# Patient Record
Sex: Female | Born: 1975
Health system: Southern US, Community
[De-identification: ages and names within clinical notes are randomized; demographics above are authoritative.]

## PROBLEM LIST (undated history)

## (undated) DIAGNOSIS — Z9109 Other allergy status, other than to drugs and biological substances: Secondary | ICD-10-CM

## (undated) DIAGNOSIS — K760 Fatty (change of) liver, not elsewhere classified: Secondary | ICD-10-CM

## (undated) DIAGNOSIS — Z79899 Other long term (current) drug therapy: Secondary | ICD-10-CM

## (undated) DIAGNOSIS — F329 Major depressive disorder, single episode, unspecified: Secondary | ICD-10-CM

## (undated) DIAGNOSIS — K219 Gastro-esophageal reflux disease without esophagitis: Secondary | ICD-10-CM

## (undated) DIAGNOSIS — F32A Depression, unspecified: Secondary | ICD-10-CM

## (undated) DIAGNOSIS — G8929 Other chronic pain: Secondary | ICD-10-CM

## (undated) DIAGNOSIS — A048 Other specified bacterial intestinal infections: Secondary | ICD-10-CM

## (undated) DIAGNOSIS — E1151 Type 2 diabetes mellitus with diabetic peripheral angiopathy without gangrene: Secondary | ICD-10-CM

## (undated) DIAGNOSIS — E7800 Pure hypercholesterolemia, unspecified: Secondary | ICD-10-CM

## (undated) DIAGNOSIS — I10 Essential (primary) hypertension: Secondary | ICD-10-CM

## (undated) DIAGNOSIS — E782 Mixed hyperlipidemia: Secondary | ICD-10-CM

## (undated) DIAGNOSIS — K2970 Gastritis, unspecified, without bleeding: Secondary | ICD-10-CM

## (undated) DIAGNOSIS — E559 Vitamin D deficiency, unspecified: Secondary | ICD-10-CM

## (undated) DIAGNOSIS — G4730 Sleep apnea, unspecified: Secondary | ICD-10-CM

## (undated) DIAGNOSIS — D573 Sickle-cell trait: Secondary | ICD-10-CM

## (undated) DIAGNOSIS — F413 Other mixed anxiety disorders: Secondary | ICD-10-CM

## (undated) DIAGNOSIS — I70209 Unspecified atherosclerosis of native arteries of extremities, unspecified extremity: Secondary | ICD-10-CM

## (undated) HISTORY — PX: APPENDECTOMY: SHX54

## (undated) HISTORY — DX: Sleep apnea, unspecified: G47.30

## (undated) HISTORY — DX: Vitamin D deficiency, unspecified: E55.9

## (undated) HISTORY — DX: Type 2 diabetes mellitus with diabetic peripheral angiopathy without gangrene: I70.209

## (undated) HISTORY — DX: Other mixed anxiety disorders: F41.3

## (undated) HISTORY — DX: Depression, unspecified: F32.A

## (undated) HISTORY — DX: Other specified bacterial intestinal infections: A04.8

## (undated) HISTORY — DX: Other chronic pain: G89.29

## (undated) HISTORY — DX: Fatty (change of) liver, not elsewhere classified: K76.0

## (undated) HISTORY — DX: Mixed hyperlipidemia: E78.2

## (undated) HISTORY — DX: Gastritis, unspecified, without bleeding: K29.70

## (undated) HISTORY — DX: Sickle-cell trait: D57.3

## (undated) HISTORY — DX: Type 2 diabetes mellitus with diabetic peripheral angiopathy without gangrene: E11.51

## (undated) HISTORY — DX: Pure hypercholesterolemia, unspecified: E78.00

## (undated) HISTORY — DX: Major depressive disorder, single episode, unspecified: F32.9

## (undated) HISTORY — DX: Gastro-esophageal reflux disease without esophagitis: K21.9

## (undated) HISTORY — PX: ABDOMINAL HYSTERECTOMY: SHX81

## (undated) HISTORY — DX: Other long term (current) drug therapy: Z79.899

## (undated) HISTORY — DX: Other allergy status, other than to drugs and biological substances: Z91.09

## (undated) HISTORY — DX: Essential (primary) hypertension: I10

---

## 1999-07-26 ENCOUNTER — Emergency Department (HOSPITAL_COMMUNITY): Admission: EM | Admit: 1999-07-26 | Discharge: 1999-07-26 | Payer: Self-pay | Admitting: Emergency Medicine

## 2006-11-06 ENCOUNTER — Ambulatory Visit: Payer: Self-pay | Admitting: *Deleted

## 2006-11-06 ENCOUNTER — Inpatient Hospital Stay (HOSPITAL_COMMUNITY): Admission: AD | Admit: 2006-11-06 | Discharge: 2006-11-10 | Payer: Self-pay | Admitting: *Deleted

## 2008-01-28 DIAGNOSIS — K2970 Gastritis, unspecified, without bleeding: Secondary | ICD-10-CM

## 2008-01-28 HISTORY — DX: Gastritis, unspecified, without bleeding: K29.70

## 2008-08-16 HISTORY — PX: COLONOSCOPY: SHX174

## 2008-08-16 HISTORY — PX: ESOPHAGOGASTRODUODENOSCOPY: SHX1529

## 2009-12-11 ENCOUNTER — Emergency Department (HOSPITAL_COMMUNITY): Admission: EM | Admit: 2009-12-11 | Discharge: 2009-12-11 | Payer: Self-pay | Admitting: Emergency Medicine

## 2010-04-09 LAB — DIFFERENTIAL
Eosinophils Absolute: 0.1 10*3/uL (ref 0.0–0.7)
Eosinophils Relative: 1 % (ref 0–5)
Lymphocytes Relative: 28 % (ref 12–46)
Lymphs Abs: 2.9 10*3/uL (ref 0.7–4.0)
Monocytes Relative: 4 % (ref 3–12)

## 2010-04-09 LAB — ETHANOL: Alcohol, Ethyl (B): <5 mg/dL (ref 0–10)

## 2010-04-09 LAB — URINALYSIS, ROUTINE W REFLEX MICROSCOPIC
Glucose, UA: NEGATIVE mg/dL
Hgb urine dipstick: NEGATIVE
Protein, ur: NEGATIVE mg/dL
Specific Gravity, Urine: 1.018 (ref 1.005–1.030)
Urobilinogen, UA: 0.2 mg/dL (ref 0.0–1.0)

## 2010-04-09 LAB — COMPREHENSIVE METABOLIC PANEL
ALT: 32 U/L (ref 0–35)
AST: 36 U/L (ref 0–37)
Calcium: 9.7 mg/dL (ref 8.4–10.5)
Creatinine, Ser: 0.66 mg/dL (ref 0.4–1.2)
GFR calc Af Amer: >60 mL/min (ref >60)
Sodium: 140 mEq/L (ref 135–145)
Total Protein: 7.7 g/dL (ref 6.0–8.3)

## 2010-04-09 LAB — RAPID URINE DRUG SCREEN, HOSP PERFORMED
Amphetamines: NOT DETECTED
Benzodiazepines: POSITIVE — AB
Cocaine: NOT DETECTED
Opiates: POSITIVE — AB
Tetrahydrocannabinol: POSITIVE — AB

## 2010-04-09 LAB — GLUCOSE, CAPILLARY: Glucose-Capillary: 158 mg/dL — ABNORMAL HIGH (ref 70–99)

## 2010-04-09 LAB — SALICYLATE LEVEL: Salicylate Lvl: <4 mg/dL (ref 2.8–20.0)

## 2010-04-09 LAB — CBC
MCHC: 34.4 g/dL (ref 30.0–36.0)
Platelets: 193 10*3/uL (ref 150–400)
RDW: 14.8 % (ref 11.5–15.5)

## 2010-04-09 LAB — URINE CULTURE

## 2010-04-09 LAB — ACETAMINOPHEN LEVEL: Acetaminophen (Tylenol), Serum: <10 ug/mL — ABNORMAL LOW (ref 10–30)

## 2010-04-09 LAB — TRICYCLICS SCREEN, URINE: TCA Scrn: NOT DETECTED
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
## 2010-06-14 NOTE — Discharge Summary (Signed)
NAMEKEAUNA, [name]:  [id number]   MEDICAL RECORD NO.:  [id number]          PATIENT TYPE:  IPS   LOCATION:  0603                          FACILITY:  [facility]   PHYSICIAN:  Sara [name], M.D. DATE OF BIRTH:  1975/08/09   DATE OF ADMISSION:  11/06/2006  DATE OF DISCHARGE:  11/10/2006                               DISCHARGE SUMMARY   IDENTIFYING INFORMATION:  This is a 35 year old divorced African  American female who was admitted on an involuntary basis on November 06, 2006.   HISTORY OF PRESENT ILLNESS:  The patient presented to the emergency  department at [facility] on the day before admission.  She was  actively suicidal.  She stated I have anxiety attacks, a bad attitude,  mood changes day to day, I need disability to get off marijuana, I do  not eat or sleep without marijuana, on talk to myself I hear no answer.  The father of her children has been in prison for the past 9-1/2 years  secondary to murder.  He is due to be released in November.  She wants  the father to take the children so she can leave.  She says I do not  get along with nobody, I am hateful.  She was currently living with her  parents and the only income she has is 106 dollars a month.  This is  child support for her 2 sons age 68 and 48.  She apparently takes care  for everyone.  She does cooking, cleaning, driving etc.  Although, she  currently has an upcoming court date on November 10, 2006, due to  speeding and there are issues regarding her driving.  She has had  admissions to [facility], February 1994, November  1995, and February 1999.  She has been in and out of [facility].  She has had hospitalizations in 1994, [facility] [place]  for a week that was for depression with suicidal ideation and fears of  harming her 61-year-old son.  She has been hospitalized 2 to 3 times at  [facility] for depression and suicidal ideation wanting  to hurt  others.  Her last hospitalization was 2006.  She has had some history of  cocaine abuse, longest period off cannabis is 1 week.  She has had a  history of taking Depakote.  It was irregularly and she had seizures.  Seroquel, Zoloft, Paxil, and Lexapro have been tried in the past.  The  patient's mother is bipolar as is maternal grandmother.  There is also a  family history of substance abuse.  She has asthma, gastroesophageal  reflux disease, arthritis, sick as cell trait, and she status post an  appendectomy and hysterectomy.  She states that she has been told she  has gout.  She is allergic to PENICILLIN, AMOXICILLIN and FLAGYL.  She  has no currently prescribed medications as she stopped everything.   PHYSICAL FINDINGS:  The patient was medically cleared in the ED at  [facility]  There were no acute physical or medical problems  noted.  Her UDS was positive for marijuana.  Other admission  laboratories were done in the ED and evaluated by the ED physician.   HOSPITAL COURSE:  Upon admission, the patient was continued on Pepcid 20  mg p.o. b.i.d.  She was also started on Cymbalta 20 mg p.o. q.a.m. and  q.h.s. and Abilify 10 mg at bedtime.  Motrin 800 mg q. 6h. p.r.n. pain.  On November 08, 2006, she was started on Seroquel 50 mg now then 50 mg  p.o. q. 4h. p.r.n. anxiety.  On November 09, 2006, Cymbalta was increased  to 30 mg p.o. b.i.d.  Also on November 09, 2006, she was started on  albuterol nebulizer q. 4h. p.r.n. allergy symptoms and Vistaril 25 mg q.  6h. p.r.n. allergic symptoms.  The patient tolerated these medications  well with no significant side effects.  Initially in session with me,  she was reserved with poor eye contact and psychomotor retardation.  Speech was soft and slow.  Her mood was angry and irritable.  She did  not want to participate at first in unit therapeutic groups and  activities, but this improved as hospitalization progressed.  She  stated  she has not been on regular medications.  She has been taking Xanax and  pain pills that belong to her mother.  She used to be Lexapro and  Seroquel.  She stated she stays with her mother and has conflict there.  She smokes marijuana regularly.  She wants long-term rehab to get off  the marijuana.  As hospitalization progressed, mood swings were still a  problem.  She was having difficulty sleeping.  She was irritable with  the peer and felt he was deliberately aggravating her.  By November 09, 2006, her mood was euthymic, affect wide range, she felt much better,  she was interested in going home, she did not want to look at a long-  term treatment program after Cymbalta was increased to 30 mg p.o. b.i.d.  as indicated above.  On November 10, 2006, mental status had improved  markedly from admission status.  The patient was friendly and  cooperative, good eye contact.  Speech was normal rate and flow.  Psychomotor activity was within normal limits.  Mood was euthymic.  Affect wide range.  There was no suicidal or homicidal ideation.  No  thoughts of self-injurious behavior.  No auditory or visual  hallucinations.  No paranoia or delusions.  Thoughts were logical and  goal-directed.  Thought content no predominant theme.  Cognitive was  grossly back to baseline.  The patient was felt ready to be discharged.  She planned to return home and have follow up at [facility].   DISCHARGE DIAGNOSES:  AXIS I:  Mood disorder not otherwise specified  also cannabis dependence.  AXIS II:  Borderline personality disorder.  AXIS III:  Asthma, gastroesophageal reflux disease, arthritis, rule out  gout.  AXIS IV: Severe (problems primary support group; problems with  education, occupation, housing, economics, and legal issues; burden of  psychiatric illness; burden of medical illness).  AXIS V:  Global  assessment of functioning was 50 upon discharge.  Global assessment of   functioning was 18 upon admission.  Global assessment of functioning  highest past year was 55-60.   DISCHARGE/PLANS:  There were no specific activity level or dietary  restrictions.   POST HOSPITAL CARE PLANS:  The patient will be seen at [facility] November 11, 2006, at 9 o'clock a.m.   DISCHARGE MEDICATIONS:  1. The patient was discharged on Cymbalta 30 mg p.o. b.i.d.  2. Abilify 10 mg at bedtime.  3. Ambien 10 mg at bedtime p.r.n. insomnia.  4. Cogentin 0.5 mg p.o. b.i.d. p.r.n.      Sara [name], M.D.  Electronically Signed     BHS/MEDQ  D:  12/07/2006  T:  12/08/2006  Job:  [id number]

## 2013-05-03 ENCOUNTER — Encounter: Payer: Self-pay | Admitting: Gastroenterology

## 2013-06-21 ENCOUNTER — Encounter: Payer: Self-pay | Admitting: Gastroenterology

## 2013-06-24 ENCOUNTER — Ambulatory Visit: Payer: Self-pay | Admitting: Gastroenterology

## 2013-08-03 ENCOUNTER — Encounter: Payer: Self-pay | Admitting: *Deleted

## 2013-08-04 ENCOUNTER — Encounter: Payer: Self-pay | Admitting: Nurse Practitioner

## 2013-08-04 ENCOUNTER — Ambulatory Visit (INDEPENDENT_AMBULATORY_CARE_PROVIDER_SITE_OTHER): Payer: Medicaid Other | Admitting: Nurse Practitioner

## 2013-08-04 VITALS — BP 102/76 | HR 84 | Ht 65.0 in | Wt 199.2 lb

## 2013-08-04 DIAGNOSIS — R112 Nausea with vomiting, unspecified: Secondary | ICD-10-CM

## 2013-08-04 DIAGNOSIS — R109 Unspecified abdominal pain: Secondary | ICD-10-CM

## 2013-08-04 NOTE — Patient Instructions (Signed)
We made you an appointment with Willette ClusterPaula Guenther NP-C on 08-17-2013 at 10 am.  We [facility].

## 2013-08-05 ENCOUNTER — Encounter: Payer: Self-pay | Admitting: Nurse Practitioner

## 2013-08-05 DIAGNOSIS — R109 Unspecified abdominal pain: Secondary | ICD-10-CM | POA: Insufficient documentation

## 2013-08-05 DIAGNOSIS — R112 Nausea with vomiting, unspecified: Secondary | ICD-10-CM | POA: Insufficient documentation

## 2013-08-05 NOTE — Progress Notes (Addendum)
HPI :  Patient is a 38 year old female, new to this practice, referred by PCP in [place] for evaluation of nausea, vomiting and sharp upper abdominal pain. Patient tells me she has chronic upper abdominal pain which radiates around both sides towards the back. She has apparently been.by a gastroenterologist in [place], I do not have results. Patient describes EGD and colonoscopy a few years back. She does not want to return to the gastroenterologist there. Patient also reports being hospitalized in [facility] in last week for the same symptoms. She describes a CT scan and a barium study, I do not have those results. Patient has a history of significant weight loss, 36 pounds in the last 2 months. She began having problems with nausea and vomiting 2 months ago. Prior to that no significant nausea vomiting except for back a few years back when she had workup in [place], [place].  Patient is status post appendectomy and total hysterectomy. She takes NSAIDs as needed but note from PCP states patient takes a daily Goody powder. She is on a daily PPI and takes phenergan for nausea.    Past Medical History  Diagnosis Date  . Hyperlipidemia, mixed   . Hypercholesterolemia   . Hypertension   . High risk medication use   . Chronic pain   . 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 Acid reflux   . Vitamin D deficiency   . Sleep apnea   . Sickle cell trait   . Pollen allergies   . Depression   . Other mixed anxiety disorders     Family History  Problem Relation Age of Onset  . Clotting disorder Mother   . Crohn's disease Mother   . Heart disease Mother   . Colon cancer Father   . Clotting disorder Brother   . Diabetes Brother   . Diabetes Maternal Aunt   . Liver cancer Maternal Uncle   . Prostate cancer Maternal Uncle   . Colon cancer Maternal Uncle   . Diabetes Paternal Aunt   . Esophageal cancer Paternal Uncle    History  Substance Use Topics  . Smoking  status: Current Every Day Smoker -- 1.00 packs/day    Types: Cigarettes  . Smokeless tobacco: Never Used  . Alcohol Use: No   Current Outpatient Prescriptions  Medication Sig Dispense Refill  . ALPRAZolam (XANAX) 1 MG tablet Take 1 mg by mouth at bedtime as needed for anxiety.      . Dapagliflozin Propanediol (FARXIGA) 10 MG TABS Take by mouth daily.      Marland Kitchen gabapentin (NEURONTIN) 600 MG tablet Take 600 mg by mouth 3 (three) times daily.      Marland Kitchen glucose blood test strip 1 each by Other route as needed for other. Use as instructed      . Insulin Glargine (LANTUS SOLOSTAR) 100 UNIT/ML Solostar Pen Inject into the skin daily at 10 pm. 10 units daily      . Insulin Pen Needle (PEN NEEDLES 29GX1/2") 29G X MISC by Does not apply route.      . Lancets (ACCU-CHEK SOFT TOUCH) lancets 1 each by Other route as needed for other. Use as instructed      . Naproxen Sodium (ALEVE) 220 MG CAPS Take 1 capsule by mouth daily. Take 2 caps 4 times daily as needed.      Marland Kitchen oxycodone (OXY-IR) 5 MG capsule Take 5 mg by mouth every 4 (four) hours as needed.      Marland Kitchen  pantoprazole (PROTONIX) 40 MG tablet Take 40 mg by mouth daily.      . promethazine (PHENERGAN) 25 MG tablet Take 25 mg by mouth every 6 (six) hours as needed for nausea or vomiting.      . sucralfate (CARAFATE) 1 GM/10ML suspension Take 1 g by mouth 4 (four) times daily -  with meals and at bedtime.      . traMADol (ULTRAM) 50 MG tablet Take 50 mg by mouth every 6 (six) hours as needed.       No current facility-administered medications for this visit.   Allergies  Allergen Reactions  . Metronidazole   . Penicillins   . Tetracyclines & Related   . Tylenol [Acetaminophen]   . Doxycycline Rash     Review of Systems: Positive for back pain, breast changes, vision changes, cough, fatigue, night sweats, headaches, sleeping problems, excessive thirst, and excessive urination. All other systems reviewed and negative except where noted in HPI.    Physical Exam: BP 102/76  Pulse 84  Ht 5\' 5"  (1.651 m)  Wt 199 lb 3.2 oz (90.357 kg)  BMI 33.15 kg/m2 Constitutional: Pleasant, obese black female in no acute distress. HEENT: Normocephalic and atraumatic. Conjunctivae are normal. No scleral icterus. Neck supple.  Cardiovascular: Normal rate, regular rhythm.  Pulmonary/chest: Effort normal and breath sounds normal. No wheezing, rales or rhonchi. Abdominal: Soft, obese, exaggerated response to even light palpation anywhere over abdomen. Bowel sounds active throughout. There are no masses palpable.  Extremities: no edema Lymphadenopathy: No cervical adenopathy noted. Neurological: Alert and oriented to person place and time. Skin: Skin is warm and dry. No rashes noted. Psychiatric: Normal mood and affect. Behavior is normal.   ASSESSMENT AND PLAN:  38 year old female with multiple medical problems, on multiple medications. She has chronic abdominal pain, evaluated by GI in [facility] a few years back. Patient does not want to return to the gastroenterology clinic in [place]. Patient was  hospitalized recently [facility] for abdominal pain, nausea, vomiting. She apparently had an inpatient CTscan and a barium study but I do not have any records. She has chronic GI issues and we need to have records to evaluate her. She is in no acute distress. Will await records and bring her back to go over everything.   Addendum: Reviewed and agree with initial management. Beverley FiedlerJay M Pyrtle, MD

## 2013-08-10 ENCOUNTER — Encounter: Payer: Self-pay | Admitting: *Deleted

## 2013-08-17 ENCOUNTER — Ambulatory Visit: Payer: Medicaid Other | Admitting: Nurse Practitioner

## 2013-08-31 ENCOUNTER — Ambulatory Visit (INDEPENDENT_AMBULATORY_CARE_PROVIDER_SITE_OTHER): Payer: Medicaid Other | Admitting: Nurse Practitioner

## 2013-08-31 ENCOUNTER — Encounter: Payer: Self-pay | Admitting: Nurse Practitioner

## 2013-08-31 VITALS — BP 118/80 | HR 94 | Ht 62.5 in | Wt 197.0 lb

## 2013-08-31 DIAGNOSIS — R109 Unspecified abdominal pain: Secondary | ICD-10-CM

## 2013-08-31 DIAGNOSIS — R1013 Epigastric pain: Secondary | ICD-10-CM

## 2013-08-31 MED ORDER — PANTOPRAZOLE SODIUM 40 MG PO TBEC: 40.0000 mg | DELAYED_RELEASE_TABLET | Freq: Every day | ORAL | Status: DC

## 2013-08-31 MED ORDER — SUCRALFATE 1 GM/10ML PO SUSP: 1.0000 g | Freq: Three times a day (TID) | ORAL | Status: DC

## 2013-08-31 NOTE — Patient Instructions (Addendum)
You have been given a separate informational sheet regarding your tobacco use, the importance of quitting and local resources to help you quit. You have been scheduled for an endoscopy. Please follow written instructions given to you at your visit today. If you use inhalers (even only as needed), please bring them with you on the day of your procedure. Your physician has requested that you go to www.startemmi.com and enter the access code given to you at your visit today. This web site gives a general overview about your procedure. However, you should still follow specific instructions given to you by our office regarding your preparation for the procedure.  We sent refills for the Carafate suspemsion to [facility], [place], [place]. Take the Protonix 40 mg 1 tab daily before breakfast.

## 2013-09-01 ENCOUNTER — Encounter: Payer: Self-pay | Admitting: Nurse Practitioner

## 2013-09-01 NOTE — Progress Notes (Addendum)
HPI :   Patient is a 38 year old female, new to this practice. Patient is in a few weeks ago for evaluation of abdominal pain but I have no records from previous gastroenterologist so she was rescheduled.  Patient was hospitalized at [facility] late last month for upper abdominal pain, nausea , vomiting. She had been taking NSAIDs . There was a question of hematemesis but patient reported having had a red slushy watermelon. Her hemoglobin was normal in the 16 range. On admission her white count was elevated however to 13.6. LFTs were normal. A CT scan of the abdomen and pelvis with contrast done 07/24/13 was unremarkable.  Patient was discharged home on Carafate and protonix.  In 2010 patient was seen by gastroenterologist in [place] for nausea and epigastric pain. EGD was normal. She also had a colonoscopy which was also unremarkable. Overall she had okay from a gastrointestinal standpoint until approximately 3 months ago when she developed this nausea and epigastric pain. Patient had been taking NSAIDs prior to the onset of pain. Patient is frustrated that she cannot get any help with this abdominal pain. She is tired of being miserable in pain. Patient would like a refill on her Carafate and Protonix.    Past Medical History  Diagnosis Date  . Hyperlipidemia, mixed   . Hypercholesterolemia   . Hypertension   . High risk medication use   . Chronic pain   . 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 Acid reflux   . Vitamin D deficiency   . Sleep apnea   . Sickle cell trait   . Pollen allergies   . Depression   . Other mixed anxiety disorders   . Gastritis 2010    Family History  Problem Relation Age of Onset  . Clotting disorder Mother   . Crohn's disease Mother   . Heart disease Mother   . Colon cancer Father   . Clotting disorder Brother   . Diabetes Brother   . Diabetes Maternal Aunt   . Liver cancer Maternal Uncle   . Prostate cancer  Maternal Uncle   . Colon cancer Maternal Uncle   . Diabetes Paternal Aunt   . Esophageal cancer Paternal Uncle    History  Substance Use Topics  . Smoking status: Current Every Day Smoker -- 1.00 packs/day    Types: Cigarettes  . Smokeless tobacco: Never Used  . Alcohol Use: No   Current Outpatient Prescriptions  Medication Sig Dispense Refill  . ALPRAZolam (XANAX) 1 MG tablet Take 1 mg by mouth at bedtime as needed for anxiety.      . Dapagliflozin Propanediol (FARXIGA) 10 MG TABS Take by mouth daily.      Marland Kitchen gabapentin (NEURONTIN) 600 MG tablet Take 600 mg by mouth 3 (three) times daily.      Marland Kitchen glucose blood test strip 1 each by Other route as needed for other. Use as instructed      . Insulin Glargine (LANTUS SOLOSTAR) 100 UNIT/ML Solostar Pen Inject into the skin daily at 10 pm. 10 units daily      . Insulin Pen Needle (PEN NEEDLES 29GX1/2") 29G X MISC by Does not apply route.      . Lancets (ACCU-CHEK SOFT TOUCH) lancets 1 each by Other route as needed for other. Use as instructed      . Naproxen Sodium (ALEVE) 220 MG CAPS Take 1 capsule by mouth daily. Take 2 caps 4 times daily as  needed.      Marland Kitchen. oxycodone (OXY-IR) 5 MG capsule Take 5 mg by mouth every 4 (four) hours as needed.      . pantoprazole (PROTONIX) 40 MG tablet Take 1 tablet (40 mg total) by mouth daily.  30 tablet  2  . promethazine (PHENERGAN) 25 MG tablet Take 25 mg by mouth every 6 (six) hours as needed for nausea or vomiting.      . sucralfate (CARAFATE) 1 GM/10ML suspension Take 10 mLs (1 g total) by mouth 4 (four) times daily -  with meals and at bedtime.  420 mL  2  . traMADol (ULTRAM) 50 MG tablet Take 50 mg by mouth every 6 (six) hours as needed.       No current facility-administered medications for this visit.   Allergies  Allergen Reactions  . Metronidazole   . Penicillins   . Tetracyclines & Related   . Tylenol [Acetaminophen]   . Doxycycline Rash    Review of Systems: All systems reviewed and  negative except where noted in HPI.   Physical Exam: BP 118/80  Pulse 94  Ht 5' 2.5" (1.588 m)  Wt 197 lb (89.359 kg)  BMI 35.44 kg/m2 Constitutional: well-developed, black female in no acute distress. HEENT: Normocephalic and atraumatic. Conjunctivae are normal. No scleral icterus. Neck supple.  Cardiovascular: Normal rate, regular rhythm.  Pulmonary/chest: Effort normal and breath sounds normal. No wheezing, rales or rhonchi. Abdominal: Soft, nondistended, mild epigastric and right upper quadrant tenderness. Bowel sounds active throughout. There are no masses palpable. No hepatomegaly. Extremities: no edema Lymphadenopathy: No cervical adenopathy noted. Neurological: Alert and oriented to person place and time. Skin: Skin is warm and dry. No rashes noted. Psychiatric: agitated.   ASSESSMENT AND PLAN:  631. 38 year old female with epigastric pain and nausea for 3 months duration. CT [facility] and [facility] last month. Her white count however was elevated at 13. Patient had been taking NSAIDs prior to onset of this pain. Etiology of pain unclear, possibly functional. Rule out peptic ulcer disease. Will schedule her for an upper endoscopy for further evaluation. The benefits, risks, and potential complications of EGD with possible biopsies were discussed with the patient and she agrees to proceed. Gallbladder unremarkable CT scan of EGD is negative she may need an ultrasound. Will refill PPI and Carafate per patient's request  2. Diabetes / sleep apnea / hypertension  Addendum: Reviewed and agree with initial management. Beverley FiedlerJay M Pyrtle, MD

## 2013-09-12 ENCOUNTER — Ambulatory Visit: Payer: Self-pay | Admitting: Gastroenterology

## 2013-10-04 ENCOUNTER — Encounter: Payer: Self-pay | Admitting: Gastroenterology

## 2013-10-04 ENCOUNTER — Ambulatory Visit (AMBULATORY_SURGERY_CENTER): Payer: Medicaid Other | Admitting: Gastroenterology

## 2013-10-04 VITALS — BP 159/107 | HR 94 | Temp 97.1°F | Resp 37 | Ht 62.5 in | Wt 197.0 lb

## 2013-10-04 DIAGNOSIS — K2990 Gastroduodenitis, unspecified, without bleeding: Secondary | ICD-10-CM

## 2013-10-04 DIAGNOSIS — K2960 Other gastritis without bleeding: Secondary | ICD-10-CM

## 2013-10-04 DIAGNOSIS — A048 Other specified bacterial intestinal infections: Secondary | ICD-10-CM

## 2013-10-04 DIAGNOSIS — R1013 Epigastric pain: Secondary | ICD-10-CM

## 2013-10-04 DIAGNOSIS — K2970 Gastritis, unspecified, without bleeding: Secondary | ICD-10-CM

## 2013-10-04 LAB — GLUCOSE, CAPILLARY
GLUCOSE-CAPILLARY: 184 mg/dL — AB (ref 70–99)
Glucose-Capillary: 201 mg/dL — ABNORMAL HIGH (ref 70–99)

## 2013-10-04 MED ORDER — SODIUM CHLORIDE 0.9 % IV SOLN: 500.0000 mL | INTRAVENOUS | Status: DC

## 2013-10-04 NOTE — Progress Notes (Signed)
Called to room to assist during endoscopic procedure.  Patient ID and intended procedure confirmed with present staff. Received instructions for my participation in the procedure from the performing physician.  

## 2013-10-04 NOTE — Patient Instructions (Signed)
YOU HAD AN ENDOSCOPIC PROCEDURE TODAY AT THE [facility]: Refer to the procedure report that was given to you for any specific questions about what was found during the examination.  If the procedure report does not answer your questions, please call your gastroenterologist to clarify.  If you requested that your care partner not be given the details of your procedure findings, then the procedure report has been included in a sealed envelope for you to review at your convenience later.  YOU SHOULD EXPECT: Some feelings of bloating in the abdomen. Passage of more gas than usual.  Walking can help get rid of the air that was put into your GI tract during the procedure and reduce the bloating.   DIET: Your first meal following the procedure should be a light meal and then it is ok to progress to your normal diet.  A half-sandwich or bowl of soup is an example of a good first meal.  Heavy or fried foods are harder to digest and may make you feel nauseous or bloated.  Likewise meals heavy in dairy and vegetables can cause extra gas to form and this can also increase the bloating.  Drink plenty of fluids but you should avoid alcoholic beverages for 24 hours.  ACTIVITY: Your care partner should take you home directly after the procedure.  You should plan to take it easy, moving slowly for the rest of the day.  You can resume normal activity the day after the procedure however you should NOT DRIVE or use heavy machinery for 24 hours (because of the sedation medicines used during the test).    SYMPTOMS TO REPORT IMMEDIATELY: A gastroenterologist can be reached at any hour.  During normal business hours, 8:30 AM to 5:00 PM Monday through Friday, call [phone].  After hours and on weekends, please call the GI answering service at [phone] who will take a message and have the physician on call contact you.   Following upper endoscopy (EGD)  Vomiting of blood or coffee ground material  New  chest pain or pain under the shoulder blades  Painful or persistently difficult swallowing  New shortness of breath  Fever of 100F or higher  Black, tarry-looking stools  FOLLOW UP: If any biopsies were taken you will be contacted by phone or by letter within the next 1-3 weeks.  Call your gastroenterologist if you have not heard about the biopsies in 3 weeks.  Our staff will call the home number listed on your records the next business day following your procedure to check on you and address any questions or concerns that you may have at that time regarding the information given to you following your procedure. This is a courtesy call and so if there is no answer at the home number and we have not heard from you through the emergency physician on call, we will assume that you have returned to your regular daily activities without incident.  SIGNATURES/CONFIDENTIALITY: You and/or your care partner have signed paperwork which will be entered into your electronic medical record.  These signatures attest to the fact that that the information above on your After Visit Summary has been reviewed and is understood.  Full responsibility of the confidentiality of this discharge information lies with you and/or your care-partner.  Await pathology  Continue your normal medications  Please read over handouts about gastritis and hiatal hernias  Please call Dr. Lauro Franklin office in the next few days to set up a follow up  appointment for 4 weeks

## 2013-10-04 NOTE — Progress Notes (Signed)
Report to PACU, RN, vss, BBS= Clear.  

## 2013-10-04 NOTE — Op Note (Signed)
[facility] [address] [place], [address]   ENDOSCOPY PROCEDURE REPORT  PATIENT: Sara, [name]  MR#: [id number] BIRTHDATE: Mar 14, 1975 , 37  yrs. old GENDER: Female ENDOSCOPIST: Meryl Dare, MD, [facility] PROCEDURE DATE:  10/04/2013 PROCEDURE:  EGD w/ biopsy ASA CLASS:     Class III INDICATIONS:  Epigastric pain. MEDICATIONS: MAC sedation, administered by CRNA and propofol (Diprivan)  IV TOPICAL ANESTHETIC: none DESCRIPTION OF PROCEDURE: After the risks benefits and alternatives of the procedure were thoroughly explained, informed consent was obtained.  The LB [id number] endoscope was introduced through the mouth and advanced to the second portion of the duodenum. Without limitations.  The instrument was slowly withdrawn as the mucosa was fully examined.    ESOPHAGUS: Normal mucosa was found. STOMACH: Mild, non erosive gastritis was found in the gastric body and gastric antrum.  Multiple biopsies were performed.   The stomach otherwise appeared normal. DUODENUM: The duodenal mucosa showed no abnormalities in the bulb and second portion of the duodenum.  Retroflexed views revealed a small hiatal hernia.     The scope was then withdrawn from the patient and the procedure completed.  COMPLICATIONS: There were no complications.  ENDOSCOPIC IMPRESSION: 1.    Gastritis in the gastric body and gastric antrum; multiple biopsies 2.    Small hiatal hernia  RECOMMENDATIONS: 1.  Await pathology results 2.  Continue PPI and Carafate 3.  OP follow-up in 4 weeks with Dr. Rhea Belton  eSigned:  Meryl Dare, MD, [facility] 10/04/2013 3:18 PM

## 2013-10-05 ENCOUNTER — Telehealth: Payer: Self-pay | Admitting: *Deleted

## 2013-10-05 NOTE — Telephone Encounter (Signed)
  Follow up Call-  Call back number 10/04/2013  Post procedure Call Back phone  # [phone]  Permission to leave phone message Yes     Patient questions:  Do you have a fever, pain , or abdominal swelling? No. Pain Score  0 *  Have you tolerated food without any problems? Yes.    Have you been able to return to your normal activities? Yes.    Do you have any questions about your discharge instructions: Diet   No. Medications  No. Follow up visit  No.  Do you have questions or concerns about your Care? No.  Actions: * If pain score is 4 or above: No action needed, pain <4.

## 2013-10-10 ENCOUNTER — Other Ambulatory Visit: Payer: Self-pay

## 2013-10-10 MED ORDER — CLARITHROMYCIN 500 MG PO TABS
500.0000 mg | ORAL_TABLET | Freq: Three times a day (TID) | ORAL | Status: DC
Start: 2013-10-10 — End: 2022-01-30

## 2013-10-10 MED ORDER — OMEPRAZOLE 20 MG PO CPDR: DELAYED_RELEASE_CAPSULE | ORAL | Status: DC

## 2013-10-15 ENCOUNTER — Telehealth: Payer: Self-pay | Admitting: Internal Medicine

## 2013-10-15 DIAGNOSIS — A048 Other specified bacterial intestinal infections: Secondary | ICD-10-CM

## 2013-10-15 NOTE — Telephone Encounter (Signed)
Severe itching since starting clarithromycin No rash but intense sxs bothering her  Advised she stop clarithromycin and we will call her 9/21 with updated plans

## 2013-10-16 NOTE — Telephone Encounter (Signed)
Will leave this decision her follow up appt with Dr. Rhea Belton. If it is not set up please arrange.

## 2013-10-17 NOTE — Telephone Encounter (Signed)
Dr. Rhea Belton patient had EGD with Dr. Russella Dar( apparent scheduling error), she has H. Pylori and was started on clarithromycin 500 mg TID and omeprazole BID.  She has an allergy to PCN, metronidazole, and tetracycline. Called this weekend with intense itching from clarithromycin.  See notes below.  She has follow up with you in November.  Please advise on h pylori treatment or do you want her seen earlier in an overbook spot?

## 2013-10-17 NOTE — Telephone Encounter (Signed)
No, not okay for amox. Would refer to ID for help with antibiotic choices Very difficult to select treatment

## 2013-10-17 NOTE — Telephone Encounter (Signed)
She has allergies to nearly every possible H. pylori combination We could try twice daily PPI, bismuth subsalicylate 525 mg 4 times daily, and amoxicillin 1 g twice a day for 10 days Confirmation of eradication with H. pylori stool antigen off PPI is recommended 6 weeks after therapy

## 2013-10-17 NOTE — Telephone Encounter (Signed)
She is allergic to PCN sure that is ok??

## 2013-10-18 NOTE — Telephone Encounter (Signed)
Patient notified that she will be contacted by ID for an appt

## 2013-10-18 NOTE — Addendum Note (Signed)
Addended by: Annett Fabian on: 10/18/2013 09:15 AM   Modules accepted: Orders

## 2013-10-18 NOTE — Telephone Encounter (Signed)
Referral placed.  °Left message for patient to call back. ° °

## 2013-10-19 NOTE — Telephone Encounter (Signed)
Patient is scheduled to see Dr. Ninetta Lights with ID.  Appt was scheduled by ID with the patient

## 2013-10-27 ENCOUNTER — Ambulatory Visit: Payer: Medicaid Other | Admitting: Internal Medicine

## 2013-11-07 ENCOUNTER — Ambulatory Visit: Payer: Medicaid Other | Admitting: Infectious Diseases

## 2013-11-28 DIAGNOSIS — B9681 Helicobacter pylori [H. pylori] as the cause of diseases classified elsewhere: Secondary | ICD-10-CM | POA: Insufficient documentation

## 2013-11-28 DIAGNOSIS — K2970 Gastritis, unspecified, without bleeding: Principal | ICD-10-CM

## 2013-11-29 ENCOUNTER — Ambulatory Visit: Payer: Medicaid Other | Admitting: Internal Medicine

## 2013-11-29 ENCOUNTER — Telehealth: Payer: Self-pay | Admitting: *Deleted

## 2013-11-29 NOTE — Telephone Encounter (Signed)
Pt does not [place] transportation [place] [facility] [place] MD consult.

## 2013-11-29 NOTE — Telephone Encounter (Signed)
[place] [facility].  Nothing any closer to [facility].

## 2013-11-29 NOTE — Telephone Encounter (Signed)
All I can do is recommend ID consult given multiple antibiotic allergies and history of H. Pylori infection which needs to be treated

## 2013-11-29 NOTE — Telephone Encounter (Signed)
Is there an option for her to see ID closer to home for help with abx for h pylori?

## 2013-11-30 NOTE — Telephone Encounter (Signed)
Do you want her referred to ID at [facility]?

## 2013-11-30 NOTE — Telephone Encounter (Signed)
Left message for patient to call back  

## 2013-11-30 NOTE — Telephone Encounter (Signed)
Yes if she is willing and able to go

## 2013-12-01 NOTE — Telephone Encounter (Signed)
Left message for patient to call back  

## 2013-12-02 NOTE — Telephone Encounter (Signed)
No return call from the patient.  I have left another message asking her to call and discuss possible ID appt at [facility].

## 2013-12-09 ENCOUNTER — Encounter: Payer: Self-pay | Admitting: *Deleted

## 2013-12-15 ENCOUNTER — Ambulatory Visit: Payer: Medicaid Other | Admitting: Internal Medicine

## 2019-11-11 DIAGNOSIS — I119 Hypertensive heart disease without heart failure: Secondary | ICD-10-CM

## 2019-11-11 DIAGNOSIS — R079 Chest pain, unspecified: Secondary | ICD-10-CM

## 2019-11-11 DIAGNOSIS — E119 Type 2 diabetes mellitus without complications: Secondary | ICD-10-CM

## 2019-11-11 DIAGNOSIS — F17200 Nicotine dependence, unspecified, uncomplicated: Secondary | ICD-10-CM

## 2020-03-06 ENCOUNTER — Encounter (HOSPITAL_BASED_OUTPATIENT_CLINIC_OR_DEPARTMENT_OTHER): Payer: Self-pay | Admitting: Emergency Medicine

## 2020-03-06 ENCOUNTER — Other Ambulatory Visit: Payer: Self-pay

## 2020-03-06 ENCOUNTER — Emergency Department (HOSPITAL_BASED_OUTPATIENT_CLINIC_OR_DEPARTMENT_OTHER): Payer: Medicaid Other

## 2020-03-06 ENCOUNTER — Emergency Department (HOSPITAL_BASED_OUTPATIENT_CLINIC_OR_DEPARTMENT_OTHER)
Admission: EM | Admit: 2020-03-06 | Discharge: 2020-03-06 | Disposition: A | Discharge status: Home / Self Care | Payer: Medicaid Other | Attending: Emergency Medicine | Admitting: Emergency Medicine

## 2020-03-06 ENCOUNTER — Other Ambulatory Visit (HOSPITAL_BASED_OUTPATIENT_CLINIC_OR_DEPARTMENT_OTHER): Payer: Self-pay | Admitting: Emergency Medicine

## 2020-03-06 DIAGNOSIS — E1151 Type 2 diabetes mellitus with diabetic peripheral angiopathy without gangrene: Secondary | ICD-10-CM | POA: Diagnosis not present

## 2020-03-06 DIAGNOSIS — R110 Nausea: Secondary | ICD-10-CM | POA: Insufficient documentation

## 2020-03-06 DIAGNOSIS — Z794 Long term (current) use of insulin: Secondary | ICD-10-CM | POA: Diagnosis not present

## 2020-03-06 DIAGNOSIS — R197 Diarrhea, unspecified: Secondary | ICD-10-CM | POA: Diagnosis not present

## 2020-03-06 DIAGNOSIS — A599 Trichomoniasis, unspecified: Secondary | ICD-10-CM | POA: Diagnosis not present

## 2020-03-06 DIAGNOSIS — I10 Essential (primary) hypertension: Secondary | ICD-10-CM | POA: Diagnosis not present

## 2020-03-06 DIAGNOSIS — R000 Tachycardia, unspecified: Secondary | ICD-10-CM | POA: Insufficient documentation

## 2020-03-06 DIAGNOSIS — F17210 Nicotine dependence, cigarettes, uncomplicated: Secondary | ICD-10-CM | POA: Diagnosis not present

## 2020-03-06 DIAGNOSIS — R1032 Left lower quadrant pain: Secondary | ICD-10-CM | POA: Diagnosis present

## 2020-03-06 DIAGNOSIS — E1165 Type 2 diabetes mellitus with hyperglycemia: Secondary | ICD-10-CM | POA: Insufficient documentation

## 2020-03-06 LAB — CBC WITH DIFFERENTIAL/PLATELET
Abs Immature Granulocytes: 0.03 10*3/uL (ref 0.00–0.07)
Basophils Absolute: 0 10*3/uL (ref 0.0–0.1)
Basophils Relative: 0 %
Eosinophils Absolute: 0 10*3/uL (ref 0.0–0.5)
Eosinophils Relative: 0 %
HCT: 44.6 % (ref 36.0–46.0)
Hemoglobin: 15.4 g/dL — ABNORMAL HIGH (ref 12.0–15.0)
Immature Granulocytes: 0 %
Lymphocytes Relative: 10 %
Lymphs Abs: 0.9 10*3/uL (ref 0.7–4.0)
MCH: 29 pg (ref 26.0–34.0)
MCHC: 34.5 g/dL (ref 30.0–36.0)
MCV: 84 fL (ref 80.0–100.0)
Monocytes Absolute: 0.4 10*3/uL (ref 0.1–1.0)
Monocytes Relative: 5 %
Neutro Abs: 8.1 10*3/uL — ABNORMAL HIGH (ref 1.7–7.7)
Neutrophils Relative %: 85 %
Platelets: 189 10*3/uL (ref 150–400)
RBC: 5.31 MIL/uL — ABNORMAL HIGH (ref 3.87–5.11)
RDW: 12.4 % (ref 11.5–15.5)
WBC: 9.5 10*3/uL (ref 4.0–10.5)
nRBC: 0 % (ref 0.0–0.2)

## 2020-03-06 LAB — URINALYSIS, MICROSCOPIC (REFLEX)

## 2020-03-06 LAB — COMPREHENSIVE METABOLIC PANEL
ALT: 25 U/L (ref 0–44)
AST: 23 U/L (ref 15–41)
Albumin: 4 g/dL (ref 3.5–5.0)
Alkaline Phosphatase: 68 U/L (ref 38–126)
Anion gap: 11 (ref 5–15)
BUN: 10 mg/dL (ref 6–20)
CO2: 21 mmol/L — ABNORMAL LOW (ref 22–32)
Calcium: 9.2 mg/dL (ref 8.9–10.3)
Chloride: 104 mmol/L (ref 98–111)
Creatinine, Ser: 0.64 mg/dL (ref 0.44–1.00)
GFR, Estimated: >60 mL/min (ref >60)
Glucose, Bld: 330 mg/dL — ABNORMAL HIGH (ref 70–99)
Potassium: 3.9 mmol/L (ref 3.5–5.1)
Sodium: 136 mmol/L (ref 135–145)
Total Bilirubin: 0.7 mg/dL (ref 0.3–1.2)
Total Protein: 7.7 g/dL (ref 6.5–8.1)

## 2020-03-06 LAB — URINALYSIS, ROUTINE W REFLEX MICROSCOPIC
Bilirubin Urine: NEGATIVE
Glucose, UA: >=500 mg/dL — AB
Hgb urine dipstick: NEGATIVE
Ketones, ur: 40 mg/dL — AB
Leukocytes,Ua: NEGATIVE
Nitrite: NEGATIVE
Protein, ur: NEGATIVE mg/dL
Specific Gravity, Urine: 1.01 (ref 1.005–1.030)
pH: 5 (ref 5.0–8.0)

## 2020-03-06 LAB — LIPASE, BLOOD: Lipase: 25 U/L (ref 11–51)

## 2020-03-06 LAB — HIV ANTIBODY (ROUTINE TESTING W REFLEX): HIV Screen 4th Generation wRfx: NONREACTIVE

## 2020-03-06 LAB — CBG MONITORING, ED: Glucose-Capillary: 320 mg/dL — ABNORMAL HIGH (ref 70–99)

## 2020-03-06 MED ORDER — METOCLOPRAMIDE HCL 5 MG/ML IJ SOLN
5.0000 mg | Freq: Once | INTRAMUSCULAR | Status: AC
  Administered 2020-03-06: 5 mg via INTRAVENOUS
  Filled 2020-03-06: qty 2

## 2020-03-06 MED ORDER — IOHEXOL 300 MG/ML  SOLN
100.0000 mL | Freq: Once | INTRAMUSCULAR | Status: AC | PRN
  Administered 2020-03-06: 100 mL via INTRAVENOUS

## 2020-03-06 MED ORDER — SODIUM CHLORIDE 0.9 % IV BOLUS
1000.0000 mL | Freq: Once | INTRAVENOUS | Status: AC
  Administered 2020-03-06: 1000 mL via INTRAVENOUS

## 2020-03-06 MED ORDER — MORPHINE SULFATE (PF) 4 MG/ML IV SOLN
4.0000 mg | Freq: Once | INTRAVENOUS | Status: AC
Start: 2020-03-06 — End: 2020-03-06
  Administered 2020-03-06: 4 mg via INTRAVENOUS
  Filled 2020-03-06: qty 1

## 2020-03-06 MED ORDER — METRONIDAZOLE 500 MG PO TABS
2000.0000 mg | ORAL_TABLET | Freq: Once | ORAL | Status: AC
  Administered 2020-03-06: 2000 mg via ORAL
  Filled 2020-03-06: qty 4

## 2020-03-06 MED ORDER — AZITHROMYCIN 250 MG PO TABS
1000.0000 mg | ORAL_TABLET | Freq: Once | ORAL | Status: AC
  Administered 2020-03-06: 1000 mg via ORAL
  Filled 2020-03-06: qty 4

## 2020-03-06 MED ORDER — ONDANSETRON HCL 4 MG/2ML IJ SOLN
4.0000 mg | Freq: Once | INTRAMUSCULAR | Status: AC
  Administered 2020-03-06: 4 mg via INTRAVENOUS
  Filled 2020-03-06: qty 2

## 2020-03-06 MED ORDER — ONDANSETRON 4 MG PO TBDP: ORAL_TABLET | ORAL | 0 refills | Status: DC

## 2020-03-06 MED ORDER — SODIUM CHLORIDE 0.9 % IV SOLN
1.0000 g | Freq: Once | INTRAVENOUS | Status: AC
  Administered 2020-03-06: 1 g via INTRAVENOUS
  Filled 2020-03-06: qty 10

## 2020-03-06 MED FILL — ONDANSETRON ODT 4 MG TABLET: 4 | 5 days supply | Qty: 20 | Fill #0

## 2020-03-06 NOTE — Discharge Instructions (Signed)
Follow up with your family doc. you can take Imodium for diarrhea.  Please return for worsening abdominal pain or fever.

## 2020-03-06 NOTE — ED Provider Notes (Signed)
[facility] EMERGENCY DEPARTMENT Provider Note   CSN: [id number] Arrival date & time: 03/06/20  2831     History Chief Complaint  Patient presents with  . Abdominal Pain  . Hyperglycemia    Sara [name] is a 45 y.o. female.  45 yo F with a chief complaint of diffuse abdominal pain that she feels like is worse to the epigastrium.  Is been going on for couple weeks now.  Also has been having profuse diarrhea since this morning.  States that she is unable to control her bowels.  Has been having some nausea with this as well.  No fevers.  Was seen at an outside hospital yesterday for the same.  She denies trauma.  Has not been able to have access to her medications for some time.  Was chronically on opiates for chronic back pain but has been out for about 6 months.  The history is provided by the patient.  Abdominal Pain Pain location:  Generalized Pain quality: aching   Pain radiates to:  Does not radiate Pain severity:  Moderate Onset quality:  Gradual Duration:  2 weeks Timing:  Intermittent Progression:  Waxing and waning Chronicity:  New Relieved by:  Nothing Worsened by:  Nothing Ineffective treatments:  None tried Associated symptoms: diarrhea and nausea   Associated symptoms: no chest pain, no chills, no dysuria, no fever, no shortness of breath and no vomiting   Hyperglycemia Associated symptoms: abdominal pain and nausea   Associated symptoms: no chest pain, no dizziness, no dysuria, no fever, no shortness of breath and no vomiting        Past Medical History:  Diagnosis Date  . Acid reflux   . Chronic pain   . Depression   . 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 Fatty liver   . Gastritis 2010  . Gastritis   . H. pylori infection   . High risk medication use   . Hypercholesterolemia   . Hyperlipidemia, mixed   . Hypertension   . Other mixed anxiety disorders   . Pollen allergies   . Sickle cell trait (HCC)    . Sleep apnea   . Vitamin D deficiency     Patient Active Problem List   Diagnosis Date Noted  . Helicobacter pylori gastritis 11/28/2013  . Nausea with vomiting 08/05/2013  . Abdominal pain, unspecified site 08/05/2013    Past Surgical History:  Procedure Laterality Date  . ABDOMINAL HYSTERECTOMY    . APPENDECTOMY    . COLONOSCOPY  08-16-2008   Dr. Vallarie Mare  . ESOPHAGOGASTRODUODENOSCOPY  08-16-2008   Dr. Rayfield Citizen      OB History   No obstetric history on file.     Family History  Problem Relation Age of Onset  . Clotting disorder Mother   . Crohn's disease Mother   . Heart disease Mother   . Colon cancer Father   . Clotting disorder Brother   . Diabetes Brother   . Diabetes Maternal Aunt   . Liver cancer Maternal Uncle   . Prostate cancer Maternal Uncle   . Colon cancer Maternal Uncle   . Diabetes Paternal Aunt   . Esophageal cancer Paternal Uncle     Social History   Tobacco Use  . Smoking status: Current Every Day Smoker    Packs/day: 1.00    Types: Cigarettes  . Smokeless tobacco: Never Used  Substance Use Topics  . Drug use: Yes    Types:  Marijuana    Home Medications Prior to Admission medications   Medication Sig Start Date End Date Taking? Authorizing Provider  ondansetron (ZOFRAN ODT) 4 MG disintegrating tablet 4mg  ODT q4 hours prn nausea/vomit 03/06/20  Yes Melene Plan, DO  ALPRAZolam Prudy Feeler) 1 MG tablet Take 1 mg by mouth at bedtime as needed for anxiety.    [provider]  clarithromycin (BIAXIN) 500 MG tablet Take 1 tablet (500 mg total) by mouth 3 (three) times daily. 10/10/13   Meryl Dare, MD  Dapagliflozin Propanediol (FARXIGA) 10 MG TABS Take by mouth daily.    [provider]  gabapentin (NEURONTIN) 600 MG tablet Take 600 mg by mouth 3 (three) times daily.    [provider]  glucose blood test strip 1 each by Other route as needed for other. Use as instructed    [provider]  Insulin  Glargine (LANTUS SOLOSTAR) 100 UNIT/ML Solostar Pen Inject into the skin daily at 10 pm. 10 units daily    [provider]  Insulin Pen Needle (PEN NEEDLES 29GX1/2") 29G X MISC by Does not apply route.    [provider]  Lancets (ACCU-CHEK SOFT TOUCH) lancets 1 each by Other route as needed for other. Use as instructed    [provider]  Naproxen Sodium (ALEVE) 220 MG CAPS Take 1 capsule by mouth daily. Take 2 caps 4 times daily as needed.    [provider]  omeprazole (PRILOSEC) 20 MG capsule For 10 days 10/10/13   Meryl Dare, MD  oxycodone (OXY-IR) 5 MG capsule Take 5 mg by mouth every 4 (four) hours as needed.    [provider]  pantoprazole (PROTONIX) 40 MG tablet Take 1 tablet (40 mg total) by mouth daily. 08/31/13   Meredith Pel, NP  promethazine (PHENERGAN) 25 MG tablet Take 25 mg by mouth every 6 (six) hours as needed for nausea or vomiting.    [provider]  sucralfate (CARAFATE) 1 GM/10ML suspension Take 10 mLs (1 g total) by mouth 4 (four) times daily -  with meals and at bedtime. 08/31/13   Meredith Pel, NP  traMADol (ULTRAM) 50 MG tablet Take 50 mg by mouth every 6 (six) hours as needed.    [provider]    Allergies    Clarithromycin, Metronidazole, Penicillins, Tetracyclines & related, Tylenol [acetaminophen], and Doxycycline  Review of Systems   Review of Systems  Constitutional: Negative for chills and fever.  HENT: Negative for congestion and rhinorrhea.   Eyes: Negative for redness and visual disturbance.  Respiratory: Negative for shortness of breath and wheezing.   Cardiovascular: Negative for chest pain and palpitations.  Gastrointestinal: Positive for abdominal pain, diarrhea and nausea. Negative for vomiting.  Genitourinary: Negative for dysuria and urgency.  Musculoskeletal: Negative for arthralgias and myalgias.  Skin: Negative for pallor and wound.  Neurological: Negative for  dizziness and headaches.    Physical Exam Updated Vital Signs BP 135/84 (BP Location: Left Arm)   Pulse (!) 115   Temp 98.1 F (36.7 C) (Oral)   Resp 18   Ht 5\' 4"  (1.626 m)   Wt 74.8 kg   SpO2 100%   BMI 28.32 kg/m   Physical Exam Vitals and nursing note reviewed.  Constitutional:      General: She is not in acute distress.    Appearance: She is well-developed and well-nourished. She is not diaphoretic.  HENT:     Head: Normocephalic and atraumatic.  Eyes:     Extraocular Movements: EOM normal.     Pupils: Pupils are equal, round, and reactive to light.  Cardiovascular:     Rate and Rhythm: Regular rhythm. Tachycardia present.     Heart sounds: No murmur heard. No friction rub. No gallop.   Pulmonary:     Effort: Pulmonary effort is normal.     Breath sounds: No wheezing or rales.  Abdominal:     General: There is no distension.     Palpations: Abdomen is soft.     Tenderness: There is abdominal tenderness.     Comments: Abdominal tenderness diffusely worse in the left lower quadrant for me.  Musculoskeletal:        General: No tenderness or edema.     Cervical back: Normal range of motion and neck supple.  Skin:    General: Skin is warm and dry.  Neurological:     Mental Status: She is alert and oriented to person, place, and time.  Psychiatric:        Mood and Affect: Mood and affect normal.        Behavior: Behavior normal.     ED Results / Procedures / Treatments   Labs (all labs ordered are listed, but only abnormal results are displayed) Labs Reviewed  CBC WITH DIFFERENTIAL/PLATELET - Abnormal; Notable for the following components:      Result Value   RBC 5.31 (*)    Hemoglobin 15.4 (*)    Neutro Abs 8.1 (*)    All other components within normal limits  COMPREHENSIVE METABOLIC PANEL - Abnormal; Notable for the following components:   CO2 21 (*)    Glucose, Bld 330 (*)    All other components within normal limits  URINALYSIS, ROUTINE W REFLEX  MICROSCOPIC - Abnormal; Notable for the following components:   Glucose, UA >=500 (*)    Ketones, ur 40 (*)    All other components within normal limits  URINALYSIS, MICROSCOPIC (REFLEX) - Abnormal; Notable for the following components:   Bacteria, UA RARE (*)    Trichomonas, UA PRESENT (*)    All other components within normal limits  CBG MONITORING, ED - Abnormal; Notable for the following components:   Glucose-Capillary 320 (*)    All other components within normal limits  LIPASE, BLOOD  RPR  HIV ANTIBODY (ROUTINE TESTING W REFLEX)  CBG MONITORING, ED  GC/CHLAMYDIA PROBE AMP ([place]) NOT AT [facility]    EKG None  Radiology CT ABDOMEN PELVIS W CONTRAST  Result Date: 03/06/2020 CLINICAL DATA:  Diverticulitis suspected. Abdominal pain and diarrhea. Hyperglycemia. EXAM: CT ABDOMEN AND PELVIS WITH CONTRAST TECHNIQUE: Multidetector CT imaging of the abdomen and pelvis was performed using the standard protocol following bolus administration of intravenous contrast. CONTRAST:  OMNIPAQUE IOHEXOL 300 MG/ML  SOLN COMPARISON:  CT of the abdomen pelvis 07/24/2013 FINDINGS: Lower chest: The lung bases are clear without focal nodule, mass, or airspace disease. Heart size is normal. No significant pleural or pericardial effusion is present. Hepatobiliary: Mild diffuse fatty infiltration of the liver is again seen. No discrete lesions are present. The common bile duct and gallbladder are within normal limits. Pancreas: Unremarkable. No pancreatic ductal dilatation or surrounding inflammatory changes. Spleen: Normal in size without focal abnormality. Adrenals/Urinary Tract: Adrenal glands are normal bilaterally. Central sinus cyst of the right kidney is stable. No stone or mass lesion is present. No obstruction is present. Ureters are within normal limits. The urinary bladder is unremarkable. Stomach/Bowel: The stomach  duodenum are within normal limits. Small bowel is unremarkable. Terminal ileum is  within normal limits. The appendix is not discretely visualized and may be surgically absent. The ascending and transverse colon are within normal limits. Descending and sigmoid colon are normal. Vascular/Lymphatic: Atherosclerotic calcifications have progressed in the abdominal aorta branch vessels. No aneurysm is present. Reproductive: Status post hysterectomy. No adnexal masses. Other: A paraumbilical hernia contains fat without bowel. There is no significant interval change. Fat herniates into the inguinal canals bilaterally without significant change. No new ventral hernia is present. No significant free fluid or free air is present. Musculoskeletal: Vertebral body heights and alignment are normal. No focal lytic or blastic lesions are present. Bony pelvis is normal. Hips are located and within normal limits. IMPRESSION: 1. No acute or focal lesion to explain the patient's symptoms. 2. Hepatic steatosis. 3. Stable paraumbilical hernia contains fat without bowel. 4. Fat herniates into the inguinal canals bilaterally without significant change. 5. Aortic Atherosclerosis (ICD10-I70.0). Electronically Signed   By: Marin Roberts M.D.   On: 03/06/2020 10:33    Procedures Procedures   Medications Ordered in ED Medications  cefTRIAXone (ROCEPHIN) 1 g in sodium chloride 0.9 % 100 mL IVPB (1 g Intravenous New Bag/Given 03/06/20 1142)  sodium chloride 0.9 % bolus 1,000 mL (1,000 mLs Intravenous New Bag/Given 03/06/20 0921)  morphine 4 MG/ML injection 4 mg (4 mg Intravenous Given 03/06/20 0921)  ondansetron (ZOFRAN) injection 4 mg (4 mg Intravenous Given 03/06/20 0922)  iohexol (OMNIPAQUE) 300 MG/ML solution 100 mL (100 mLs Intravenous Contrast Given 03/06/20 0955)  azithromycin (ZITHROMAX) tablet 1,000 mg (1,000 mg Oral Given 03/06/20 1129)  metroNIDAZOLE (FLAGYL) tablet 2,000 mg (2,000 mg Oral Given 03/06/20 1129)  metoCLOPramide (REGLAN) injection 5 mg (5 mg Intravenous Given 03/06/20 1134)    ED Course  I  have reviewed the triage vital signs and the nursing notes.  Pertinent labs & imaging results that were available during my care of the patient were reviewed by me and considered in my medical decision making (see chart for details).    MDM Rules/Calculators/A&P                          45 yo F with a chief complaints of abdominal pain.  Going on for couple weeks.  Worsening over the past couple days.  She describes it worse to the epigastrium though on exam it is worse in the left lower quadrant.  That coupled with diarrhea and tachycardia will obtain a CT scan.  Blood work.  Pain and nausea medicine.  Reassess.  Patient feeling better on reassessment.  Tolerating p.o.  Patient found to have trichomonas on her urine sample.  CT scan without acute abdominal pathology.  Will treat for STDs.  PCP follow-up.  12:10 PM:  I have discussed the diagnosis/risks/treatment options with the patient and believe the pt to be eligible for discharge home to follow-up with PCP. We also discussed returning to the ED immediately if new or worsening sx occur. We discussed the sx which are most concerning (e.g., sudden worsening pain, fever, inability to tolerate by mouth) that necessitate immediate return. Medications administered to the patient during their visit and any new prescriptions provided to the patient are listed below.  Medications given during this visit Medications  cefTRIAXone (ROCEPHIN) 1 g in sodium chloride 0.9 % 100 mL IVPB (1 g Intravenous New Bag/Given 03/06/20 1142)  sodium chloride 0.9 % bolus 1,000 mL (1,000 mLs  Intravenous New Bag/Given 03/06/20 0921)  morphine 4 MG/ML injection 4 mg (4 mg Intravenous Given 03/06/20 0921)  ondansetron (ZOFRAN) injection 4 mg (4 mg Intravenous Given 03/06/20 0922)  iohexol (OMNIPAQUE) 300 MG/ML solution 100 mL (100 mLs Intravenous Contrast Given 03/06/20 0955)  azithromycin (ZITHROMAX) tablet 1,000 mg (1,000 mg Oral Given 03/06/20 1129)  metroNIDAZOLE (FLAGYL) tablet  2,000 mg (2,000 mg Oral Given 03/06/20 1129)  metoCLOPramide (REGLAN) injection 5 mg (5 mg Intravenous Given 03/06/20 1134)     The patient appears reasonably screen and/or stabilized for discharge and I doubt any other medical condition or other [facility] requiring further screening, evaluation, or treatment in the ED at this time prior to discharge.   Final Clinical Impression(s) / ED Diagnoses Final diagnoses:  Trichimoniasis    Rx / DC Orders ED Discharge Orders         Ordered    ondansetron (ZOFRAN ODT) 4 MG disintegrating tablet        03/06/20 1205           Melene PlanFloyd, Hughey Rittenberry, DO 03/06/20 1210

## 2020-03-06 NOTE — ED Triage Notes (Signed)
States was seen at [facility] yesterday for cbg over 500 had abd pain also was given fluids and insulin brought down to 170 and sent home , pt presents now via ems with abd pain and diarrhea and cbg 383

## 2020-03-06 NOTE — ED Notes (Signed)
Pt given cab voucher understands it is one per year

## 2020-03-07 LAB — GC/CHLAMYDIA PROBE AMP (~~LOC~~) NOT AT ARMC
Chlamydia: NEGATIVE
Comment: NEGATIVE
Comment: NORMAL
Neisseria Gonorrhea: NEGATIVE

## 2020-03-07 LAB — RPR: RPR Ser Ql: NONREACTIVE

## 2021-12-28 IMAGING — CT CT ABD-PELV W/ CM
2 of 5 series · 15 of 46 positions shown, 17 images · IV contrast (Omnipaque)
Comparison: CT of the abdomen pelvis 07/24/2013

CLINICAL DATA: Diverticulitis suspected. Abdominal pain and
diarrhea. Hyperglycemia.

EXAM:
CT ABDOMEN AND PELVIS WITH CONTRAST
TECHNIQUE: Multidetector CT imaging of the abdomen and pelvis was performed
using the standard protocol following bolus administration of
intravenous contrast.
CONTRAST:  100mL OMNIPAQUE IOHEXOL 300 MG/ML  SOLN

[Series 2: axial st · axial · 0.96mm/px · z∈[-442,-26]mm · 12 of 93 slices shown, 14 images]
[im 5/93  soft-tissue]
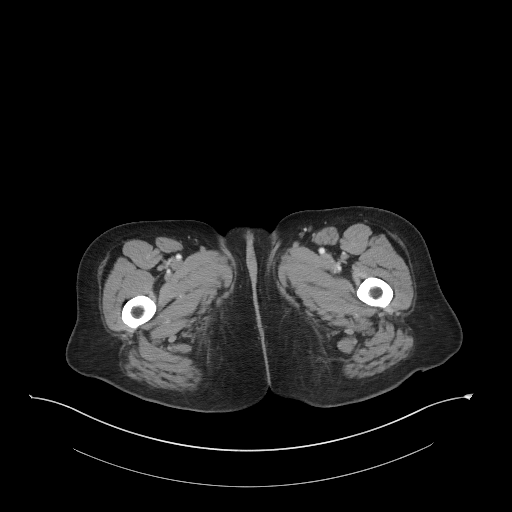
[im 5/93  bone]
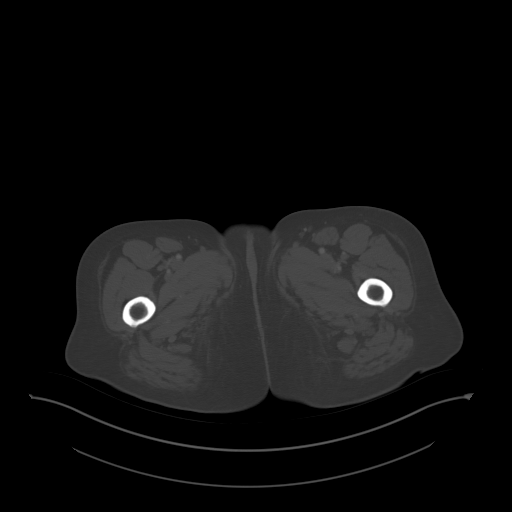
[im 15/93  soft-tissue]
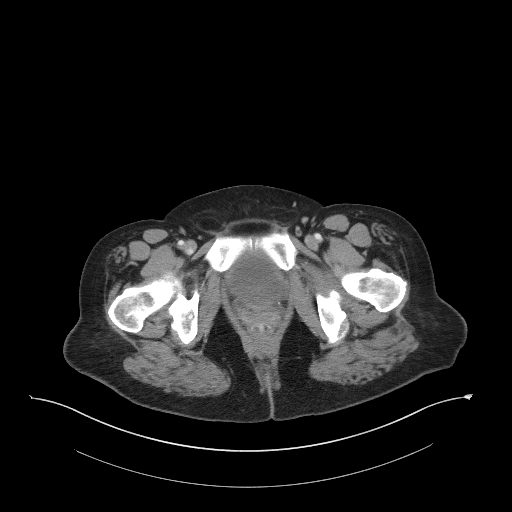
[im 20/93  soft-tissue]
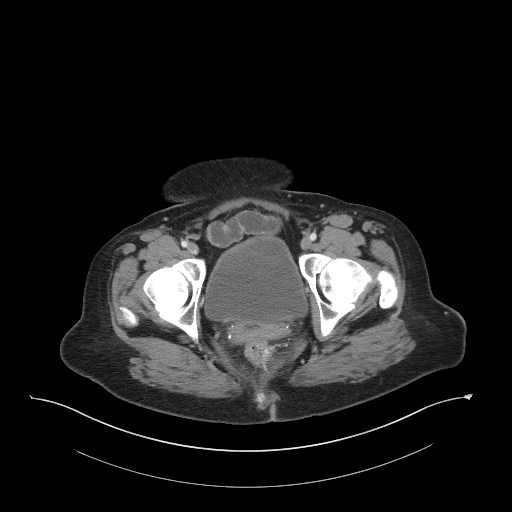
[im 30/93  soft-tissue]
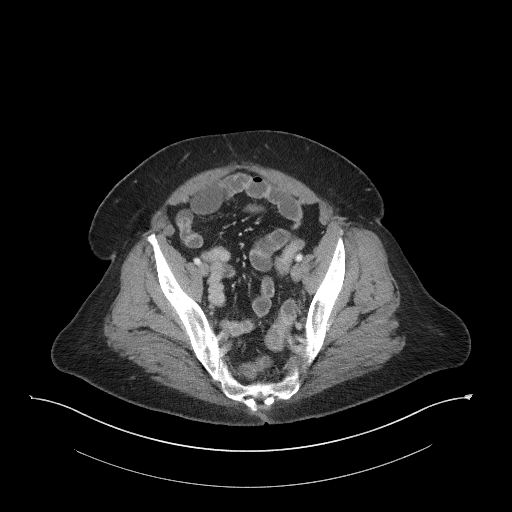
[im 34/93  soft-tissue]
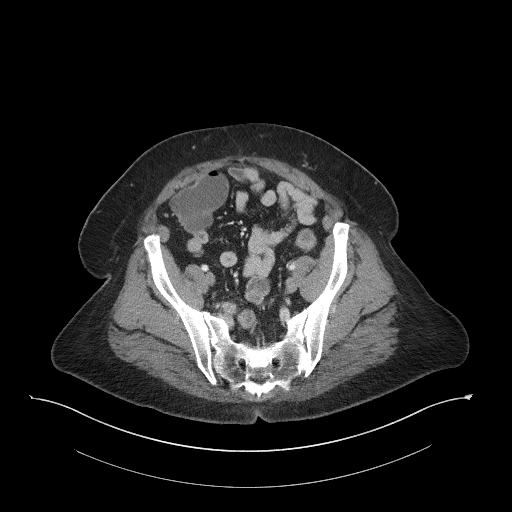
[im 44/93  soft-tissue]
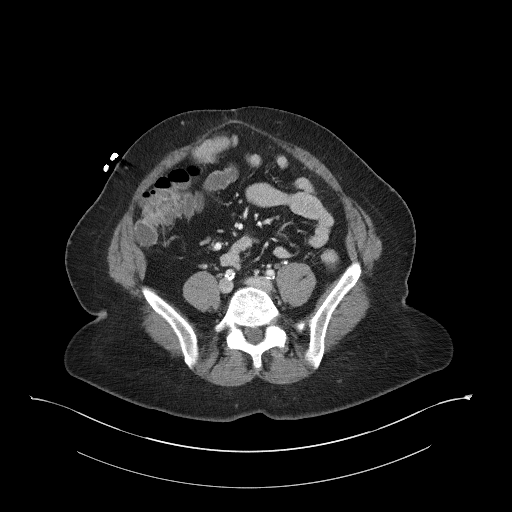
[im 49/93  soft-tissue]
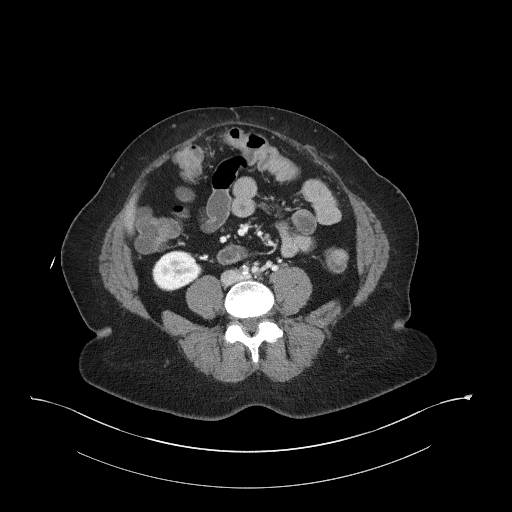
[im 59/93  soft-tissue]
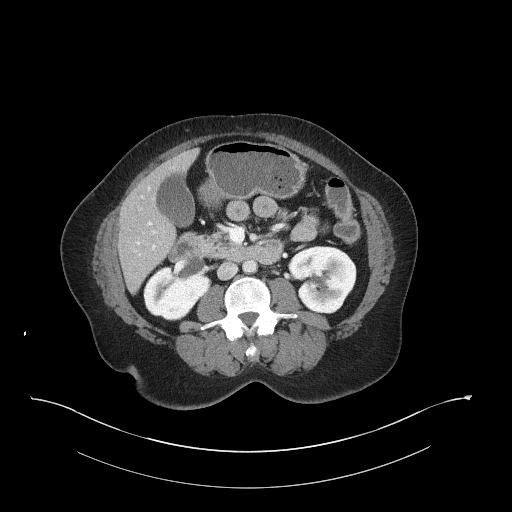
[im 63/93  soft-tissue]
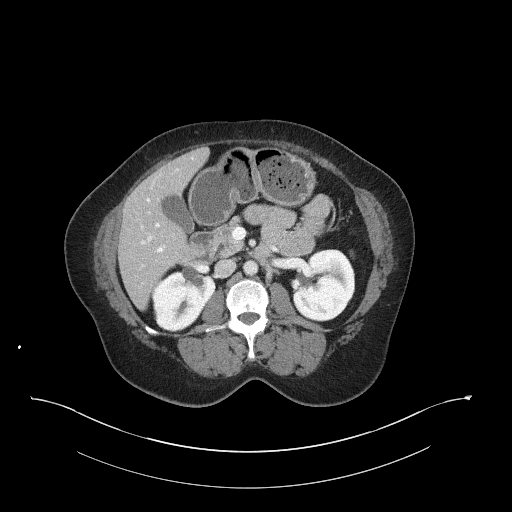
[im 63/93  bone]
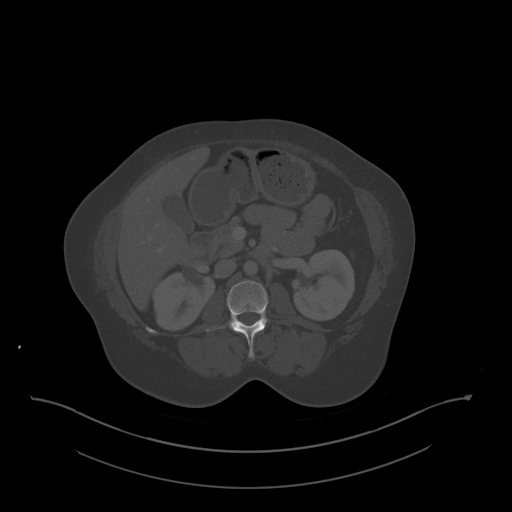
[im 73/93  soft-tissue]
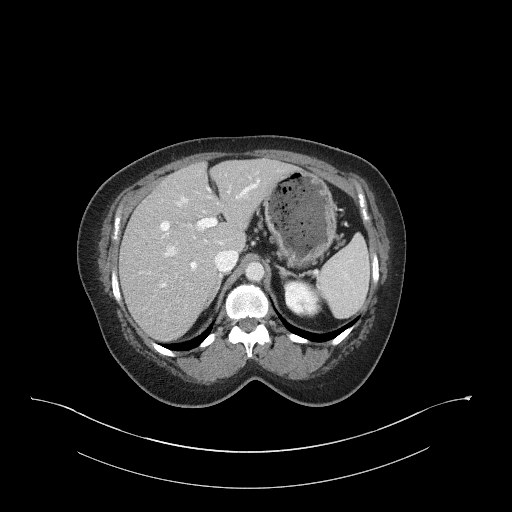
[im 78/93  soft-tissue]
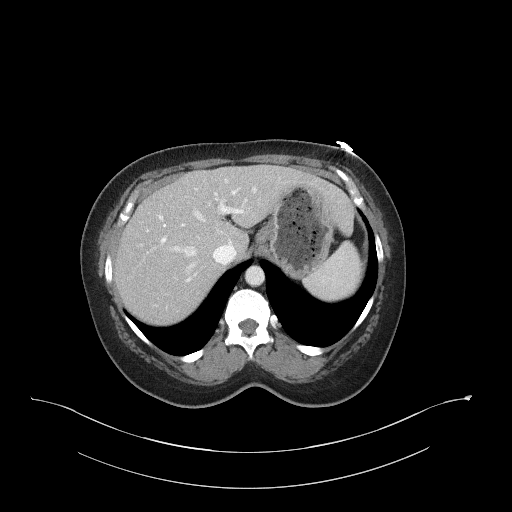
[im 88/93  soft-tissue]
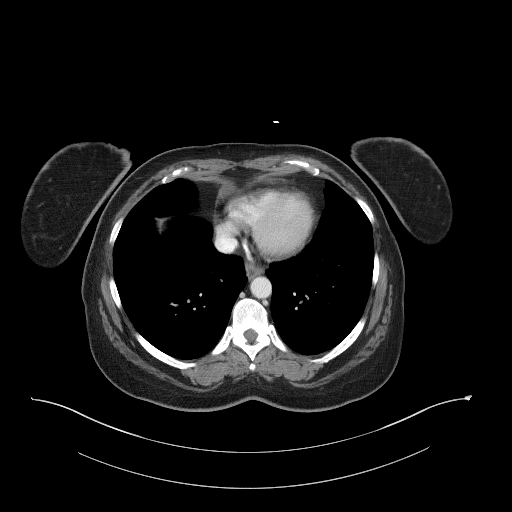

[Series 5: coronal st · coronal · 0.75mm/px · 3 of 103 slices shown]
[im 35/103  soft-tissue]
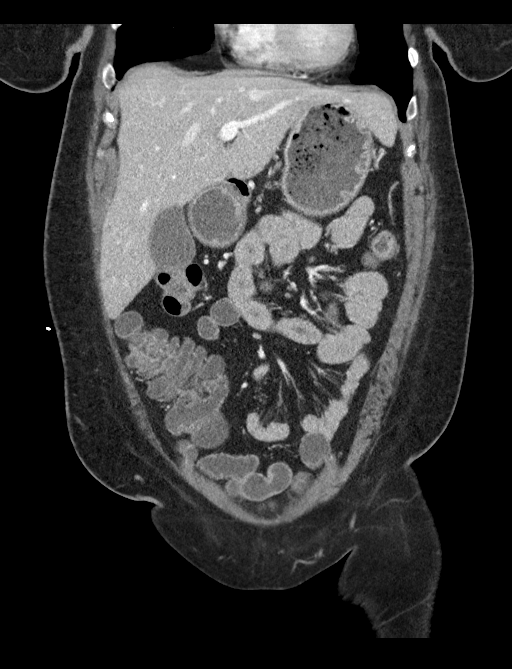
[im 46/103  soft-tissue]
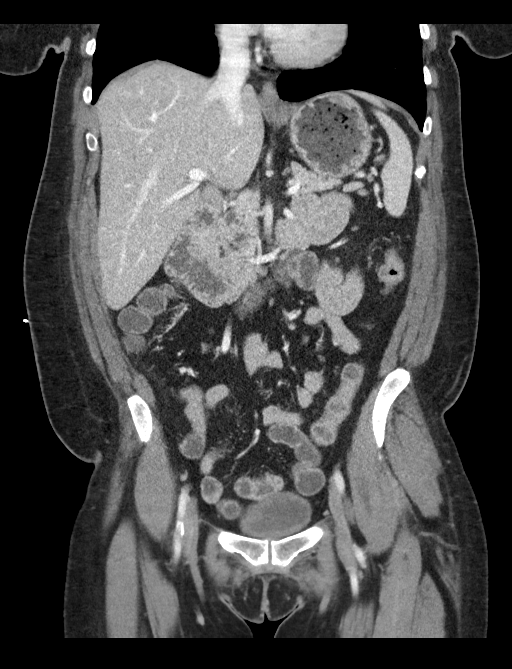
[im 57/103  soft-tissue]
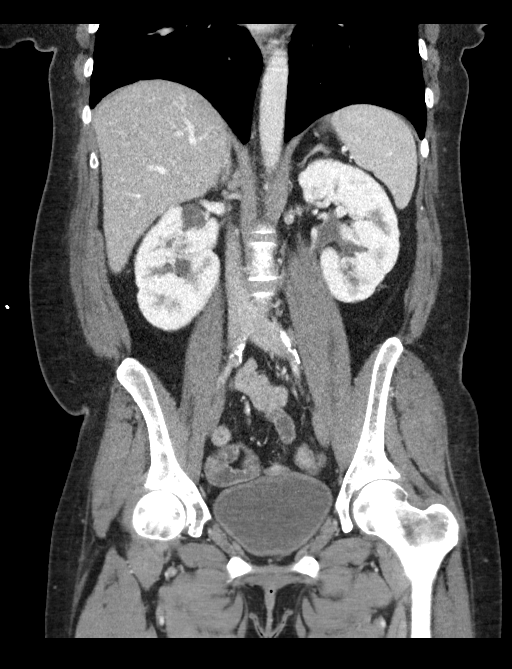

[15 of 46 positions shown; findings below may reference images not displayed]

FINDINGS: Lower chest: The lung bases are clear without focal nodule, mass, or
airspace disease. Heart size is normal. No significant pleural or
pericardial effusion is present.

Hepatobiliary: Mild diffuse fatty infiltration of the liver is again
seen. No discrete lesions are present. The common bile duct and
gallbladder are within normal limits.

Pancreas: Unremarkable. No pancreatic ductal dilatation or
surrounding inflammatory changes.

Spleen: Normal in size without focal abnormality.

Adrenals/Urinary Tract: Adrenal glands are normal bilaterally.
Central sinus cyst of the right kidney is stable. No stone or mass
lesion is present. No obstruction is present. Ureters are within
normal limits. The urinary bladder is unremarkable.

Stomach/Bowel: The stomach duodenum are within normal limits. Small
bowel is unremarkable. Terminal ileum is within normal limits. The
appendix is not discretely visualized and may be surgically absent.
The ascending and transverse colon are within normal limits.
Descending and sigmoid colon are normal.

Vascular/Lymphatic: Atherosclerotic calcifications have progressed
in the abdominal aorta branch vessels. No aneurysm is present.

Reproductive: Status post hysterectomy. No adnexal masses.

Other: A paraumbilical hernia contains fat without bowel. There is
no significant interval change. Fat herniates into the inguinal
canals bilaterally without significant change. No new ventral hernia
is present. No significant free fluid or free air is present.

Musculoskeletal: Vertebral body heights and alignment are normal. No
focal lytic or blastic lesions are present. Bony pelvis is normal.
Hips are located and within normal limits.
IMPRESSION: 1. No acute or focal lesion to explain the patient's symptoms.
2. Hepatic steatosis.
3. Stable paraumbilical hernia contains fat without bowel.
4. Fat herniates into the inguinal canals bilaterally without
significant change.
5. Aortic Atherosclerosis ([id number]).

## 2022-01-29 ENCOUNTER — Inpatient Hospital Stay (HOSPITAL_COMMUNITY)
Admission: AD | Admit: 2022-01-29 | Discharge: 2022-02-07 | DRG: 885 | Disposition: A | Discharge status: Home / Self Care | Payer: No Typology Code available for payment source | Source: Intra-hospital | Attending: Psychiatry | Admitting: Psychiatry

## 2022-01-29 DIAGNOSIS — K5900 Constipation, unspecified: Secondary | ICD-10-CM | POA: Diagnosis present

## 2022-01-29 DIAGNOSIS — E782 Mixed hyperlipidemia: Secondary | ICD-10-CM | POA: Diagnosis present

## 2022-01-29 DIAGNOSIS — G4700 Insomnia, unspecified: Secondary | ICD-10-CM | POA: Diagnosis present

## 2022-01-29 DIAGNOSIS — D573 Sickle-cell trait: Secondary | ICD-10-CM | POA: Diagnosis present

## 2022-01-29 DIAGNOSIS — Z91148 Patient's other noncompliance with medication regimen for other reason: Secondary | ICD-10-CM

## 2022-01-29 DIAGNOSIS — Z79899 Other long term (current) drug therapy: Secondary | ICD-10-CM | POA: Diagnosis not present

## 2022-01-29 DIAGNOSIS — Z794 Long term (current) use of insulin: Secondary | ICD-10-CM | POA: Diagnosis not present

## 2022-01-29 DIAGNOSIS — F332 Major depressive disorder, recurrent severe without psychotic features: Secondary | ICD-10-CM | POA: Diagnosis present

## 2022-01-29 DIAGNOSIS — F17210 Nicotine dependence, cigarettes, uncomplicated: Secondary | ICD-10-CM | POA: Diagnosis present

## 2022-01-29 DIAGNOSIS — I10 Essential (primary) hypertension: Secondary | ICD-10-CM | POA: Diagnosis present

## 2022-01-29 DIAGNOSIS — K219 Gastro-esophageal reflux disease without esophagitis: Secondary | ICD-10-CM | POA: Diagnosis present

## 2022-01-29 DIAGNOSIS — F410 Panic disorder [episodic paroxysmal anxiety] without agoraphobia: Secondary | ICD-10-CM | POA: Diagnosis present

## 2022-01-29 DIAGNOSIS — Z833 Family history of diabetes mellitus: Secondary | ICD-10-CM

## 2022-01-29 DIAGNOSIS — F250 Schizoaffective disorder, bipolar type: Secondary | ICD-10-CM | POA: Diagnosis present

## 2022-01-29 DIAGNOSIS — R45851 Suicidal ideations: Secondary | ICD-10-CM | POA: Diagnosis present

## 2022-01-29 DIAGNOSIS — E1151 Type 2 diabetes mellitus with diabetic peripheral angiopathy without gangrene: Secondary | ICD-10-CM | POA: Diagnosis present

## 2022-01-29 DIAGNOSIS — Z5900 Homelessness unspecified: Secondary | ICD-10-CM

## 2022-01-29 MED ORDER — MAGNESIUM HYDROXIDE 400 MG/5ML PO SUSP: 30.0000 mL | Freq: Every day | ORAL | Status: DC | PRN

## 2022-01-29 MED ORDER — PANTOPRAZOLE SODIUM 40 MG PO TBEC
40.0000 mg | DELAYED_RELEASE_TABLET | Freq: Every day | ORAL | Status: DC
  Administered 2022-01-30: 40 mg via ORAL
  Filled 2022-01-29 (×3): qty 1

## 2022-01-29 MED ORDER — NICOTINE 14 MG/24HR TD PT24
14.0000 mg | MEDICATED_PATCH | Freq: Every day | TRANSDERMAL | Status: DC
  Administered 2022-01-30 – 2022-02-03 (×4): 14 mg via TRANSDERMAL
  Filled 2022-01-29 (×7): qty 1

## 2022-01-29 MED ORDER — TRAZODONE HCL 50 MG PO TABS
50.0000 mg | ORAL_TABLET | Freq: Every evening | ORAL | Status: DC | PRN
  Administered 2022-01-29: 50 mg via ORAL
  Filled 2022-01-29: qty 1

## 2022-01-29 MED ORDER — GABAPENTIN 300 MG PO CAPS
600.0000 mg | ORAL_CAPSULE | Freq: Three times a day (TID) | ORAL | Status: DC
  Administered 2022-01-30 (×2): 600 mg via ORAL
  Filled 2022-01-29 (×7): qty 2

## 2022-01-29 MED ORDER — HYDROXYZINE HCL 25 MG PO TABS
25.0000 mg | ORAL_TABLET | Freq: Three times a day (TID) | ORAL | Status: DC | PRN
  Administered 2022-01-29 – 2022-02-06 (×6): 25 mg via ORAL
  Filled 2022-01-29 (×3): qty 1
  Filled 2022-01-29: qty 10
  Filled 2022-01-29 (×3): qty 1

## 2022-01-29 MED ORDER — INSULIN ASPART 100 UNIT/ML IJ SOLN
0.0000 [IU] | Freq: Three times a day (TID) | INTRAMUSCULAR | Status: DC
  Administered 2022-01-30 (×3): 8 [IU] via SUBCUTANEOUS
  Administered 2022-01-31: 15 [IU] via SUBCUTANEOUS
  Administered 2022-01-31: 5 [IU] via SUBCUTANEOUS
  Administered 2022-02-01 – 2022-02-02 (×4): 8 [IU] via SUBCUTANEOUS

## 2022-01-29 MED ORDER — ALUM & MAG HYDROXIDE-SIMETH 200-200-20 MG/5ML PO SUSP: 30.0000 mL | ORAL | Status: DC | PRN

## 2022-01-30 ENCOUNTER — Other Ambulatory Visit: Payer: Self-pay

## 2022-01-30 ENCOUNTER — Encounter (HOSPITAL_COMMUNITY): Payer: Self-pay | Admitting: Family

## 2022-01-30 DIAGNOSIS — F332 Major depressive disorder, recurrent severe without psychotic features: Secondary | ICD-10-CM

## 2022-01-30 LAB — GLUCOSE, CAPILLARY
Glucose-Capillary: 281 mg/dL — ABNORMAL HIGH (ref 70–99)
Glucose-Capillary: 271 mg/dL — ABNORMAL HIGH (ref 70–99)
Glucose-Capillary: 255 mg/dL — ABNORMAL HIGH (ref 70–99)
Glucose-Capillary: 294 mg/dL — ABNORMAL HIGH (ref 70–99)

## 2022-01-30 LAB — URINALYSIS, COMPLETE (UACMP) WITH MICROSCOPIC
Bacteria, UA: NONE SEEN
Bilirubin Urine: NEGATIVE
Glucose, UA: >=500 mg/dL — AB
Ketones, ur: NEGATIVE mg/dL
Leukocytes,Ua: NEGATIVE
Nitrite: NEGATIVE
Protein, ur: NEGATIVE mg/dL
Specific Gravity, Urine: 1.008 (ref 1.005–1.030)
pH: 5 (ref 5.0–8.0)

## 2022-01-30 LAB — RAPID URINE DRUG SCREEN, HOSP PERFORMED
Amphetamines: NOT DETECTED
Barbiturates: NOT DETECTED
Benzodiazepines: NOT DETECTED
Cocaine: NOT DETECTED
Opiates: NOT DETECTED
Tetrahydrocannabinol: POSITIVE — AB

## 2022-01-30 MED ORDER — ARIPIPRAZOLE 15 MG PO TABS
15.0000 mg | ORAL_TABLET | Freq: Every day | ORAL | Status: DC
  Administered 2022-01-30 – 2022-02-06 (×7): 15 mg via ORAL
  Filled 2022-01-30 (×10): qty 1

## 2022-01-30 MED ORDER — IBUPROFEN 400 MG PO TABS
400.0000 mg | ORAL_TABLET | Freq: Three times a day (TID) | ORAL | Status: DC
  Filled 2022-01-30: qty 1

## 2022-01-30 MED ORDER — LISINOPRIL 10 MG PO TABS
10.0000 mg | ORAL_TABLET | Freq: Every day | ORAL | Status: DC
  Administered 2022-01-31 – 2022-02-07 (×6): 10 mg via ORAL
  Filled 2022-01-30: qty 1
  Filled 2022-01-30 (×4): qty 2
  Filled 2022-01-30: qty 1
  Filled 2022-01-30 (×5): qty 2

## 2022-01-30 MED ORDER — ALBUTEROL SULFATE HFA 108 (90 BASE) MCG/ACT IN AERS
2.0000 | INHALATION_SPRAY | RESPIRATORY_TRACT | Status: DC
  Administered 2022-01-30: 2 via RESPIRATORY_TRACT
  Filled 2022-01-30 (×2): qty 6.7

## 2022-01-30 MED ORDER — PANTOPRAZOLE SODIUM 40 MG PO TBEC
40.0000 mg | DELAYED_RELEASE_TABLET | Freq: Every day | ORAL | Status: DC
  Filled 2022-01-30 (×2): qty 1

## 2022-01-30 MED ORDER — ONDANSETRON 4 MG PO TBDP: 4.0000 mg | ORAL_TABLET | Freq: Three times a day (TID) | ORAL | Status: DC | PRN

## 2022-01-30 MED ORDER — GABAPENTIN 300 MG PO CAPS
300.0000 mg | ORAL_CAPSULE | Freq: Three times a day (TID) | ORAL | Status: DC
  Administered 2022-01-30: 300 mg via ORAL
  Filled 2022-01-30 (×3): qty 1

## 2022-01-30 MED ORDER — GABAPENTIN 400 MG PO CAPS
400.0000 mg | ORAL_CAPSULE | Freq: Three times a day (TID) | ORAL | Status: DC
  Administered 2022-01-31 – 2022-02-07 (×21): 400 mg via ORAL
  Filled 2022-01-30 (×25): qty 1

## 2022-01-30 MED ORDER — PRAZOSIN HCL 1 MG PO CAPS
1.0000 mg | ORAL_CAPSULE | Freq: Every day | ORAL | Status: DC
  Administered 2022-01-30 – 2022-02-01 (×3): 1 mg via ORAL
  Filled 2022-01-30 (×8): qty 1

## 2022-01-30 MED ORDER — TRAZODONE HCL 100 MG PO TABS
100.0000 mg | ORAL_TABLET | Freq: Every evening | ORAL | Status: DC | PRN
  Administered 2022-02-01 – 2022-02-06 (×4): 100 mg via ORAL
  Filled 2022-01-30: qty 1
  Filled 2022-01-30: qty 7
  Filled 2022-01-30 (×3): qty 1

## 2022-01-30 MED ORDER — ALBUTEROL SULFATE HFA 108 (90 BASE) MCG/ACT IN AERS
2.0000 | INHALATION_SPRAY | Freq: Four times a day (QID) | RESPIRATORY_TRACT | Status: DC | PRN
  Administered 2022-01-31 – 2022-02-07 (×9): 2 via RESPIRATORY_TRACT

## 2022-01-30 MED ORDER — FLUOXETINE HCL 20 MG PO CAPS
20.0000 mg | ORAL_CAPSULE | Freq: Every day | ORAL | Status: DC
  Administered 2022-01-30 – 2022-02-07 (×9): 20 mg via ORAL
  Filled 2022-01-30 (×11): qty 1

## 2022-01-30 MED ORDER — IBUPROFEN 400 MG PO TABS
400.0000 mg | ORAL_TABLET | Freq: Four times a day (QID) | ORAL | Status: DC | PRN
  Administered 2022-01-30 – 2022-02-07 (×19): 400 mg via ORAL
  Filled 2022-01-30 (×18): qty 1
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
## 2022-01-30 NOTE — Progress Notes (Signed)
Admission Note:   Patient is a 47 year old female voluntarily admitted to  adult unit, room 305-2 from [facility] ED, [place], due to suicidal thoughts with a plan to walk in front of traffic. Patient presents with the diagnosis of Schizoaffective, bipolar, and depression. Patient is alert and oriented x 3, ambulatory, skin intact with multiple tattoos. Patient reports she has two sons, but not currently have close relationship with them. Patient stated she is homeless and received disability income. Patient reports she is a diabetic, and ran out of insulin about three month ago. Patient stated she quit eating because "I was trying to go into diabetic coma, and I have lost a lot of weight." Patient stressors including "life, family, homeless, and finances." Patient reports history of substance use including cocaine (powder), alcohol and marijuana, She reports last use of alcohol was "yesterday" (01/28/2022), which she reports drinking 3 small cans of "wine cola." Patient endorses passive SI, patient stated "I have tried suicide twice and it didn't work, I don't want to live, I am tired, I took pills, there is nothing out there for me." Patient appeared depressed, sad, anxious and tearful, patient presents with hopelessness, worried and concerned that after treatment here, "I'm back on the street." Patient denies HI & AVH. RN provided emotional support and encouragement to patient, patient was verbally contracted for safety. PRN Trazodone and Hydroxyzine administered for sleep and anxiety at 2230 respectively per [facility]. Patient oriented to the unit, Safety observations maintained at Q 15 minutes, patient remains safe on the unit.

## 2022-01-30 NOTE — Progress Notes (Addendum)
Patient visibly upset over not having pain medication scheduled, gabapentin dose being lowered, and refused her lisinopril this evening stating "It will lower my blood pressure and have me passed out." Patient stated "It will be a bad day tomorrow if I don't get my pain medication." Provider notified and updated. Patient appears frustrated and also refused her vs this evening.

## 2022-01-30 NOTE — Progress Notes (Signed)
Psychoeducational Group Note  Date:  01/30/2022 Time:  2031  Group Topic/Focus:  Wrap-Up Group:   The focus of this group is to help patients review their daily goal of treatment and discuss progress on daily workbooks.  Participation Level: Did Not Attend  Participation Quality:  Not Applicable  Affect:  Not Applicable  Cognitive:  Not Applicable  Insight:  Not Applicable  Engagement in Group: Not Applicable  Additional Comments:  The patient did not attend group this evening.   Archie Balboa S 01/30/2022, 8:31 PM
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
## 2022-01-30 NOTE — Tx Team (Signed)
Initial Treatment Plan 01/30/2022 3:27 AM [place] [id number]    PATIENT STRESSORS: Financial difficulties   Occupational concerns     PATIENT STRENGTHS: Motivation for treatment/growth    PATIENT IDENTIFIED PROBLEMS: Homeless  Suicidal  Depression  Anxiety  Hopelessness  Insomnia  "Find a place to stay."  "Get my mental health together."       DISCHARGE CRITERIA:  Adequate post-discharge living arrangements Improved stabilization in mood, thinking, and/or behavior Motivation to continue treatment in a less acute level of care  PRELIMINARY DISCHARGE PLAN: Attend aftercare/continuing care group Outpatient therapy Placement in alternative living arrangements  PATIENT/FAMILY INVOLVEMENT: This treatment plan has been presented to and reviewed with the patient, DANESSA MENSCH, and/or family member, [id number].  The patient and family have been given the opportunity to ask questions and make suggestions.  Ernest Pine, RN 01/30/2022, 3:27 AM

## 2022-01-30 NOTE — Inpatient Diabetes Management (Addendum)
Inpatient Diabetes Program Recommendations  AACE/ADA: New Consensus Statement on Inpatient Glycemic Control (2015)  Target Ranges:  Prepandial:   less than 140 mg/dL      Peak postprandial:   less than 180 mg/dL (1-2 hours)      Critically ill patients:  140 - 180 mg/dL   Lab Results  Component Value Date   GLUCAP 281 (H) 01/30/2022    Review of Glycemic Control  Diabetes history: DM2 Outpatient Diabetes medications: Not taken since 3 month Lantus 10 units qd, Lispro, Farxiga 10 mg qd Current orders for Inpatient glycemic control: Novolog 0-15 units tid  Inpatient Diabetes Program Recommendations:   -Semglee 10 units qd  Thank you, Nani Gasser. Francie Keeling, RN, MSN, CDE  Diabetes Coordinator Inpatient Glycemic Control Team Team Pager [phone] (8am-5pm) 01/30/2022 8:35 AM

## 2022-01-30 NOTE — Group Note (Signed)
[facility] LCSW Group Therapy Note   Group Date: 01/30/2022 Start Time: 1100 End Time: 1200   Type of Therapy/Topic:  Group Therapy:  Balance in Life  Participation Level:  Did Not Attend   Description of Group:    This group will address the concept of balance and how it feels and looks when one is unbalanced. Patients will be encouraged to process areas in their lives that are out of balance, and identify reasons for remaining unbalanced. Facilitators will guide patients utilizing problem- solving interventions to address and correct the stressor making their life unbalanced. Understanding and applying boundaries will be explored and addressed for obtaining  and maintaining a balanced life. Patients will be encouraged to explore ways to assertively make their unbalanced needs known to significant others in their lives, using other group members and facilitator for support and feedback.  Therapeutic Goals: Patient will identify two or more emotions or situations they have that consume much of in their lives. Patient will identify signs/triggers that life has become out of balance:  Patient will identify two ways to set boundaries in order to achieve balance in their lives:  Patient will demonstrate ability to communicate their needs through discussion and/or role plays  Summary of Patient Progress:        Therapeutic Modalities:   Cognitive Behavioral Therapy Solution-Focused Therapy Assertiveness Training   Sara [name] S Velta Rockholt, LCSW 

## 2022-01-30 NOTE — Progress Notes (Addendum)
Patient observed in room this morning. Patient pleasant and smiling on approach. Patient reports adequate appetite but poor sleep and endorses passive SI with no plan/intent. Patient denies HI and A/V/H. Patient stated her main concern was controlling her pain. Patient verbalizes chronic pain at her lower back and legs bilaterally. Patient stated her pain has been uncontrolled for some time and stated she is allergic to tylenol (liquid form but not pill form). Provider made aware and patient provided with heating packs to help reduce some back pain while awaiting to speak with provider.  Patient verbalizes no further concerns other than her worry about homelessness. No s/s of current distress. Patient cooperative and engaging in milieu.     01/30/22 0810  Psych Admission Type (Psych Patients Only)  Admission Status Voluntary  Psychosocial Assessment  Patient Complaints Depression;Sleep disturbance  Eye Contact Fair  Facial Expression Animated  Affect Depressed  Speech Logical/coherent  Interaction Assertive  Motor Activity Slow;Unsteady  Appearance/Hygiene Disheveled  Behavior Characteristics Cooperative;Appropriate to situation  Mood Depressed;Pleasant  Thought Process  Coherency WDL  Content WDL  Delusions None reported or observed  Perception WDL  Hallucination None reported or observed  Judgment Limited  Confusion None  Danger to Self  Current suicidal ideation? Passive  Self-Injurious Behavior No self-injurious ideation or behavior indicators observed or expressed   Agreement Not to Harm Self Yes  Description of Agreement verbally contrqacts for safety  Danger to Others  Danger to Others None reported or observed  Danger to Others Abnormal  Harmful Behavior to others No threats or harm toward other people  Destructive Behavior No threats or harm toward property
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
## 2022-01-30 NOTE — Progress Notes (Incomplete)
   01/30/22 0701  15 Minute Checks  Location Cafeteria  Visual Appearance Calm  Behavior Composed  Sleep (Behavioral Health Patients Only)  Calculate sleep? (Click Yes once per 24 hr at 0600 safety check) Yes  Documented sleep last 24 hours 5.5

## 2022-01-30 NOTE — [facility] Suicide Risk Assessment (Signed)
[facility] INPATIENT:  Family/Significant Other Suicide Prevention Education  Suicide Prevention Education:  Education Completed; 01-30-2022, (Son) Jendaya Gossett [phone]  has been identified by the patient as the family member/significant other with whom the patient will be residing, and identified as the person(s) who will aid the patient in the event of a mental health crisis (suicidal ideations/suicide attempt).  With written consent from the patient, the (Son) Idalee Foxworthy [phone]  has been provided the following suicide prevention education, prior to the and/or following the discharge of the patient.  The suicide prevention education provided includes the following: Suicide risk factors Suicide prevention and interventions National Suicide Hotline telephone number [facility] assessment telephone number [facility] Emergency Assistance [place] and/or Residential Mobile Crisis Unit telephone number  Request made of family/significant other to: Remove weapons (e.g., guns, rifles, knives), all items previously/currently identified as safety concern.   Remove drugs/medications (over-the-counter, prescriptions, illicit drugs), all items previously/currently identified as a safety concern.  (Son) Knox Holdman [phone] verbalizes understanding of the suicide prevention education information provided.  The family member/significant other agrees to remove the items of safety concern listed above.  Mechel Haggard S Donnavin Vandenbrink 01/30/2022, 3:15 PM
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
## 2022-01-30 NOTE — Plan of Care (Signed)
  Problem: Education: Goal: Ability to describe self-care measures that may prevent or decrease complications (Diabetes Survival Skills Education) will improve 01/30/2022 1055 by Melinda Crutch, RN Outcome: Progressing 01/30/2022 1054 by Melinda Crutch, RN Outcome: Progressing   Problem: Safety: Goal: Ability to disclose and discuss suicidal ideas will improve 01/30/2022 1055 by Melinda Crutch, RN Outcome: Progressing 01/30/2022 1054 by Melinda Crutch, RN Outcome: Progressing   Problem: Safety: Goal: Periods of time without injury will increase Outcome: Progressing

## 2022-01-31 ENCOUNTER — Encounter (HOSPITAL_COMMUNITY): Payer: Self-pay

## 2022-01-31 DIAGNOSIS — F332 Major depressive disorder, recurrent severe without psychotic features: Secondary | ICD-10-CM | POA: Diagnosis not present

## 2022-01-31 LAB — GLUCOSE, CAPILLARY
Glucose-Capillary: 212 mg/dL — ABNORMAL HIGH (ref 70–99)
Glucose-Capillary: 278 mg/dL — ABNORMAL HIGH (ref 70–99)
Glucose-Capillary: 322 mg/dL — ABNORMAL HIGH (ref 70–99)
Glucose-Capillary: 347 mg/dL — ABNORMAL HIGH (ref 70–99)
Glucose-Capillary: 353 mg/dL — ABNORMAL HIGH (ref 70–99)

## 2022-01-31 MED ORDER — RISPERIDONE 2 MG PO TBDP: 2.0000 mg | ORAL_TABLET | Freq: Three times a day (TID) | ORAL | Status: DC | PRN

## 2022-01-31 MED ORDER — INSULIN ASPART 100 UNIT/ML IJ SOLN
0.0000 [IU] | Freq: Every day | INTRAMUSCULAR | Status: DC
  Administered 2022-01-31: 5 [IU] via SUBCUTANEOUS

## 2022-01-31 MED ORDER — INSULIN ASPART 100 UNIT/ML IJ SOLN: 0.0000 [IU] | Freq: Three times a day (TID) | INTRAMUSCULAR | Status: DC

## 2022-01-31 MED ORDER — LORAZEPAM 1 MG PO TABS: 1.0000 mg | ORAL_TABLET | ORAL | Status: DC | PRN

## 2022-01-31 MED ORDER — INSULIN GLARGINE-YFGN 100 UNIT/ML ~~LOC~~ SOLN
10.0000 [IU] | Freq: Every day | SUBCUTANEOUS | Status: DC
  Administered 2022-01-31 – 2022-02-02 (×3): 10 [IU] via SUBCUTANEOUS

## 2022-01-31 MED ORDER — ZIPRASIDONE MESYLATE 20 MG IM SOLR: 20.0000 mg | INTRAMUSCULAR | Status: DC | PRN

## 2022-01-31 NOTE — Progress Notes (Addendum)
D: Patient alert and oriented, able to make needs known. Denies SI/HI, AVH at present. Denies pain at present. Patient goal today "staying calm." Rates depression 6/10, hopelessness 10/10, and anxiety 6/10. Patient reports energy level as normal. She reports she slept poor last night. Patient does not request any PRN medication at this time.   A: Scheduled medications administered to patient per MD order. Support and encouragement provided. Routine safety checks conducted every fifteen minutes. Patient informed to notify staff with problems or concerns. Frequent verbal contact made.   R: No adverse drug reactions noted. Patient contracts for safety at this time. Patient is compliant with medications and treatment plan. Patient receptive, calm and cooperative. Patient interacts with others appropriately on unit at present. Patient remains safe at present.

## 2022-01-31 NOTE — Progress Notes (Signed)
RN spoke with Sara Kluver NP about continued elevated pulse rate and refusal of vitals and medications. Agitation protocol ordered. Continue to monitor and reported to [place], [place] for progression this am.

## 2022-01-31 NOTE — Progress Notes (Addendum)
[facility] MD Progress Note  01/31/2022 3:18 PM KATHYA WILZ  MRN:  [id number] Subjective:  "I was doing alright until staff came in here last night harassing me saying I have to take insulin at 0130 in the morning'.  Principal Problem: MDD (major depressive disorder), recurrent episode, severe (HCC) Diagnosis: Principal Problem:   MDD (major depressive disorder), recurrent episode, severe (HCC)  HPI: Sara [name] is a 47 year old African American female with a past psychiatric diagnoses of schizoaffective disorder, bipolar, and major depressive disorder recurrent severe who presented voluntarily to [facility] 01/29/22 from [facility] for worsening depression and suicidal thoughts. Patient noted to have multiple medical co-morbidities including diabetes, sickle cell trait, hypertension, hyperlipidemia mixed. On initial assessment patient reported increased stressors related to homelessness, substance abuse (cocaine and marijuana), family issues (recently asked to leave son's home), and increased depression with suicidal ideations where she reports intentionally stopped eating and taking her insulin and eating to induce diabetic coma for suicide. She reports significant weight-loss as a result.   24 hr assessment: Patient noted to be irritable and ambivalent overnight regarding medication compliance. She refused Lisinopril and overnight Insulin. No noted group participation yesterday. Noted to have slept overnight 5.5 hours. Otherwise calm.   Assessment: Patient assessed in her room where she initially presented laying down. She presented casual appearance, good eye contact. Alert and oriented. Describes not sleeping well during the night after an exchange with staff over insulin that she states she was awakened out of her sleep at 0130 to take; states that after she refused it there was a verbal exchange between herself and several staff members that affect her mood until the morning. She reports loss of  appetite and withdrawing to her room as a result. Denies intentionally refusing insulin as a self harm method but states she was asleep. She rates her anxiety and depression as 6/10.  Provider spent extensive time discussing coping skills and goals; patient verbalized an understanding with a plan to maintain her personal goals despite of what may occur on the unit. Was observed getting out of bed to complete ADLs and states she was attending unit groups. Denies any thoughts of wanting to harm herself or anyone, auditory or visual hallucinations. Contracts for safety and states plan to increase unit engagement.   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 pylori infection    High risk medication use    Hypercholesterolemia    Hyperlipidemia, mixed    Hypertension    Other mixed anxiety disorders    Pollen allergies    Sickle cell trait (HCC)    Sleep apnea    Vitamin D deficiency     Past Surgical History:  Procedure Laterality Date   ABDOMINAL HYSTERECTOMY     APPENDECTOMY     COLONOSCOPY  08-16-2008   Dr. Vallarie Mare   ESOPHAGOGASTRODUODENOSCOPY  08-16-2008   Dr. Rayfield Citizen    Family History:  Family History  Problem Relation Age of Onset   Clotting disorder Mother    Crohn's disease Mother    Heart disease Mother    Colon cancer Father    Clotting disorder Brother    Diabetes Brother  Diabetes Maternal Aunt    Liver cancer Maternal Uncle    Prostate cancer Maternal Uncle    Colon cancer Maternal Uncle    Diabetes Paternal Aunt    Esophageal cancer Paternal Uncle    Family Psychiatric History: not noted Social History:  Social History   Substance and Sexual Activity  Alcohol Use None     Social History   Substance and Sexual  Activity  Drug Use Yes   Types: Marijuana    Social History   Socioeconomic History   Marital status: Married    Spouse name: Not on file   Number of children: 2   Years of education: Not on file   Highest education level: Not on file  Occupational History   Occupation: n/a  Tobacco Use   Smoking status: Every Day    Packs/day: 1.00    Types: Cigarettes   Smokeless tobacco: Never  Substance and Sexual Activity   Alcohol use: Not on file   Drug use: Yes    Types: Marijuana   Sexual activity: Not on file  Other Topics Concern   Not on file  Social History Narrative   Not on file   Social Determinants of Health   Financial Resource Strain: Not on file  Food Insecurity: Food Insecurity Present (01/30/2022)   Hunger Vital Sign    Worried About Running Out of Food in the Last Year: Often true    Ran Out of Food in the Last Year: Often true  Transportation Needs: Unknown (01/30/2022)   PRAPARE - Administrator, Civil Service (Medical): Patient refused    Lack of Transportation (Non-Medical): Patient refused  Physical Activity: Not on file  Stress: Not on file  Social Connections: Not on file   Additional Social History:   Sleep: Fair  Appetite:  Good  Current Medications: Current Facility-Administered Medications  Medication Dose Route Frequency Provider Last Rate Last Admin   albuterol (VENTOLIN HFA) 108 (90 Base) MCG/ACT inhaler 2 puff  2 puff Inhalation Q6H PRN Onuoha, Chinwendu V, NP       alum & mag hydroxide-simeth (MAALOX/MYLANTA) 200-200-20 MG/5ML suspension 30 mL  30 mL Oral Q4H PRN Lenard Lance, FNP       ARIPiprazole (ABILIFY) tablet 15 mg  15 mg Oral QHS Ntuen, Tina C, FNP   15 mg at 01/30/22 2123   FLUoxetine (PROZAC) capsule 20 mg  20 mg Oral Daily Ntuen, Tina C, FNP   20 mg at 01/31/22 1013   gabapentin (NEURONTIN) capsule 400 mg  400 mg Oral TID Cecilie Lowers, FNP   400 mg at 01/31/22 1013   hydrOXYzine (ATARAX) tablet 25 mg  25 mg Oral TID  PRN Lenard Lance, FNP   25 mg at 01/30/22 2052   ibuprofen (ADVIL) tablet 400 mg  400 mg Oral Q6H PRN Cecilie Lowers, FNP   400 mg at 01/31/22 1014   insulin aspart (novoLOG) injection 0-15 Units  0-15 Units Subcutaneous TID WC Lenard Lance, FNP   15 Units at 01/31/22 1211   insulin aspart (novoLOG) injection 0-5 Units  0-5 Units Subcutaneous QHS Onuoha, Chinwendu V, NP       insulin glargine-yfgn (SEMGLEE) injection 10 Units  10 Units Subcutaneous Daily Massengill, Nathan, MD   10 Units at 01/31/22 1020   lisinopril (ZESTRIL) tablet 10 mg  10 mg Oral Daily Ntuen, Tina C, FNP   10 mg at 01/31/22 1013   risperiDONE (RISPERDAL M-TABS) disintegrating tablet  2 mg  2 mg Oral Q8H PRN Ntuen, Jesusita Oka, FNP       And   LORazepam (ATIVAN) tablet 1 mg  1 mg Oral PRN Ntuen, Jesusita Oka, FNP       And   ziprasidone (GEODON) injection 20 mg  20 mg Intramuscular PRN Ntuen, Jesusita Oka, FNP       magnesium hydroxide (MILK OF MAGNESIA) suspension 30 mL  30 mL Oral Daily PRN Lenard Lance, FNP       nicotine (NICODERM CQ - dosed in mg/24 hours) patch 14 mg  14 mg Transdermal Daily Lenard Lance, FNP   14 mg at 01/31/22 1016   ondansetron (ZOFRAN-ODT) disintegrating tablet 4 mg  4 mg Oral Q8H PRN Ntuen, Jesusita Oka, FNP       prazosin (MINIPRESS) capsule 1 mg  1 mg Oral QHS Ntuen, Tina C, FNP   1 mg at 01/30/22 2123   traZODone (DESYREL) tablet 100 mg  100 mg Oral QHS PRN Ntuen, Jesusita Oka, FNP        Lab Results:  Results for orders placed or performed during the hospital encounter of 01/29/22 (from the past 48 hour(s))  Glucose, capillary     Status: Abnormal   Collection Time: 01/30/22  5:32 AM  Result Value Ref Range   Glucose-Capillary 281 (H) 70 - 99 mg/dL    Comment: Glucose reference range applies only to samples taken after fasting for at least 8 hours.   Comment 1 Notify RN   Glucose, capillary     Status: Abnormal   Collection Time: 01/30/22 12:07 PM  Result Value Ref Range   Glucose-Capillary 271 (H) 70 - 99 mg/dL     Comment: Glucose reference range applies only to samples taken after fasting for at least 8 hours.  Glucose, capillary     Status: Abnormal   Collection Time: 01/30/22  5:18 PM  Result Value Ref Range   Glucose-Capillary 255 (H) 70 - 99 mg/dL    Comment: Glucose reference range applies only to samples taken after fasting for at least 8 hours.  Urinalysis, Complete w Microscopic Urine, Clean Catch     Status: Abnormal   Collection Time: 01/30/22  5:29 PM  Result Value Ref Range   Color, Urine STRAW (A) YELLOW   APPearance CLEAR CLEAR   Specific Gravity, Urine 1.008 1.005 - 1.030   pH 5.0 5.0 - 8.0   Glucose, UA >=500 (A) NEGATIVE mg/dL   Hgb urine dipstick SMALL (A) NEGATIVE   Bilirubin Urine NEGATIVE NEGATIVE   Ketones, ur NEGATIVE NEGATIVE mg/dL   Protein, ur NEGATIVE NEGATIVE mg/dL   Nitrite NEGATIVE NEGATIVE   Leukocytes,Ua NEGATIVE NEGATIVE   RBC / HPF 0-5 0 - 5 RBC/hpf   WBC, UA 0-5 0 - 5 WBC/hpf   Bacteria, UA NONE SEEN NONE SEEN   Squamous Epithelial / HPF 0-5 0 - 5 /HPF   Mucus PRESENT    Hyaline Casts, UA PRESENT     Comment: Performed at [facility], [address], [place], [place] [address]  Rapid urine drug screen (hospital performed)     Status: Abnormal   Collection Time: 01/30/22  5:29 PM  Result Value Ref Range   Opiates NONE DETECTED NONE DETECTED   Cocaine NONE DETECTED NONE DETECTED   Benzodiazepines NONE DETECTED NONE DETECTED   Amphetamines NONE DETECTED NONE DETECTED   Tetrahydrocannabinol POSITIVE (A) NONE DETECTED   Barbiturates NONE DETECTED NONE DETECTED  Comment: (NOTE) DRUG SCREEN FOR MEDICAL PURPOSES ONLY.  IF CONFIRMATION IS NEEDED FOR ANY PURPOSE, NOTIFY LAB WITHIN 5 DAYS.  LOWEST DETECTABLE LIMITS FOR URINE DRUG SCREEN Drug Class                     Cutoff (ng/mL) Amphetamine and metabolites    1000 Barbiturate and metabolites    200 Benzodiazepine                 200 Opiates and metabolites        300 Cocaine  and metabolites        300 THC                            50 Performed at [facility], [place] [address], [place], [place] [address]   Glucose, capillary     Status: Abnormal   Collection Time: 01/30/22  8:51 PM  Result Value Ref Range   Glucose-Capillary 294 (H) 70 - 99 mg/dL    Comment: Glucose reference range applies only to samples taken after fasting for at least 8 hours.   Comment 1 Notify RN    Comment 2 Document in Chart   Glucose, capillary     Status: Abnormal   Collection Time: 01/31/22 12:40 AM  Result Value Ref Range   Glucose-Capillary 322 (H) 70 - 99 mg/dL    Comment: Glucose reference range applies only to samples taken after fasting for at least 8 hours.   Comment 1 Notify RN   Glucose, capillary     Status: Abnormal   Collection Time: 01/31/22 10:18 AM  Result Value Ref Range   Glucose-Capillary 278 (H) 70 - 99 mg/dL    Comment: Glucose reference range applies only to samples taken after fasting for at least 8 hours.  Glucose, capillary     Status: Abnormal   Collection Time: 01/31/22 12:06 PM  Result Value Ref Range   Glucose-Capillary 347 (H) 70 - 99 mg/dL    Comment: Glucose reference range applies only to samples taken after fasting for at least 8 hours.    Blood Alcohol level:  Lab Results  Component Value Date   [facility]  12/11/2009    <5        LOWEST DETECTABLE LIMIT FOR SERUM ALCOHOL IS 5 mg/dL FOR MEDICAL PURPOSES ONLY    Metabolic Disorder Labs: No results found for: "HGBA1C", "MPG" No results found for: "PROLACTIN" No results found for: "CHOL", "TRIG", "HDL", "CHOLHDL", "VLDL", "LDLCALC"  Physical Findings: AIMS:  , ,  ,  ,    CIWA:    COWS:     Musculoskeletal: Strength & Muscle Tone: within normal limits Gait & Station: normal Patient leans: N/A  Psychiatric Specialty Exam:  Presentation  General Appearance:  Appropriate for Environment; Casual  Eye Contact: Good  Speech: Clear and Coherent  Speech  Volume: Normal  Handedness: Right  Mood and Affect  Mood: Dysphoric; Depressed  Affect: Congruent; Depressed  Thought Process  Thought Processes: Coherent  Descriptions of Associations:Intact  Orientation:Full (Time, Place and Person)  Thought Content:Logical  History of Schizophrenia/Schizoaffective disorder:No data recorded Duration of Psychotic Symptoms:No data recorded Hallucinations:Hallucinations: None  Ideas of Reference:None  Suicidal Thoughts:Suicidal Thoughts: No SI Passive Intent and/or Plan: Without Intent; Without Plan; Without Access to Means  Homicidal Thoughts:Homicidal Thoughts: No  Sensorium  Memory: Immediate Good; Recent Good  Judgment: Fair  Insight: Dietitian  Concentration: Good  Attention Span: Good  Recall: Good  Fund of Knowledge: Good  Language: Good  Psychomotor Activity  Psychomotor Activity: Psychomotor Activity: Normal  Assets  Assets: Communication Skills; Resilience; Desire for Improvement  Sleep  Sleep: Sleep: Fair Number of Hours of Sleep: 0  Physical Exam: Physical Exam Vitals and nursing note reviewed.  Constitutional:      Appearance: She is normal weight. She is not ill-appearing.  HENT:     Head: Normocephalic.     Nose: Nose normal.     Mouth/Throat:     Mouth: Mucous membranes are moist.     Pharynx: Oropharynx is clear.  Eyes:     Pupils: Pupils are equal, round, and reactive to light.  Cardiovascular:     Rate and Rhythm: Normal rate.     Pulses: Normal pulses.  Pulmonary:     Effort: Pulmonary effort is normal.  Abdominal:     Palpations: Abdomen is soft.  Musculoskeletal:        General: Normal range of motion.     Cervical back: Normal range of motion.  Skin:    General: Skin is warm and dry.  Neurological:     Mental Status: She is alert and oriented to person, place, and time.  Psychiatric:        Attention and Perception: Attention and perception  normal. She does not perceive auditory or visual hallucinations.        Mood and Affect: Mood normal. Affect is blunt.        Speech: Speech normal.        Behavior: Behavior is cooperative.        Thought Content: Thought content is not paranoid or delusional. Thought content does not include homicidal or suicidal ideation. Thought content does not include homicidal or suicidal plan.        Cognition and Memory: Cognition and memory normal.        Judgment: Judgment normal.    Review of Systems  Constitutional:  Positive for weight loss.  Psychiatric/Behavioral:  Positive for depression and substance abuse.   All other systems reviewed and are negative.  Blood pressure (!) 126/94, pulse (!) 125, temperature 98.1 F (36.7 C), temperature source Oral, resp. rate 18, height 5' 4.5" (1.638 m), weight 67.1 kg, SpO2 100 %. Body mass index is 25.01 kg/m.  Treatment Plan Summary: Daily contact with patient to assess and evaluate symptoms and progress in treatment, Medication management, and Plan   PLAN:  Safety and Monitoring: Voluntary admission to inpatient psychiatric unit for safety, stabilization and treatment Daily contact with patient to assess and evaluate symptoms and progress in treatment Patient's case to be discussed in multi-disciplinary team meeting Observation Level : q15 minute checks Vital signs: q12 hours Precautions: suicide, but pt currently verbally contracts for safety on unit    Major depressive disorder: -- Fluoxetine 20 mg capsule p.o. daily   Schizoaffective disorder/bipolar disorder --Abilify 15 mg tablets 1 daily at bedtime   Anxiety --Hydroxyzine 25 mg 3 times daily as needed/anxiety --Gabapentin 300 mg caps 3 times daily daily   Insomnia -Trazodone 100 mg p.o. at bedtime as needed   GERD: -- Omeprazole 40 mg 1 p.o. daily   Hypertension: -- Lisinopril 10 mg p.o. daily   PTSD: -- Prazosin 1 mg p.o. daily   Other PRN Medications -Acetaminophen  650 mg every 6 as needed/mild pain -Maalox 30 mL oral every 4 as needed/digestion -Magnesium hydroxide 30 mL daily as needed/mild constipation --  Zofran disintegrating tabs 4 mg 1 every 6 hours as needed for nausea   Discharge Planning: Social work and case management to assist with discharge planning and identification of hospital follow-up needs prior to discharge Estimated LOS: 5-7 days Discharge Concerns: Need to establish a safety plan; Medication compliance and effectiveness Discharge Goals: Return home with outpatient referrals for mental health follow-up including medication management/psychotherapy.  Inda Merlin, NP 01/31/2022, 3:18 PM

## 2022-01-31 NOTE — Progress Notes (Signed)
   01/31/22 5093  15 Minute Checks  Location Bedroom  Visual Appearance Calm  Behavior Sleeping  Sleep (Behavioral Health Patients Only)  Calculate sleep? (Click Yes once per 24 hr at 0600 safety check) Yes  Documented sleep last 24 hours 5.5

## 2022-01-31 NOTE — Plan of Care (Signed)

## 2022-01-31 NOTE — Progress Notes (Addendum)
Patient refused am CBG check and am vitals. Jordan Hawks, NP notified. Patient states "I will let them check it next shift". Patient assessed for mental confusion, nausea and dizziness. Patient refused to answer questions at this time. Will report to oncoming shift RN.

## 2022-01-31 NOTE — [facility] Group Notes (Signed)
Pt did attend AA group  

## 2022-01-31 NOTE — BH IP Treatment Plan (Unsigned)
Interdisciplinary Treatment and Diagnostic Plan New  01/31/2022 Time of Session: Pass [facility] MRN: [id number]  Principal Diagnosis: MDD (major depressive disorder), recurrent episode, severe ([place])  Secondary Diagnoses: Principal Problem:   MDD (major depressive disorder), recurrent episode, severe ([place])   Current Medications:  Current Facility-Administered Medications  Medication Dose Route Frequency Provider Last Rate Last Admin   albuterol (VENTOLIN HFA) 108 (90 Base) MCG/ACT inhaler 2 puff  2 puff Inhalation Q6H PRN Onuoha, Chinwendu V, NP       alum & mag hydroxide-simeth (MAALOX/MYLANTA) 200-200-20 MG/5ML suspension 30 mL  30 mL Oral Q4H PRN Lucky Rathke, FNP       ARIPiprazole (ABILIFY) tablet 15 mg  15 mg Oral QHS Ntuen, Kris Hartmann, FNP   15 mg at 01/30/22 2123   FLUoxetine (PROZAC) capsule 20 mg  20 mg Oral Daily Ntuen, Kris Hartmann, FNP   20 mg at 01/31/22 1013   gabapentin (NEURONTIN) capsule 400 mg  400 mg Oral TID Laretta Bolster, FNP   400 mg at 01/31/22 1013   hydrOXYzine (ATARAX) tablet 25 mg  25 mg Oral TID PRN Lucky Rathke, FNP   25 mg at 01/30/22 2052   ibuprofen (ADVIL) tablet 400 mg  400 mg Oral Q6H PRN Laretta Bolster, FNP   400 mg at 01/31/22 1014   insulin aspart (novoLOG) injection 0-15 Units  0-15 Units Subcutaneous TID WC Lucky Rathke, FNP   8 Units at 01/30/22 1800   insulin aspart (novoLOG) injection 0-5 Units  0-5 Units Subcutaneous QHS Onuoha, Chinwendu V, NP       insulin glargine-yfgn (SEMGLEE) injection 10 Units  10 Units Subcutaneous Daily Massengill, Ovid Curd, MD   10 Units at 01/31/22 1020   lisinopril (ZESTRIL) tablet 10 mg  10 mg Oral Daily Ntuen, Kris Hartmann, FNP   10 mg at 01/31/22 1013   risperiDONE (RISPERDAL M-TABS) disintegrating tablet 2 mg  2 mg Oral Q8H PRN Ntuen, Kris Hartmann, FNP       And   LORazepam (ATIVAN) tablet 1 mg  1 mg Oral PRN Ntuen, Kris Hartmann, FNP       And   ziprasidone (GEODON) injection 20 mg  20 mg Intramuscular PRN Ntuen, Kris Hartmann, FNP        magnesium hydroxide (MILK OF MAGNESIA) suspension 30 mL  30 mL Oral Daily PRN Lucky Rathke, FNP       nicotine (NICODERM CQ - dosed in mg/24 hours) patch 14 mg  14 mg Transdermal Daily Lucky Rathke, FNP   14 mg at 01/31/22 1016   ondansetron (ZOFRAN-ODT) disintegrating tablet 4 mg  4 mg Oral Q8H PRN Ntuen, Kris Hartmann, FNP       prazosin (MINIPRESS) capsule 1 mg  1 mg Oral QHS Ntuen, Kris Hartmann, FNP   1 mg at 01/30/22 2123   traZODone (DESYREL) tablet 100 mg  100 mg Oral QHS PRN Ntuen, Kris Hartmann, FNP       PTA Medications: Medications Prior to Admission  Medication Sig Dispense Refill Last Dose   albuterol (VENTOLIN HFA) 108 (90 Base) MCG/ACT inhaler Inhale 1-2 puffs into the lungs every 6 (six) hours as needed for wheezing.   Past Month   gabapentin (NEURONTIN) 300 MG capsule Take 600 mg by mouth 3 (three) times daily.   Past Week   Insulin Glargine w/ Trans Port 100 UNIT/ML SOPN Inject 14 Units into the skin daily.   Past Week   insulin lispro (HUMALOG)  100 UNIT/ML injection Inject 1-10 Units into the skin 3 (three) times daily as needed for high blood sugar (sliding scale has paper at home does not know dosing).   Past Month   Naproxen Sodium (ALEVE) 220 MG CAPS Take 1 capsule by mouth daily. Take 2 caps 4 times daily as needed.   Past Week   omeprazole (PRILOSEC) 40 MG capsule Take 40 mg by mouth daily.   Past Week   ondansetron (ZOFRAN-ODT) 4 MG disintegrating tablet Take 4 mg by mouth every 6 (six) hours as needed for nausea.   Past Month   oxyCODONE-acetaminophen (PERCOCET/ROXICET) 5-325 MG tablet Take 1 tablet by mouth every 4 (four) hours as needed for moderate pain.   Past Month    Patient Stressors: Financial difficulties   Occupational concerns    Patient Strengths: Motivation for treatment/growth   Treatment Modalities: Medication Management, Group therapy, Case management,  1 to 1 session with clinician, Psychoeducation, Recreational therapy.   Physician Treatment Plan for Primary  Diagnosis: MDD (major depressive disorder), recurrent episode, severe ([place]) Long Term Goal(s): Improvement in symptoms so as ready for discharge   Short Term Goals: Ability to identify changes in lifestyle to reduce recurrence of condition will improve Ability to verbalize feelings will improve Ability to disclose and discuss suicidal ideas Ability to demonstrate self-control will improve Ability to identify and develop effective coping behaviors will improve Ability to maintain clinical measurements within normal limits will improve Compliance with prescribed medications will improve Ability to identify triggers associated with substance abuse/mental health issues will improve  Medication Management: Evaluate patient's response, side effects, and tolerance of medication regimen.  Therapeutic Interventions: 1 to 1 sessions, Unit Group sessions and Medication administration.  Evaluation of Outcomes: Progressing  Physician Treatment Plan for Secondary Diagnosis: Principal Problem:   MDD (major depressive disorder), recurrent episode, severe ([place])  Long Term Goal(s): Improvement in symptoms so as ready for discharge   Short Term Goals: Ability to identify changes in lifestyle to reduce recurrence of condition will improve Ability to verbalize feelings will improve Ability to disclose and discuss suicidal ideas Ability to demonstrate self-control will improve Ability to identify and develop effective coping behaviors will improve Ability to maintain clinical measurements within normal limits will improve Compliance with prescribed medications will improve Ability to identify triggers associated with substance abuse/mental health issues will improve     Medication Management: Evaluate patient's response, side effects, and tolerance of medication regimen.  Therapeutic Interventions: 1 to 1 sessions, Unit Group sessions and Medication administration.  Evaluation of Outcomes:  Progressing   RN Treatment Plan for Primary Diagnosis: MDD (major depressive disorder), recurrent episode, severe ([place]) Long Term Goal(s): Knowledge of disease and therapeutic regimen to maintain health will improve  Short Term Goals: Ability to remain free from injury will improve, Ability to verbalize frustration and anger appropriately will improve, Ability to demonstrate self-control, Ability to participate in decision making will improve, Ability to verbalize feelings will improve, Ability to disclose and discuss suicidal ideas, Ability to identify and develop effective coping behaviors will improve, and Compliance with prescribed medications will improve  Medication Management: RN will administer medications as ordered by provider, will assess and evaluate patient's response and provide education to patient for prescribed medication. RN will report any adverse and/or side effects to prescribing provider.  Therapeutic Interventions: 1 on 1 counseling sessions, Psychoeducation, Medication administration, Evaluate responses to treatment, Monitor vital signs and CBGs as ordered, Perform/monitor CIWA, COWS, AIMS and Fall Risk screenings as  ordered, Perform wound care treatments as ordered.  Evaluation of Outcomes: Progressing   LCSW Treatment Plan for Primary Diagnosis: MDD (major depressive disorder), recurrent episode, severe ([facility]) Long Term Goal(s): Safe transition to appropriate next level of care at discharge, Engage patient in therapeutic group addressing interpersonal concerns.  Short Term Goals: Engage patient in aftercare planning with referrals and resources, Increase social support, Increase ability to appropriately verbalize feelings, Increase emotional regulation, Facilitate acceptance of mental health diagnosis and concerns, Facilitate patient progression through stages of change regarding substance use diagnoses and concerns, Identify triggers associated with mental health/substance  abuse issues, and Increase skills for wellness and recovery  Therapeutic Interventions: Assess for all discharge needs, 1 to 1 time with Social worker, Explore available resources and support systems, Assess for adequacy in community support network, Educate family and significant other(s) on suicide prevention, Complete Psychosocial Assessment, Interpersonal group therapy.  Evaluation of Outcomes: Progressing   Progress in Treatment: Attending groups: No. Participating in groups: No. Taking medication as prescribed: No. Toleration medication: No. Family/Significant other contact made: Yes, individual(s) contacted:  (Son) Mazikeen Hehn [phone] Patient understands diagnosis: {[facility] NWGNF:62130} Discussing patient identified problems/goals with staff: {[facility] QMVHQ:46962} Medical problems stabilized or resolved: {[facility] ADULT:22608} Denies suicidal/homicidal ideation: {[facility] ADULT:22608} Issues/concerns per patient self-inventory: {[facility] ADULT:22608} Other: ***  New problem(s) identified: {[facility] NEW PROBLEMS:22609}  New Short Term/Long Term Goal(s):  Patient Goals:    Discharge Plan or Barriers:   Reason for Continuation of Hospitalization: {[facility] Reasons for continued hospitalization:22604}  Estimated Length of Stay:  Last 3 [place] Suicide Severity Risk Score: Flowsheet Row Ad[facility] (Current) [facility] from 03/06/2020 in [facility]  C-SSRS RISK CATEGORY High Risk No Risk       Last PHQ 2/9 Scores:     No data to display          Scribe for Treatment Team: Ane Payment, LCSW 01/31/2022 11:08 AM

## 2022-01-31 NOTE — Progress Notes (Signed)
Received report from patient's nurse via secure chat the patient's blood sugar was checked at bedtime and it was 294. Patient at the time did not have bedtime insulin coverage.  Patient's nurse put in a request for HS insulin coverage and order was placed.  Due to time lapse from initial blood sugar check, patient's blood sugar was rechecked at 0046 am and CBG was 322.  Patient's nurse proceeded to offer her insulin coverage for 322 blood sugar but patient vehemently refused, stating "she is trying to sleep, and its going to bottom out when she gets up in the am if she receives insulin coverage. Patient was educated on the dose, and benefits/importance of receiving her insulin, and she replied with " I don't care and started getting mad".   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 Patient was also noted to be upset about her pain meds not scheduled, and subsequently refused her BP meds and refused vital signs.   Patient also reported to her nurse tonight that she was [place] stop eating after refusing her insulin. Patient informed she would have to be sent off to the ED for medical clearance due to her elevated blood sugar and refusing treatment.  Patient reported " I'm not going to the hospital, if they send me out and put their hands on me, I'm smacking the f**k out of everybody". Patient reports she will not go to the ED and will act out and is making threats to staff.  I spoke to Vonzella Nipple, RN and [facility], who both report the patient stated she is mad because she was woken up late, and did not receive insulin coverage when her blood sugar was initially 294. They both stated the patient reports she only takes insulin three times daily with no bedtime coverage, and has agreed to take her insulin shot in 4 hours.   Patient's is  asymptomatic at this time, nurse to monitor for mental confusion, nausea or dizziness and report to provider.

## 2022-02-01 LAB — COMPREHENSIVE METABOLIC PANEL
ALT: 16 U/L (ref 0–44)
AST: 13 U/L — ABNORMAL LOW (ref 15–41)
Albumin: 3.6 g/dL (ref 3.5–5.0)
Alkaline Phosphatase: 53 U/L (ref 38–126)
Anion gap: 9 (ref 5–15)
BUN: 8 mg/dL (ref 6–20)
CO2: 22 mmol/L (ref 22–32)
Calcium: 9.3 mg/dL (ref 8.9–10.3)
Chloride: 103 mmol/L (ref 98–111)
Creatinine, Ser: 0.53 mg/dL (ref 0.44–1.00)
GFR, Estimated: >60 mL/min (ref >60)
Glucose, Bld: 316 mg/dL — ABNORMAL HIGH (ref 70–99)
Potassium: 4.2 mmol/L (ref 3.5–5.1)
Sodium: 134 mmol/L — ABNORMAL LOW (ref 135–145)
Total Bilirubin: 0.6 mg/dL (ref 0.3–1.2)
Total Protein: 7.3 g/dL (ref 6.5–8.1)

## 2022-02-01 LAB — GLUCOSE, CAPILLARY
Glucose-Capillary: 272 mg/dL — ABNORMAL HIGH (ref 70–99)
Glucose-Capillary: 281 mg/dL — ABNORMAL HIGH (ref 70–99)
Glucose-Capillary: 299 mg/dL — ABNORMAL HIGH (ref 70–99)
Glucose-Capillary: 262 mg/dL — ABNORMAL HIGH (ref 70–99)

## 2022-02-01 LAB — LIPID PANEL
Cholesterol: 204 mg/dL — ABNORMAL HIGH (ref 0–200)
HDL: 61 mg/dL (ref >40)
LDL Cholesterol: 131 mg/dL — ABNORMAL HIGH (ref 0–99)
Total CHOL/HDL Ratio: 3.3 RATIO
Triglycerides: 61 mg/dL (ref <150)
VLDL: 12 mg/dL (ref 0–40)

## 2022-02-01 MED ORDER — PANTOPRAZOLE SODIUM 40 MG PO TBEC
40.0000 mg | DELAYED_RELEASE_TABLET | Freq: Every day | ORAL | Status: DC
  Administered 2022-02-01 – 2022-02-07 (×7): 40 mg via ORAL
  Filled 2022-02-01 (×9): qty 1

## 2022-02-01 MED ORDER — SENNA 8.6 MG PO TABS
1.0000 | ORAL_TABLET | Freq: Every day | ORAL | Status: DC | PRN
  Administered 2022-02-01: 8.6 mg via ORAL
  Filled 2022-02-01: qty 1

## 2022-02-01 MED ORDER — DOCUSATE SODIUM 100 MG PO CAPS
100.0000 mg | ORAL_CAPSULE | Freq: Two times a day (BID) | ORAL | Status: DC | PRN
  Administered 2022-02-01 (×2): 100 mg via ORAL
  Filled 2022-02-01 (×2): qty 1

## 2022-02-01 NOTE — Progress Notes (Signed)
[facility]  02/01/2022 10:15 AM Sara PulsSacha D [name]  MRN:  [id number] Subjective:  "I was doing alright until staff came in here last night harassing me saying I have to take insulin at 0130 in the morning'.  Principal Problem: MDD (major depressive disorder), recurrent episode, severe (HCC) Diagnosis: Principal Problem:   MDD (major depressive disorder), recurrent episode, severe (HCC)  HPI: Sara [name] is a 47 year old African American female with a past psychiatric diagnoses of schizoaffective disorder, bipolar, and major depressive disorder recurrent severe who [facility] for worsening depression and suicidal thoughts. Patient noted to have multiple medical co-morbidities including diabetes, sickle cell trait, hypertension, hyperlipidemia mixed. On initial assessment patient reported increased stressors related to homelessness, substance abuse (cocaine and marijuana), family issues (recently asked to leave son's home), and increased depression with suicidal ideations where she reports intentionally stopped eating and taking her insulin and eating to induce diabetic coma for suicide. She reports significant weight-loss as a result.   24 hr assessment: Patient visible in milieu participating in unit activities and groups. Had some reports of 'burning pain' in her pack and legs overnight. Received PRN Ibuprofen and heat packs; remains on scheduled Gabapentin 400 mg TID. CBGs remain elevated; 0600 272. Patient remains on carb modified diet. Slept 7.75 hours overnight.   Assessment: Patient assessed in her room where she initially presented laying down. She presented casual appearance, good eye contact. Alert and oriented. She reports constipation. She rates her anxiety 7/10 related to peers on the unit being disruptive and depression as 5/10. Patient reports attending unit groups. Reports interest in possible [facility]. Expressed some fear of 'falling back into same trap' regarding substance abuse and feels 28 day program may be best [facility]). Reports ongoing contact with sons; mostly son Sara [name]. Denies any thoughts of wanting to harm herself or anyone, auditory or visual hallucinations. Contracts for safety and states plan to increase unit engagement.   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 pylori infection    High risk medication use    Hypercholesterolemia    Hyperlipidemia, mixed    Hypertension    Other mixed anxiety disorders    Pollen allergies    Sickle cell trait (HCC)    Sleep apnea    Vitamin D deficiency     Past Surgical History:  Procedure Laterality Date   ABDOMINAL HYSTERECTOMY     APPENDECTOMY     COLONOSCOPY  08-16-2008   Dr. Vallarie MareBulter---normal   ESOPHAGOGASTRODUODENOSCOPY  08-16-2008   Dr. Rayfield CitizenBulter    Family History:  Family History  Problem Relation Age of Onset   Clotting disorder Mother    Crohn's disease Mother    Heart disease Mother    Colon cancer Father    Clotting disorder Brother    Diabetes Brother    Diabetes Maternal Aunt    Liver cancer Maternal Uncle    Prostate cancer Maternal Uncle    Colon cancer Maternal Uncle    Diabetes Paternal Aunt    Esophageal cancer Paternal Uncle    Family Psychiatric History: not noted Social History:  Social History   Substance and Sexual Activity  Alcohol Use None     Social History   Substance and Sexual Activity  Drug Use Yes   Types: Marijuana    Social History   Socioeconomic History   Marital status: Married    Spouse name: Not on file   Number of children: 2   Years of education: Not on file   Highest education level: Not on file  Occupational History    Occupation: n/a  Tobacco Use   Smoking status: Every Day    Packs/day: 1.00    Types: Cigarettes   Smokeless tobacco: Never  Substance and Sexual Activity   Alcohol use: Not on file   Drug use: Yes    Types: Marijuana   Sexual activity: Not on file  Other Topics Concern   Not on file  Social History Narrative   Not on file   Social Determinants of Health   Financial Resource Strain: Not on file  Food Insecurity: Food Insecurity Present (01/30/2022)   Hunger Vital Sign    Worried About Running Out of Food in the Last Year: Often true    Ran Out of Food in the Last Year: Often true  Transportation Needs: Unknown (01/30/2022)   PRAPARE - Administrator, Civil Service (Medical): Patient refused    Lack of Transportation (Non-Medical): Patient refused  Physical Activity: Not on file  Stress: Not on file  Social Connections: Not on file   Additional Social History:   Sleep: Fair  Appetite:  Good  Current Medications: Current Facility-Administered Medications  Medication Dose Route Frequency Provider Last Rate Last Admin   albuterol (VENTOLIN HFA) 108 (90 Base) MCG/ACT inhaler 2 puff  2 puff Inhalation Q6H PRN Onuoha, Chinwendu V, NP   2 puff at 01/31/22 1648   alum & mag hydroxide-simeth (MAALOX/MYLANTA) 200-200-20 MG/5ML suspension 30 mL  30 mL Oral Q4H PRN Lenard Lance, FNP       ARIPiprazole (ABILIFY) tablet 15 mg  15 mg Oral QHS Ntuen, Tina C, FNP   15 mg at 01/31/22 2147   docusate sodium (COLACE) capsule 100 mg  100 mg Oral BID PRN Leevy-Johnson, Lashay Osborne A, NP       FLUoxetine (PROZAC) capsule 20 mg  20 mg Oral Daily Ntuen, Tina C, FNP   20 mg at 02/01/22 0813   gabapentin (NEURONTIN) capsule 400 mg  400 mg Oral TID Cecilie Lowers, FNP   400 mg at 02/01/22 0813   hydrOXYzine (ATARAX) tablet 25 mg  25 mg Oral TID PRN Lenard Lance, FNP   25 mg at 01/30/22 2052   ibuprofen (ADVIL) tablet 400 mg  400 mg Oral Q6H PRN Alan Mulder C, FNP   400 mg at 02/01/22 0813    insulin aspart (novoLOG) injection 0-15 Units  0-15 Units Subcutaneous TID WC Lenard Lance, FNP   8 Units at 02/01/22 0641   insulin aspart (novoLOG) injection 0-5 Units  0-5 Units Subcutaneous QHS Onuoha, Chinwendu V, NP   5 Units at 01/31/22 2150   insulin glargine-yfgn (SEMGLEE) injection 10 Units  10 Units Subcutaneous Daily Massengill, Harrold Donath, MD   10 Units at 02/01/22 0816   lisinopril (ZESTRIL) tablet 10 mg  10 mg Oral Daily Ntuen, Jesusita Oka, FNP   10 mg at 02/01/22 0813   risperiDONE (RISPERDAL M-TABS) disintegrating tablet 2 mg  2 mg Oral Q8H PRN Ntuen, Jesusita Oka, FNP       And  LORazepam (ATIVAN) tablet 1 mg  1 mg Oral PRN Ntuen, Kris Hartmann, FNP       And   ziprasidone (GEODON) injection 20 mg  20 mg Intramuscular PRN Ntuen, Kris Hartmann, FNP       magnesium hydroxide (MILK OF MAGNESIA) suspension 30 mL  30 mL Oral Daily PRN Lucky Rathke, FNP       nicotine (NICODERM CQ - dosed in mg/24 hours) patch 14 mg  14 mg Transdermal Daily Lucky Rathke, FNP   14 mg at 02/01/22 0813   ondansetron (ZOFRAN-ODT) disintegrating tablet 4 mg  4 mg Oral Q8H PRN Ntuen, Kris Hartmann, FNP       prazosin (MINIPRESS) capsule 1 mg  1 mg Oral QHS Ntuen, Tina C, FNP   1 mg at 01/31/22 2146   traZODone (DESYREL) tablet 100 mg  100 mg Oral QHS PRN Ntuen, Kris Hartmann, FNP        Lab Results:  Results for orders placed or performed during the hospital encounter of 01/29/22 (from the past 48 hour(s))  Glucose, capillary     Status: Abnormal   Collection Time: 01/30/22 12:07 PM  Result Value Ref Range   Glucose-Capillary 271 (H) 70 - 99 mg/dL    Comment: Glucose reference range applies only to samples taken after fasting for at least 8 hours.  Glucose, capillary     Status: Abnormal   Collection Time: 01/30/22  5:18 PM  Result Value Ref Range   Glucose-Capillary 255 (H) 70 - 99 mg/dL    Comment: Glucose reference range applies only to samples taken after fasting for at least 8 hours.  Urinalysis, Complete w Microscopic Urine, Clean  Catch     Status: Abnormal   Collection Time: 01/30/22  5:29 PM  Result Value Ref Range   Color, Urine STRAW (A) YELLOW   APPearance CLEAR CLEAR   Specific Gravity, Urine 1.008 1.005 - 1.030   pH 5.0 5.0 - 8.0   Glucose, UA >=500 (A) NEGATIVE mg/dL   Hgb urine dipstick SMALL (A) NEGATIVE   Bilirubin Urine NEGATIVE NEGATIVE   Ketones, ur NEGATIVE NEGATIVE mg/dL   Protein, ur NEGATIVE NEGATIVE mg/dL   Nitrite NEGATIVE NEGATIVE   Leukocytes,Ua NEGATIVE NEGATIVE   RBC / HPF 0-5 0 - 5 RBC/hpf   WBC, UA 0-5 0 - 5 WBC/hpf   Bacteria, UA NONE SEEN NONE SEEN   Squamous Epithelial / HPF 0-5 0 - 5 /HPF   Mucus PRESENT    Hyaline Casts, UA PRESENT     Comment: Performed at [facility], [place] [address], [place],  [address]  Rapid urine drug screen (hospital performed)     Status: Abnormal   Collection Time: 01/30/22  5:29 PM  Result Value Ref Range   Opiates NONE DETECTED NONE DETECTED   Cocaine NONE DETECTED NONE DETECTED   Benzodiazepines NONE DETECTED NONE DETECTED   Amphetamines NONE DETECTED NONE DETECTED   Tetrahydrocannabinol POSITIVE (A) NONE DETECTED   Barbiturates NONE DETECTED NONE DETECTED    Comment: (NOTE) DRUG SCREEN FOR MEDICAL PURPOSES ONLY.  IF CONFIRMATION IS NEEDED FOR ANY PURPOSE, NOTIFY LAB WITHIN 5 DAYS.  LOWEST DETECTABLE LIMITS FOR URINE DRUG SCREEN Drug Class                     Cutoff (ng/mL) Amphetamine and metabolites    1000 Barbiturate and metabolites    200 Benzodiazepine  200 Opiates and metabolites        300 Cocaine and metabolites        300 THC                            50 Performed at [facility], [address], [place], [place] [address]   Glucose, capillary     Status: Abnormal   Collection Time: 01/30/22  8:51 PM  Result Value Ref Range   Glucose-Capillary 294 (H) 70 - 99 mg/dL    Comment: Glucose reference range applies only to samples taken after fasting for at least 8  hours.   Comment 1 Notify RN    Comment 2 Document in Chart   Glucose, capillary     Status: Abnormal   Collection Time: 01/31/22 12:40 AM  Result Value Ref Range   Glucose-Capillary 322 (H) 70 - 99 mg/dL    Comment: Glucose reference range applies only to samples taken after fasting for at least 8 hours.   Comment 1 Notify RN   Glucose, capillary     Status: Abnormal   Collection Time: 01/31/22 10:18 AM  Result Value Ref Range   Glucose-Capillary 278 (H) 70 - 99 mg/dL    Comment: Glucose reference range applies only to samples taken after fasting for at least 8 hours.  Glucose, capillary     Status: Abnormal   Collection Time: 01/31/22 12:06 PM  Result Value Ref Range   Glucose-Capillary 347 (H) 70 - 99 mg/dL    Comment: Glucose reference range applies only to samples taken after fasting for at least 8 hours.  Glucose, capillary     Status: Abnormal   Collection Time: 01/31/22  5:15 PM  Result Value Ref Range   Glucose-Capillary 212 (H) 70 - 99 mg/dL    Comment: Glucose reference range applies only to samples taken after fasting for at least 8 hours.  Glucose, capillary     Status: Abnormal   Collection Time: 01/31/22  9:39 PM  Result Value Ref Range   Glucose-Capillary 353 (H) 70 - 99 mg/dL    Comment: Glucose reference range applies only to samples taken after fasting for at least 8 hours.  Glucose, capillary     Status: Abnormal   Collection Time: 02/01/22  6:14 AM  Result Value Ref Range   Glucose-Capillary 272 (H) 70 - 99 mg/dL    Comment: Glucose reference range applies only to samples taken after fasting for at least 8 hours.  Comprehensive metabolic panel     Status: Abnormal   Collection Time: 02/01/22  6:48 AM  Result Value Ref Range   Sodium 134 (L) 135 - 145 mmol/L   Potassium 4.2 3.5 - 5.1 mmol/L   Chloride 103 98 - 111 mmol/L   CO2 22 22 - 32 mmol/L   Glucose, Bld 316 (H) 70 - 99 mg/dL    Comment: Glucose reference range applies only to samples taken after  fasting for at least 8 hours.   BUN 8 6 - 20 mg/dL   Creatinine, Ser 5.27 0.44 - 1.00 mg/dL   Calcium 9.3 8.9 - 78.2 mg/dL   Total Protein 7.3 6.5 - 8.1 g/dL   Albumin 3.6 3.5 - 5.0 g/dL   AST 13 (L) 15 - 41 U/L   ALT 16 0 - 44 U/L   Alkaline Phosphatase 53 38 - 126 U/L   Total Bilirubin 0.6 0.3 - 1.2 mg/dL  GFR, Estimated >60 >60 mL/min    Comment: (NOTE) Calculated using the CKD-EPI Creatinine Equation (2021)    Anion gap 9 5 - 15    Comment: Performed at [facility], [address], [place], [place] [address]  Lipid panel     Status: Abnormal   Collection Time: 02/01/22  6:48 AM  Result Value Ref Range   Cholesterol 204 (H) 0 - 200 mg/dL   Triglycerides 61 <062 mg/dL   HDL 61 >69 mg/dL   Total CHOL/HDL Ratio 3.3 RATIO   VLDL 12 0 - 40 mg/dL   LDL Cholesterol 485 (H) 0 - 99 mg/dL    Comment:        Total Cholesterol/HDL:CHD Risk Coronary Heart Disease Risk Table                     Men   Women  1/2 Average Risk   3.4   3.3  Average Risk       5.0   4.4  2 X Average Risk   9.6   7.1  3 X Average Risk  23.4   11.0        Use the calculated Patient Ratio above and the CHD Risk Table to determine the patient's CHD Risk.        ATP III CLASSIFICATION (LDL):  <100     mg/dL   Optimal  462-703  mg/dL   Near or Above                    Optimal  130-159  mg/dL   Borderline  500-938  mg/dL   High  >182     mg/dL   Very High Performed at [facility], [address], [place], [place] [address]     Blood Alcohol level:  Lab Results  Component Value Date   [facility]  12/11/2009    <5        LOWEST DETECTABLE LIMIT FOR SERUM ALCOHOL IS 5 mg/dL FOR MEDICAL PURPOSES ONLY    Metabolic Disorder Labs: No results found for: "HGBA1C", "MPG" No results found for: "PROLACTIN" Lab Results  Component Value Date   CHOL 204 (H) 02/01/2022   TRIG 61 02/01/2022   HDL 61 02/01/2022   CHOLHDL 3.3 02/01/2022   VLDL 12 02/01/2022   LDLCALC 131  (H) 02/01/2022    Physical Findings: AIMS:  , ,  ,  ,    CIWA:    COWS:     Musculoskeletal: Strength & Muscle Tone: within normal limits Gait & Station: normal Patient leans: N/A  Psychiatric Specialty Exam:  Presentation  General Appearance:  Casual; Appropriate for Environment  Eye Contact: Good  Speech: Clear and Coherent  Speech Volume: Normal  Handedness: Right  Mood and Affect  Mood: Euthymic  Affect: Congruent; Appropriate  Thought Process  Thought Processes: Coherent; Goal Directed  Descriptions of Associations:Intact  Orientation:Full (Time, Place and Person)  Thought Content:Logical  History of Schizophrenia/Schizoaffective disorder:No data recorded Duration of Psychotic Symptoms:No data recorded Hallucinations:Hallucinations: None  Ideas of Reference:None  Suicidal Thoughts:Suicidal Thoughts: No  Homicidal Thoughts:Homicidal Thoughts: No  Sensorium  Memory: Immediate Good; Recent Good  Judgment: Good  Insight: Good  Executive Functions  Concentration: Good  Attention Span: Good  Recall: Good  Fund of Knowledge: Good  Language: Good  Psychomotor Activity  Psychomotor Activity: Psychomotor Activity: Normal  Assets  Assets: Communication Skills; Resilience; Desire for Improvement  Sleep  Sleep: Sleep:  Good  Physical Exam: Physical Exam Vitals and nursing note reviewed.  Constitutional:      Appearance: She is normal weight. She is not ill-appearing.  HENT:     Head: Normocephalic.     Nose: Nose normal.     Mouth/Throat:     Mouth: Mucous membranes are moist.     Pharynx: Oropharynx is clear.  Eyes:     Pupils: Pupils are equal, round, and reactive to light.  Cardiovascular:     Rate and Rhythm: Normal rate.     Pulses: Normal pulses.  Pulmonary:     Effort: Pulmonary effort is normal.  Abdominal:     Palpations: Abdomen is soft.  Musculoskeletal:        General: Normal range of motion.      Cervical back: Normal range of motion.  Skin:    General: Skin is warm and dry.  Neurological:     Mental Status: She is alert and oriented to person, place, and time.  Psychiatric:        Attention and Perception: Attention and perception normal. She does not perceive auditory or visual hallucinations.        Mood and Affect: Mood normal. Affect is blunt.        Speech: Speech normal.        Behavior: Behavior is cooperative.        Thought Content: Thought content is not paranoid or delusional. Thought content does not include homicidal or suicidal ideation. Thought content does not include homicidal or suicidal plan.        Cognition and Memory: Cognition and memory normal.        Judgment: Judgment normal.    Review of Systems  Constitutional:  Positive for weight loss.  Psychiatric/Behavioral:  Positive for substance abuse.   All other systems reviewed and are negative.  Blood pressure 114/84, pulse (!) 124, temperature 98.7 F (37.1 C), temperature source Oral, resp. rate 18, height 5' 4.5" (1.638 m), weight 67.1 kg, SpO2 100 %. Body mass index is 25.01 kg/m.  Treatment Plan Summary: Daily contact with patient to assess and evaluate symptoms and progress in treatment, Medication management, and Plan   PLAN:  Safety and Monitoring: Voluntary admission to inpatient psychiatric unit for safety, stabilization and treatment Daily contact with patient to assess and evaluate symptoms and progress in treatment Patient's case to be discussed in multi-disciplinary team meeting Observation Level : q15 minute checks Vital signs: q12 hours Precautions: suicide, but pt currently verbally contracts for safety on unit    Major depressive disorder: -- Fluoxetine 20 mg capsule p.o. daily   Schizoaffective disorder/bipolar disorder --Abilify 15 mg tablets 1 daily at bedtime   Anxiety --Hydroxyzine 25 mg 3 times daily as needed/anxiety --Gabapentin 300 mg caps 3 times daily daily    Insomnia -Trazodone 100 mg p.o. at bedtime as needed   GERD: -- Omeprazole 40 mg 1 p.o. daily  Constipation:  -- Colace 100 mg BID PRN  Hypertension: -- Lisinopril 10 mg p.o. daily   PTSD: -- Prazosin 1 mg p.o. daily   Other PRN Medications -Acetaminophen 650 mg every 6 as needed/mild pain -Maalox 30 mL oral every 4 as needed/digestion -Magnesium hydroxide 30 mL daily as needed/mild constipation --Zofran disintegrating tabs 4 mg 1 every 6 hours as needed for nausea   Discharge Planning: Social work and case management to assist with discharge planning and identification of hospital follow-up needs prior to discharge Estimated LOS: 5-7 days Discharge Concerns:  Need to establish a safety plan; Medication compliance and effectiveness Discharge Goals: Return home with outpatient referrals for mental health follow-up including medication management/psychotherapy.  Loletta Parish, NP 02/01/2022, 10:15 AMPatient ID: Sara [name], female   DOB: 02-15-1975, 47 y.o.   MRN: [id number]

## 2022-02-01 NOTE — BHH Group Notes (Signed)
.  Psychoeducational Group Note    Date:  02/01/2022 Time:1300-1400    Purpose of Group: . The group focus' on teaching patients on how to identify their needs and their Life Skills:  A group where two lists are made. What people need and what are things that we do that are unhealthy. The lists are developed by the patients and it is explained that we often do the actions that are not healthy to get our list of needs met.  Goal:: to develop the coping skills needed to get their needs met  Participation Level:  did not attend Additional Comments:    Sara [name] A  

## 2022-02-01 NOTE — Progress Notes (Signed)
   02/01/22 0813  Psych Admission Type (Psych Patients Only)  Admission Status Voluntary  Psychosocial Assessment  Patient Complaints Other (Comment);Shakiness (dizziness)  Eye Contact Brief  Facial Expression Animated  Affect Anxious  Speech Logical/coherent  Interaction Assertive  Motor Activity Slow  Appearance/Hygiene In hospital gown  Behavior Characteristics Anxious  Mood Irritable  Thought Process  Coherency WDL  Content Blaming others  Delusions None reported or observed  Perception WDL  Hallucination None reported or observed  Judgment Poor  Confusion None  Danger to Self  Current suicidal ideation? Denies  Danger to Others  Danger to Others None reported or observed

## 2022-02-01 NOTE — Progress Notes (Signed)
   02/01/22 0600  15 Minute Checks  Location Bedroom  Visual Appearance Calm  Behavior Sleeping  Sleep (Behavioral Health Patients Only)  Calculate sleep? (Click Yes once per 24 hr at 0600 safety check) Yes  Documented sleep last 24 hours 7.75    

## 2022-02-01 NOTE — Progress Notes (Signed)
   02/01/22 2220  Psych Admission Type (Psych Patients Only)  Admission Status Voluntary  Psychosocial Assessment  Patient Complaints Depression  Eye Contact Brief  Facial Expression Animated  Affect Appropriate to circumstance  Speech Logical/coherent  Interaction Assertive  Motor Activity Slow  Appearance/Hygiene In hospital gown  Behavior Characteristics Appropriate to situation  Mood Depressed  Thought Process  Coherency WDL  Content WDL  Delusions None reported or observed  Perception WDL  Hallucination None reported or observed  Judgment Poor  Confusion None  Danger to Self  Current suicidal ideation? Denies  Self-Injurious Behavior No self-injurious ideation or behavior indicators observed or expressed   Agreement Not to Harm Self Yes  Description of Agreement verbal  Danger to Others  Danger to Others None reported or observed  Danger to Others Abnormal  Harmful Behavior to others No threats or harm toward other people  Destructive Behavior No threats or harm toward property

## 2022-02-01 NOTE — BHH Group Notes (Signed)
[facility] Group Notes:  (Nursing/MHT/Case Management/Adjunct)  Date:  02/01/2022  Time:  9:36 AM    Group Topic/Focus:  Goals Group:   The focus of this group is to help patients establish daily goals to achieve during treatment and discuss how the patient can incorporate goal setting into their daily lives to aide in recovery.  Participation Level:  Active  Participation Quality:  Appropriate  Affect:  Appropriate  Cognitive:  Appropriate  Insight:  Appropriate  Engagement in Group:  Engaged  Modes of Intervention:  Discussion  Summary of Progress/Problems: Patient goal for today is get something for constipation.  Alric Seton 02/01/2022, 9:36 AM

## 2022-02-01 NOTE — Progress Notes (Deleted)
     Pt c/o of headache 10/10 this afternoon.  Pt said "I really don't want to take this insulin.Marland Kitchen  it gives a bad headache, and I really feel dizzy after I take it.  Pt's blood sugar is 299.  Pt agreed to taking the 8 u of novolog before eating dinner.  NP notified of above.

## 2022-02-01 NOTE — Progress Notes (Signed)
Pt c/o of headache 10/10 this afternoon.  Pt said "I really don't want to take this insulin.Marland Kitchenit gives a bad headache, and I really feel dizzy after I take it.  Pt's blood sugar is 299.  Pt agreed to taking the 8 u of novolog before eating dinner.   NP notified of above.

## 2022-02-01 NOTE — Group Note (Signed)
LCSW Group Therapy Note  11/20/2018   10:00-11:00am   Type of Therapy and Topic:  Group Therapy: Anger Analysis Using CBT  Participation Level:  Did Not Attend   Description of Group:   In this group, patients learned how to recognize the physical, cognitive, emotional, and behavioral responses they have to anger-provoking situations.  They identified a recent time they became angry and how they reacted.  They identified the thoughts they had at the time they last were angry, and how this influenced their subsequent actions.  The group then explored how Cognitive Behavioral analysis can help change outcomes.  Therapeutic Goals: Patients will remember his/her last incident of anger and the surrounding circumstances Patients will identify how they felt emotionally and physically, what their thoughts were at the time, and what actions they took Patients will explore possible changes in their feelings and actions if their thoughts had been analyzed and actually found to be inaccurate or unhelpful.  Summary of Patient Progress:  N/A  Therapeutic Modalities:   Cognitive Behavioral Therapy  Maretta Los, MSW, [place]

## 2022-02-01 NOTE — [facility] Group Notes (Signed)
Goals Group 02/01/22   Group Focus: affirmation, clarity of thought, and goals/reality orientation Treatment Modality:  Psychoeducation Interventions utilized were assignment, group exercise, and support Purpose: To be able to understand and verbalize the reason for their admission to the hospital. To understand that the medication helps with their chemical imbalance but they also need to work on their choices in life. To be challenged to develop a list of 30 positives about themselves. Also introduce the concept that "feelings" are not reality.  Participation Level:  did not attend  Casmira Cramer A 

## 2022-02-01 NOTE — Progress Notes (Signed)
   01/31/22 2055  Psych Admission Type (Psych Patients Only)  Admission Status Voluntary  Psychosocial Assessment  Patient Complaints None  Eye Contact Fair  Facial Expression Animated  Affect Labile  Speech Logical/coherent;Pressured  Interaction Assertive  Motor Activity Slow  Appearance/Hygiene In hospital gown;Disheveled  Behavior Characteristics Appropriate to situation;Cooperative  Mood Labile;Pleasant  Aggressive Behavior  Effect No apparent injury  Thought Process  Coherency WDL  Content WDL  Delusions None reported or observed  Perception WDL  Hallucination None reported or observed  Judgment Limited  Confusion None  Danger to Self  Current suicidal ideation? Denies  Description of Suicide Plan No plan indicated.  Self-Injurious Behavior No self-injurious ideation or behavior indicators observed or expressed   Agreement Not to Harm Self Yes  Description of Agreement Verbally contracts for safety.  Danger to Others  Danger to Others None reported or observed  Danger to Others Abnormal  Harmful Behavior to others No threats or harm toward other people  Destructive Behavior No threats or harm toward property   Patient alert and oriented. Presenting with a labile affect and pleasant, labile mood. Patient denies SI, HI, AVH. Patient denies anxiety and depression at this time. Patient states that she is remorseful of becoming upset about insulin during previous shift and states "my goal is to get my diabetes managed". Support and encouragement provided. Patient endorsed pain rated 10/10 in her back and legs, described as "burning". PRN ibuprofen administered per provider orders. Patient provided with heat packs for back and legs. Patient CBG 353 mg/dL, 5 units of novolog 0-5 u sliding scale administered, per provider orders. Quintella Reichert, NP updated on patient CBG. Scheduled medications administered to patient, per provider orders. Support and encouragement provided. Routine  safety checks conducted every 15 minutes. Patient verbally contracts for safety and remains safe on the unit.

## 2022-02-02 LAB — GLUCOSE, CAPILLARY
Glucose-Capillary: 287 mg/dL — ABNORMAL HIGH (ref 70–99)
Glucose-Capillary: 220 mg/dL — ABNORMAL HIGH (ref 70–99)
Glucose-Capillary: 225 mg/dL — ABNORMAL HIGH (ref 70–99)
Glucose-Capillary: 130 mg/dL — ABNORMAL HIGH (ref 70–99)

## 2022-02-02 LAB — RESP PANEL BY RT-PCR (RSV, FLU A&B, COVID)  RVPGX2
Influenza A by PCR: NEGATIVE
Influenza B by PCR: NEGATIVE
Resp Syncytial Virus by PCR: NEGATIVE
SARS Coronavirus 2 by RT PCR: NEGATIVE

## 2022-02-02 MED ORDER — INSULIN ASPART 100 UNIT/ML IJ SOLN
0.0000 [IU] | Freq: Three times a day (TID) | INTRAMUSCULAR | Status: DC
  Administered 2022-02-02 (×2): 5 [IU] via SUBCUTANEOUS
  Administered 2022-02-03: 2 [IU] via SUBCUTANEOUS
  Administered 2022-02-03: 8 [IU] via SUBCUTANEOUS
  Administered 2022-02-03: 5 [IU] via SUBCUTANEOUS
  Administered 2022-02-04: 8 [IU] via SUBCUTANEOUS
  Administered 2022-02-05: 3 [IU] via SUBCUTANEOUS
  Administered 2022-02-05 – 2022-02-06 (×2): 5 [IU] via SUBCUTANEOUS
  Administered 2022-02-06 – 2022-02-07 (×3): 3 [IU] via SUBCUTANEOUS

## 2022-02-02 MED ORDER — INSULIN ASPART 100 UNIT/ML IJ SOLN
5.0000 [IU] | Freq: Three times a day (TID) | INTRAMUSCULAR | Status: DC
  Administered 2022-02-02 – 2022-02-07 (×13): 5 [IU] via SUBCUTANEOUS
  Filled 2022-02-02: qty 10

## 2022-02-02 MED ORDER — INSULIN GLARGINE-YFGN 100 UNIT/ML ~~LOC~~ SOLN
18.0000 [IU] | Freq: Every day | SUBCUTANEOUS | Status: DC
  Administered 2022-02-03 – 2022-02-07 (×5): 18 [IU] via SUBCUTANEOUS
  Filled 2022-02-02: qty 10

## 2022-02-02 MED ORDER — INSULIN ASPART 100 UNIT/ML IJ SOLN: 5.0000 [IU] | Freq: Three times a day (TID) | INTRAMUSCULAR | Status: DC

## 2022-02-02 MED ORDER — DOCUSATE SODIUM 100 MG PO CAPS
100.0000 mg | ORAL_CAPSULE | Freq: Two times a day (BID) | ORAL | Status: DC
  Administered 2022-02-02 – 2022-02-07 (×10): 100 mg via ORAL
  Filled 2022-02-02 (×13): qty 1

## 2022-02-02 NOTE — Progress Notes (Addendum)
[facility]  02/02/2022 11:45 AM Sara PulsSacha D Mcomber  MRN:  [id number]  Principal Problem: MDD (major depressive disorder), recurrent episode, severe (HCC) Diagnosis: Principal Problem:   MDD (major depressive disorder), recurrent episode, severe (HCC)  HPI: Sara [name] is a 47 year old African American female with a past psychiatric diagnoses of schizoaffective disorder, bipolar, and major depressive disorder recurrent severe who [facility] for worsening depression and suicidal thoughts. Patient noted to have multiple medical co-morbidities including diabetes, sickle cell trait, hypertension, hyperlipidemia mixed. On initial assessment patient reported increased stressors related to homelessness, substance abuse (cocaine and marijuana), family issues (recently asked to leave son's home), and increased depression with suicidal ideations where she reports intentionally stopped eating and taking her insulin and eating to induce diabetic coma for suicide. She reports significant weight-loss as a result.   24 hr assessment: Patient visible in milieu participating in unit activities and groups. Had some reports of 'burning pain' in her pack and legs overnight. Received PRN Albuterol 0801, Hydroxyzine 25 mg 2112, Ibuprofen 400 mg 2337; 0600 287. Patient remains on carb modified diet. Nursing staff contacted provider during the evening reporting patient was slow to comply with insulin complaining about headaches; provider and staff have discussed at length the need to maintain bg levels <200 as well as current regimen. Order placed for DM coordinator and education. Colace and Senna added for constipation; Last BM x2 days ago. Reports history of IBS and being on Linzess; none found in the chart. Staff report patient slept overnight.   Assessment: Patient assessed in room where she initially presented laying down. She presented casual appearance, good eye  contact. Alert and oriented. She reports not resting well during the night in which she attributes to physical discomfort related to the bed; requesting to have Gabapentin adjusted. She rates her anxiety 4/10 related to peers on the unit being disruptive and depression as 5/10. Patient reports attending unit groups. Continues to express interest in treatment programs feels 28 day program may be best [facility]). Reports ongoing contact with sons; mostly son Sara [name]. Denies any thoughts of wanting to harm herself or anyone, auditory or visual hallucinations. Continues to contract for safety. During diabetes education patient reports previously being over 200 lbs and now currently 150 lbs due to bad decisions in which she endorses determination to change.   Inpatient Diabetes Program Recommendations:  Diabetes history: DM2 Outpatient Diabetes medications: Lantus 14 units daily, Humalog 1-10 units TID with meals Current orders for Inpatient glycemic control: Semglee 10 units daily, Novolog 0-15 units TID with meals, Novolog 0-5 units QHS   Inpatient Diabetes Program Recommendations:     Insulin: Please consider increasing Semglee 18 units daily to start 02/03/22 (already given Semglee 10 units today; so order Semglee 8 units x1 now) and ordering Novolog 5 units TID with meals for meal coverage if patient eats at least 50% of meals  Total Time spent with patient: 30 minutes   Past Medical History:  Past Medical History:  Diagnosis Date   Acid reflux    Chronic pain    Depression    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 pylori infection    High risk medication use    Hypercholesterolemia    Hyperlipidemia, mixed    Hypertension    Other mixed anxiety disorders  Pollen allergies    Sickle cell trait (HCC)    Sleep apnea    Vitamin D deficiency     Past Surgical History:  Procedure Laterality Date   ABDOMINAL  HYSTERECTOMY     APPENDECTOMY     COLONOSCOPY  08-16-2008   Dr. Vallarie Mare   ESOPHAGOGASTRODUODENOSCOPY  08-16-2008   Dr. Rayfield Citizen    Family History:  Family History  Problem Relation Age of Onset   Clotting disorder Mother    Crohn's disease Mother    Heart disease Mother    Colon cancer Father    Clotting disorder Brother    Diabetes Brother    Diabetes Maternal Aunt    Liver cancer Maternal Uncle    Prostate cancer Maternal Uncle    Colon cancer Maternal Uncle    Diabetes Paternal Aunt    Esophageal cancer Paternal Uncle    Family Psychiatric History: not noted Social History:  Social History   Substance and Sexual Activity  Alcohol Use None     Social History   Substance and Sexual Activity  Drug Use Yes   Types: Marijuana    Social History   Socioeconomic History   Marital status: Married    Spouse name: Not on file   Number of children: 2   Years of education: Not on file   Highest education level: Not on file  Occupational History   Occupation: n/a  Tobacco Use   Smoking status: Every Day    Packs/day: 1.00    Types: Cigarettes   Smokeless tobacco: Never  Substance and Sexual Activity   Alcohol use: Not on file   Drug use: Yes    Types: Marijuana   Sexual activity: Not on file  Other Topics Concern   Not on file  Social History Narrative   Not on file   Social Determinants of Health   Financial Resource Strain: Not on file  Food Insecurity: Food Insecurity Present (01/30/2022)   Hunger Vital Sign    Worried About Running Out of Food in the Last Year: Often true    Ran Out of Food in the Last Year: Often true  Transportation Needs: Unknown (01/30/2022)   PRAPARE - Administrator, Civil Service (Medical): Patient refused    Lack of Transportation (Non-Medical): Patient refused  Physical Activity: Not on file  Stress: Not on file  Social Connections: Not on file   Additional Social History:   Sleep: Fair  Appetite:   Good  Current Medications: Current Facility-Administered Medications  Medication Dose Route Frequency Provider Last Rate Last Admin   albuterol (VENTOLIN HFA) 108 (90 Base) MCG/ACT inhaler 2 puff  2 puff Inhalation Q6H PRN Onuoha, Chinwendu V, NP   2 puff at 02/02/22 0801   alum & mag hydroxide-simeth (MAALOX/MYLANTA) 200-200-20 MG/5ML suspension 30 mL  30 mL Oral Q4H PRN Lenard Lance, FNP       ARIPiprazole (ABILIFY) tablet 15 mg  15 mg Oral QHS Ntuen, Tina C, FNP   15 mg at 02/01/22 2112   docusate sodium (COLACE) capsule 100 mg  100 mg Oral BID Leevy-Johnson, Granger Chui A, NP       FLUoxetine (PROZAC) capsule 20 mg  20 mg Oral Daily Ntuen, Tina C, FNP   20 mg at 02/02/22 0800   gabapentin (NEURONTIN) capsule 400 mg  400 mg Oral TID Cecilie Lowers, FNP   400 mg at 02/02/22 0800   hydrOXYzine (ATARAX) tablet 25 mg  25 mg Oral TID  PRN Lenard Lance, FNP   25 mg at 02/01/22 2112   ibuprofen (ADVIL) tablet 400 mg  400 mg Oral Q6H PRN Cecilie Lowers, FNP   400 mg at 02/02/22 1013   insulin aspart (novoLOG) injection 0-15 Units  0-15 Units Subcutaneous TID WC Leevy-Johnson, Jacelynn Hayton A, NP       insulin aspart (novoLOG) injection 0-5 Units  0-5 Units Subcutaneous QHS Onuoha, Chinwendu V, NP   5 Units at 01/31/22 2150   insulin aspart (novoLOG) injection 5 Units  5 Units Subcutaneous TID WC Leevy-Johnson, Aloys Hupfer A, NP       [START ON 02/03/2022] insulin glargine-yfgn (SEMGLEE) injection 18 Units  18 Units Subcutaneous Daily Leevy-Johnson, Eavan Gonterman A, NP       lisinopril (ZESTRIL) tablet 10 mg  10 mg Oral Daily Ntuen, Tina C, FNP   10 mg at 02/02/22 0800   risperiDONE (RISPERDAL M-TABS) disintegrating tablet 2 mg  2 mg Oral Q8H PRN Ntuen, Jesusita Oka, FNP       And   LORazepam (ATIVAN) tablet 1 mg  1 mg Oral PRN Ntuen, Jesusita Oka, FNP       And   ziprasidone (GEODON) injection 20 mg  20 mg Intramuscular PRN Ntuen, Jesusita Oka, FNP       magnesium hydroxide (MILK OF MAGNESIA) suspension 30 mL  30 mL Oral Daily PRN Lenard Lance, FNP       nicotine (NICODERM CQ - dosed in mg/24 hours) patch 14 mg  14 mg Transdermal Daily Lenard Lance, FNP   14 mg at 02/01/22 0813   ondansetron (ZOFRAN-ODT) disintegrating tablet 4 mg  4 mg Oral Q8H PRN Ntuen, Jesusita Oka, FNP       pantoprazole (PROTONIX) EC tablet 40 mg  40 mg Oral Daily Leevy-Johnson, Neko Mcgeehan A, NP   40 mg at 02/02/22 0800   prazosin (MINIPRESS) capsule 1 mg  1 mg Oral QHS Ntuen, Tina C, FNP   1 mg at 02/01/22 2112   senna (SENOKOT) tablet 8.6 mg  1 tablet Oral Daily PRN Leevy-Johnson, Zair Borawski A, NP   8.6 mg at 02/01/22 2112   traZODone (DESYREL) tablet 100 mg  100 mg Oral QHS PRN Cecilie Lowers, FNP   100 mg at 02/01/22 2112    Lab Results:  Results for orders placed or performed during the hospital encounter of 01/29/22 (from the past 48 hour(s))  Glucose, capillary     Status: Abnormal   Collection Time: 01/31/22 12:06 PM  Result Value Ref Range   Glucose-Capillary 347 (H) 70 - 99 mg/dL    Comment: Glucose reference range applies only to samples taken after fasting for at least 8 hours.  Glucose, capillary     Status: Abnormal   Collection Time: 01/31/22  5:15 PM  Result Value Ref Range   Glucose-Capillary 212 (H) 70 - 99 mg/dL    Comment: Glucose reference range applies only to samples taken after fasting for at least 8 hours.  Glucose, capillary     Status: Abnormal   Collection Time: 01/31/22  9:39 PM  Result Value Ref Range   Glucose-Capillary 353 (H) 70 - 99 mg/dL    Comment: Glucose reference range applies only to samples taken after fasting for at least 8 hours.  Glucose, capillary     Status: Abnormal   Collection Time: 02/01/22  6:14 AM  Result Value Ref Range   Glucose-Capillary 272 (H) 70 - 99 mg/dL    Comment: Glucose reference  range applies only to samples taken after fasting for at least 8 hours.  Comprehensive metabolic panel     Status: Abnormal   Collection Time: 02/01/22  6:48 AM  Result Value Ref Range   Sodium 134 (L) 135 - 145 mmol/L    Potassium 4.2 3.5 - 5.1 mmol/L   Chloride 103 98 - 111 mmol/L   CO2 22 22 - 32 mmol/L   Glucose, Bld 316 (H) 70 - 99 mg/dL    Comment: Glucose reference range applies only to samples taken after fasting for at least 8 hours.   BUN 8 6 - 20 mg/dL   Creatinine, Ser 7.65 0.44 - 1.00 mg/dL   Calcium 9.3 8.9 - 46.5 mg/dL   Total Protein 7.3 6.5 - 8.1 g/dL   Albumin 3.6 3.5 - 5.0 g/dL   AST 13 (L) 15 - 41 U/L   ALT 16 0 - 44 U/L   Alkaline Phosphatase 53 38 - 126 U/L   Total Bilirubin 0.6 0.3 - 1.2 mg/dL   GFR, Estimated >03 >54 mL/min    Comment: (NOTE) Calculated using the CKD-EPI Creatinine Equation (2021)    Anion gap 9 5 - 15    Comment: Performed at [facility], [address] [place], [place] [address]  Lipid panel     Status: Abnormal   Collection Time: 02/01/22  6:48 AM  Result Value Ref Range   Cholesterol 204 (H) 0 - 200 mg/dL   Triglycerides 61 <275 mg/dL   HDL 61 >17 mg/dL   Total CHOL/HDL Ratio 3.3 RATIO   VLDL 12 0 - 40 mg/dL   LDL Cholesterol 001 (H) 0 - 99 mg/dL    Comment:        Total Cholesterol/HDL:CHD Risk Coronary Heart Disease Risk Table                     Men   Women  1/2 Average Risk   3.4   3.3  Average Risk       5.0   4.4  2 X Average Risk   9.6   7.1  3 X Average Risk  23.4   11.0        Use the calculated Patient Ratio above and the CHD Risk Table to determine the patient's CHD Risk.        ATP III CLASSIFICATION (LDL):  <100     mg/dL   Optimal  749-449  mg/dL   Near or Above                    Optimal  130-159  mg/dL   Borderline  675-916  mg/dL   High  >384     mg/dL   Very High Performed at [facility], [address] [place], [place] [address]   Glucose, capillary     Status: Abnormal   Collection Time: 02/01/22 11:41 AM  Result Value Ref Range   Glucose-Capillary 281 (H) 70 - 99 mg/dL    Comment: Glucose reference range applies only to samples taken after fasting for at least 8 hours.   Glucose, capillary     Status: Abnormal   Collection Time: 02/01/22  5:06 PM  Result Value Ref Range   Glucose-Capillary 299 (H) 70 - 99 mg/dL    Comment: Glucose reference range applies only to samples taken after fasting for at least 8 hours.  Glucose, capillary     Status: Abnormal  Collection Time: 02/01/22  8:12 PM  Result Value Ref Range   Glucose-Capillary 262 (H) 70 - 99 mg/dL    Comment: Glucose reference range applies only to samples taken after fasting for at least 8 hours.  Glucose, capillary     Status: Abnormal   Collection Time: 02/02/22  6:12 AM  Result Value Ref Range   Glucose-Capillary 287 (H) 70 - 99 mg/dL    Comment: Glucose reference range applies only to samples taken after fasting for at least 8 hours.    Blood Alcohol level:  Lab Results  Component Value Date   [facility]  12/11/2009    <5        LOWEST DETECTABLE LIMIT FOR SERUM ALCOHOL IS 5 mg/dL FOR MEDICAL PURPOSES ONLY    Metabolic Disorder Labs: No results found for: "HGBA1C", "MPG" No results found for: "PROLACTIN" Lab Results  Component Value Date   CHOL 204 (H) 02/01/2022   TRIG 61 02/01/2022   HDL 61 02/01/2022   CHOLHDL 3.3 02/01/2022   VLDL 12 02/01/2022   LDLCALC 131 (H) 02/01/2022    Physical Findings: AIMS:  , ,  ,  ,    CIWA:    COWS:     Musculoskeletal: Strength & Muscle Tone: within normal limits Gait & Station: normal Patient leans: N/A  Psychiatric Specialty Exam:  Presentation  General Appearance:  Casual; Disheveled  Eye Contact: Good  Speech: Clear and Coherent  Speech Volume: Normal  Handedness: Right  Mood and Affect  Mood: Euthymic  Affect: Congruent; Appropriate  Thought Process  Thought Processes: Coherent; Goal Directed  Descriptions of Associations:Intact  Orientation:Full (Time, Place and Person)  Thought Content:Logical  History of Schizophrenia/Schizoaffective disorder:No data recorded Duration of Psychotic Symptoms:No data  recorded Hallucinations:Hallucinations: None  Ideas of Reference:None  Suicidal Thoughts:Suicidal Thoughts: No  Homicidal Thoughts:Homicidal Thoughts: No  Sensorium  Memory: Immediate Good; Recent Good  Judgment: Good  Insight: Good  Executive Functions  Concentration: Good  Attention Span: Good  Recall: Good  Fund of Knowledge: Good  Language: Good  Psychomotor Activity  Psychomotor Activity: Psychomotor Activity: Normal  Assets  Assets: Communication Skills; Desire for Improvement; Housing; Resilience  Sleep  Sleep: Sleep: Good  Physical Exam: Physical Exam Vitals and nursing note reviewed.  Constitutional:      Appearance: She is normal weight. She is not ill-appearing.  HENT:     Head: Normocephalic.     Nose: Nose normal.     Mouth/Throat:     Mouth: Mucous membranes are moist.     Pharynx: Oropharynx is clear.  Eyes:     Pupils: Pupils are equal, round, and reactive to light.  Cardiovascular:     Rate and Rhythm: Normal rate.     Pulses: Normal pulses.  Pulmonary:     Effort: Pulmonary effort is normal.  Abdominal:     Palpations: Abdomen is soft.  Musculoskeletal:        General: Normal range of motion.     Cervical back: Normal range of motion.  Skin:    General: Skin is warm and dry.  Neurological:     Mental Status: She is alert and oriented to person, place, and time.  Psychiatric:        Attention and Perception: Attention and perception normal. She does not perceive auditory or visual hallucinations.        Mood and Affect: Mood normal. Affect is blunt.        Speech: Speech normal.  Behavior: Behavior is cooperative.        Thought Content: Thought content is not paranoid or delusional. Thought content does not include homicidal or suicidal ideation. Thought content does not include homicidal or suicidal plan.        Cognition and Memory: Cognition and memory normal.        Judgment: Judgment normal.    Review of  Systems  Constitutional:  Positive for weight loss.  Psychiatric/Behavioral:  Positive for substance abuse.   All other systems reviewed and are negative.  Blood pressure 101/68, pulse (!) 131, temperature 97.9 F (36.6 C), temperature source Oral, resp. rate 18, height 5' 4.5" (1.638 m), weight 67.1 kg, SpO2 98 %. Body mass index is 25.01 kg/m.  Treatment Plan Summary: Daily contact with patient to assess and evaluate symptoms and progress in treatment, Medication management, and Plan   PLAN:  Safety and Monitoring: Voluntary admission to inpatient psychiatric unit for safety, stabilization and treatment Daily contact with patient to assess and evaluate symptoms and progress in treatment Patient's case to be discussed in multi-disciplinary team meeting Observation Level : q15 minute checks Vital signs: q12 hours Precautions: suicide, but pt currently verbally contracts for safety on unit    Major depressive disorder: -- Fluoxetine 20 mg capsule p.o. daily   Schizoaffective disorder/bipolar disorder --Abilify 15 mg tablets 1 daily at bedtime   Anxiety --Hydroxyzine 25 mg 3 times daily as needed/anxiety --Gabapentin 300 mg caps 3 times daily daily   Insomnia -Trazodone 100 mg p.o. at bedtime as needed   GERD: -- Omeprazole 40 mg 1 p.o. daily  Constipation:  -- Colace 100 mg BID   Hypertension: -- Lisinopril 10 mg p.o. daily   PTSD: -- Prazosin 1 mg p.o. daily  Diabetes II:  CHANGE: -- Novolog 5 units SQ TID with meals -- Semglee 10 units SQ daily Continue: -- Novolog 0-5 units SQ daily at HS   Other PRN Medications -Acetaminophen 650 mg every 6 hrs as needed/mild pain -Maalox 30 mL oral every 4 hrs as needed/digestion -Magnesium hydroxide 30 mL daily as needed/mild constipation --Zofran disintegrating tabs 4 mg 1 every 6 hours as needed for nausea   Discharge Planning: Social work and case management to assist with discharge planning and identification of  hospital follow-up needs prior to discharge Estimated LOS: 5-7 days Discharge Concerns: Need to establish a safety plan; Medication compliance and effectiveness Discharge Goals: Return home with outpatient referrals for mental health follow-up including medication management/psychotherapy.  Loletta Parish, NP 02/02/2022, 11:45 AM Patient ID: Sara [name], female   DOB: Jun 14, 1975, 47 y.o.   MRN: [id number]

## 2022-02-02 NOTE — Progress Notes (Signed)
D. Pt has been appropriate on the unit- friendly during interactions- rated her depression,hopelessness and anxiety a 5/5/6, respectively. Pt currently denies SI/HI and AVH and does not appear to be responding to internal stimuli.  A. Labs and vitals monitored. Pt given and educated on medications. Pt supported emotionally and encouraged to express concerns and ask questions.   R. Pt remains safe with 15 minute checks. Will continue POC.

## 2022-02-02 NOTE — [facility] Group Notes (Signed)
Adult Psychoeducational Group Note  Date:  02/02/2022 Time:  9:06 AM  Group Topic/Focus:  Goals Group:   The focus of this group is to help patients establish daily goals to achieve during treatment and discuss how the patient can incorporate goal setting into their daily lives to aide in recovery.  Participation Level:  Active  Participation Quality:  Appropriate  Affect:  Appropriate  Cognitive:  Appropriate  Insight: Appropriate  Engagement in Group:  Engaged  Modes of Intervention:  Discussion  Additional Comments:  Patient goal of the day is easing the pain.  Sara [name] 02/02/2022, 9:06 AM

## 2022-02-02 NOTE — [facility] Group Notes (Signed)
Adult Psychoeducational Group Note Date:  02/02/2022 Time:  0900-1000 Group Topic/Focus: PROGRESSIVE RELAXATION. A group where deep breathing is taught and tensing and relaxation muscle groups is used. Imagery is used as well.  Pts are asked to imagine 3 pillars that hold them up when they are not able to hold themselves up and to share that with the group.   Participation Level: did not attend   : Sara [name] A   

## 2022-02-02 NOTE — Inpatient Diabetes Management (Signed)
Inpatient Diabetes Program Recommendations  AACE/ADA: New Consensus Statement on Inpatient Glycemic Control  Target Ranges:  Prepandial:   less than 140 mg/dL      Peak postprandial:   less than 180 mg/dL (1-2 hours)      Critically ill patients:  140 - 180 mg/dL    Latest Reference Range & Units 02/01/22 06:14 02/01/22 11:41 02/01/22 17:06 02/01/22 20:12 02/02/22 06:12  Glucose-Capillary 70 - 99 mg/dL 272 (H) 281 (H) 299 (H) 262 (H) 287 (H)   Review of Glycemic Control  Diabetes history: DM2 Outpatient Diabetes medications: Lantus 14 units daily, Humalog 1-10 units TID with meals Current orders for Inpatient glycemic control: Semglee 10 units daily, Novolog 0-15 units TID with meals, Novolog 0-5 units QHS  Inpatient Diabetes Program Recommendations:    Insulin: Please consider increasing Semglee 18 units daily to start 02/03/22 (already given Semglee 10 units today; so order Semglee 8 units x1 now) and ordering Novolog 5 units TID with meals for meal coverage if patient eats at least 50% of meals.  Thanks, Barnie Alderman, RN, MSN, [place] [facility] Diabetes Coordinator Inpatient Diabetes Program [phone] (Team Pager from 8am to [place])

## 2022-02-03 LAB — GLUCOSE, CAPILLARY
Glucose-Capillary: 212 mg/dL — ABNORMAL HIGH (ref 70–99)
Glucose-Capillary: 249 mg/dL — ABNORMAL HIGH (ref 70–99)
Glucose-Capillary: 256 mg/dL — ABNORMAL HIGH (ref 70–99)
Glucose-Capillary: 116 mg/dL — ABNORMAL HIGH (ref 70–99)

## 2022-02-03 LAB — HEMOGLOBIN A1C
Hgb A1c MFr Bld: 12.7 % — ABNORMAL HIGH (ref 4.8–5.6)
Mean Plasma Glucose: 318 mg/dL

## 2022-02-03 MED ORDER — NICOTINE POLACRILEX 2 MG MT GUM
2.0000 mg | CHEWING_GUM | OROMUCOSAL | Status: DC | PRN
  Administered 2022-02-04: 2 mg via ORAL

## 2022-02-03 MED ORDER — FLEET ENEMA 7-19 GM/118ML RE ENEM
1.0000 | ENEMA | Freq: Once | RECTAL | Status: AC
  Administered 2022-02-03: 1 via RECTAL
  Filled 2022-02-03 (×2): qty 1

## 2022-02-03 NOTE — Progress Notes (Signed)
Pt refused her prazosin before bed stating that it make her have nightmares.

## 2022-02-03 NOTE — Progress Notes (Signed)
   02/02/22 2100  Psych Admission Type (Psych Patients Only)  Admission Status Voluntary  Psychosocial Assessment  Patient Complaints Anxiety  Eye Contact Fair  Facial Expression Anxious  Affect Appropriate to circumstance  Speech Logical/coherent  Interaction Assertive  Motor Activity Slow  Appearance/Hygiene Unremarkable  Behavior Characteristics Cooperative;Appropriate to situation  Mood Pleasant  Thought Process  Coherency WDL  Content WDL  Delusions None reported or observed  Perception WDL  Hallucination None reported or observed  Judgment Poor  Confusion None  Danger to Self  Current suicidal ideation? Denies  Self-Injurious Behavior No self-injurious ideation or behavior indicators observed or expressed   Agreement Not to Harm Self Yes  Description of Agreement verbal  Danger to Others  Danger to Others None reported or observed  Danger to Others Abnormal  Harmful Behavior to others No threats or harm toward other people

## 2022-02-03 NOTE — Group Note (Signed)
Recreation Therapy Group Note   Group Topic:Stress Management  Group Date: 02/03/2022 Start Time: 0935 End Time: 0955 Facilitators: Aprile Dickenson-McCall, LRT,CTRS Location: [address] Dayroom   Goal Area(s) Addresses:  Patient will identify positive stress management techniques. Patient will identify benefits of using stress management post d/c.  Group Description:  Meditation.  LRT played a meditation for the patients that focused on having a fresh new start to your day.  Patients were to listen and follow along as meditation played to fully encompass what the meditation is saying.   Affect/Mood: N/A   Participation Level: Did not attend    Clinical Observations/Individualized Feedback:     Plan: Continue to engage patient in RT group sessions 2-3x/week.   Sheranda Seabrooks-McCall, LRT,CTRS  02/03/2022 12:51 PM

## 2022-02-03 NOTE — Inpatient Diabetes Management (Signed)
Inpatient Diabetes Program Recommendations  AACE/ADA: New Consensus Statement on Inpatient Glycemic Control (2015)  Target Ranges:  Prepandial:   less than 140 mg/dL      Peak postprandial:   less than 180 mg/dL (1-2 hours)      Critically ill patients:  140 - 180 mg/dL   Lab Results  Component Value Date   GLUCAP 212 (H) 02/03/2022    Review of Glycemic Control  Diabetes history: DM 2 Outpatient Diabetes medications: Lantus 14 units Daily, Humalog 1-10 units tid Current orders for Inpatient glycemic control:  Semglee 18 units Daily Novolog 0-15 units tid + hs Novolog 5 units tid meal coverage  Inpatient Diabetes Program Recommendations:     Watch glucose trends today with increased dose of Semglee. Pt is homeless and her social situation will be difficult to manage diabetes. Unsure of consistency of meal intake at home. Will touch base with pt tomorrow 1/9.   Thanks,  Tama Headings RN, MSN, BC-ADM Inpatient Diabetes Coordinator Team Pager [phone] (8a-5p)

## 2022-02-03 NOTE — Progress Notes (Signed)
D:  Patient's self inventory sheet, patient has poor sleep, sleep medication not given.  Fair appetite, low energy level, poor concentration.  Rated depression 5, hopeless 7, anxiety 3.  Withdrawals, tremors, chilling, cravings, irritability.  Denied SI.  Physical problems, pain, dizzy, blurred vision.  # 8 legs, back pain, worst pain in past 24 hours.  Goal and plans to rest today.  No discharge plans.  Needs a place to stay. A:  Medications administered per MD orders.  Emotional support and encouragement given patient. R:  Denied SI and HI, contracts for safety.  Denied A/V hallucinations.  Safety maintained with 15 minute checks.

## 2022-02-03 NOTE — [facility] Group Notes (Signed)
Pt did not attend AA group  

## 2022-02-03 NOTE — Plan of Care (Signed)
Nurse discussed anxiety, depression and coping skills with patient.  

## 2022-02-03 NOTE — Progress Notes (Signed)
Patient's food intake on 02/03/2022  Morning:  60% of breakfast tray, 1.5 cups water. 2 cups ginger ale, one coffee, one apple juice  Lunch:  One salad, 1/2 hamburger, one piece bread  Afternoon:  potato chips bag, pumpkin pie, vanilla ice cream, cranberry juice, glass water, ginger ale  Dinner:  Fish, carrots, peas, hush puppies one and half plates, plus one ginger ale.

## 2022-02-03 NOTE — Progress Notes (Signed)
Pt woke up at 0145 complaining of constipation stating that she had not moved her bowel for the past 4 days. Pt also stated that she has taken colace, senna, milk of magnesia, citrate magnesium in the past but has not worked for her and demanded to taken to the ED. Provider on call notified and ordrerd sodium phosphate (FLEET) enema. Pt took it without problems and tolerated. Pt's moved her bowel a few minutes later and verbalized relief, will continue to monitor.

## 2022-02-03 NOTE — Progress Notes (Addendum)
[facility] MD Progress Note  02/03/2022 1:46 PM Sara [name]  MRN:  [id number]  Principal Problem: MDD (major depressive disorder), recurrent episode, severe (HCC) Diagnosis: Principal Problem:   MDD (major depressive disorder), recurrent episode, severe (HCC)  HPI: Sara [name] is a 47 year old African American female with a past psychiatric diagnoses of schizoaffective disorder, bipolar, and major depressive disorder recurrent severe who presented voluntarily to [facility] 01/29/22 from [facility] [place] ED for worsening depression and suicidal thoughts. Patient noted to have multiple medical co-morbidities including diabetes, sickle cell trait, hypertension, hyperlipidemia mixed. On initial assessment patient reported increased stressors related to homelessness, substance abuse (cocaine and marijuana), family issues (recently asked to leave son's home), and increased depression with suicidal ideations where she reports intentionally stopped eating and taking her insulin and eating to induce diabetic coma for suicide. She reports significant weight-loss as a result.   24 hr assessment:  Received PRNs' Albuterol last night and this morning for wheezing and shortness of breath, hydroxyzine for anxiety this morning, ibuprofen last night and twice this morning. Patient remains on carb modified diet for her diabetes. Order placed for DM coordinator and education.  She reports having a Fleet enema last night and had a good BM. Reports history of IBS and being on Linzess although none found in the chart.  Patient reports sleeping over 1 hour last night.  Strongly encourage patient to call for as needed medication for sleep from the nursing staff to improve sleep quality.  Assessment: Patient seen and examined in her room lying in bed.  Her hygiene and appearance is fairly groomed. Patient is less visible in unit milieu and in unit activities and groups. As per recreation therapy group notes, patient did not attend unit  groups.  Encouraged to participate in unit activities and therapeutic milieu as it helps with healing process. Alert and oriented x 4. She reports not resting well during the night in which she attributes to physical discomfort related to nurses obtaining her blood glucose at night.  She rates her anxiety 3/10 related to her hopelessness.  Reports mood is less depressed today.  Continues to express interest in treatment programs, she communicates that 28- day program may be best for her.  Denies SI, HI, or AVH. Continues to contract for safety.  No paranoia or delusional thinking observed during assessment.  She did not during diabetes education patient reports being concerned over her blood glucose remaining over 200s.  Supportive therapy provided on ongoing stressors, and encouraged patient to stick to her low carbohydrate diet and insulin therapy.  Inpatient Diabetes Program Recommendations:  Diabetes history: DM2 Outpatient Diabetes medications: Lantus 14 units daily, Humalog 1-10 units TID with meals Current orders for Inpatient glycemic control: Semglee 10 units daily, Novolog 0-15 units TID with meals, Novolog 0-5 units QHS   Inpatient Diabetes Program Recommendations:     Insulin: Please consider increasing Semglee 18 units daily to start 02/03/22 (already given Semglee 10 units today; so order Semglee 8 units x1 now) and ordering Novolog 5 units TID with meals for meal coverage if patient eats at least 50% of meals  Total Time spent with patient: 30 minutes   Past Medical History:  Past Medical History:  Diagnosis Date   Acid reflux    Chronic pain    Depression    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  pylori infection    High risk medication use    Hypercholesterolemia    Hyperlipidemia, mixed    Hypertension    Other mixed anxiety disorders    Pollen allergies    Sickle cell trait ([place])     Sleep apnea    Vitamin D deficiency     Past Surgical History:  Procedure Laterality Date   ABDOMINAL HYSTERECTOMY     APPENDECTOMY     COLONOSCOPY  08-16-2008   Dr. Ladean Raya   ESOPHAGOGASTRODUODENOSCOPY  08-16-2008   Dr. Orlena Sheldon    Family History:  Family History  Problem Relation Age of Onset   Clotting disorder Mother    Crohn's disease Mother    Heart disease Mother    Colon cancer Father    Clotting disorder Brother    Diabetes Brother    Diabetes Maternal Aunt    Liver cancer Maternal Uncle    Prostate cancer Maternal Uncle    Colon cancer Maternal Uncle    Diabetes Paternal Aunt    Esophageal cancer Paternal Uncle    Family Psychiatric History: not noted Social History:  Social History   Substance and Sexual Activity  Alcohol Use None     Social History   Substance and Sexual Activity  Drug Use Yes   Types: Marijuana    Social History   Socioeconomic History   Marital status: Married    Spouse name: Not on file   Number of children: 2   Years of education: Not on file   Highest education level: Not on file  Occupational History   Occupation: n/a  Tobacco Use   Smoking status: Every Day    Packs/day: 1.00    Types: Cigarettes   Smokeless tobacco: Never  Substance and Sexual Activity   Alcohol use: Not on file   Drug use: Yes    Types: Marijuana   Sexual activity: Not on file  Other Topics Concern   Not on file  Social History Narrative   Not on file   Social Determinants of Health   Financial Resource Strain: Not on file  Food Insecurity: Food Insecurity Present (01/30/2022)   Hunger Vital Sign    Worried About Running Out of Food in the Last Year: Often true    Ran Out of Food in the Last Year: Often true  Transportation Needs: Unknown (01/30/2022)   PRAPARE - Hydrologist (Medical): Patient refused    Lack of Transportation (Non-Medical): Patient refused  Physical Activity: Not on file  Stress: Not on  file  Social Connections: Not on file   Additional Social History:   Sleep: Fair  Appetite:  Good  Current Medications: Current Facility-Administered Medications  Medication Dose Route Frequency Provider Last Rate Last Admin   albuterol (VENTOLIN HFA) 108 (90 Base) MCG/ACT inhaler 2 puff  2 puff Inhalation Q6H PRN Onuoha, Chinwendu V, NP   2 puff at 02/03/22 0828   alum & mag hydroxide-simeth (MAALOX/MYLANTA) 200-200-20 MG/5ML suspension 30 mL  30 mL Oral Q4H PRN Lucky Rathke, FNP       ARIPiprazole (ABILIFY) tablet 15 mg  15 mg Oral QHS Meila Berke C, FNP   15 mg at 02/02/22 2032   docusate sodium (COLACE) capsule 100 mg  100 mg Oral BID Leevy-Johnson, Brooke A, NP   100 mg at 02/03/22 0829   FLUoxetine (PROZAC) capsule 20 mg  20 mg Oral Daily Mikylah Ackroyd, Kris Hartmann, FNP   20 mg at 02/03/22 [phone]  gabapentin (NEURONTIN) capsule 400 mg  400 mg Oral TID Cecilie Lowers, FNP   400 mg at 02/03/22 1126   hydrOXYzine (ATARAX) tablet 25 mg  25 mg Oral TID PRN Lenard Lance, FNP   25 mg at 02/03/22 1127   ibuprofen (ADVIL) tablet 400 mg  400 mg Oral Q6H PRN Cecilie Lowers, FNP   400 mg at 02/03/22 0839   insulin aspart (novoLOG) injection 0-15 Units  0-15 Units Subcutaneous TID WC Leevy-Johnson, Brooke A, NP   5 Units at 02/03/22 1204   insulin aspart (novoLOG) injection 0-5 Units  0-5 Units Subcutaneous QHS Onuoha, Chinwendu V, NP   5 Units at 01/31/22 2150   insulin aspart (novoLOG) injection 5 Units  5 Units Subcutaneous TID WC Leevy-Johnson, Brooke A, NP   5 Units at 02/03/22 1205   insulin glargine-yfgn (SEMGLEE) injection 18 Units  18 Units Subcutaneous Daily Leevy-Johnson, Brooke A, NP   18 Units at 02/03/22 0836   lisinopril (ZESTRIL) tablet 10 mg  10 mg Oral Daily Quandra Fedorchak, Jesusita Oka, FNP   10 mg at 02/03/22 7893   risperiDONE (RISPERDAL M-TABS) disintegrating tablet 2 mg  2 mg Oral Q8H PRN Damyan Corne, Jesusita Oka, FNP       And   LORazepam (ATIVAN) tablet 1 mg  1 mg Oral PRN Rocklin Soderquist, Jesusita Oka, FNP       And    ziprasidone (GEODON) injection 20 mg  20 mg Intramuscular PRN Leverne Amrhein, Jesusita Oka, FNP       magnesium hydroxide (MILK OF MAGNESIA) suspension 30 mL  30 mL Oral Daily PRN Lenard Lance, FNP       nicotine (NICODERM CQ - dosed in mg/24 hours) patch 14 mg  14 mg Transdermal Daily Lenard Lance, FNP   14 mg at 02/03/22 0700   ondansetron (ZOFRAN-ODT) disintegrating tablet 4 mg  4 mg Oral Q8H PRN Tailyn Hantz, Jesusita Oka, FNP       pantoprazole (PROTONIX) EC tablet 40 mg  40 mg Oral Daily Leevy-Johnson, Brooke A, NP   40 mg at 02/03/22 0831   prazosin (MINIPRESS) capsule 1 mg  1 mg Oral QHS Timothey Dahlstrom C, FNP   1 mg at 02/01/22 2112   senna (SENOKOT) tablet 8.6 mg  1 tablet Oral Daily PRN Leevy-Johnson, Brooke A, NP   8.6 mg at 02/01/22 2112   traZODone (DESYREL) tablet 100 mg  100 mg Oral QHS PRN Cecilie Lowers, FNP   100 mg at 02/01/22 2112    Lab Results:  Results for orders placed or performed during the hospital encounter of 01/29/22 (from the past 48 hour(s))  Glucose, capillary     Status: Abnormal   Collection Time: 02/01/22  5:06 PM  Result Value Ref Range   Glucose-Capillary 299 (H) 70 - 99 mg/dL    Comment: Glucose reference range applies only to samples taken after fasting for at least 8 hours.  Glucose, capillary     Status: Abnormal   Collection Time: 02/01/22  8:12 PM  Result Value Ref Range   Glucose-Capillary 262 (H) 70 - 99 mg/dL    Comment: Glucose reference range applies only to samples taken after fasting for at least 8 hours.  Glucose, capillary     Status: Abnormal   Collection Time: 02/02/22  6:12 AM  Result Value Ref Range   Glucose-Capillary 287 (H) 70 - 99 mg/dL    Comment: Glucose reference range applies only to samples taken after fasting for at  least 8 hours.  Resp panel by RT-PCR (RSV, Flu A&B, Covid) Anterior Nasal Swab     Status: None   Collection Time: 02/02/22 10:15 AM   Specimen: Anterior Nasal Swab  Result Value Ref Range   SARS Coronavirus 2 by RT PCR NEGATIVE NEGATIVE     Comment: (NOTE) SARS-CoV-2 target nucleic acids are NOT DETECTED.  The SARS-CoV-2 RNA is generally detectable in upper respiratory specimens during the acute phase of infection. The lowest concentration of SARS-CoV-2 viral copies this assay can detect is 138 copies/mL. A negative result does not preclude SARS-Cov-2 infection and should not be used as the sole basis for treatment or other patient management decisions. A negative result may occur with  improper specimen collection/handling, submission of specimen other than nasopharyngeal swab, presence of viral mutation(s) within the areas targeted by this assay, and inadequate number of viral copies(<138 copies/mL). A negative result must be combined with clinical observations, patient history, and epidemiological information. The expected result is Negative.  Fact Sheet for Patients:  BloggerCourse.comhttps://www.fda.gov/media/152166/download  Fact Sheet for Healthcare Providers:  SeriousBroker.ithttps://www.fda.gov/media/152162/download  This test is no t yet approved or cleared by the [place] FDA and  has been authorized for detection and/or diagnosis of SARS-CoV-2 by FDA under an Emergency Use Authorization (EUA). This EUA will remain  in effect (meaning this test can be used) for the duration of the COVID-19 declaration under Section 564(b)(1) of the Act, 21 U.S.C.section 360bbb-3(b)(1), unless the authorization is terminated  or revoked sooner.       Influenza A by PCR NEGATIVE NEGATIVE   Influenza B by PCR NEGATIVE NEGATIVE    Comment: (NOTE) The Xpert Xpress SARS-CoV-2/FLU/RSV plus assay is intended as an aid in the diagnosis of influenza from Nasopharyngeal swab specimens and should not be used as a sole basis for treatment. Nasal washings and aspirates are unacceptable for Xpert Xpress SARS-CoV-2/FLU/RSV testing.  Fact Sheet for Patients: BloggerCourse.comhttps://www.fda.gov/media/152166/download  Fact Sheet for Healthcare  Providers: SeriousBroker.ithttps://www.fda.gov/media/152162/download  This test is not yet approved or cleared by the [place] FDA and has been authorized for detection and/or diagnosis of SARS-CoV-2 by FDA under an Emergency Use Authorization (EUA). This EUA will remain in effect (meaning this test can be used) for the duration of the COVID-19 declaration under Section 564(b)(1) of the Act, 21 U.S.C. section 360bbb-3(b)(1), unless the authorization is terminated or revoked.     Resp Syncytial Virus by PCR NEGATIVE NEGATIVE    Comment: (NOTE) Fact Sheet for Patients: BloggerCourse.comhttps://www.fda.gov/media/152166/download  Fact Sheet for Healthcare Providers: SeriousBroker.ithttps://www.fda.gov/media/152162/download  This test is not yet approved or cleared by the [place] FDA and has been authorized for detection and/or diagnosis of SARS-CoV-2 by FDA under an Emergency Use Authorization (EUA). This EUA will remain in effect (meaning this test can be used) for the duration of the COVID-19 declaration under Section 564(b)(1) of the Act, 21 U.S.C. section 360bbb-3(b)(1), unless the authorization is terminated or revoked.  Performed at [facility], [address] [place], [place] [address]   Glucose, capillary     Status: Abnormal   Collection Time: 02/02/22 12:01 PM  Result Value Ref Range   Glucose-Capillary 220 (H) 70 - 99 mg/dL    Comment: Glucose reference range applies only to samples taken after fasting for at least 8 hours.  Glucose, capillary     Status: Abnormal   Collection Time: 02/02/22  5:09 PM  Result Value Ref Range   Glucose-Capillary 225 (H) 70 - 99 mg/dL  Comment: Glucose reference range applies only to samples taken after fasting for at least 8 hours.  Glucose, capillary     Status: Abnormal   Collection Time: 02/02/22  8:27 PM  Result Value Ref Range   Glucose-Capillary 130 (H) 70 - 99 mg/dL    Comment: Glucose reference range applies only to samples taken after  fasting for at least 8 hours.  Glucose, capillary     Status: Abnormal   Collection Time: 02/03/22  6:16 AM  Result Value Ref Range   Glucose-Capillary 212 (H) 70 - 99 mg/dL    Comment: Glucose reference range applies only to samples taken after fasting for at least 8 hours.  Glucose, capillary     Status: Abnormal   Collection Time: 02/03/22 11:26 AM  Result Value Ref Range   Glucose-Capillary 249 (H) 70 - 99 mg/dL    Comment: Glucose reference range applies only to samples taken after fasting for at least 8 hours.    Blood Alcohol level:  Lab Results  Component Value Date   [facility]  12/11/2009    <5        LOWEST DETECTABLE LIMIT FOR SERUM ALCOHOL IS 5 mg/dL FOR MEDICAL PURPOSES ONLY    Metabolic Disorder Labs: Lab Results  Component Value Date   HGBA1C 12.7 (H) 02/01/2022   MPG 318 02/01/2022   No results found for: "PROLACTIN" Lab Results  Component Value Date   CHOL 204 (H) 02/01/2022   TRIG 61 02/01/2022   HDL 61 02/01/2022   CHOLHDL 3.3 02/01/2022   VLDL 12 02/01/2022   LDLCALC 131 (H) 02/01/2022    Physical Findings: AIMS:  , ,  ,  ,    CIWA:    COWS:     Musculoskeletal: Strength & Muscle Tone: within normal limits Gait & Station: normal Patient leans: N/A  Psychiatric Specialty Exam:  Presentation  General Appearance:  Appropriate for Environment; Casual; Fairly Groomed  Eye Contact: Good  Speech: Clear and Coherent; Normal Rate  Speech Volume: Normal  Handedness: Right  Mood and Affect  Mood: Euthymic  Affect: Appropriate; Congruent  Thought Process  Thought Processes: Coherent  Descriptions of Associations:Intact  Orientation:Full (Time, Place and Person)  Thought Content:Logical  History of Schizophrenia/Schizoaffective disorder:No data recorded Duration of Psychotic Symptoms:No data recorded Hallucinations:Hallucinations: None  Ideas of Reference:None  Suicidal Thoughts:Suicidal Thoughts: No SI Passive Intent and/or  Plan: -- (n/a)  Homicidal Thoughts:Homicidal Thoughts: No  Sensorium  Memory: Immediate Good; Recent Good  Judgment: Fair  Insight: Fair  Art therapist  Concentration: Good  Attention Span: Good  Recall: Good  Fund of Knowledge: Fair  Language: Good  Psychomotor Activity  Psychomotor Activity: Psychomotor Activity: Normal  Assets  Assets: Communication Skills; Desire for Improvement; Resilience; Physical Health  Sleep  Sleep: Sleep: Poor Number of Hours of Sleep: 1  Physical Exam: Physical Exam Vitals and nursing note reviewed.  Constitutional:      Appearance: She is normal weight.  HENT:     Head: Normocephalic.     Nose: Nose normal.     Mouth/Throat:     Mouth: Mucous membranes are moist.     Pharynx: Oropharynx is clear.  Eyes:     Conjunctiva/sclera: Conjunctivae normal.     Pupils: Pupils are equal, round, and reactive to light.  Cardiovascular:     Rate and Rhythm: Tachycardia present.     Comments: Blood pressure 109/77, pulse 124.  Nursing staff to recheck vital signs. Pulmonary:  Effort: Pulmonary effort is normal.  Abdominal:     Palpations: Abdomen is soft.  Genitourinary:    Comments: Deferred Musculoskeletal:        General: Normal range of motion.     Cervical back: Normal range of motion.  Skin:    General: Skin is warm and dry.  Neurological:     Mental Status: She is alert and oriented to person, place, and time.  Psychiatric:        Attention and Perception: Attention and perception normal. She does not perceive auditory or visual hallucinations.        Mood and Affect: Mood normal. Affect is blunt.        Speech: Speech normal.        Behavior: Behavior normal. Behavior is cooperative.        Thought Content: Thought content is not paranoid or delusional. Thought content does not include homicidal or suicidal ideation. Thought content does not include homicidal or suicidal plan.        Cognition and Memory:  Cognition and memory normal.        Judgment: Judgment normal.    Review of Systems  Constitutional:  Positive for weight loss.  HENT: Negative.    Respiratory: Negative.    Cardiovascular: Negative.        Blood pressure 109/77, pulse 124.  Nursing staff to recheck vital signs.   Genitourinary: Negative.   Skin: Negative.        Tattoos to bilateral arms and chest  Neurological: Negative.   Endo/Heme/Allergies: Negative.   Psychiatric/Behavioral:  Positive for depression and substance abuse.   All other systems reviewed and are negative.  Blood pressure 109/77, pulse (!) 124, temperature 98 F (36.7 C), temperature source Oral, resp. rate 18, height 5' 4.5" (1.638 m), weight 67.1 kg, SpO2 100 %. Body mass index is 25.01 kg/m.  Treatment Plan Summary: Daily contact with patient to assess and evaluate symptoms and progress in treatment, Medication management, and Plan   PLAN:  Safety and Monitoring: Voluntary admission to inpatient psychiatric unit for safety, stabilization and treatment Daily contact with patient to assess and evaluate symptoms and progress in treatment Patient's case to be discussed in multi-disciplinary team meeting Observation Level : q15 minute checks Vital signs: q12 hours Precautions: suicide, but pt currently verbally contracts for safety on unit    Major depressive disorder: -- Fluoxetine 20 mg capsule p.o. daily   Schizoaffective disorder/bipolar disorder --Abilify 15 mg tablets 1 daily at bedtime   Anxiety --Hydroxyzine 25 mg 3 times daily as needed/anxiety --Gabapentin 300 mg caps 3 times daily daily   Insomnia -Trazodone 100 mg p.o. at bedtime as needed   GERD: -- Omeprazole 40 mg 1 p.o. daily  Constipation:  -- Colace 100 mg BID   Hypertension: -- Lisinopril 10 mg p.o. daily   PTSD: -- Prazosin 1 mg p.o. daily  Diabetes Type 2:  CHANGE: -- Novolog 5 units SQ TID with meals -- Semglee 18 units SQ daily Continue: -- Novolog  0-15 units SQ TID with meals. --Novolog 0-5 units SQ daily at bedtime  Other PRN Medications -Acetaminophen 650 mg every 6 hrs as needed/mild pain -Maalox 30 mL oral every 4 hrs as needed/digestion -Magnesium hydroxide 30 mL daily as needed/mild constipation --Zofran disintegrating tabs 4 mg 1 every 6 hours as needed for nausea   Discharge Planning: Social work and case management to assist with discharge planning and identification of hospital follow-up needs prior to discharge Estimated  LOS: 5-7 days Discharge Concerns: Need to establish a safety plan; Medication compliance and effectiveness Discharge Goals: Return home with outpatient referrals for mental health follow-up including medication management/psychotherapy.  Cecilie Lowersina C Pape Parson, FNP 02/03/2022, 1:46 PM Patient ID: Sara [name], female   DOB: 10/30/1975, 47 y.o.   MRN: [id number] Patient ID: Sara [name], female   DOB: 05/21/1975, 47 y.o.   MRN: [id number]

## 2022-02-04 LAB — GLUCOSE, CAPILLARY
Glucose-Capillary: 201 mg/dL — ABNORMAL HIGH (ref 70–99)
Glucose-Capillary: 255 mg/dL — ABNORMAL HIGH (ref 70–99)

## 2022-02-04 NOTE — Group Note (Signed)
Date:  02/04/2022 Time:  10:22 AM  Group Topic/Focus:  Goals Group:   The focus of this group is to help patients establish daily goals to achieve during treatment and discuss how the patient can incorporate goal setting into their daily lives to aide in recovery.    Participation Level:  Did Not Attend  Participation Quality:      Affect:      Cognitive:      Insight: None  Engagement in Group:      Modes of Intervention:      Additional Comments:     Jerrye Beavers 02/04/2022, 10:22 AM

## 2022-02-04 NOTE — [facility] Group Notes (Signed)
Spiritual care group on grief and loss facilitated by Chaplain Janne Napoleon, [place], Chaplain Genesis Adams and Lysle Morales, [place] intern.    Group Goal:  Support / Education around grief and loss  Members engage in facilitated group support and psycho-social education.  Group Description:  Following introductions and group rules, group members engaged in facilitated group dialog and support around topic of loss, with particular support around experiences of loss in their lives. Group Identified types of loss (relationships / self / things) and identified patterns, circumstances, and changes that precipitate losses. Reflected on thoughts / feelings around loss, normalized grief responses, and recognized variety in grief experience.  Group drew on Adlerian / Rogerian, narrative, MI,  Patient Progress: Sara [name] came into group towards the very end.  She demonstrated engagement even as group was being wrapped up. She had an individual check in with counseling intern, Colleen Pesci.  [address], [place] Pager, [phone]

## 2022-02-04 NOTE — Plan of Care (Signed)
  Problem: Safety: Goal: Periods of time without injury will increase Outcome: Progressing   Problem: Self-Concept: Goal: Level of anxiety will decrease Outcome: Progressing

## 2022-02-04 NOTE — Progress Notes (Signed)
The patient rated her day as a 5 out of 10 since her blood pressure was low and her blood sugar was high. The patient states that she is unclear about her discharge plans.

## 2022-02-04 NOTE — Progress Notes (Signed)
Pt also refused to take meal coverage insulin of 5 units if pt consumes more than 50 percent of meal which she did.

## 2022-02-04 NOTE — Progress Notes (Signed)
Patient refused CBG and insulin at dinner time.

## 2022-02-04 NOTE — Progress Notes (Signed)
[facility] MD Progress Note  02/04/2022 11:39 AM Sara [name]  MRN:  [id number]  Principal Problem: MDD (major depressive disorder), recurrent episode, severe (HCC) Diagnosis: Principal Problem:   MDD (major depressive disorder), recurrent episode, severe (HCC)  HPI: Sara [name] is a 47 year old African American female with a past psychiatric diagnoses of schizoaffective disorder, bipolar, and major depressive disorder recurrent severe who presented voluntarily to [facility] 01/29/22 from [facility] ED for worsening depression and suicidal thoughts. Patient noted to have multiple medical co-morbidities including diabetes, sickle cell trait, hypertension, hyperlipidemia mixed. On initial assessment patient reported increased stressors related to homelessness, substance abuse (cocaine and marijuana), family issues (recently asked to leave son's home), and increased depression with suicidal ideations where she reports intentionally stopped eating and taking her insulin and eating to induce diabetic coma for suicide. She reports significant weight-loss as a result.   24 hr assessment:  Received PRNs' Albuterol last night and this morning for wheezing and shortness of breath, hydroxyzine for anxiety this morning, ibuprofen last night and twice this morning. Patient remains on carb modified diet for her diabetes. Order placed for DM coordinator and education.  She reports having a Fleet enema last night and had a good BM. Reports history of IBS and being on Linzess although none found in the chart.  Patient reports sleeping over 1 hour last night.  Strongly encourage patient to call for as needed medication for sleep from the nursing staff to improve sleep quality.  Assessment: Patient seen and examined in her room lying in bed.  Her hygiene and appearance is fairly groomed. Patient is visible in unit, observed at the medication window receiving her meds from the nursing staff.  As per focused group notes, patient  did not attend unit groups.  Encouraged to participate in unit activities and therapeutic milieu as it helps with healing process. Alert and oriented x 4. She reports resting well during the night which is great improvement from previous night.  She rates her anxiety 6/10 which she attributes to not knowing where to go after discharge.  Made patient aware that she may be going to [facility] possibly on discharge on Thursday or Friday this week.  Reports mood is less depressed today. Denies SI, HI, or AVH. Continues to contract for safety.  No paranoia or delusional thinking observed during assessment.  CBG 201.  Supportive therapy provided on ongoing stressors, and encouraged patient to stick to her low carbohydrate diet and insulin therapy.  Inpatient Diabetes Program Recommendations:  Diabetes history: DM2 Outpatient Diabetes medications: Lantus 14 units daily, Humalog 1-10 units TID with meals Current orders for Inpatient glycemic control: Semglee 18 units daily, Novolog 0-15 units TID with meals, Novolog 0-5 units QHS   Inpatient Diabetes Program Recommendations:     Insulin: Please consider increasing Semglee 18 units daily to start 02/03/22 (already given Semglee 10 units today; so order Semglee 8 units x1 now) and ordering Novolog 5 units TID with meals for meal coverage if patient eats at least 50% of meals  Total Time spent with patient: 30 minutes   Past Medical History:  Past Medical History:  Diagnosis Date   Acid reflux    Chronic pain    Depression    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 pylori infection    High risk medication use    Hypercholesterolemia  Hyperlipidemia, mixed    Hypertension    Other mixed anxiety disorders    Pollen allergies    Sickle cell trait ([place])    Sleep apnea    Vitamin D deficiency     Past Surgical History:  Procedure Laterality Date   ABDOMINAL  HYSTERECTOMY     APPENDECTOMY     COLONOSCOPY  08-16-2008   Dr. Ladean Raya   ESOPHAGOGASTRODUODENOSCOPY  08-16-2008   Dr. Orlena Sheldon    Family History:  Family History  Problem Relation Age of Onset   Clotting disorder Mother    Crohn's disease Mother    Heart disease Mother    Colon cancer Father    Clotting disorder Brother    Diabetes Brother    Diabetes Maternal Aunt    Liver cancer Maternal Uncle    Prostate cancer Maternal Uncle    Colon cancer Maternal Uncle    Diabetes Paternal Aunt    Esophageal cancer Paternal Uncle    Family Psychiatric History: not noted Social History:  Social History   Substance and Sexual Activity  Alcohol Use None     Social History   Substance and Sexual Activity  Drug Use Yes   Types: Marijuana    Social History   Socioeconomic History   Marital status: Married    Spouse name: Not on file   Number of children: 2   Years of education: Not on file   Highest education level: Not on file  Occupational History   Occupation: n/a  Tobacco Use   Smoking status: Every Day    Packs/day: 1.00    Types: Cigarettes   Smokeless tobacco: Never  Substance and Sexual Activity   Alcohol use: Not on file   Drug use: Yes    Types: Marijuana   Sexual activity: Not on file  Other Topics Concern   Not on file  Social History Narrative   Not on file   Social Determinants of Health   Financial Resource Strain: Not on file  Food Insecurity: Food Insecurity Present (01/30/2022)   Hunger Vital Sign    Worried About Running Out of Food in the Last Year: Often true    Ran Out of Food in the Last Year: Often true  Transportation Needs: Unknown (01/30/2022)   PRAPARE - Hydrologist (Medical): Patient refused    Lack of Transportation (Non-Medical): Patient refused  Physical Activity: Not on file  Stress: Not on file  Social Connections: Not on file   Additional Social History:   Sleep: Fair  Appetite:   Good  Current Medications: Current Facility-Administered Medications  Medication Dose Route Frequency Provider Last Rate Last Admin   albuterol (VENTOLIN HFA) 108 (90 Base) MCG/ACT inhaler 2 puff  2 puff Inhalation Q6H PRN Onuoha, Chinwendu V, NP   2 puff at 02/04/22 0757   alum & mag hydroxide-simeth (MAALOX/MYLANTA) 200-200-20 MG/5ML suspension 30 mL  30 mL Oral Q4H PRN Lucky Rathke, FNP       ARIPiprazole (ABILIFY) tablet 15 mg  15 mg Oral QHS Vann Okerlund C, FNP   15 mg at 02/03/22 2107   docusate sodium (COLACE) capsule 100 mg  100 mg Oral BID Leevy-Johnson, Brooke A, NP   100 mg at 02/04/22 0749   FLUoxetine (PROZAC) capsule 20 mg  20 mg Oral Daily Matalie Romberger C, FNP   20 mg at 02/04/22 0749   gabapentin (NEURONTIN) capsule 400 mg  400 mg Oral TID Laretta Bolster, FNP  400 mg at 02/04/22 0750   hydrOXYzine (ATARAX) tablet 25 mg  25 mg Oral TID PRN Lenard LanceAllen, Cherylee Rawlinson L, FNP   25 mg at 02/03/22 1127   ibuprofen (ADVIL) tablet 400 mg  400 mg Oral Q6H PRN Cecilie LowersNtuen, Riot Waterworth C, FNP   400 mg at 02/03/22 2215   insulin aspart (novoLOG) injection 0-15 Units  0-15 Units Subcutaneous TID WC Leevy-Johnson, Brooke A, NP   8 Units at 02/03/22 1735   insulin aspart (novoLOG) injection 0-5 Units  0-5 Units Subcutaneous QHS Onuoha, Chinwendu V, NP   5 Units at 01/31/22 2150   insulin aspart (novoLOG) injection 5 Units  5 Units Subcutaneous TID WC Leevy-Johnson, Brooke A, NP   5 Units at 02/04/22 0653   insulin glargine-yfgn (SEMGLEE) injection 18 Units  18 Units Subcutaneous Daily Leevy-Johnson, Brooke A, NP   18 Units at 02/04/22 0756   lisinopril (ZESTRIL) tablet 10 mg  10 mg Oral Daily Ronella Plunk, Jesusita Okaina C, FNP   10 mg at 02/04/22 0749   risperiDONE (RISPERDAL M-TABS) disintegrating tablet 2 mg  2 mg Oral Q8H PRN Aryon Nham, Jesusita Okaina C, FNP       And   LORazepam (ATIVAN) tablet 1 mg  1 mg Oral PRN Ernestene Coover, Jesusita Okaina C, FNP       And   ziprasidone (GEODON) injection 20 mg  20 mg Intramuscular PRN Gregg Holster, Jesusita Okaina C, FNP       magnesium  hydroxide (MILK OF MAGNESIA) suspension 30 mL  30 mL Oral Daily PRN Lenard LanceAllen, Twilia Yaklin L, FNP       nicotine polacrilex (NICORETTE) gum 2 mg  2 mg Oral PRN Sindy GuadeloupeWilliams, Roy, NP   2 mg at 02/04/22 0751   ondansetron (ZOFRAN-ODT) disintegrating tablet 4 mg  4 mg Oral Q8H PRN Jilliann Subramanian, Jesusita Okaina C, FNP       pantoprazole (PROTONIX) EC tablet 40 mg  40 mg Oral Daily Leevy-Johnson, Brooke A, NP   40 mg at 02/04/22 0750   prazosin (MINIPRESS) capsule 1 mg  1 mg Oral QHS Sarinah Doetsch C, FNP   1 mg at 02/01/22 2112   senna (SENOKOT) tablet 8.6 mg  1 tablet Oral Daily PRN Leevy-Johnson, Brooke A, NP   8.6 mg at 02/01/22 2112   traZODone (DESYREL) tablet 100 mg  100 mg Oral QHS PRN Cecilie LowersNtuen, Rahel Carlton C, FNP   100 mg at 02/01/22 2112    Lab Results:  Results for orders placed or performed during the hospital encounter of 01/29/22 (from the past 48 hour(s))  Glucose, capillary     Status: Abnormal   Collection Time: 02/02/22 12:01 PM  Result Value Ref Range   Glucose-Capillary 220 (H) 70 - 99 mg/dL    Comment: Glucose reference range applies only to samples taken after fasting for at least 8 hours.  Glucose, capillary     Status: Abnormal   Collection Time: 02/02/22  5:09 PM  Result Value Ref Range   Glucose-Capillary 225 (H) 70 - 99 mg/dL    Comment: Glucose reference range applies only to samples taken after fasting for at least 8 hours.  Glucose, capillary     Status: Abnormal   Collection Time: 02/02/22  8:27 PM  Result Value Ref Range   Glucose-Capillary 130 (H) 70 - 99 mg/dL    Comment: Glucose reference range applies only to samples taken after fasting for at least 8 hours.  Glucose, capillary     Status: Abnormal   Collection Time: 02/03/22  6:16 AM  Result Value  Ref Range   Glucose-Capillary 212 (H) 70 - 99 mg/dL    Comment: Glucose reference range applies only to samples taken after fasting for at least 8 hours.  Glucose, capillary     Status: Abnormal   Collection Time: 02/03/22 11:26 AM  Result Value Ref  Range   Glucose-Capillary 249 (H) 70 - 99 mg/dL    Comment: Glucose reference range applies only to samples taken after fasting for at least 8 hours.  Glucose, capillary     Status: Abnormal   Collection Time: 02/03/22  4:54 PM  Result Value Ref Range   Glucose-Capillary 256 (H) 70 - 99 mg/dL    Comment: Glucose reference range applies only to samples taken after fasting for at least 8 hours.  Glucose, capillary     Status: Abnormal   Collection Time: 02/03/22  8:31 PM  Result Value Ref Range   Glucose-Capillary 116 (H) 70 - 99 mg/dL    Comment: Glucose reference range applies only to samples taken after fasting for at least 8 hours.   Comment 1 Notify RN   Glucose, capillary     Status: Abnormal   Collection Time: 02/04/22  6:14 AM  Result Value Ref Range   Glucose-Capillary 201 (H) 70 - 99 mg/dL    Comment: Glucose reference range applies only to samples taken after fasting for at least 8 hours.   Comment 1 Notify RN     Blood Alcohol level:  Lab Results  Component Value Date   [facility]  12/11/2009    <5        LOWEST DETECTABLE LIMIT FOR SERUM ALCOHOL IS 5 mg/dL FOR MEDICAL PURPOSES ONLY    Metabolic Disorder Labs: Lab Results  Component Value Date   HGBA1C 12.7 (H) 02/01/2022   MPG 318 02/01/2022   No results found for: "PROLACTIN" Lab Results  Component Value Date   CHOL 204 (H) 02/01/2022   TRIG 61 02/01/2022   HDL 61 02/01/2022   CHOLHDL 3.3 02/01/2022   VLDL 12 02/01/2022   LDLCALC 131 (H) 02/01/2022    Physical Findings: AIMS:  , ,  ,  ,    CIWA:    COWS:     Musculoskeletal: Strength & Muscle Tone: within normal limits Gait & Station: normal Patient leans: N/A  Psychiatric Specialty Exam:  Presentation  General Appearance:  Appropriate for Environment; Casual  Eye Contact: Good  Speech: Clear and Coherent; Normal Rate  Speech Volume: Normal  Handedness: Right  Mood and Affect  Mood: Anxious; Depressed  Affect: Appropriate;  Congruent  Thought Process  Thought Processes: Coherent  Descriptions of Associations:Intact  Orientation:Full (Time, Place and Person)  Thought Content:Logical  History of Schizophrenia/Schizoaffective disorder:No data recorded Duration of Psychotic Symptoms:No data recorded Hallucinations:Hallucinations: None  Ideas of Reference:None  Suicidal Thoughts:Suicidal Thoughts: No SI Passive Intent and/or Plan: -- (n/a)  Homicidal Thoughts:Homicidal Thoughts: No  Sensorium  Memory: Immediate Good; Recent Good  Judgment: Fair  Insight: Fair  Art therapist  Concentration: Good  Attention Span: Good  Recall: Good  Fund of Knowledge: Fair  Language: Good  Psychomotor Activity  Psychomotor Activity: Psychomotor Activity: Normal  Assets  Assets: Communication Skills; Desire for Improvement; Physical Health; Resilience  Sleep  Sleep: Sleep: Good Number of Hours of Sleep: 7  Physical Exam: Physical Exam Vitals and nursing note reviewed.  Constitutional:      Appearance: She is normal weight.  HENT:     Head: Normocephalic.     Nose: Nose normal.  Mouth/Throat:     Mouth: Mucous membranes are moist.     Pharynx: Oropharynx is clear.  Eyes:     Conjunctiva/sclera: Conjunctivae normal.     Pupils: Pupils are equal, round, and reactive to light.  Cardiovascular:     Rate and Rhythm: Tachycardia present.     Comments: Blood pressure 109/77, pulse 124.  Nursing staff to recheck vital signs. Pulmonary:     Effort: Pulmonary effort is normal.  Abdominal:     Palpations: Abdomen is soft.  Genitourinary:    Comments: Deferred Musculoskeletal:        General: Normal range of motion.     Cervical back: Normal range of motion.  Skin:    General: Skin is warm and dry.  Neurological:     Mental Status: She is alert and oriented to person, place, and time.  Psychiatric:        Attention and Perception: Attention and perception normal. She does  not perceive auditory or visual hallucinations.        Mood and Affect: Mood normal. Affect is blunt.        Speech: Speech normal.        Behavior: Behavior normal. Behavior is cooperative.        Thought Content: Thought content is not paranoid or delusional. Thought content does not include homicidal or suicidal ideation. Thought content does not include homicidal or suicidal plan.        Cognition and Memory: Cognition and memory normal.        Judgment: Judgment normal.    Review of Systems  Constitutional:  Positive for weight loss.  HENT: Negative.    Respiratory: Negative.    Cardiovascular: Negative.        Blood pressure 109/77, pulse 124.  Nursing staff to recheck vital signs.   Genitourinary: Negative.   Skin: Negative.        Tattoos to bilateral arms and chest  Neurological: Negative.   Endo/Heme/Allergies: Negative.   Psychiatric/Behavioral:  Positive for depression and substance abuse.   All other systems reviewed and are negative.  Blood pressure (!) 134/93, pulse (!) 116, temperature 97.9 F (36.6 C), temperature source Oral, resp. rate 18, height 5' 4.5" (1.638 m), weight 67.1 kg, SpO2 100 %. Body mass index is 25.01 kg/m.  Treatment Plan Summary: Daily contact with patient to assess and evaluate symptoms and progress in treatment, Medication management, and Plan   PLAN:  Safety and Monitoring: Voluntary admission to inpatient psychiatric unit for safety, stabilization and treatment Daily contact with patient to assess and evaluate symptoms and progress in treatment Patient's case to be discussed in multi-disciplinary team meeting Observation Level : q15 minute checks Vital signs: q12 hours Precautions: suicide, but pt currently verbally contracts for safety on unit    Major depressive disorder: -- Fluoxetine 20 mg capsule p.o. daily   Schizoaffective disorder/bipolar disorder --Abilify 15 mg tablets 1 daily at bedtime   Anxiety --Hydroxyzine 25 mg 3  times daily as needed/anxiety --Gabapentin 300 mg caps 3 times daily daily   Insomnia -Trazodone 100 mg p.o. at bedtime as needed   GERD: -- Omeprazole 40 mg 1 p.o. daily  Constipation:  -- Colace 100 mg BID   Hypertension: -- Lisinopril 10 mg p.o. daily   PTSD: -- Prazosin 1 mg p.o. daily  Diabetes Type 2:  CHANGE: -- Novolog 5 units SQ TID with meals -- Semglee 18 units SQ daily Continue: -- Novolog 0-15 units SQ TID with meals. --  Novolog 0-5 units SQ daily at bedtime  Other PRN Medications -Acetaminophen 650 mg every 6 hrs as needed/mild pain -Maalox 30 mL oral every 4 hrs as needed/digestion -Magnesium hydroxide 30 mL daily as needed/mild constipation --Zofran disintegrating tabs 4 mg 1 every 6 hours as needed for nausea   Discharge Planning: Social work and case management to assist with discharge planning and identification of hospital follow-up needs prior to discharge Estimated LOS: 5-7 days Discharge Concerns: Need to establish a safety plan; Medication compliance and effectiveness Discharge Goals: Return home with outpatient referrals for mental health follow-up including medication management/psychotherapy.  Cecilie Lowers, FNP 02/04/2022, 11:39 AM Patient ID: [name], female   DOB: [birth year], 47 y.o.   MRN: [id number] Patient ID: DNIYA NEUHAUS, female   DOB: 1975/05/07, 47 y.o.   MRN: [id number] Patient ID: ELLIETT GUARISCO, female   DOB: 05/18/1975, 47 y.o.   MRN: [id number]

## 2022-02-04 NOTE — Progress Notes (Addendum)
Pt says she is in good spirits, denies SI/HI/AVH.  Interacting with peers and attending groups.  No immediate needs and/or concerns are identified at this time.  Pt tolerating insulin administration without incident.

## 2022-02-04 NOTE — Group Note (Signed)
LCSW Group Therapy Note  Group Date: 02/04/2022 Start Time: 1100 End Time: 1200   Type of Therapy and Topic:  Group Therapy: Positive Affirmations  Participation Level:  Active   Description of Group:   This group addressed positive affirmation towards self and others.  Patients went around the room and identified two positive things about themselves and two positive things about a peer in the room.  Patients reflected on how it felt to share something positive with others, to identify positive things about themselves, and to hear positive things from others/ Patients were encouraged to have a daily reflection of positive characteristics or circumstances.   Therapeutic Goals: Patients will verbalize two of their positive qualities Patients will demonstrate empathy for others by stating two positive qualities about a peer in the group Patients will verbalize their feelings when voicing positive self affirmations and when voicing positive affirmations of others Patients will discuss the potential positive impact on their wellness/recovery of focusing on positive traits of self and others.  Summary of Patient Progress:  Sara [name] actively engaged in the discussion and . Sara [name] was able to identify positive affirmations about herself as well as other group members. Patient demonstrated good insight into the subject matter, was respectful of peers, participated throughout the entire session.  Therapeutic Modalities:   Cognitive Behavioral Therapy Motivational Interviewing    Windle Guard, LCSW 02/04/2022  1:44 PM

## 2022-02-04 NOTE — Group Note (Signed)
Recreation Therapy Group Note   Group Topic:Animal Assisted Therapy   Group Date: 02/04/2022 Start Time: 1430 End Time: 1515 Facilitators: Obed Samek-McCall, LRT,CTRS Location: [address] Dayroom   Animal-Assisted Activity (AAA) Program Checklist/Progress Notes Patient Eligibility Criteria Checklist & Daily Group note for Rec Tx Intervention  AAA/T Program Assumption of Risk Form signed by Patient/ or Parent Legal Guardian Yes  Patient is free of allergies or severe asthma Yes  Patient reports no fear of animals Yes  Patient reports no history of cruelty to animals Yes  Patient understands his/her participation is voluntary Yes  Patient washes hands before animal contact Yes  Patient washes hands after animal contact Yes   Affect/Mood: Appropriate   Participation Level: Engaged   Participation Quality: Independent   Behavior: Appropriate   Speech/Thought Process: Focused    Clinical Observations/Individualized Feedback:    Patient attended session and interacted appropriately with therapy dog and peers. Patient asked appropriate questions about therapy dog and his training. Patient shared stories about their pets at home with group.   Plan: Continue to engage patient in RT group sessions 2-3x/week.   Kentley Cedillo-McCall, LRT,CTRS 02/04/2022 3:34 PM

## 2022-02-04 NOTE — Progress Notes (Signed)
Pt refused blood sugar check before dinner.

## 2022-02-04 NOTE — Progress Notes (Signed)
   02/04/22 0000  Psych Admission Type (Psych Patients Only)  Admission Status Voluntary  Psychosocial Assessment  Patient Complaints Anxiety  Eye Contact Fair  Facial Expression Anxious  Affect Appropriate to circumstance  Speech Logical/coherent  Interaction Assertive  Motor Activity Slow  Appearance/Hygiene Unremarkable  Behavior Characteristics Cooperative;Appropriate to situation  Mood Anxious;Pleasant  Thought Process  Coherency WDL  Content WDL  Delusions None reported or observed  Perception WDL  Hallucination None reported or observed  Judgment Poor  Confusion None  Danger to Self  Self-Injurious Behavior No self-injurious ideation or behavior indicators observed or expressed   Agreement Not to Harm Self Yes  Description of Agreement verbally contracts for safety  Danger to Others  Danger to Others None reported or observed  Danger to Others Abnormal  Harmful Behavior to others No threats or harm toward other people  Destructive Behavior No threats or harm toward property   Alert/oriented. Makes needs/concerns known to staff. Pleasant cooperative with staff. Denies SI/HI/A/V hallucinations. Med compliant. PRN med given with good effect. Patient states went to group. Will encourage continue compliance and progression towards goals. Verbally contracted for safety. Will continue to monitor.

## 2022-02-04 NOTE — Progress Notes (Signed)
   02/04/22 2300  Psych Admission Type (Psych Patients Only)  Admission Status Voluntary  Psychosocial Assessment  Patient Complaints None  Eye Contact Fair  Facial Expression Anxious  Affect Appropriate to circumstance  Speech Logical/coherent  Interaction Assertive  Motor Activity Slow  Appearance/Hygiene Unremarkable  Behavior Characteristics Appropriate to situation  Mood Anxious  Thought Process  Coherency WDL  Content WDL  Delusions None reported or observed  Perception WDL  Hallucination None reported or observed  Judgment WDL  Confusion None  Danger to Self  Current suicidal ideation? Denies  Agreement Not to Harm Self Yes  Description of Agreement verbally contracts  Danger to Others  Danger to Others None reported or observed  Danger to Others Abnormal  Harmful Behavior to others No threats or harm toward other people  Destructive Behavior No threats or harm toward property   Alert/oriented. Makes needs/concerns known to staff. Pleasant with staff. Denies SI/HI/A/V hallucinations. Incompliant with meds. Will encourage to comply with med regimen.. Refused CBG and coverage. Patient states will allow check for blood sugar in AM.  PRN med given with good effect. Patient states went to group. Verbally contracted for safety. Will continue to monitor.

## 2022-02-04 NOTE — Inpatient Diabetes Management (Signed)
Inpatient Diabetes Program Recommendations  AACE/ADA: New Consensus Statement on Inpatient Glycemic Control (2015)  Target Ranges:  Prepandial:   less than 140 mg/dL      Peak postprandial:   less than 180 mg/dL (1-2 hours)      Critically ill patients:  140 - 180 mg/dL   Lab Results  Component Value Date   GLUCAP 201 (H) 02/04/2022   HGBA1C 12.7 (H) 02/01/2022    Review of Glycemic Control  Diabetes history: DM 2 Outpatient Diabetes medications: Lantus 14 units Daily, Humalog 1-10 units tid Current orders for Inpatient glycemic control:  Semglee 18 units Daily Novolog 0-15 units tid + hs Novolog 5 units tid meal coverage  Inpatient Diabetes Program Recommendations:    -   Increase Semglee to 20 units -   Increase Novolog meal coverage from 5 units  to 8 units tid   Will call pt later today.  Thanks,  Tama Headings RN, MSN, BC-ADM Inpatient Diabetes Coordinator Team Pager [phone] (8a-5p)

## 2022-02-04 NOTE — Plan of Care (Signed)
  Problem: Safety: Goal: Periods of time without injury will increase Outcome: Progressing   Problem: Coping: Goal: Will verbalize feelings Outcome: Progressing   

## 2022-02-05 ENCOUNTER — Encounter (HOSPITAL_COMMUNITY): Payer: Self-pay

## 2022-02-05 LAB — GLUCOSE, CAPILLARY
Glucose-Capillary: 293 mg/dL — ABNORMAL HIGH (ref 70–99)
Glucose-Capillary: 249 mg/dL — ABNORMAL HIGH (ref 70–99)
Glucose-Capillary: 185 mg/dL — ABNORMAL HIGH (ref 70–99)
Glucose-Capillary: 93 mg/dL (ref 70–99)

## 2022-02-05 NOTE — Inpatient Diabetes Management (Addendum)
Inpatient Diabetes Program Recommendations  AACE/ADA: New Consensus Statement on Inpatient Glycemic Control  Target Ranges:  Prepandial:   less than 140 mg/dL      Peak postprandial:   less than 180 mg/dL (1-2 hours)      Critically ill patients:  140 - 180 mg/dL    Latest Reference Range & Units 02/04/22 06:14 02/04/22 12:06 02/05/22 09:28  Glucose-Capillary 70 - 99 mg/dL 201 (H)  Novolog 5 units  Semglee 18 units @7 :56 255 (H)  Novolog 13 units 293 (H)     Semglee 18 units    Latest Reference Range & Units 02/03/22 06:16 02/03/22 11:26 02/03/22 16:54 02/03/22 20:31  Glucose-Capillary 70 - 99 mg/dL 212 (H)  Novolog 7 units  Semglee 18 units @8 :36 249 (H)  Novolog 10 units 256 (H)  Novolog 13 units 116 (H)   Review of Glycemic Control  Diabetes history: DM2 Outpatient Diabetes medications: Lantus 14 units daily, Humalog 1-10 units TID with meals Current orders for Inpatient glycemic control: Semglee 18 units daily, Novolog 0-15 units TID with meals, Novolog 0-5 units QHS, Novolog 5 units TID with meals   Inpatient Diabetes Program Recommendations:     Insulin: Please consider increasing Semglee to 22 units daily to start 02/06/22 (if agreeable order Semglee 4 units x1 now since already given 18 units today).   Outpatient DM education: Please order Ambulatory referral to Nutrition and Diabetes  Education for 'Follow up for diabetes education. Pt currently inpatient at [facility]. Patient reported intentionally stopped eating and taking her insulin and eating to induce diabetic coma for suicide. A1C 12.7% on 02/01/22.'  NOTE: Noted patient refused CBGs and insulin yesterday at 17:00 and 22:00 and again this morning at 8:00 am.    Addendum 01/27/22@14 :20-Sent chat message to Duwayne Heck, RN to ask for phone number to talk with patient over the phone regarding DM control if patient appropriate for education. RN feels education over the phone would not be effective for patient.  Inpatient diabetes team does not come to [facility] campus to see patients face to face. Anticipate patient's mental health issues are a barrier for her to manage DM effectively. May be more beneficial for patient to have DM education follow up as an outpatient once mental health is improved. If so, please order Ambulatory referral to Nutrition and Diabetes Education Center.   Thanks, Barnie Alderman, RN, MSN, [facility] Diabetes Coordinator Inpatient Diabetes Program [phone] (Team Pager from 8am to [place])

## 2022-02-05 NOTE — Progress Notes (Signed)
[facility] MD Progress Note  02/05/2022 11:40 AM Sara [name]  MRN:  [id number]  Principal Problem: MDD (major depressive disorder), recurrent episode, severe (HCC) Diagnosis: Principal Problem:   MDD (major depressive disorder), recurrent episode, severe (HCC)  HPI: Sara [name] is a 47 year old African American female with a past psychiatric diagnoses of schizoaffective disorder, bipolar, and major depressive disorder recurrent severe who presented voluntarily to [facility] 01/29/22 from [facility] ED for worsening depression and suicidal thoughts. Patient noted to have multiple medical co-morbidities including diabetes, sickle cell trait, hypertension, hyperlipidemia mixed. On initial assessment patient reported increased stressors related to homelessness, substance abuse (cocaine and marijuana), family issues (recently asked to leave son's home), and increased depression with suicidal ideations where she reports intentionally stopped eating and taking her insulin and eating to induce diabetic coma for suicide. She reports significant weight-loss as a result.   24 hr chart review:  Received PRNs' Albuterol yesterday for wheezing and shortness of breath, ibuprofen requested last night and this morning for moderate pain, nicotine gum last night for smoking cessation, and trazodone last night for sleep. Patient remains on carb modified diet for her diabetes and monitored by diabetic educator. Patient noncompliant with insulin orders,CBG 249. Patient reports sleeping over 8 hours hour last night.  Blood pressure 88/69, lisinopril not given this morning, pulse rate of 117.  Increase p.o. fluid intake encouraged, and nursing staff to recheck vital signs.  Prazosin DC'd this morning due to patient not taking x 4 days and low blood pressure.    Assessment: Patient seen and examined in her room on [address], with the Child psychotherapist.  Discussed discharge planning regarding her accommodation.  Patient reports making  some phone calls to several shelters and [employer]  Expecting return calls from them.  Her hygiene and appearance is fairly groomed. As per recreation therapy group notes, patient participated and got engaged in the activities.  Alert and oriented x 4. She reports sleeping through the night and being restful. Reports mood is less depressed today. Denies SI, HI, or AVH. Continues to contract for safety.  As per nursing report, patient noncompliant with insulin therapy,CBG 249.  Blood pressure low at 88/69, lisinopril held this morning.  Prazosin discontinued due to no complaint of weird dreams, patient not taking x 4 days and low blood pressure.  No paranoia or delusional thinking observed during assessment. Supportive therapy provided on ongoing stressors, and encouraged patient to stick to her low carbohydrate diet and insulin therapy.  Inpatient Diabetes Program Recommendations:  Diabetes history: DM2 Outpatient Diabetes medications: Lantus 14 units daily, Humalog 1-10 units TID with meals Current orders for Inpatient glycemic control: Semglee 18 units daily, Novolog 0-15 units TID with meals, Novolog 0-5 units QHS   Inpatient Diabetes Program Recommendations:     Insulin: Please consider increasing Semglee 18 units daily to start 02/03/22 (already given Semglee 10 units today; so order Semglee 8 units x1 now) and ordering Novolog 5 units TID with meals for meal coverage if patient eats at least 50% of meals  Total Time spent with patient: 30 minutes   Past Medical History:  Past Medical History:  Diagnosis Date   Acid reflux    Chronic pain    Depression    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 pylori infection    High risk medication use  Hypercholesterolemia    Hyperlipidemia, mixed    Hypertension    Other mixed anxiety disorders    Pollen allergies    Sickle cell trait (HCC)    Sleep apnea     Vitamin D deficiency     Past Surgical History:  Procedure Laterality Date   ABDOMINAL HYSTERECTOMY     APPENDECTOMY     COLONOSCOPY  08-16-2008   Dr. Vallarie Mare   ESOPHAGOGASTRODUODENOSCOPY  08-16-2008   Dr. Rayfield Citizen    Family History:  Family History  Problem Relation Age of Onset   Clotting disorder Mother    Crohn's disease Mother    Heart disease Mother    Colon cancer Father    Clotting disorder Brother    Diabetes Brother    Diabetes Maternal Aunt    Liver cancer Maternal Uncle    Prostate cancer Maternal Uncle    Colon cancer Maternal Uncle    Diabetes Paternal Aunt    Esophageal cancer Paternal Uncle    Family Psychiatric History: not noted Social History:  Social History   Substance and Sexual Activity  Alcohol Use None     Social History   Substance and Sexual Activity  Drug Use Yes   Types: Marijuana    Social History   Socioeconomic History   Marital status: Married    Spouse name: Not on file   Number of children: 2   Years of education: Not on file   Highest education level: Not on file  Occupational History   Occupation: n/a  Tobacco Use   Smoking status: Every Day    Packs/day: 1.00    Types: Cigarettes   Smokeless tobacco: Never  Substance and Sexual Activity   Alcohol use: Not on file   Drug use: Yes    Types: Marijuana   Sexual activity: Not on file  Other Topics Concern   Not on file  Social History Narrative   Not on file   Social Determinants of Health   Financial Resource Strain: Not on file  Food Insecurity: Food Insecurity Present (01/30/2022)   Hunger Vital Sign    Worried About Running Out of Food in the Last Year: Often true    Ran Out of Food in the Last Year: Often true  Transportation Needs: Unknown (01/30/2022)   PRAPARE - Administrator, Civil Service (Medical): Patient refused    Lack of Transportation (Non-Medical): Patient refused  Physical Activity: Not on file  Stress: Not on file  Social  Connections: Not on file   Additional Social History:   Sleep: Fair  Appetite:  Good  Current Medications: Current Facility-Administered Medications  Medication Dose Route Frequency Provider Last Rate Last Admin   albuterol (VENTOLIN HFA) 108 (90 Base) MCG/ACT inhaler 2 puff  2 puff Inhalation Q6H PRN Onuoha, Chinwendu V, NP   2 puff at 02/04/22 0757   alum & mag hydroxide-simeth (MAALOX/MYLANTA) 200-200-20 MG/5ML suspension 30 mL  30 mL Oral Q4H PRN Lenard Lance, FNP       ARIPiprazole (ABILIFY) tablet 15 mg  15 mg Oral QHS Dayleen Beske C, FNP   15 mg at 02/03/22 2107   docusate sodium (COLACE) capsule 100 mg  100 mg Oral BID Leevy-Johnson, Brooke A, NP   100 mg at 02/05/22 0802   FLUoxetine (PROZAC) capsule 20 mg  20 mg Oral Daily Veronia Laprise C, FNP   20 mg at 02/05/22 0802   gabapentin (NEURONTIN) capsule 400 mg  400 mg Oral TID  Laretta Bolster, FNP   400 mg at 02/05/22 3818   hydrOXYzine (ATARAX) tablet 25 mg  25 mg Oral TID PRN Lucky Rathke, FNP   25 mg at 02/03/22 1127   ibuprofen (ADVIL) tablet 400 mg  400 mg Oral Q6H PRN Laretta Bolster, FNP   400 mg at 02/05/22 0803   insulin aspart (novoLOG) injection 0-15 Units  0-15 Units Subcutaneous TID WC Leevy-Johnson, Brooke A, NP   8 Units at 02/04/22 1224   insulin aspart (novoLOG) injection 0-5 Units  0-5 Units Subcutaneous QHS Onuoha, Chinwendu V, NP   5 Units at 01/31/22 2150   insulin aspart (novoLOG) injection 5 Units  5 Units Subcutaneous TID WC Leevy-Johnson, Brooke A, NP   5 Units at 02/04/22 1223   insulin glargine-yfgn (SEMGLEE) injection 18 Units  18 Units Subcutaneous Daily Leevy-Johnson, Brooke A, NP   18 Units at 02/05/22 0930   lisinopril (ZESTRIL) tablet 10 mg  10 mg Oral Daily Rishit Burkhalter, Kris Hartmann, FNP   10 mg at 02/04/22 0749   risperiDONE (RISPERDAL M-TABS) disintegrating tablet 2 mg  2 mg Oral Q8H PRN Adlai Nieblas, Kris Hartmann, FNP       And   LORazepam (ATIVAN) tablet 1 mg  1 mg Oral PRN Percilla Tweten, Kris Hartmann, FNP       And   ziprasidone  (GEODON) injection 20 mg  20 mg Intramuscular PRN Felicite Zeimet, Kris Hartmann, FNP       magnesium hydroxide (MILK OF MAGNESIA) suspension 30 mL  30 mL Oral Daily PRN Lucky Rathke, FNP       nicotine polacrilex (NICORETTE) gum 2 mg  2 mg Oral PRN Evette Georges, NP   2 mg at 02/04/22 0751   ondansetron (ZOFRAN-ODT) disintegrating tablet 4 mg  4 mg Oral Q8H PRN Murdock Jellison, Kris Hartmann, FNP       pantoprazole (PROTONIX) EC tablet 40 mg  40 mg Oral Daily Leevy-Johnson, Brooke A, NP   40 mg at 02/05/22 0802   prazosin (MINIPRESS) capsule 1 mg  1 mg Oral QHS Emmitt Matthews C, FNP   1 mg at 02/01/22 2112   senna (SENOKOT) tablet 8.6 mg  1 tablet Oral Daily PRN Leevy-Johnson, Brooke A, NP   8.6 mg at 02/01/22 2112   traZODone (DESYREL) tablet 100 mg  100 mg Oral QHS PRN Laretta Bolster, FNP   100 mg at 02/04/22 2223    Lab Results:  Results for orders placed or performed during the hospital encounter of 01/29/22 (from the past 48 hour(s))  Glucose, capillary     Status: Abnormal   Collection Time: 02/03/22  4:54 PM  Result Value Ref Range   Glucose-Capillary 256 (H) 70 - 99 mg/dL    Comment: Glucose reference range applies only to samples taken after fasting for at least 8 hours.  Glucose, capillary     Status: Abnormal   Collection Time: 02/03/22  8:31 PM  Result Value Ref Range   Glucose-Capillary 116 (H) 70 - 99 mg/dL    Comment: Glucose reference range applies only to samples taken after fasting for at least 8 hours.   Comment 1 Notify RN   Glucose, capillary     Status: Abnormal   Collection Time: 02/04/22  6:14 AM  Result Value Ref Range   Glucose-Capillary 201 (H) 70 - 99 mg/dL    Comment: Glucose reference range applies only to samples taken after fasting for at least 8 hours.   Comment 1 Notify RN  Glucose, capillary     Status: Abnormal   Collection Time: 02/04/22 12:06 PM  Result Value Ref Range   Glucose-Capillary 255 (H) 70 - 99 mg/dL    Comment: Glucose reference range applies only to samples taken after  fasting for at least 8 hours.  Glucose, capillary     Status: Abnormal   Collection Time: 02/05/22  9:28 AM  Result Value Ref Range   Glucose-Capillary 293 (H) 70 - 99 mg/dL    Comment: Glucose reference range applies only to samples taken after fasting for at least 8 hours.    Blood Alcohol level:  Lab Results  Component Value Date   [facility]  12/11/2009    <5        LOWEST DETECTABLE LIMIT FOR SERUM ALCOHOL IS 5 mg/dL FOR MEDICAL PURPOSES ONLY    Metabolic Disorder Labs: Lab Results  Component Value Date   HGBA1C 12.7 (H) 02/01/2022   MPG 318 02/01/2022   No results found for: "PROLACTIN" Lab Results  Component Value Date   CHOL 204 (H) 02/01/2022   TRIG 61 02/01/2022   HDL 61 02/01/2022   CHOLHDL 3.3 02/01/2022   VLDL 12 02/01/2022   LDLCALC 131 (H) 02/01/2022    Physical Findings: AIMS:  , ,  ,  ,    CIWA:    COWS:     Musculoskeletal: Strength & Muscle Tone: within normal limits Gait & Station: normal Patient leans: N/A  Psychiatric Specialty Exam:  Presentation  General Appearance:  Appropriate for Environment; Casual; Fairly Groomed  Eye Contact: Good  Speech: Clear and Coherent; Normal Rate  Speech Volume: Normal  Handedness: Right  Mood and Affect  Mood: Euthymic  Affect: Appropriate; Congruent  Thought Process  Thought Processes: Coherent; Goal Directed  Descriptions of Associations:Intact  Orientation:Full (Time, Place and Person)  Thought Content:Logical  History of Schizophrenia/Schizoaffective disorder:No data recorded Duration of Psychotic Symptoms:No data recorded Hallucinations:Hallucinations: None  Ideas of Reference:None  Suicidal Thoughts:Suicidal Thoughts: No SI Passive Intent and/or Plan: -- (Denies)  Homicidal Thoughts:Homicidal Thoughts: No  Sensorium  Memory: Immediate Good; Recent Good  Judgment: Fair  Insight: Fair  Community education officer  Concentration: Good  Attention  Span: Good  Recall: Good  Fund of Knowledge: Fair  Language: Good  Psychomotor Activity  Psychomotor Activity: Psychomotor Activity: Normal  Assets  Assets: Communication Skills; Desire for Improvement; Physical Health  Sleep  Sleep: Sleep: Good Number of Hours of Sleep: 8  Physical Exam: Physical Exam Vitals and nursing note reviewed.  Constitutional:      Appearance: She is normal weight.  HENT:     Head: Normocephalic.     Nose: Nose normal.     Mouth/Throat:     Mouth: Mucous membranes are moist.     Pharynx: Oropharynx is clear.  Eyes:     Conjunctiva/sclera: Conjunctivae normal.     Pupils: Pupils are equal, round, and reactive to light.  Cardiovascular:     Rate and Rhythm: Tachycardia present.     Comments: Blood pressure 109/77, pulse 124.  Nursing staff to recheck vital signs. Pulmonary:     Effort: Pulmonary effort is normal.  Abdominal:     Palpations: Abdomen is soft.  Genitourinary:    Comments: Deferred Musculoskeletal:        General: Normal range of motion.     Cervical back: Normal range of motion.  Skin:    General: Skin is warm and dry.  Neurological:     Mental Status: She is  alert and oriented to person, place, and time.  Psychiatric:        Attention and Perception: Attention and perception normal. She does not perceive auditory or visual hallucinations.        Mood and Affect: Mood normal. Affect is blunt.        Speech: Speech normal.        Behavior: Behavior normal. Behavior is cooperative.        Thought Content: Thought content is not paranoid or delusional. Thought content does not include homicidal or suicidal ideation. Thought content does not include homicidal or suicidal plan.        Cognition and Memory: Cognition and memory normal.        Judgment: Judgment normal.   Review of Systems  Constitutional:  Positive for weight loss.  HENT: Negative.    Respiratory: Negative.    Cardiovascular: Negative.        Blood  pressure 109/77, pulse 124.  Nursing staff to recheck vital signs.   Genitourinary: Negative.   Musculoskeletal: Negative.   Skin: Negative.        Tattoos to bilateral arms and chest  Neurological: Negative.   Endo/Heme/Allergies: Negative.   Psychiatric/Behavioral:  Positive for depression and substance abuse. The patient is nervous/anxious.   All other systems reviewed and are negative.  Blood pressure (!) 86/69, pulse (!) 117, temperature 98.3 F (36.8 C), temperature source Oral, resp. rate 18, height 5' 4.5" (1.638 m), weight 67.1 kg, SpO2 100 %. Body mass index is 25.01 kg/m.  Treatment Plan Summary: Daily contact with patient to assess and evaluate symptoms and progress in treatment, Medication management, and Plan   PLAN:  Safety and Monitoring: Voluntary admission to inpatient psychiatric unit for safety, stabilization and treatment Daily contact with patient to assess and evaluate symptoms and progress in treatment Patient's case to be discussed in multi-disciplinary team meeting Observation Level : q15 minute checks Vital signs: q12 hours Precautions: suicide, but pt currently verbally contracts for safety on unit    Major depressive disorder: -- Fluoxetine 20 mg capsule p.o. daily   Schizoaffective disorder/bipolar disorder --Abilify 15 mg tablets 1 daily at bedtime   Anxiety --Hydroxyzine 25 mg 3 times daily as needed/anxiety --Gabapentin 300 mg caps 3 times daily daily   Insomnia -Trazodone 100 mg p.o. at bedtime as needed   GERD: -- Omeprazole 40 mg 1 p.o. daily  Constipation:  -- Colace 100 mg BID   Hypertension: -- Lisinopril 10 mg p.o. daily   PTSD: -- Prazosin 1 mg p.o. daily.  Discontinue today 02/05/2022  Diabetes Type 2:  CHANGE: -- Novolog 5 units SQ TID with meals -- Semglee 18 units SQ daily Continue: -- Novolog 0-15 units SQ TID with meals. --Novolog 0-5 units SQ daily at bedtime  Other PRN Medications -Acetaminophen 650 mg every 6  hrs as needed/mild pain -Maalox 30 mL oral every 4 hrs as needed/digestion -Magnesium hydroxide 30 mL daily as needed/mild constipation --Zofran disintegrating tabs 4 mg 1 every 6 hours as needed for nausea   Discharge Planning: Social work and case management to assist with discharge planning and identification of hospital follow-up needs prior to discharge Estimated LOS: 5-7 days Discharge Concerns: Need to establish a safety plan; Medication compliance and effectiveness Discharge Goals: Return home with outpatient referrals for mental health follow-up including medication management/psychotherapy.  Cecilie Lowers, FNP 02/05/2022, 11:40 AM Patient ID: [name], female   DOB: 12-Mar-1975, 47 y.o.   MRN: [id number] Patient ID:  [name], female   DOB: [birth year], 47 y.o.   MRN: [id number] Patient ID: LAVONIA EAGER, female   DOB: [birth year], 48 y.o.   MRN: [id number] Patient ID: ALEZA PEW, female   DOB: 06-20-1975, 47 y.o.   MRN: [id number]

## 2022-02-05 NOTE — Progress Notes (Signed)
Refused am CBG and scheduled insulin.  Will continue to encourage compliance.

## 2022-02-05 NOTE — Progress Notes (Signed)
D: Patient is alert, oriented, and cooperative. Denies SI, HI, AVH, and verbally contracts for safety.    A: Scheduled medications administered per MD order. Support provided. Patient educated on safety on the unit and medications. Routine safety checks every 15 minutes. Patient stated understanding to tell nurse about any new physical symptoms. Patient understands to tell staff of any needs.     R: No adverse drug reactions noted. Patient verbally contracts for safety. Patient remains safe at this time and will continue to monitor.    02/05/22 0800  Psych Admission Type (Psych Patients Only)  Admission Status Voluntary  Psychosocial Assessment  Patient Complaints None  Eye Contact Brief  Facial Expression Anxious  Affect Appropriate to circumstance  Speech Logical/coherent  Interaction Assertive  Motor Activity Slow  Appearance/Hygiene Unremarkable  Behavior Characteristics Calm;Cooperative  Mood Anxious  Thought Process  Coherency WDL  Content WDL  Delusions None reported or observed  Perception WDL  Hallucination None reported or observed  Judgment Impaired  Confusion None  Danger to Self  Current suicidal ideation? Denies  Self-Injurious Behavior No self-injurious ideation or behavior indicators observed or expressed   Agreement Not to Harm Self Yes  Description of Agreement verbal contract for safety  Danger to Others  Danger to Others None reported or observed  Danger to Others Abnormal  Harmful Behavior to others No threats or harm toward other people  Destructive Behavior No threats or harm toward property

## 2022-02-05 NOTE — Progress Notes (Signed)
  D: Bedtime CBG 93. Patient alert and oriented x 4, cooperative with care. Patient denies SI,HI, & AVH. Patient rated her depression 4/10 and anxiety 4/10. Patient reports sleeping a lot in the morning and daytime. Patient stated she is concerned about her anger issues, "I get mad, and I don't understand how to handle that." When this RN asked about her coping skills, she replied "walking and listening to music helped me calm down." Patient reports bilateral lower extremities pain, and rated pain 6/10.   A: Nurse provided support and encouraged the use of coping skills to deal with anger issues. Scheduled medications administered per MAR, PRN Ibuprofen administered for lower extremities pain. Patient verbally contracted for safety, Q 15 minutes safety checks ongoing.  R: Patient tolerated medications without noted side effects. No signs and symptoms of hypoglycemia/hyperglycemia reported. Patient remains safe on the unit at this time, Q 15 minutes safety checks maintained.

## 2022-02-05 NOTE — Progress Notes (Signed)
Patient attended the evening N.A.meeting and was appropriate.  

## 2022-02-05 NOTE — Group Note (Signed)
Recreation Therapy Group Note   Group Topic:Problem Solving  Group Date: 02/05/2022 Start Time: 0930 End Time: 1002 Facilitators: Coleby Yett-McCall, LRT,CTRS Location: [address] Dayroom   Goal Area(s) Addresses:  Patient will effectively work with peer towards shared goal.  Patient will identify skills used to make activity successful.  Patient will identify how skills used during activity can be used to reach post d/c goals.   Group Description: Landing Pad. In teams of 3-5, patients were given 12 plastic drinking straws and an equal length of masking tape. Using the materials provided, patients were asked to build a landing pad to catch a golf ball dropped from approximately 5 feet in the air. All materials were required to be used by the team in their design. LRT facilitated post-activity discussion.   Affect/Mood: Appropriate   Participation Level: Engaged   Participation Quality: Independent   Behavior: Appropriate   Speech/Thought Process: Focused   Insight: Good   Judgement: Good   Modes of Intervention: STEM Activity   Patient Response to Interventions:  Engaged   Education Outcome:  Acknowledges education and In group clarification offered    Clinical Observations/Individualized Feedback: Pt worked with peers in coming up with concepts for the landing pad.  Pt was energetic and attentive during group.    Plan: Continue to engage patient in RT group sessions 2-3x/week.   Ioana Louks-McCall, LRT,CTRS 02/05/2022 11:08 AM

## 2022-02-05 NOTE — BH IP Treatment Plan (Signed)
Interdisciplinary Treatment and Diagnostic Plan Update  02/05/2022 Time of Session: 10:05am  Sara [name] MRN: [id number]  Principal Diagnosis: MDD (major depressive disorder), recurrent episode, severe ([place])  Secondary Diagnoses: Principal Problem:   MDD (major depressive disorder), recurrent episode, severe ([place])   Current Medications:  Current Facility-Administered Medications  Medication Dose Route Frequency Provider Last Rate Last Admin   albuterol (VENTOLIN HFA) 108 (90 Base) MCG/ACT inhaler 2 puff  2 puff Inhalation Q6H PRN Onuoha, Chinwendu V, NP   2 puff at 02/04/22 0757   alum & mag hydroxide-simeth (MAALOX/MYLANTA) 200-200-20 MG/5ML suspension 30 mL  30 mL Oral Q4H PRN Lucky Rathke, FNP       ARIPiprazole (ABILIFY) tablet 15 mg  15 mg Oral QHS Ntuen, Kris Hartmann, FNP   15 mg at 02/03/22 2107   docusate sodium (COLACE) capsule 100 mg  100 mg Oral BID Leevy-Johnson, Brooke A, NP   100 mg at 02/05/22 0802   FLUoxetine (PROZAC) capsule 20 mg  20 mg Oral Daily Ntuen, Kris Hartmann, FNP   20 mg at 02/05/22 0802   gabapentin (NEURONTIN) capsule 400 mg  400 mg Oral TID Laretta Bolster, FNP   400 mg at 02/05/22 1236   hydrOXYzine (ATARAX) tablet 25 mg  25 mg Oral TID PRN Lucky Rathke, FNP   25 mg at 02/03/22 1127   ibuprofen (ADVIL) tablet 400 mg  400 mg Oral Q6H PRN Laretta Bolster, FNP   400 mg at 02/05/22 0803   insulin aspart (novoLOG) injection 0-15 Units  0-15 Units Subcutaneous TID WC Leevy-Johnson, Brooke A, NP   5 Units at 02/05/22 1238   insulin aspart (novoLOG) injection 0-5 Units  0-5 Units Subcutaneous QHS Onuoha, Chinwendu V, NP   5 Units at 01/31/22 2150   insulin aspart (novoLOG) injection 5 Units  5 Units Subcutaneous TID WC Leevy-Johnson, Brooke A, NP   5 Units at 02/05/22 1237   insulin glargine-yfgn (SEMGLEE) injection 18 Units  18 Units Subcutaneous Daily Leevy-Johnson, Brooke A, NP   18 Units at 02/05/22 0930   lisinopril (ZESTRIL) tablet 10 mg  10 mg Oral Daily Ntuen, Kris Hartmann,  FNP   10 mg at 02/04/22 0749   risperiDONE (RISPERDAL M-TABS) disintegrating tablet 2 mg  2 mg Oral Q8H PRN Ntuen, Kris Hartmann, FNP       And   LORazepam (ATIVAN) tablet 1 mg  1 mg Oral PRN Ntuen, Kris Hartmann, FNP       And   ziprasidone (GEODON) injection 20 mg  20 mg Intramuscular PRN Ntuen, Kris Hartmann, FNP       magnesium hydroxide (MILK OF MAGNESIA) suspension 30 mL  30 mL Oral Daily PRN Lucky Rathke, FNP       nicotine polacrilex (NICORETTE) gum 2 mg  2 mg Oral PRN Evette Georges, NP   2 mg at 02/04/22 0751   ondansetron (ZOFRAN-ODT) disintegrating tablet 4 mg  4 mg Oral Q8H PRN Ntuen, Kris Hartmann, FNP       pantoprazole (PROTONIX) EC tablet 40 mg  40 mg Oral Daily Leevy-Johnson, Brooke A, NP   40 mg at 02/05/22 0802   senna (SENOKOT) tablet 8.6 mg  1 tablet Oral Daily PRN Leevy-Johnson, Brooke A, NP   8.6 mg at 02/01/22 2112   traZODone (DESYREL) tablet 100 mg  100 mg Oral QHS PRN Laretta Bolster, FNP   100 mg at 02/04/22 2223   PTA Medications: Medications Prior to Admission  Medication  Sig Dispense Refill Last Dose   albuterol (VENTOLIN HFA) 108 (90 Base) MCG/ACT inhaler Inhale 1-2 puffs into the lungs every 6 (six) hours as needed for wheezing.   Past Month   gabapentin (NEURONTIN) 300 MG capsule Take 600 mg by mouth 3 (three) times daily.   Past Week   Insulin Glargine w/ Trans Port 100 UNIT/ML SOPN Inject 14 Units into the skin daily.   Past Week   insulin lispro (HUMALOG) 100 UNIT/ML injection Inject 1-10 Units into the skin 3 (three) times daily as needed for high blood sugar (sliding scale has paper at home does not know dosing).   Past Month   Naproxen Sodium (ALEVE) 220 MG CAPS Take 1 capsule by mouth daily. Take 2 caps 4 times daily as needed.   Past Week   omeprazole (PRILOSEC) 40 MG capsule Take 40 mg by mouth daily.   Past Week   ondansetron (ZOFRAN-ODT) 4 MG disintegrating tablet Take 4 mg by mouth every 6 (six) hours as needed for nausea.   Past Month   oxyCODONE-acetaminophen  (PERCOCET/ROXICET) 5-325 MG tablet Take 1 tablet by mouth every 4 (four) hours as needed for moderate pain.   Past Month    Patient Stressors: Financial difficulties   Occupational concerns    Patient Strengths: Motivation for treatment/growth   Treatment Modalities: Medication Management, Group therapy, Case management,  1 to 1 session with clinician, Psychoeducation, Recreational therapy.   Physician Treatment Plan for Primary Diagnosis: MDD (major depressive disorder), recurrent episode, severe (HCC) Long Term Goal(s): Improvement in symptoms so as ready for discharge   Short Term Goals: Ability to identify changes in lifestyle to reduce recurrence of condition will improve Ability to verbalize feelings will improve Ability to disclose and discuss suicidal ideas Ability to demonstrate self-control will improve Ability to identify and develop effective coping behaviors will improve Ability to maintain clinical measurements within normal limits will improve Compliance with prescribed medications will improve Ability to identify triggers associated with substance abuse/mental health issues will improve  Medication Management: Evaluate patient's response, side effects, and tolerance of medication regimen.  Therapeutic Interventions: 1 to 1 sessions, Unit Group sessions and Medication administration.  Evaluation of Outcomes: Progressing  Physician Treatment Plan for Secondary Diagnosis: Principal Problem:   MDD (major depressive disorder), recurrent episode, severe (HCC)  Long Term Goal(s): Improvement in symptoms so as ready for discharge   Short Term Goals: Ability to identify changes in lifestyle to reduce recurrence of condition will improve Ability to verbalize feelings will improve Ability to disclose and discuss suicidal ideas Ability to demonstrate self-control will improve Ability to identify and develop effective coping behaviors will improve Ability to maintain  clinical measurements within normal limits will improve Compliance with prescribed medications will improve Ability to identify triggers associated with substance abuse/mental health issues will improve     Medication Management: Evaluate patient's response, side effects, and tolerance of medication regimen.  Therapeutic Interventions: 1 to 1 sessions, Unit Group sessions and Medication administration.  Evaluation of Outcomes: Progressing   RN Treatment Plan for Primary Diagnosis: MDD (major depressive disorder), recurrent episode, severe (HCC) Long Term Goal(s): Knowledge of disease and therapeutic regimen to maintain health will improve  Short Term Goals: Ability to remain free from injury will improve, Ability to participate in decision making will improve, Ability to verbalize feelings will improve, Ability to disclose and discuss suicidal ideas, and Ability to identify and develop effective coping behaviors will improve  Medication Management: RN will administer medications  as ordered by provider, will assess and evaluate patient's response and provide education to patient for prescribed medication. RN will report any adverse and/or side effects to prescribing provider.  Therapeutic Interventions: 1 on 1 counseling sessions, Psychoeducation, Medication administration, Evaluate responses to treatment, Monitor vital signs and CBGs as ordered, Perform/monitor CIWA, COWS, AIMS and Fall Risk screenings as ordered, Perform wound care treatments as ordered.  Evaluation of Outcomes: Progressing   LCSW Treatment Plan for Primary Diagnosis: MDD (major depressive disorder), recurrent episode, severe ([place]) Long Term Goal(s): Safe transition to appropriate next level of care at discharge, Engage patient in therapeutic group addressing interpersonal concerns.  Short Term Goals: Engage patient in aftercare planning with referrals and resources, Increase social support, Increase emotional regulation,  Facilitate acceptance of mental health diagnosis and concerns, Identify triggers associated with mental health/substance abuse issues, and Increase skills for wellness and recovery  Therapeutic Interventions: Assess for all discharge needs, 1 to 1 time with Social worker, Explore available resources and support systems, Assess for adequacy in community support network, Educate family and significant other(s) on suicide prevention, Complete Psychosocial Assessment, Interpersonal group therapy.  Evaluation of Outcomes: Progressing   Progress in Treatment: Attending groups: Yes. Participating in groups: Yes. Taking medication as prescribed: No. Toleration medication: No. Family/Significant other contact made: Yes, individual(s) contacted:  (Son) Sara [name] [phone] Patient understands diagnosis: No. Discussing patient identified problems/goals with staff: Yes. Medical problems stabilized or resolved: Yes. Denies suicidal/homicidal ideation: Yes. Issues/concerns per patient self-inventory: No.    New problem(s) identified: No, Describe:  None    New Short Term/Long Term Goal(s): medication stabilization, elimination of SI thoughts, development of comprehensive mental wellness plan.    Patient Goals:  "Coping skills and housing"    Discharge Plan or Barriers: Patient recently admitted. CSW will continue to follow and assess for appropriate referrals and possible discharge planning.    Reason for Continuation of Hospitalization: Anxiety Depression Medication stabilization Suicidal ideation   Estimated Length of Stay: 3 to 7 days   Last [place] Suicide Severity Risk Score: [place] Admission (Current) from 01/29/2022 in [facility] ED from 03/06/2020 in [facility] High Risk No Risk       Last PHQ 2/9 Scores:     No data to display          Scribe for Treatment Team: Carney Harder 02/05/2022 2:33 PM

## 2022-02-06 LAB — GLUCOSE, CAPILLARY
Glucose-Capillary: 182 mg/dL — ABNORMAL HIGH (ref 70–99)
Glucose-Capillary: 151 mg/dL — ABNORMAL HIGH (ref 70–99)
Glucose-Capillary: 211 mg/dL — ABNORMAL HIGH (ref 70–99)

## 2022-02-06 NOTE — NC FL2 (Deleted)
[place] LEVEL OF CARE FORM     IDENTIFICATION  Patient Name: Sara [name] Birthdate: 02-Jan-1976 Sex: female Admission Date (Current Location): 01/29/2022  [place] and [place] Number:  Kathleen Argue [id number] [place] and Address:  The [place]. [facility], [place] [address], [place], [place] [address] [facility] [place], [place])      Provider Number: [id number]  Attending Physician Name and Address:  Dian Situ, MD  Relative Name and Phone Number:  Lameshia Hypolite [phone]    Current Level of Care: Other (Comment) (Homeless Shelter) Recommended Level of Care: Other (Comment) (Group Home) Prior Approval Number:    Date Approved/Denied:   PASRR Number:    Discharge Plan: Other (Comment) (Group Home Placement)    Current Diagnoses: Patient Active Problem List   Diagnosis Date Noted   MDD (major depressive disorder), recurrent episode, severe ([place]) 95/63/8756   Helicobacter pylori gastritis 11/28/2013   Nausea with vomiting 08/05/2013   Abdominal pain, unspecified site 08/05/2013    Orientation RESPIRATION BLADDER Height & Weight     Self, Time, Situation, Place      Weight: 67.1 kg Height:  5' 4.5" (163.8 cm)  BEHAVIORAL SYMPTOMS/MOOD NEUROLOGICAL BOWEL NUTRITION STATUS        Diet (Diabetic)  AMBULATORY STATUS COMMUNICATION OF NEEDS Skin   Independent                           Personal Care Assistance Level of Assistance    Bathing Assistance: Independent Feeding assistance: Independent       Functional Limitations Info             SPECIAL CARE FACTORS FREQUENCY                       Contractures      Additional Factors Info  Insulin Sliding Scale               Current Medications (02/06/2022):  This is the current hospital active medication list Current Facility-Administered Medications  Medication Dose Route Frequency Provider Last Rate Last Admin    albuterol (VENTOLIN HFA) 108 (90 Base) MCG/ACT inhaler 2 puff  2 puff Inhalation Q6H PRN Onuoha, Chinwendu V, NP   2 puff at 02/06/22 0825   alum & mag hydroxide-simeth (MAALOX/MYLANTA) 200-200-20 MG/5ML suspension 30 mL  30 mL Oral Q4H PRN Lucky Rathke, FNP       ARIPiprazole (ABILIFY) tablet 15 mg  15 mg Oral QHS Ntuen, Tina C, FNP   15 mg at 02/05/22 2155   docusate sodium (COLACE) capsule 100 mg  100 mg Oral BID Leevy-Johnson, Brooke A, NP   100 mg at 02/06/22 0827   FLUoxetine (PROZAC) capsule 20 mg  20 mg Oral Daily Ntuen, Tina C, FNP   20 mg at 02/06/22 0827   gabapentin (NEURONTIN) capsule 400 mg  400 mg Oral TID Laretta Bolster, FNP   400 mg at 02/06/22 0826   hydrOXYzine (ATARAX) tablet 25 mg  25 mg Oral TID PRN Lucky Rathke, FNP   25 mg at 02/05/22 2157   ibuprofen (ADVIL) tablet 400 mg  400 mg Oral Q6H PRN Laretta Bolster, FNP   400 mg at 02/06/22 0833   insulin aspart (novoLOG) injection 0-15 Units  0-15 Units Subcutaneous TID WC Leevy-Johnson, Brooke A, NP   3 Units at 02/06/22 0716   insulin aspart (  novoLOG) injection 0-5 Units  0-5 Units Subcutaneous QHS Onuoha, Chinwendu V, NP   5 Units at 01/31/22 2150   insulin aspart (novoLOG) injection 5 Units  5 Units Subcutaneous TID WC Leevy-Johnson, Brooke A, NP   5 Units at 02/06/22 0732   insulin glargine-yfgn (SEMGLEE) injection 18 Units  18 Units Subcutaneous Daily Leevy-Johnson, Brooke A, NP   18 Units at 02/06/22 0831   lisinopril (ZESTRIL) tablet 10 mg  10 mg Oral Daily Ntuen, Kris Hartmann, FNP   10 mg at 02/04/22 0749   risperiDONE (RISPERDAL M-TABS) disintegrating tablet 2 mg  2 mg Oral Q8H PRN Ntuen, Kris Hartmann, FNP       And   LORazepam (ATIVAN) tablet 1 mg  1 mg Oral PRN Ntuen, Kris Hartmann, FNP       And   ziprasidone (GEODON) injection 20 mg  20 mg Intramuscular PRN Ntuen, Kris Hartmann, FNP       magnesium hydroxide (MILK OF MAGNESIA) suspension 30 mL  30 mL Oral Daily PRN Lucky Rathke, FNP       nicotine polacrilex (NICORETTE) gum 2 mg  2 mg Oral  PRN Evette Georges, NP   2 mg at 02/04/22 0751   ondansetron (ZOFRAN-ODT) disintegrating tablet 4 mg  4 mg Oral Q8H PRN Ntuen, Kris Hartmann, FNP       pantoprazole (PROTONIX) EC tablet 40 mg  40 mg Oral Daily Leevy-Johnson, Brooke A, NP   40 mg at 02/06/22 5681   senna (SENOKOT) tablet 8.6 mg  1 tablet Oral Daily PRN Leevy-Johnson, Brooke A, NP   8.6 mg at 02/01/22 2112   traZODone (DESYREL) tablet 100 mg  100 mg Oral QHS PRN Laretta Bolster, FNP   100 mg at 02/05/22 2156     Discharge Medications: Please see discharge summary for a list of discharge medications.  Relevant Imaging Results:  Relevant Lab Results:   Additional Information    Hansford Hirt S Nayah Lukens, LCSW

## 2022-02-06 NOTE — [facility] Group Notes (Signed)
Adult Psychoeducational Group Note  Date:  02/06/2022 Time:  5:24 PM  Group Topic/Focus:  Managing Feelings:   The focus of this group is to identify what feelings patients have difficulty handling and develop a plan to handle them in a healthier way upon discharge.  Participation Level:  Did Not Attend  Participation Quality:  did not attend  Affect:   did not attend  Cognitive:  did not attend  Insight: None  Engagement in Group:   did not attend  Modes of Intervention:  did not attend  Additional Comments:  did not attend  Anne-Marie Genson R Inaki Vantine 02/06/2022, 5:24 PM

## 2022-02-06 NOTE — Progress Notes (Signed)
[place] Group Notes:  (Nursing/MHT/Case Management/Adjunct)  Date:  02/06/2022  Time:  2015  Type of Therapy:   wrap up group  Participation Level:  Active  Participation Quality:  Appropriate, Attentive, Sharing, and Supportive  Affect:  Appropriate  Cognitive:  Alert  Insight:  Improving  Engagement in Group:  Engaged  Modes of Intervention:  Clarification, Education, and Support  Summary of Progress/Problems: Positive thinking and positive change were discussed.  Shellia Cleverly 02/06/2022, 9:43 PM

## 2022-02-06 NOTE — Plan of Care (Signed)
  Problem: Coping: Goal: Ability to adjust to condition or change in health will improve Outcome: Progressing   Problem: Metabolic: Goal: Ability to maintain appropriate glucose levels will improve Outcome: Progressing   Problem: Education: Goal: Knowledge of the prescribed therapeutic regimen will improve Outcome: Progressing   Problem: Coping: Goal: Coping ability will improve Outcome: Progressing   Problem: Health Behavior/Discharge Planning: Goal: Ability to make decisions will improve Outcome: Progressing   Problem: Safety: Goal: Periods of time without injury will increase Outcome: Progressing

## 2022-02-06 NOTE — [place] FL2 (Signed)
[place] MEDICAID FL2 LEVEL OF CARE FORM     IDENTIFICATION  Patient Name: Sara [name] Birthdate: 12/31/1975 Sex: female Admission Date (Current Location): 01/29/2022  County and Medicaid Number:  [place] [id number] Facility and Address:  The [place]. [facility], [address], [place], [place] [address] ([facility] [address] [place], [place])      Provider Number: [id number]  Attending Physician Name and Address:  Attiah, Nadir, MD  Relative Name and Phone Number:  Kahlil Basher [phone]    Current Level of Care: Other (Comment) (Homeless Shelter) Recommended Level of Care: Other (Comment) (Group Home) Prior Approval Number:    Date Approved/Denied:   PASRR Number:    Discharge Plan: Other (Comment) (Group Home Placement)    Current Diagnoses: Patient Active Problem List   Diagnosis Date Noted   MDD (major depressive disorder), recurrent episode, severe (HCC) 01/29/2022   Helicobacter pylori gastritis 11/28/2013   Nausea with vomiting 08/05/2013   Abdominal pain, unspecified site 08/05/2013    Orientation RESPIRATION BLADDER Height & Weight     Self, Time, Situation, Place      Weight: 67.1 kg Height:  5' 4.5" (163.8 cm)  BEHAVIORAL SYMPTOMS/MOOD NEUROLOGICAL BOWEL NUTRITION STATUS        Diet (Diabetic)  AMBULATORY STATUS COMMUNICATION OF NEEDS Skin   Independent                           Personal Care Assistance Level of Assistance    Bathing Assistance: Independent Feeding assistance: Independent       Functional Limitations Info             SPECIAL CARE FACTORS FREQUENCY                       Contractures      Additional Factors Info  Insulin Sliding Scale               Current Medications (02/06/2022):  This is the current hospital active medication list Current Facility-Administered Medications  Medication Dose Route Frequency Provider Last Rate Last Admin    albuterol (VENTOLIN HFA) 108 (90 Base) MCG/ACT inhaler 2 puff  2 puff Inhalation Q6H PRN Onuoha, Chinwendu V, NP   2 puff at 02/06/22 0825   alum & mag hydroxide-simeth (MAALOX/MYLANTA) 200-200-20 MG/5ML suspension 30 mL  30 mL Oral Q4H PRN Allen, Tina L, FNP       ARIPiprazole (ABILIFY) tablet 15 mg  15 mg Oral QHS Ntuen, Tina C, FNP   15 mg at 02/05/22 2155   docusate sodium (COLACE) capsule 100 mg  100 mg Oral BID Leevy-Johnson, Brooke A, NP   100 mg at 02/06/22 0827   FLUoxetine (PROZAC) capsule 20 mg  20 mg Oral Daily Ntuen, Tina C, FNP   20 mg at 02/06/22 0827   gabapentin (NEURONTIN) capsule 400 mg  400 mg Oral TID Ntuen, Tina C, FNP   400 mg at 02/06/22 0826   hydrOXYzine (ATARAX) tablet 25 mg  25 mg Oral TID PRN Allen, Tina L, FNP   25 mg at 02/05/22 2157   ibuprofen (ADVIL) tablet 400 mg  400 mg Oral Q6H PRN Ntuen, Tina C, FNP   400 mg at 02/06/22 0833   insulin aspart (novoLOG) injection 0-15 Units  0-15 Units Subcutaneous TID WC Leevy-Johnson, Brooke A, NP   3 Units at 02/06/22 0716   insulin aspart (  novoLOG) injection 0-5 Units  0-5 Units Subcutaneous QHS Onuoha, Chinwendu V, NP   5 Units at 01/31/22 2150   insulin aspart (novoLOG) injection 5 Units  5 Units Subcutaneous TID WC Leevy-Johnson, Brooke A, NP   5 Units at 02/06/22 0732   insulin glargine-yfgn (SEMGLEE) injection 18 Units  18 Units Subcutaneous Daily Leevy-Johnson, Brooke A, NP   18 Units at 02/06/22 0831   lisinopril (ZESTRIL) tablet 10 mg  10 mg Oral Daily Ntuen, Kris Hartmann, FNP   10 mg at 02/04/22 0749   risperiDONE (RISPERDAL M-TABS) disintegrating tablet 2 mg  2 mg Oral Q8H PRN Ntuen, Kris Hartmann, FNP       And   LORazepam (ATIVAN) tablet 1 mg  1 mg Oral PRN Ntuen, Kris Hartmann, FNP       And   ziprasidone (GEODON) injection 20 mg  20 mg Intramuscular PRN Ntuen, Kris Hartmann, FNP       magnesium hydroxide (MILK OF MAGNESIA) suspension 30 mL  30 mL Oral Daily PRN Lucky Rathke, FNP       nicotine polacrilex (NICORETTE) gum 2 mg  2 mg Oral  PRN Evette Georges, NP   2 mg at 02/04/22 0751   ondansetron (ZOFRAN-ODT) disintegrating tablet 4 mg  4 mg Oral Q8H PRN Ntuen, Kris Hartmann, FNP       pantoprazole (PROTONIX) EC tablet 40 mg  40 mg Oral Daily Leevy-Johnson, Brooke A, NP   40 mg at 02/06/22 5681   senna (SENOKOT) tablet 8.6 mg  1 tablet Oral Daily PRN Leevy-Johnson, Brooke A, NP   8.6 mg at 02/01/22 2112   traZODone (DESYREL) tablet 100 mg  100 mg Oral QHS PRN Laretta Bolster, FNP   100 mg at 02/05/22 2156     Discharge Medications: Please see discharge summary for a list of discharge medications.  Relevant Imaging Results:  Relevant Lab Results:   Additional Information    Makyia Erxleben S Zaiya Annunziato, LCSW

## 2022-02-06 NOTE — Group Note (Signed)
LCSW Group Therapy Note   Group Date: 02/06/2022 Start Time: 1100 End Time: 1200   Type of Therapy and Topic:  Group Therapy: Boundaries  Participation Level:  Active  Description of Group: This group will address the use of boundaries in their personal lives. Patients will explore why boundaries are important, the difference between healthy and unhealthy boundaries, and negative and postive outcomes of different boundaries and will look at how boundaries can be crossed.  Patients will be encouraged to identify current boundaries in their own lives and identify what kind of boundary is being set. Facilitators will guide patients in utilizing problem-solving interventions to address and correct types boundaries being used and to address when no boundary is being used. Understanding and applying boundaries will be explored and addressed for obtaining and maintaining a balanced life. Patients will be encouraged to explore ways to assertively make their boundaries and needs known to significant others in their lives, using other group members and facilitator for role play, support, and feedback.  Therapeutic Goals:  1.  Patient will identify areas in their life where setting clear boundaries could be  used to improve their life.  2.  Patient will identify signs/triggers that a boundary is not being respected. 3.  Patient will identify two ways to set boundaries in order to achieve balance in  their lives: 4.  Patient will demonstrate ability to communicate their needs and set boundaries  through discussion and/or role plays  Summary of Patient Progress:  Sara [name] was present/active throughout the session and proved open to feedback from [place] and peers. Patient demonstrated good insight into the subject matter, was respectful of peers, and was present throughout the entire session.  Therapeutic Modalities:   Cognitive Behavioral Therapy Solution-Focused Therapy  Marval Regal Nikolay Demetriou, LCSW 02/06/2022  1:30  PM

## 2022-02-06 NOTE — Progress Notes (Signed)
   02/06/22 0847  Psych Admission Type (Psych Patients Only)  Admission Status Voluntary  Psychosocial Assessment  Patient Complaints Anxiety  Eye Contact Brief  Facial Expression Anxious  Affect Appropriate to circumstance  Speech Logical/coherent  Interaction Assertive  Motor Activity Slow  Appearance/Hygiene Unremarkable  Behavior Characteristics Cooperative  Mood Anxious  Thought Process  Coherency WDL  Content WDL  Delusions None reported or observed  Perception WDL  Hallucination None reported or observed  Judgment WDL  Confusion None  Danger to Self  Current suicidal ideation? Denies  Danger to Others  Danger to Others None reported or observed

## 2022-02-06 NOTE — Group Note (Signed)
LCSW Group Therapy Note   Group Date: 02/06/2022 Start Time: 1100 End Time: 1200   Type of Therapy and Topic:  Group Therapy: Boundaries  Participation Level:  Active  Description of Group: This group will address the use of boundaries in their personal lives. Patients will explore why boundaries are important, the difference between healthy and unhealthy boundaries, and negative and postive outcomes of different boundaries and will look at how boundaries can be crossed.  Patients will be encouraged to identify current boundaries in their own lives and identify what kind of boundary is being set. Facilitators will guide patients in utilizing problem-solving interventions to address and correct types boundaries being used and to address when no boundary is being used. Understanding and applying boundaries will be explored and addressed for obtaining and maintaining a balanced life. Patients will be encouraged to explore ways to assertively make their boundaries and needs known to significant others in their lives, using other group members and facilitator for role play, support, and feedback.  Therapeutic Goals:  1.  Patient will identify areas in their life where setting clear boundaries could be  used to improve their life.  2.  Patient will identify signs/triggers that a boundary is not being respected. 3.  Patient will identify two ways to set boundaries in order to achieve balance in  their lives: 4.  Patient will demonstrate ability to communicate their needs and set boundaries  through discussion and/or role plays  Summary of Patient Progress:  Elianne was present/active throughout the session and proved open to feedback from [place] and peers. Patient demonstrated Great insight into the subject matter, was respectful of peers, and was present throughout the entire session.  Therapeutic Modalities:   Cognitive Behavioral Therapy Solution-Focused Therapy  Marval Regal Mio Schellinger, LCSW 02/06/2022  1:22  PM

## 2022-02-06 NOTE — Progress Notes (Signed)
   02/06/22 0544  15 Minute Checks  Location Bedroom  Visual Appearance Calm  Behavior Sleeping  Sleep (Behavioral Health Patients Only)  Calculate sleep? (Click Yes once per 24 hr at 0600 safety check) Yes  Documented sleep last 24 hours 7.75

## 2022-02-06 NOTE — Progress Notes (Signed)
[facility] MD Progress Note  02/06/2022 3:25 PM Sara [name]  MRN:  [id number]  Principal Problem: MDD (major depressive disorder), recurrent episode, severe (HCC) Diagnosis: Principal Problem:   MDD (major depressive disorder), recurrent episode, severe (HCC)  HPI: Sara [name] is a 47 year old African American female with a past psychiatric diagnoses of schizoaffective disorder, bipolar, and major depressive disorder recurrent severe who presented voluntarily to [facility] 01/29/22 from [facility] ED for worsening depression and suicidal thoughts. Patient noted to have multiple medical co-morbidities including diabetes, sickle cell trait, hypertension, hyperlipidemia mixed. On initial assessment patient reported increased stressors related to homelessness, substance abuse (cocaine and marijuana), family issues (recently asked to leave son's home), and increased depression with suicidal ideations where she reports intentionally stopped eating and taking her insulin and eating to induce diabetic coma for suicide. She reports significant weight-loss as a result.   24 hr chart review:  Received PRNs' Albuterol this morning for wheezing and shortness of breath, ibuprofen requested last night and this morning for moderate back pain, hydroxyzine last night and trazodone last night for sleep. Patient remains on carb modified diet for her diabetes and monitored by diabetic educator. Patient noncompliant with insulin orders,CBG 151. Patient reports sleeping over 8 hours hour last night.  Blood pressure 99/73 to 112/84 lisinopril not given this morning, pulse rate of 103 to 112.  Increase p.o. fluid intake encouraged, and nursing staff to recheck vital signs.  Nursing staff to hold trazodone tonight systolic blood pressure is below 100.   Assessment: Patient seen and examined on [address] sitting on her bed.  Discussed discharge planning regarding her accommodation with patient and the Child psychotherapist.  Patient aware  of discharge date of tomorrow 02/07/2022 to [facility] shelter. Her hygiene and appearance is fairly groomed. As per recreation therapy group notes, patient actively participated and engaged in the setting boundaries discussion.  Alert and oriented x 4. She reports sleeping through the night and being restful. Reports mood is less depressed today. Continues to contract for safety.  As per nursing report, patient noncompliant with insulin therapy, CBG 151.  Blood pressure low at 99/73, lisinopril held this morning.  No paranoia or delusional thinking observed during assessment. Supportive therapy provided on ongoing stressors, and encouraged patient to stick to her low carbohydrate diet and insulin therapy.  Inpatient Diabetes Program Recommendations:  Diabetes history: DM2 Outpatient Diabetes medications: Lantus 14 units daily, Humalog 1-10 units TID with meals Current orders for Inpatient glycemic control: Semglee 18 units daily, Novolog 0-15 units TID with meals, Novolog 0-5 units QHS   Inpatient Diabetes Program Recommendations:     Insulin: Please consider increasing Semglee 18 units daily to start 02/03/22 (already given Semglee 10 units today; so order Semglee 8 units x1 now) and ordering Novolog 5 units TID with meals for meal coverage if patient eats at least 50% of meals  Total Time spent with patient: 30 minutes   Past Medical History:  Past Medical History:  Diagnosis Date   Acid reflux    Chronic pain    Depression    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 pylori infection    High risk medication use    Hypercholesterolemia    Hyperlipidemia, mixed    Hypertension    Other mixed anxiety disorders    Pollen allergies    Sickle cell trait (HCC)  Sleep apnea    Vitamin D deficiency     Past Surgical History:  Procedure Laterality Date   ABDOMINAL HYSTERECTOMY     APPENDECTOMY     COLONOSCOPY   08-16-2008   Dr. Vallarie Mare   ESOPHAGOGASTRODUODENOSCOPY  08-16-2008   Dr. Rayfield Citizen    Family History:  Family History  Problem Relation Age of Onset   Clotting disorder Mother    Crohn's disease Mother    Heart disease Mother    Colon cancer Father    Clotting disorder Brother    Diabetes Brother    Diabetes Maternal Aunt    Liver cancer Maternal Uncle    Prostate cancer Maternal Uncle    Colon cancer Maternal Uncle    Diabetes Paternal Aunt    Esophageal cancer Paternal Uncle    Family Psychiatric History: not noted Social History:  Social History   Substance and Sexual Activity  Alcohol Use None     Social History   Substance and Sexual Activity  Drug Use Yes   Types: Marijuana    Social History   Socioeconomic History   Marital status: Married    Spouse name: Not on file   Number of children: 2   Years of education: Not on file   Highest education level: Not on file  Occupational History   Occupation: n/a  Tobacco Use   Smoking status: Every Day    Packs/day: 1.00    Types: Cigarettes   Smokeless tobacco: Never  Substance and Sexual Activity   Alcohol use: Not on file   Drug use: Yes    Types: Marijuana   Sexual activity: Not on file  Other Topics Concern   Not on file  Social History Narrative   Not on file   Social Determinants of Health   Financial Resource Strain: Not on file  Food Insecurity: Food Insecurity Present (01/30/2022)   Hunger Vital Sign    Worried About Running Out of Food in the Last Year: Often true    Ran Out of Food in the Last Year: Often true  Transportation Needs: Unknown (01/30/2022)   PRAPARE - Administrator, Civil Service (Medical): Patient refused    Lack of Transportation (Non-Medical): Patient refused  Physical Activity: Not on file  Stress: Not on file  Social Connections: Not on file   Additional Social History:   Sleep: Fair  Appetite:  Good  Current Medications: Current  Facility-Administered Medications  Medication Dose Route Frequency Provider Last Rate Last Admin   albuterol (VENTOLIN HFA) 108 (90 Base) MCG/ACT inhaler 2 puff  2 puff Inhalation Q6H PRN Onuoha, Chinwendu V, NP   2 puff at 02/06/22 0825   alum & mag hydroxide-simeth (MAALOX/MYLANTA) 200-200-20 MG/5ML suspension 30 mL  30 mL Oral Q4H PRN Lenard Lance, FNP       ARIPiprazole (ABILIFY) tablet 15 mg  15 mg Oral QHS Lauralei Clouse C, FNP   15 mg at 02/05/22 2155   docusate sodium (COLACE) capsule 100 mg  100 mg Oral BID Leevy-Johnson, Brooke A, NP   100 mg at 02/06/22 0827   FLUoxetine (PROZAC) capsule 20 mg  20 mg Oral Daily Shellie Goettl C, FNP   20 mg at 02/06/22 0827   gabapentin (NEURONTIN) capsule 400 mg  400 mg Oral TID Cecilie Lowers, FNP   400 mg at 02/06/22 1201   hydrOXYzine (ATARAX) tablet 25 mg  25 mg Oral TID PRN Lenard Lance, FNP   25 mg  at 02/05/22 2157   ibuprofen (ADVIL) tablet 400 mg  400 mg Oral Q6H PRN Cecilie Lowers, FNP   400 mg at 02/06/22 0160   insulin aspart (novoLOG) injection 0-15 Units  0-15 Units Subcutaneous TID WC Leevy-Johnson, Brooke A, NP   3 Units at 02/06/22 1200   insulin aspart (novoLOG) injection 0-5 Units  0-5 Units Subcutaneous QHS Onuoha, Chinwendu V, NP   5 Units at 01/31/22 2150   insulin aspart (novoLOG) injection 5 Units  5 Units Subcutaneous TID WC Leevy-Johnson, Brooke A, NP   5 Units at 02/06/22 1200   insulin glargine-yfgn (SEMGLEE) injection 18 Units  18 Units Subcutaneous Daily Leevy-Johnson, Brooke A, NP   18 Units at 02/06/22 0831   lisinopril (ZESTRIL) tablet 10 mg  10 mg Oral Daily Travelle Mcclimans, Jesusita Oka, FNP   10 mg at 02/04/22 0749   risperiDONE (RISPERDAL M-TABS) disintegrating tablet 2 mg  2 mg Oral Q8H PRN Zarian Colpitts, Jesusita Oka, FNP       And   LORazepam (ATIVAN) tablet 1 mg  1 mg Oral PRN Jeremian Whitby, Jesusita Oka, FNP       And   ziprasidone (GEODON) injection 20 mg  20 mg Intramuscular PRN Dontavious Emily, Jesusita Oka, FNP       magnesium hydroxide (MILK OF MAGNESIA) suspension 30 mL   30 mL Oral Daily PRN Lenard Lance, FNP       nicotine polacrilex (NICORETTE) gum 2 mg  2 mg Oral PRN Sindy Guadeloupe, NP   2 mg at 02/04/22 0751   ondansetron (ZOFRAN-ODT) disintegrating tablet 4 mg  4 mg Oral Q8H PRN Yumi Insalaco, Jesusita Oka, FNP       pantoprazole (PROTONIX) EC tablet 40 mg  40 mg Oral Daily Leevy-Johnson, Brooke A, NP   40 mg at 02/06/22 1093   senna (SENOKOT) tablet 8.6 mg  1 tablet Oral Daily PRN Leevy-Johnson, Brooke A, NP   8.6 mg at 02/01/22 2112   traZODone (DESYREL) tablet 100 mg  100 mg Oral QHS PRN Cecilie Lowers, FNP   100 mg at 02/05/22 2156    Lab Results:  Results for orders placed or performed during the hospital encounter of 01/29/22 (from the past 48 hour(s))  Glucose, capillary     Status: Abnormal   Collection Time: 02/05/22  9:28 AM  Result Value Ref Range   Glucose-Capillary 293 (H) 70 - 99 mg/dL    Comment: Glucose reference range applies only to samples taken after fasting for at least 8 hours.  Glucose, capillary     Status: Abnormal   Collection Time: 02/05/22 11:53 AM  Result Value Ref Range   Glucose-Capillary 249 (H) 70 - 99 mg/dL    Comment: Glucose reference range applies only to samples taken after fasting for at least 8 hours.  Glucose, capillary     Status: Abnormal   Collection Time: 02/05/22  5:15 PM  Result Value Ref Range   Glucose-Capillary 185 (H) 70 - 99 mg/dL    Comment: Glucose reference range applies only to samples taken after fasting for at least 8 hours.   Comment 1 Notify RN    Comment 2 Document in Chart   Glucose, capillary     Status: None   Collection Time: 02/05/22  9:11 PM  Result Value Ref Range   Glucose-Capillary 93 70 - 99 mg/dL    Comment: Glucose reference range applies only to samples taken after fasting for at least 8 hours.  Glucose, capillary  Status: Abnormal   Collection Time: 02/06/22  6:33 AM  Result Value Ref Range   Glucose-Capillary 182 (H) 70 - 99 mg/dL    Comment: Glucose reference range applies only  to samples taken after fasting for at least 8 hours.  Glucose, capillary     Status: Abnormal   Collection Time: 02/06/22 12:03 PM  Result Value Ref Range   Glucose-Capillary 151 (H) 70 - 99 mg/dL    Comment: Glucose reference range applies only to samples taken after fasting for at least 8 hours.    Blood Alcohol level:  Lab Results  Component Value Date   [facility]  12/11/2009    <5        LOWEST DETECTABLE LIMIT FOR SERUM ALCOHOL IS 5 mg/dL FOR MEDICAL PURPOSES ONLY    Metabolic Disorder Labs: Lab Results  Component Value Date   HGBA1C 12.7 (H) 02/01/2022   MPG 318 02/01/2022   No results found for: "PROLACTIN" Lab Results  Component Value Date   CHOL 204 (H) 02/01/2022   TRIG 61 02/01/2022   HDL 61 02/01/2022   CHOLHDL 3.3 02/01/2022   VLDL 12 02/01/2022   LDLCALC 131 (H) 02/01/2022    Physical Findings: AIMS:  , ,  ,  ,    CIWA:    COWS:     Musculoskeletal: Strength & Muscle Tone: within normal limits Gait & Station: normal Patient leans: N/A  Psychiatric Specialty Exam:  Presentation  General Appearance:  Appropriate for Environment; Casual  Eye Contact: Good  Speech: Clear and Coherent; Normal Rate  Speech Volume: Normal  Handedness: Right  Mood and Affect  Mood: Anxious; Depressed  Affect: Appropriate; Congruent  Thought Process  Thought Processes: Coherent  Descriptions of Associations:Intact  Orientation:Full (Time, Place and Person)  Thought Content:Logical  History of Schizophrenia/Schizoaffective disorder:No data recorded Duration of Psychotic Symptoms:No data recorded Hallucinations:Hallucinations: None  Ideas of Reference:None  Suicidal Thoughts:Suicidal Thoughts: No SI Passive Intent and/or Plan: -- (Patient denies)  Homicidal Thoughts:Homicidal Thoughts: No  Sensorium  Memory: Immediate Good; Recent Good  Judgment: Good  Insight: Good  Executive Functions  Concentration: Good  Attention  Span: Good  Recall: Good  Fund of Knowledge: Fair  Language: Good  Psychomotor Activity  Psychomotor Activity: Psychomotor Activity: Normal  Assets  Assets: Communication Skills; Desire for Improvement; Physical Health  Sleep  Sleep: Sleep: Good Number of Hours of Sleep: 8  Physical Exam: Physical Exam Vitals and nursing note reviewed.  Constitutional:      Appearance: She is normal weight.  HENT:     Head: Normocephalic.     Nose: Nose normal.     Mouth/Throat:     Mouth: Mucous membranes are moist.     Pharynx: Oropharynx is clear.  Eyes:     Conjunctiva/sclera: Conjunctivae normal.     Pupils: Pupils are equal, round, and reactive to light.  Cardiovascular:     Rate and Rhythm: Tachycardia present.     Comments: Blood pressure 99/73, pulse 112.  Nursing staff to recheck vital signs. Pulmonary:     Effort: Pulmonary effort is normal.  Abdominal:     Palpations: Abdomen is soft.  Genitourinary:    Comments: Deferred Musculoskeletal:        General: Normal range of motion.     Cervical back: Normal range of motion.  Skin:    General: Skin is warm and dry.  Neurological:     Mental Status: She is alert and oriented to person, place, and time.  Psychiatric:        Attention and Perception: Attention and perception normal. She does not perceive auditory or visual hallucinations.        Mood and Affect: Mood normal. Affect is blunt.        Speech: Speech normal.        Behavior: Behavior normal. Behavior is cooperative.        Thought Content: Thought content is not paranoid or delusional. Thought content does not include homicidal or suicidal ideation. Thought content does not include homicidal or suicidal plan.        Cognition and Memory: Cognition and memory normal.        Judgment: Judgment normal.    Review of Systems  Constitutional:  Positive for weight loss.  HENT: Negative.    Respiratory: Negative.    Cardiovascular: Negative.        Blood  pressure 99/73, pulse 112.  Nursing staff to recheck vital signs.   Genitourinary: Negative.   Musculoskeletal: Negative.   Skin: Negative.        Tattoos to bilateral arms and chest  Neurological: Negative.   Endo/Heme/Allergies: Negative.   Psychiatric/Behavioral:  Positive for depression and substance abuse. The patient is nervous/anxious.   All other systems reviewed and are negative.  Blood pressure 99/73, pulse (!) 112, temperature 98 F (36.7 C), temperature source Oral, resp. rate 20, height 5' 4.5" (1.638 m), weight 67.1 kg, SpO2 99 %. Body mass index is 25.01 kg/m.  Treatment Plan Summary: Daily contact with patient to assess and evaluate symptoms and progress in treatment, Medication management, and Plan   PLAN:  Safety and Monitoring: Voluntary admission to inpatient psychiatric unit for safety, stabilization and treatment Daily contact with patient to assess and evaluate symptoms and progress in treatment Patient's case to be discussed in multi-disciplinary team meeting Observation Level : q15 minute checks Vital signs: q12 hours Precautions: suicide, but pt currently verbally contracts for safety on unit    Major depressive disorder: -- Fluoxetine 20 mg capsule p.o. daily   Schizoaffective disorder/bipolar disorder --Abilify 15 mg tablets 1 daily at bedtime   Anxiety --Hydroxyzine 25 mg 3 times daily as needed/anxiety --Gabapentin 300 mg caps 3 times daily daily   Insomnia -Trazodone 100 mg p.o. at bedtime as needed   GERD: -- Omeprazole 40 mg 1 p.o. daily  Constipation:  -- Colace 100 mg BID   Hypertension: -- Lisinopril 10 mg p.o. daily   PTSD: -- Prazosin 1 mg p.o. daily.  Discontinue today 02/05/2022  Diabetes Type 2:  CHANGE: -- Novolog 5 units SQ TID with meals -- Semglee 18 units SQ daily Continue: -- Novolog 0-15 units SQ TID with meals. --Novolog 0-5 units SQ daily at bedtime  Other PRN Medications -Acetaminophen 650 mg every 6 hrs as  needed/mild pain -Maalox 30 mL oral every 4 hrs as needed/digestion -Magnesium hydroxide 30 mL daily as needed/mild constipation --Zofran disintegrating tabs 4 mg 1 every 6 hours as needed for nausea   Discharge Planning: Social work and case management to assist with discharge planning and identification of hospital follow-up needs prior to discharge Estimated LOS: 5-7 days Discharge Concerns: Need to establish a safety plan; Medication compliance and effectiveness Discharge Goals: Return home with outpatient referrals for mental health follow-up including medication management/psychotherapy.  Laretta Bolster, FNP 02/06/2022, 3:25 PM Patient ID: Talbert Forest, female   DOB: 1975/09/30, 47 y.o.   MRN: [id number] Patient ID: ZAINA JENKIN, female   DOB: 08-17-1975, 47  y.o.   MRN: [id number] Patient ID: AIYSHA JILLSON, female   DOB: [birth year], 47 y.o.   MRN: [id number] Patient ID: CONNOR FOXWORTHY, female   DOB: 1975/07/29, 47 y.o.   MRN: [id number] Patient ID: KARSTEN VAUGHN, female   DOB: 04/19/1975, 46 y.o.   MRN: [id number]

## 2022-02-06 NOTE — Progress Notes (Signed)
   02/06/22 2300  Psych Admission Type (Psych Patients Only)  Admission Status Voluntary  Psychosocial Assessment  Patient Complaints Worrying  Eye Contact Fair  Facial Expression Animated  Affect Appropriate to circumstance  Speech Logical/coherent  Interaction Assertive  Motor Activity Slow  Appearance/Hygiene Unremarkable  Behavior Characteristics Cooperative;Calm  Mood Pleasant  Thought Process  Coherency WDL  Content WDL  Delusions None reported or observed  Perception WDL  Hallucination None reported or observed  Judgment WDL  Confusion None  Danger to Self  Current suicidal ideation? Denies  Self-Injurious Behavior No self-injurious ideation or behavior indicators observed or expressed   Agreement Not to Harm Self Yes  Description of Agreement Verbal  Danger to Others  Danger to Others None reported or observed  Danger to Others Abnormal  Harmful Behavior to others No threats or harm toward other people  Destructive Behavior No threats or harm toward property

## 2022-02-07 DIAGNOSIS — F250 Schizoaffective disorder, bipolar type: Secondary | ICD-10-CM

## 2022-02-07 LAB — GLUCOSE, CAPILLARY: Glucose-Capillary: 164 mg/dL — ABNORMAL HIGH (ref 70–99)

## 2022-02-07 MED ORDER — INSULIN ASPART 100 UNIT/ML IJ SOLN: 5.0000 [IU] | Freq: Three times a day (TID) | INTRAMUSCULAR | 0 refills | Status: DC

## 2022-02-07 MED ORDER — GABAPENTIN 400 MG PO CAPS: 400.0000 mg | ORAL_CAPSULE | Freq: Three times a day (TID) | ORAL | 0 refills | Status: DC

## 2022-02-07 MED ORDER — ARIPIPRAZOLE 15 MG PO TABS: 15.0000 mg | ORAL_TABLET | Freq: Every day | ORAL | 0 refills | Status: DC

## 2022-02-07 MED ORDER — HYDROXYZINE HCL 25 MG PO TABS: 25.0000 mg | ORAL_TABLET | Freq: Three times a day (TID) | ORAL | 0 refills | Status: DC | PRN

## 2022-02-07 MED ORDER — TRAZODONE HCL 100 MG PO TABS: 100.0000 mg | ORAL_TABLET | Freq: Every evening | ORAL | 0 refills | Status: DC | PRN

## 2022-02-07 MED ORDER — FLUOXETINE HCL 20 MG PO CAPS: 20.0000 mg | ORAL_CAPSULE | Freq: Every day | ORAL | 0 refills | Status: DC

## 2022-02-07 MED ORDER — DOCUSATE SODIUM 100 MG PO CAPS: 100.0000 mg | ORAL_CAPSULE | Freq: Two times a day (BID) | ORAL | 0 refills | Status: DC

## 2022-02-07 MED ORDER — INSULIN GLARGINE-YFGN 100 UNIT/ML ~~LOC~~ SOLN
14.0000 [IU] | Freq: Every day | SUBCUTANEOUS | Status: DC
  Filled 2022-02-07: qty 10

## 2022-02-07 MED ORDER — NICOTINE POLACRILEX 2 MG MT GUM: 2.0000 mg | CHEWING_GUM | OROMUCOSAL | 0 refills | Status: DC | PRN
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
## 2022-02-07 NOTE — Progress Notes (Signed)
Pt left facility via taxi to sisters home. Pt removed all belongings, valuables, medications, and prescriptions. Pt verbalized understanding of medications, discharge instructions. Pt denies SI/HI/self harm thoughts today as well as a/v hallucinations.

## 2022-02-07 NOTE — BHH Suicide Risk Assessment (Signed)
[facility] Discharge Suicide Risk Assessment   Principal Problem: MDD (major depressive disorder), recurrent episode, severe ([place]) Discharge Diagnoses: Principal Problem:   MDD (major depressive disorder), recurrent episode, severe ([place])   Total Time spent with patient: 45 minutes  Reason for admission: Sara [name] is a 47 year old AA female who presents voluntarily to [facility] from [facility] ED, [place], with past psychiatric diagnoses of schizoaffective disorder, bipolar, and major depressive disorder recurrent severe.  Patient has multiple medical comorbidities.  Patient presents to [facility] with complaint of worsening depression and suicidal thoughts.   PTA Medications:  Albuterol inhaler 1 to 2 puffs every 6 hours as needed for wheezing Gabapentin 300 mg capsule take 3 by mouth 3 times daily, Insulin glargine 100 units per meal, inject 14 units into the skin daily Insulin lispro 100 units inject 1 to 10 units into the skin 3 times daily as needed for high blood sugar Naproxen take 1 capsule by mouth 2 caps 4 times daily as needed Omeprazole 40 mg capsule p.o. daily  Zofran 4 mg disintegrating tabs p.o. every 6 hours as needed as needed for nausea Oxycodone-acetaminophen take 1 tablets by mouth every 4 hours as needed for moderate pain  Hospital Course:   During the patient's hospitalization, patient had extensive initial psychiatric evaluation, and follow-up psychiatric evaluations every day.  Psychiatric diagnoses provided upon initial assessment: schizoaffective disorder bipolar type  Patient's psychiatric medications were adjusted on admission: initiated abilify 15 mg daily for mood and psychosis, prozac 20 mg daily for depression  During the hospitalization, other adjustments were made to the patient's psychiatric medication regimen: abilify and prozac were continued as above, gabapentin was added and titrated for mood and pain, trazodone was added  for sleep, vistaril was added for anxiety  Patient's care was discussed during the interdisciplinary team meeting every day during the hospitalization.  The patient denied having side effects to prescribed psychiatric medication.  Gradually, patient started adjusting to milieu. The patient was evaluated each day by a clinical provider to ascertain response to treatment. Improvement was noted by the patient's report of decreasing symptoms, improved sleep and appetite, affect, medication tolerance, behavior, and participation in unit programming.  Patient was asked each day to complete a self inventory noting mood, mental status, pain, new symptoms, anxiety and concerns.    Symptoms were reported as significantly decreased or resolved completely by discharge.   On day of discharge, patient was evaluated on 02/07/2022 the patient reports that their mood is stable. The patient denied having suicidal thoughts for more than 48 hours prior to discharge.  Patient denies having homicidal thoughts.  Patient denies having auditory hallucinations.  Patient denies any visual hallucinations or other symptoms of psychosis. The patient was motivated to continue taking medication with a goal of continued improvement in mental health.   The patient reports their target psychiatric symptoms of depressed mood, si and paranoia responded well to the psychiatric medications, and the patient reports overall benefit other psychiatric hospitalization. Supportive psychotherapy was provided to the patient. The patient also participated in regular group therapy while hospitalized. Coping skills, problem solving as well as relaxation therapies were also part of the unit programming.  Labs were reviewed with the patient, and abnormal results were discussed with the patient.  The patient is able to verbalize their individual safety plan to this provider.  Behavioral Events: none  Restraints: none  Groups:attended and  participated  Medications Changes: as above  D/C Medications:  refer to dc summary  Sleep  Sleep:Sleep: Good Number of Hours of Sleep: 8   Musculoskeletal: Strength & Muscle Tone: within normal limits Gait & Station: normal Patient leans: N/A  Psychiatric Specialty Exam  General Appearance: appears at stated age, fairly dressed and groomed  Behavior: pleasant and cooperative  Psychomotor Activity:No psychomotor agitation or retardation noted   Eye Contact: good Speech: normal amount, tone, volume and latency   Mood: euthymic Affect: congruent, pleasant and interactive  Thought Process: linear, goal directed, no circumstantial or tangential thought process noted, no racing thoughts or flight of ideas Descriptions of Associations: intact Thought Content: Hallucinations: denies AH, VH , does not appear responding to stimuli Delusions: No paranoia or other delusions noted Suicidal Thoughts: denies SI, intention, plan  Homicidal Thoughts: denies HI, intention, plan   Alertness/Orientation: alert and fully oriented  Insight: fair, improved Judgment: fair, improved  Memory: intact  Executive Functions  Concentration: intact  Attention Span: Fair Recall: intact Fund of Knowledge: fair   Art therapist  Concentration: intact Attention Span: Fair Recall: intact Fund of Knowledge: fair   Assets  Assets: Manufacturing systems engineer; Desire for Improvement; Physical Health   Physical Exam: Physical Exam ROS Blood pressure 108/79, pulse (!) 112, temperature 97.9 F (36.6 C), temperature source Oral, resp. rate 18, height 5' 4.5" (1.638 m), weight 67.1 kg, SpO2 100 %. Body mass index is 25.01 kg/m.  Mental Status Per Nursing Assessment::   On Admission:  Suicidal ideation indicated by patient, Intention to act on suicide plan, Suicide plan, Self-harm behaviors  Demographic Factors:  Unemployed  Loss Factors: NA  Historical Factors: NA  Risk Reduction  Factors:   Living with another person, especially a relative  Continued Clinical Symptoms: improved during hospital stay Bipolar Disorder:   Mixed State  Cognitive Features That Contribute To Risk:  None    Suicide Risk:  Minimal: No identifiable suicidal ideation.  Patients presenting with no risk factors but with morbid ruminations; may be classified as minimal risk based on the severity of the depressive symptoms   Follow-up Information     [facility]. Go to.   Specialty: Behavioral Health Why: Please go to this provider for an assessment, to obtain therapy and medication management services on Monday through Friday, arrive no later than 7:20 am as services are first come, first served. Contact information: [address] [place] [address] [phone]                Plan Of Care/Follow-up recommendations:    Discharge recommendations:     Activity: as tolerated  Diet: heart healthy  # It is recommended to the patient to continue psychiatric medications as prescribed, after discharge from the hospital.     # It is recommended to the patient to follow up with your outpatient psychiatric provider and PCP.   # It was discussed with the patient, the impact of alcohol, drugs, tobacco have been there overall psychiatric and medical wellbeing, and total abstinence from substance use was recommended the patient.ed.   # Prescriptions provided or sent directly to preferred pharmacy at discharge. Patient agreeable to plan. Given opportunity to ask questions. Appears to feel comfortable with discharge.    # In the event of worsening symptoms, the patient is instructed to call the crisis hotline, 911 and or go to the nearest ED for appropriate evaluation and treatment of symptoms. To follow-up with primary care provider for other medical issues, concerns and or health care  needs   # Patient was discharged home with a plan to follow up  as noted above.  -Follow-up with outpatient primary care doctor and other specialists -for management of chronic medical disease, including: patient was recommended to follow with her pcp after dc for diabetes and hypertension.   Patient agrees with D/C instructions and plan.  The patient received suicide prevention pamphlet:  Yes Belongings returned:  Clothing and Valuables  Total Time Spent in Direct Patient Care:  I personally spent 45 minutes on the unit in direct patient care. The direct patient care time included face-to-face time with the patient, reviewing the patient's chart, communicating with other professionals, and coordinating care. Greater than 50% of this time was spent in counseling or coordinating care with the patient regarding goals of hospitalization, psycho-education, and discharge planning needs.   Sara [name] 02/07/2022, 10:10 AM   Sara Amick Winfred Leeds, MD 02/07/2022, 10:10 AM

## 2022-02-07 NOTE — BHH Group Notes (Signed)
Adult Psychoeducational Group Note  Date:  02/07/2022 Time:  9:58 AM  Group Topic/Focus:  Goals Group:   The focus of this group is to help patients establish daily goals to achieve during treatment and discuss how the patient can incorporate goal setting into their daily lives to aide in recovery.  Participation Level:  Active  Participation Quality:  Appropriate  Affect:  Appropriate  Cognitive:  Appropriate  Insight: Appropriate  Engagement in Group:  Engaged  Modes of Intervention:  Discussion  Additional Comments:  Patient goal of the day is attend all groups.   Sara [name] 02/07/2022, 9:58 AM

## 2022-04-01 ENCOUNTER — Other Ambulatory Visit: Payer: Self-pay | Admitting: Registered Nurse

## 2022-04-01 ENCOUNTER — Encounter (HOSPITAL_COMMUNITY): Payer: Self-pay | Admitting: Psychiatry

## 2022-04-01 ENCOUNTER — Inpatient Hospital Stay (HOSPITAL_COMMUNITY)
Admission: AD | Admit: 2022-04-01 | Discharge: 2022-04-04 | DRG: 885 | Disposition: A | Discharge status: Home / Self Care | Payer: No Typology Code available for payment source | Source: Other Acute Inpatient Hospital | Attending: Psychiatry | Admitting: Psychiatry

## 2022-04-01 ENCOUNTER — Other Ambulatory Visit: Payer: Self-pay

## 2022-04-01 DIAGNOSIS — F1490 Cocaine use, unspecified, uncomplicated: Secondary | ICD-10-CM | POA: Diagnosis present

## 2022-04-01 DIAGNOSIS — Z818 Family history of other mental and behavioral disorders: Secondary | ICD-10-CM | POA: Diagnosis not present

## 2022-04-01 DIAGNOSIS — F1290 Cannabis use, unspecified, uncomplicated: Secondary | ICD-10-CM | POA: Diagnosis present

## 2022-04-01 DIAGNOSIS — Z9151 Personal history of suicidal behavior: Secondary | ICD-10-CM | POA: Diagnosis not present

## 2022-04-01 DIAGNOSIS — Z79899 Other long term (current) drug therapy: Secondary | ICD-10-CM | POA: Diagnosis not present

## 2022-04-01 DIAGNOSIS — E1151 Type 2 diabetes mellitus with diabetic peripheral angiopathy without gangrene: Secondary | ICD-10-CM | POA: Diagnosis present

## 2022-04-01 DIAGNOSIS — Z833 Family history of diabetes mellitus: Secondary | ICD-10-CM | POA: Diagnosis not present

## 2022-04-01 DIAGNOSIS — F250 Schizoaffective disorder, bipolar type: Secondary | ICD-10-CM | POA: Diagnosis present

## 2022-04-01 DIAGNOSIS — I10 Essential (primary) hypertension: Secondary | ICD-10-CM | POA: Diagnosis present

## 2022-04-01 DIAGNOSIS — G4730 Sleep apnea, unspecified: Secondary | ICD-10-CM | POA: Diagnosis present

## 2022-04-01 DIAGNOSIS — F419 Anxiety disorder, unspecified: Secondary | ICD-10-CM | POA: Diagnosis present

## 2022-04-01 DIAGNOSIS — E559 Vitamin D deficiency, unspecified: Secondary | ICD-10-CM | POA: Diagnosis present

## 2022-04-01 DIAGNOSIS — K5900 Constipation, unspecified: Secondary | ICD-10-CM | POA: Diagnosis present

## 2022-04-01 DIAGNOSIS — Z8249 Family history of ischemic heart disease and other diseases of the circulatory system: Secondary | ICD-10-CM

## 2022-04-01 DIAGNOSIS — K219 Gastro-esophageal reflux disease without esophagitis: Secondary | ICD-10-CM | POA: Diagnosis present

## 2022-04-01 DIAGNOSIS — R45851 Suicidal ideations: Secondary | ICD-10-CM | POA: Diagnosis present

## 2022-04-01 DIAGNOSIS — Z832 Family history of diseases of the blood and blood-forming organs and certain disorders involving the immune mechanism: Secondary | ICD-10-CM

## 2022-04-01 DIAGNOSIS — F332 Major depressive disorder, recurrent severe without psychotic features: Secondary | ICD-10-CM | POA: Diagnosis present

## 2022-04-01 DIAGNOSIS — Z5900 Homelessness unspecified: Secondary | ICD-10-CM

## 2022-04-01 DIAGNOSIS — I70209 Unspecified atherosclerosis of native arteries of extremities, unspecified extremity: Secondary | ICD-10-CM | POA: Diagnosis present

## 2022-04-01 DIAGNOSIS — F329 Major depressive disorder, single episode, unspecified: Secondary | ICD-10-CM | POA: Diagnosis present

## 2022-04-01 DIAGNOSIS — E782 Mixed hyperlipidemia: Secondary | ICD-10-CM | POA: Diagnosis present

## 2022-04-01 DIAGNOSIS — D573 Sickle-cell trait: Secondary | ICD-10-CM | POA: Diagnosis present

## 2022-04-01 DIAGNOSIS — Z888 Allergy status to other drugs, medicaments and biological substances status: Secondary | ICD-10-CM

## 2022-04-01 DIAGNOSIS — Z5941 Food insecurity: Secondary | ICD-10-CM

## 2022-04-01 DIAGNOSIS — F17210 Nicotine dependence, cigarettes, uncomplicated: Secondary | ICD-10-CM | POA: Diagnosis present

## 2022-04-01 DIAGNOSIS — Z6372 Alcoholism and drug addiction in family: Secondary | ICD-10-CM

## 2022-04-01 DIAGNOSIS — Z5982 Transportation insecurity: Secondary | ICD-10-CM

## 2022-04-01 DIAGNOSIS — Z880 Allergy status to penicillin: Secondary | ICD-10-CM

## 2022-04-01 LAB — GLUCOSE, CAPILLARY
Glucose-Capillary: 172 mg/dL — ABNORMAL HIGH (ref 70–99)
Glucose-Capillary: 338 mg/dL — ABNORMAL HIGH (ref 70–99)

## 2022-04-01 MED ORDER — IBUPROFEN 400 MG PO TABS
400.0000 mg | ORAL_TABLET | ORAL | Status: DC | PRN
  Administered 2022-04-01 – 2022-04-04 (×8): 400 mg via ORAL
  Filled 2022-04-01 (×8): qty 1

## 2022-04-01 MED ORDER — MAGNESIUM HYDROXIDE 400 MG/5ML PO SUSP: 30.0000 mL | Freq: Every day | ORAL | Status: DC | PRN

## 2022-04-01 MED ORDER — DIPHENHYDRAMINE HCL 25 MG PO CAPS
50.0000 mg | ORAL_CAPSULE | Freq: Three times a day (TID) | ORAL | Status: DC | PRN
  Filled 2022-04-01: qty 2

## 2022-04-01 MED ORDER — NICOTINE POLACRILEX 2 MG MT GUM
2.0000 mg | CHEWING_GUM | OROMUCOSAL | Status: DC | PRN
  Administered 2022-04-01 – 2022-04-02 (×2): 2 mg via ORAL
  Filled 2022-04-01: qty 1

## 2022-04-01 MED ORDER — ONDANSETRON 4 MG PO TBDP
4.0000 mg | ORAL_TABLET | Freq: Once | ORAL | Status: AC
  Filled 2022-04-01: qty 1

## 2022-04-01 MED ORDER — INSULIN ASPART 100 UNIT/ML IJ SOLN
0.0000 [IU] | Freq: Three times a day (TID) | INTRAMUSCULAR | Status: DC
  Administered 2022-04-01 – 2022-04-02 (×2): 3 [IU] via SUBCUTANEOUS
  Administered 2022-04-02 (×2): 8 [IU] via SUBCUTANEOUS
  Administered 2022-04-03: 3 [IU] via SUBCUTANEOUS
  Administered 2022-04-03: 8 [IU] via SUBCUTANEOUS
  Administered 2022-04-03: 11 [IU] via SUBCUTANEOUS
  Administered 2022-04-04: 5 [IU] via SUBCUTANEOUS

## 2022-04-01 MED ORDER — INSULIN ASPART 100 UNIT/ML IJ SOLN: 0.0000 [IU] | Freq: Three times a day (TID) | INTRAMUSCULAR | Status: DC

## 2022-04-01 MED ORDER — ONDANSETRON 4 MG PO TBDP
ORAL_TABLET | ORAL | Status: AC
  Administered 2022-04-01: 4 mg via ORAL
  Filled 2022-04-01: qty 1

## 2022-04-01 MED ORDER — ALUM & MAG HYDROXIDE-SIMETH 200-200-20 MG/5ML PO SUSP: 30.0000 mL | ORAL | Status: DC | PRN

## 2022-04-01 MED ORDER — DIPHENHYDRAMINE HCL 50 MG/ML IJ SOLN: 50.0000 mg | Freq: Three times a day (TID) | INTRAMUSCULAR | Status: DC | PRN

## 2022-04-01 MED ORDER — LORAZEPAM 1 MG PO TABS
2.0000 mg | ORAL_TABLET | Freq: Three times a day (TID) | ORAL | Status: DC | PRN
  Administered 2022-04-01: 2 mg via ORAL
  Filled 2022-04-01: qty 2

## 2022-04-01 MED ORDER — INSULIN ASPART 100 UNIT/ML IJ SOLN
0.0000 [IU] | Freq: Every day | INTRAMUSCULAR | Status: DC
  Administered 2022-04-01: 4 [IU] via SUBCUTANEOUS
  Administered 2022-04-02: 3 [IU] via SUBCUTANEOUS
  Administered 2022-04-03: 2 [IU] via SUBCUTANEOUS

## 2022-04-01 MED ORDER — HYDROXYZINE HCL 25 MG PO TABS
25.0000 mg | ORAL_TABLET | Freq: Three times a day (TID) | ORAL | Status: DC | PRN
  Administered 2022-04-01 – 2022-04-02 (×2): 25 mg via ORAL
  Filled 2022-04-01 (×2): qty 1

## 2022-04-01 MED ORDER — GABAPENTIN 400 MG PO CAPS
400.0000 mg | ORAL_CAPSULE | Freq: Three times a day (TID) | ORAL | Status: DC
  Administered 2022-04-01 – 2022-04-04 (×8): 400 mg via ORAL
  Filled 2022-04-01 (×12): qty 1

## 2022-04-01 MED ORDER — HALOPERIDOL LACTATE 5 MG/ML IJ SOLN: 5.0000 mg | Freq: Three times a day (TID) | INTRAMUSCULAR | Status: DC | PRN

## 2022-04-01 MED ORDER — LORAZEPAM 2 MG/ML IJ SOLN: 2.0000 mg | Freq: Three times a day (TID) | INTRAMUSCULAR | Status: DC | PRN

## 2022-04-01 MED ORDER — HALOPERIDOL 5 MG PO TABS
5.0000 mg | ORAL_TABLET | Freq: Three times a day (TID) | ORAL | Status: DC | PRN
  Administered 2022-04-01: 5 mg via ORAL
  Filled 2022-04-01: qty 1
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
## 2022-04-01 NOTE — Group Note (Signed)
LCSW Group Therapy Note  Group Date: 04/01/2022 Start Time: 1100 End Time: 1200   Type of Therapy and Topic:  Group Therapy - Healthy vs Unhealthy Coping Skills  Participation Level:  Did Not Attend   Description of Group The focus of this group was to determine what unhealthy coping techniques typically are used by group members and what healthy coping techniques would be helpful in coping with various problems. Patients were guided in becoming aware of the differences between healthy and unhealthy coping techniques. Patients were asked to identify 2-3 healthy coping skills they would like to learn to use more effectively.  Therapeutic Goals Patients learned that coping is what human beings do all day long to deal with various situations in their lives Patients defined and discussed healthy vs unhealthy coping techniques Patients identified their preferred coping techniques and identified whether these were healthy or unhealthy Patients determined 2-3 healthy coping skills they would like to become more familiar with and use more often. Patients provided support and ideas to each other    Therapeutic Modalities Cognitive Behavioral Therapy Motivational Interviewing  Windle Guard, LCSW 04/01/2022  3:18 PM

## 2022-04-01 NOTE — Progress Notes (Signed)
Assisted primary nurse with a show of support for patient. Pt was in her room, crying and verbalizing she as having thoughts of hurting herself by methods of OD or stepping infront of a car. Pt stated she felt like she needed to leave but this place was not going to help her. She began crying loudly, yelling and saying she has no one and missed her dad and she doesn't have anywhere to go. She was also complaining of chronic pain and nausea.   Staff talked and verbally de-escalated patient. She was given verbal reassurance and made aware she is safe to talk to staff about any concerns. She then verbally contracted for safety and made nurse aware that she will come to [place] if she is having thoughts of harming herself and intentions of acting on it. She was given PRN meds for agitation, anxiety, and nausea.

## 2022-04-01 NOTE — Progress Notes (Signed)
Psychoeducational Group Note  Date:  04/01/2022 Time:  2047  Group Topic/Focus:  Wrap-Up Group:   The focus of this group is to help patients review their daily goal of treatment and discuss progress on daily workbooks.  Participation Level: Did Not Attend  Participation Quality:  Not Applicable  Affect:  Not Applicable  Cognitive:  Not Applicable  Insight:  Not Applicable  Engagement in Group: Not Applicable  Additional Comments:  The patient did not attend group this evening.   Gennette Pac 04/01/2022, 8:47 PM

## 2022-04-01 NOTE — Tx Team (Signed)
Initial Treatment Plan 04/01/2022 2:42 PM Sara [name] [id number]    PATIENT STRESSORS: Financial difficulties   Medication change or noncompliance   Substance abuse     PATIENT STRENGTHS: Motivation for treatment/growth    PATIENT IDENTIFIED PROBLEMS: Depression  Substance Abuse  Med non-compliance  Homeless               DISCHARGE CRITERIA:  Ability to meet basic life and health needs Improved stabilization in mood, thinking, and/or behavior Verbal commitment to aftercare and medication compliance  PRELIMINARY DISCHARGE PLAN: Placement in alternative living arrangements  PATIENT/FAMILY INVOLVEMENT: This treatment plan has been presented to and reviewed with the patient, Sara [name]. The patient has been given the opportunity to ask questions and make suggestions.  Melinda Crutch, RN 04/01/2022, 2:42 PM

## 2022-04-02 ENCOUNTER — Encounter (HOSPITAL_COMMUNITY): Payer: Self-pay

## 2022-04-02 LAB — GLUCOSE, CAPILLARY
Glucose-Capillary: 170 mg/dL — ABNORMAL HIGH (ref 70–99)
Glucose-Capillary: 286 mg/dL — ABNORMAL HIGH (ref 70–99)
Glucose-Capillary: 289 mg/dL — ABNORMAL HIGH (ref 70–99)
Glucose-Capillary: 277 mg/dL — ABNORMAL HIGH (ref 70–99)

## 2022-04-02 MED ORDER — ALBUTEROL SULFATE HFA 108 (90 BASE) MCG/ACT IN AERS: 1.0000 | INHALATION_SPRAY | Freq: Four times a day (QID) | RESPIRATORY_TRACT | Status: DC | PRN

## 2022-04-02 MED ORDER — INSULIN GLARGINE W/ TRANS PORT 100 UNIT/ML ~~LOC~~ SOPN: 14.0000 [IU] | PEN_INJECTOR | Freq: Every day | SUBCUTANEOUS | Status: DC

## 2022-04-02 MED ORDER — ARIPIPRAZOLE 10 MG PO TABS
10.0000 mg | ORAL_TABLET | Freq: Every day | ORAL | Status: DC
  Administered 2022-04-02 – 2022-04-03 (×2): 10 mg via ORAL
  Filled 2022-04-02 (×4): qty 1

## 2022-04-02 MED ORDER — PROPRANOLOL HCL 20 MG PO TABS
20.0000 mg | ORAL_TABLET | Freq: Once | ORAL | Status: AC
  Administered 2022-04-02: 20 mg via ORAL
  Filled 2022-04-02: qty 1

## 2022-04-02 MED ORDER — ARIPIPRAZOLE 15 MG PO TABS: 15.0000 mg | ORAL_TABLET | Freq: Every day | ORAL | Status: DC

## 2022-04-02 MED ORDER — LISINOPRIL 5 MG PO TABS
5.0000 mg | ORAL_TABLET | Freq: Once | ORAL | Status: AC
  Administered 2022-04-02: 5 mg via ORAL
  Filled 2022-04-02 (×2): qty 1

## 2022-04-02 MED ORDER — INSULIN GLARGINE-YFGN 100 UNIT/ML ~~LOC~~ SOLN
14.0000 [IU] | Freq: Every day | SUBCUTANEOUS | Status: DC
  Administered 2022-04-02 – 2022-04-03 (×2): 14 [IU] via SUBCUTANEOUS

## 2022-04-02 MED ORDER — CLONIDINE HCL 0.1 MG PO TABS
0.1000 mg | ORAL_TABLET | Freq: Once | ORAL | Status: DC
  Filled 2022-04-02: qty 1

## 2022-04-02 MED ORDER — FLUOXETINE HCL 20 MG PO CAPS
20.0000 mg | ORAL_CAPSULE | Freq: Every day | ORAL | Status: DC
  Administered 2022-04-02 – 2022-04-04 (×3): 20 mg via ORAL
  Filled 2022-04-02 (×5): qty 1

## 2022-04-02 MED ORDER — PANTOPRAZOLE SODIUM 40 MG PO TBEC
40.0000 mg | DELAYED_RELEASE_TABLET | Freq: Every day | ORAL | Status: DC
  Administered 2022-04-02 – 2022-04-04 (×3): 40 mg via ORAL
  Filled 2022-04-02 (×4): qty 1

## 2022-04-02 MED ORDER — DOCUSATE SODIUM 100 MG PO CAPS
100.0000 mg | ORAL_CAPSULE | Freq: Two times a day (BID) | ORAL | Status: DC
  Administered 2022-04-02 – 2022-04-04 (×5): 100 mg via ORAL
  Filled 2022-04-02 (×7): qty 1

## 2022-04-02 MED ORDER — LISINOPRIL 10 MG PO TABS
10.0000 mg | ORAL_TABLET | Freq: Every day | ORAL | Status: DC
  Filled 2022-04-02 (×3): qty 1

## 2022-04-02 MED ORDER — FLUOXETINE HCL 20 MG PO CAPS: 20.0000 mg | ORAL_CAPSULE | Freq: Every day | ORAL | Status: DC

## 2022-04-02 MED ORDER — TRAZODONE HCL 100 MG PO TABS
100.0000 mg | ORAL_TABLET | Freq: Every evening | ORAL | Status: DC | PRN
  Administered 2022-04-02: 100 mg via ORAL
  Filled 2022-04-02: qty 1

## 2022-04-02 NOTE — Inpatient Diabetes Management (Signed)
Inpatient Diabetes Program Recommendations  AACE/ADA: New Consensus Statement on Inpatient Glycemic Control (2015)  Target Ranges:  Prepandial:   less than 140 mg/dL      Peak postprandial:   less than 180 mg/dL (1-2 hours)      Critically ill patients:  140 - 180 mg/dL   Lab Results  Component Value Date   GLUCAP 170 (H) 04/02/2022   HGBA1C 12.7 (H) 02/01/2022    Review of Glycemic Control  Latest Reference Range & Units 04/01/22 17:10 04/01/22 21:45 04/02/22 05:49  Glucose-Capillary 70 - 99 mg/dL 172 (H) 338 (H) 170 (H)   Diabetes history: DM 2 Outpatient Diabetes medications: Glargine 14 units Daily, Novolog 5 units tid Current orders for Inpatient glycemic control:  Novolog 0-15 units tid + hs  A1c 12.7 Consult for glucose monitoring and assistance with management  Inpatient Diabetes Program Recommendations:    Agree with current insulin orders and management. Will monitor glucose trends today and adjust as needed.  Thanks,  Tama Headings RN, MSN, BC-ADM Inpatient Diabetes Coordinator Team Pager [phone] (8a-5p)

## 2022-04-02 NOTE — BH IP Treatment Plan (Signed)
Interdisciplinary Treatment and Diagnostic Plan Update  04/02/2022 Time of Session: 9:30am  Sara [name] MRN: [id number]  Principal Diagnosis: MDD (major depressive disorder)  Secondary Diagnoses: Principal Problem:   MDD (major depressive disorder)   Current Medications:  Current Facility-Administered Medications  Medication Dose Route Frequency Provider Last Rate Last Admin   albuterol (VENTOLIN HFA) 108 (90 Base) MCG/ACT inhaler 1-2 puff  1-2 puff Inhalation Q6H PRN Nwoko, Herbert Pun I, NP       alum & mag hydroxide-simeth (MAALOX/MYLANTA) 200-200-20 MG/5ML suspension 30 mL  30 mL Oral Q4H PRN Nkwenti, Doris, NP       cloNIDine (CATAPRES) tablet 0.1 mg  0.1 mg Oral Once Onuoha, Chinwendu V, NP       diphenhydrAMINE (BENADRYL) capsule 50 mg  50 mg Oral TID PRN Nicholes Rough, NP       Or   diphenhydrAMINE (BENADRYL) injection 50 mg  50 mg Intramuscular TID PRN Nicholes Rough, NP       docusate sodium (COLACE) capsule 100 mg  100 mg Oral BID Nwoko, Agnes I, NP       gabapentin (NEURONTIN) capsule 400 mg  400 mg Oral TID Onuoha, Chinwendu V, NP   400 mg at 04/02/22 0844   haloperidol (HALDOL) tablet 5 mg  5 mg Oral TID PRN Nicholes Rough, NP   5 mg at 04/01/22 2038   Or   haloperidol lactate (HALDOL) injection 5 mg  5 mg Intramuscular TID PRN Nicholes Rough, NP       hydrOXYzine (ATARAX) tablet 25 mg  25 mg Oral TID PRN Nicholes Rough, NP   25 mg at 04/01/22 2006   ibuprofen (ADVIL) tablet 400 mg  400 mg Oral Q4H PRN Ranae Palms, MD   400 mg at 04/02/22 0721   insulin aspart (novoLOG) injection 0-15 Units  0-15 Units Subcutaneous TID WC Attiah, Nadir, MD   3 Units at 04/02/22 0648   insulin aspart (novoLOG) injection 0-5 Units  0-5 Units Subcutaneous QHS Onuoha, Chinwendu V, NP   4 Units at 04/01/22 2237   LORazepam (ATIVAN) tablet 2 mg  2 mg Oral TID PRN Nicholes Rough, NP   2 mg at 04/01/22 2038   Or   LORazepam (ATIVAN) injection 2 mg  2 mg Intramuscular TID PRN Nicholes Rough, NP        magnesium hydroxide (MILK OF MAGNESIA) suspension 30 mL  30 mL Oral Daily PRN Nicholes Rough, NP       nicotine polacrilex (NICORETTE) gum 2 mg  2 mg Oral PRN Winfred Leeds, Nadir, MD   2 mg at 04/01/22 2007   pantoprazole (PROTONIX) EC tablet 40 mg  40 mg Oral Daily Nwoko, Agnes I, NP       traZODone (DESYREL) tablet 100 mg  100 mg Oral QHS PRN Lindell Spar I, NP       PTA Medications: Medications Prior to Admission  Medication Sig Dispense Refill Last Dose   acetaminophen (TYLENOL) 500 MG tablet Take 1,000 mg by mouth every 4 (four) hours as needed for mild pain or moderate pain (alternating with Ibuprofen).      albuterol (VENTOLIN HFA) 108 (90 Base) MCG/ACT inhaler Inhale 1-2 puffs into the lungs every 6 (six) hours as needed for wheezing.   Past Week   ARIPiprazole (ABILIFY) 15 MG tablet Take 1 tablet (15 mg total) by mouth at bedtime. 30 tablet 0 03/30/2022   docusate sodium (COLACE) 100 MG capsule Take 1 capsule (100 mg total) by mouth 2 (two)  times daily. 60 capsule 0    gabapentin (NEURONTIN) 400 MG capsule Take 1 capsule (400 mg total) by mouth 3 (three) times daily. 90 capsule 0 03/30/2022   hydrOXYzine (ATARAX) 25 MG tablet Take 1 tablet (25 mg total) by mouth 3 (three) times daily as needed for anxiety. 30 tablet 0 03/30/2022   ibuprofen (ADVIL) 200 MG tablet Take 800 mg by mouth every 8 (eight) hours as needed for mild pain or moderate pain. Alternating with Tylenol      insulin aspart (NOVOLOG) 100 UNIT/ML injection Inject 5 Units into the skin 3 (three) times daily with meals. 5 mL 0    Insulin Glargine w/ Trans Port 100 UNIT/ML SOPN Inject 14 Units into the skin daily.      omeprazole (PRILOSEC) 40 MG capsule Take 40 mg by mouth daily.      traZODone (DESYREL) 100 MG tablet Take 1 tablet (100 mg total) by mouth at bedtime as needed for sleep. 30 tablet 0 Past Month   FLUoxetine (PROZAC) 20 MG capsule Take 1 capsule (20 mg total) by mouth daily. 30 capsule 0 03/30/2022    Patient  Stressors: Financial difficulties   Medication change or noncompliance   Substance abuse    Patient Strengths: Motivation for treatment/growth   Treatment Modalities: Medication Management, Group therapy, Case management,  1 to 1 session with clinician, Psychoeducation, Recreational therapy.   Physician Treatment Plan for Primary Diagnosis: MDD (major depressive disorder) Long Term Goal(s): Improvement in symptoms so as ready for discharge   Short Term Goals: Ability to identify changes in lifestyle to reduce recurrence of condition will improve Ability to verbalize feelings will improve Ability to disclose and discuss suicidal ideas Ability to demonstrate self-control will improve  Medication Management: Evaluate patient's response, side effects, and tolerance of medication regimen.  Therapeutic Interventions: 1 to 1 sessions, Unit Group sessions and Medication administration.  Evaluation of Outcomes: Not Met  Physician Treatment Plan for Secondary Diagnosis: Principal Problem:   MDD (major depressive disorder)  Long Term Goal(s): Improvement in symptoms so as ready for discharge   Short Term Goals: Ability to identify changes in lifestyle to reduce recurrence of condition will improve Ability to verbalize feelings will improve Ability to disclose and discuss suicidal ideas Ability to demonstrate self-control will improve     Medication Management: Evaluate patient's response, side effects, and tolerance of medication regimen.  Therapeutic Interventions: 1 to 1 sessions, Unit Group sessions and Medication administration.  Evaluation of Outcomes: Not Met   RN Treatment Plan for Primary Diagnosis: MDD (major depressive disorder) Long Term Goal(s): Knowledge of disease and therapeutic regimen to maintain health will improve  Short Term Goals: Ability to remain free from injury will improve, Ability to participate in decision making will improve, Ability to verbalize feelings  will improve, Ability to disclose and discuss suicidal ideas, and Ability to identify and develop effective coping behaviors will improve  Medication Management: RN will administer medications as ordered by provider, will assess and evaluate patient's response and provide education to patient for prescribed medication. RN will report any adverse and/or side effects to prescribing provider.  Therapeutic Interventions: 1 on 1 counseling sessions, Psychoeducation, Medication administration, Evaluate responses to treatment, Monitor vital signs and CBGs as ordered, Perform/monitor CIWA, COWS, AIMS and Fall Risk screenings as ordered, Perform wound care treatments as ordered.  Evaluation of Outcomes: Not Met   LCSW Treatment Plan for Primary Diagnosis: MDD (major depressive disorder) Long Term Goal(s): Safe transition to appropriate next  level of care at discharge, Engage patient in therapeutic group addressing interpersonal concerns.  Short Term Goals: Engage patient in aftercare planning with referrals and resources, Increase social support, Increase emotional regulation, Facilitate acceptance of mental health diagnosis and concerns, Identify triggers associated with mental health/substance abuse issues, and Increase skills for wellness and recovery  Therapeutic Interventions: Assess for all discharge needs, 1 to 1 time with Social worker, Explore available resources and support systems, Assess for adequacy in community support network, Educate family and significant other(s) on suicide prevention, Complete Psychosocial Assessment, Interpersonal group therapy.  Evaluation of Outcomes: Not Met   Progress in Treatment: Attending groups: Yes. Participating in groups: Yes. Taking medication as prescribed: Yes. Toleration medication: Yes. Family/Significant other contact made: Yes, individual(s) contacted:  If consent is provided  Patient understands diagnosis: Yes. Discussing patient identified  problems/goals with staff: Yes. Medical problems stabilized or resolved: Yes. Denies suicidal/homicidal ideation: Yes. Issues/concerns per patient self-inventory: No.   New problem(s) identified: No, Describe:  None   New Short Term/Long Term Goal(s): medication stabilization, elimination of SI thoughts, development of comprehensive mental wellness plan.   Patient Goals: "To get sober and go to inpatient treatment"  Discharge Plan or Barriers: Patient recently admitted. CSW will continue to follow and assess for appropriate referrals and possible discharge planning.   Reason for Continuation of Hospitalization: Anxiety Depression Medication stabilization Suicidal ideation  Estimated Length of Stay: 3 to 7 days   Last 3 [place] Suicide Severity Risk Score: [place] Admission (Current) from 04/01/2022 in [place] [facility] Admission (Discharged) from 01/29/2022 in [place] [facility] ED from 03/06/2020 in [facility] Emergency Department at [place] Low Risk High Risk No Risk       Last PHQ 2/9 Scores:     No data to display          Scribe for Treatment Team: Darleen Crocker, Latanya Presser 04/02/2022 11:50 AM

## 2022-04-02 NOTE — Progress Notes (Signed)
[place] Group Notes:  (Nursing/MHT/Case Management/Adjunct)  Date:  04/02/2022  Time:  2000 Type of Therapy:   wrap up group  Participation Level:    Participation Quality:  Appropriate and Attentive  Affect:  Appropriate  Cognitive:  Alert  Insight:    Engagement in Group:  Engaged  Modes of Intervention:  Education and Support  Summary of Progress/Problems:  Sara [name] 04/02/2022, 9:51 PM

## 2022-04-02 NOTE — Group Note (Signed)
Date:  04/02/2022 Time:  4:20 PM  Group Topic/Focus:  Healthy Communication:   The focus of this group is to discuss communication, barriers to communication, as well as healthy ways to communicate with others. Making Healthy Choices:   The focus of this group is to help patients identify negative/unhealthy choices they were using prior to admission and identify positive/healthier coping strategies to replace them upon discharge.    Participation Level:  Active  Participation Quality:  Appropriate  Affect:  Appropriate  Cognitive:  Appropriate  Insight: Appropriate  Engagement in Group:  Engaged  Modes of Intervention:  Discussion  Additional Comments:    Sara [name] 04/02/2022, 4:20 PM
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
## 2022-04-02 NOTE — Group Note (Signed)
Date:  04/02/2022 Time:  9:32 AM  Group Topic/Focus:  Goals Group:   The focus of this group is to help patients establish daily goals to achieve during treatment and discuss how the patient can incorporate goal setting into their daily lives to aide in recovery. Orientation:   The focus of this group is to educate the patient on the purpose and policies of crisis stabilization and provide a format to answer questions about their admission.  The group details unit policies and expectations of patients while admitted.    Participation Level:  Did Not Attend  Participation Quality:    Affect:    Cognitive:    Insight:   Engagement in Group:    Modes of Intervention:    Additional Comments:    Garvin Fila 04/02/2022, 9:32 AM

## 2022-04-02 NOTE — Plan of Care (Signed)
  Problem: Education: Goal: Knowledge of [facility] Education information/materials will improve Outcome: Progressing Goal: Emotional status will improve Outcome: Progressing Goal: Mental status will improve Outcome: Progressing   Problem: Activity: Goal: Interest or engagement in activities will improve Outcome: Progressing

## 2022-04-02 NOTE — Progress Notes (Addendum)
   04/01/22 2000  Psych Admission Type (Psych Patients Only)  Admission Status Voluntary  Psychosocial Assessment  Patient Complaints Anxiety;Depression;Self-harm thoughts;Restlessness  Eye Contact Fair  Facial Expression Anxious;Sad;Sullen;Other (Comment) (Crying)  Affect Anxious;Depressed;Labile;Sad;Sullen  Speech Logical/coherent  Interaction Assertive  Motor Activity Slow  Appearance/Hygiene In hospital gown  Behavior Characteristics Anxious;Pacing;Guarded  Mood Depressed;Anxious;Sad;Sullen  Thought Process  Coherency WDL  Content WDL  Delusions None reported or observed  Perception WDL  Hallucination None reported or observed  Judgment Poor  Confusion None  Danger to Self  Current suicidal ideation? Active  Self-Injurious Behavior Self-injurious ideation verbalized  Agreement Not to Harm Self No  Description of Agreement Patient refused verbal contract for safety. NP, AC made aware.  Danger to Others  Danger to Others None reported or observed   Patient alert and oriented. Presenting with an anxious, depressed affect and mood. Patient endorses SI with anxiety and depression rated 10/10. Patient denies AVH, HI. Patient states that she is experiencing active SI without an active plan at this time, stating "Im thinking, but I am not going to tell anyone because I don't want anyone's help." RN and staff assisted with show of support for patient. Patient visibly upset, crying and pacing in room and was able to be verbally de-escalated by staff. Verbal reassurance provided and made aware to inform staff of concerns. Agitation protocol administered, per provider orders. Scheduled medications administered to patient, per provider orders. PRNs administered for nausea and 10/10 chronic pain in her back, radiating to her abdomen. Patient was able to verbally contract for safety and agreed to inform a member of staff prior to acting on any thoughts of harm. Support and encouragement provided.  Routine safety checks conducted every 15 minutes. No acute distress noted, patient remains safe on the unit.

## 2022-04-02 NOTE — Progress Notes (Signed)
Pt stated she would like to have the  last dose of Neurontin moved to HS due to her taking her last dose at 5pm , it is 15 hrs before she gets another dose. Pt educated on talking to the doctor to spread the doses out or at least change the last dose to night time.      04/02/22 2200  Psych Admission Type (Psych Patients Only)  Admission Status Voluntary  Psychosocial Assessment  Patient Complaints Anxiety;Depression  Eye Contact Fair  Facial Expression Anxious;Sad  Affect Depressed;Anxious  Speech Logical/coherent  Interaction Assertive  Motor Activity Slow  Appearance/Hygiene In hospital gown  Behavior Characteristics Anxious  Mood Depressed  Aggressive Behavior  Effect No apparent injury  Thought Process  Coherency WDL  Content WDL  Delusions WDL  Perception WDL  Hallucination None reported or observed  Judgment Poor  Confusion None  Danger to Self  Current suicidal ideation? Passive  Self-Injurious Behavior Some self-injurious ideation observed or expressed.  No lethal plan expressed   Agreement Not to Harm Self Yes  Description of Agreement verbal contracy for safety  Danger to Others  Danger to Others None reported or observed

## 2022-04-02 NOTE — Progress Notes (Signed)
   04/02/22 0900  Psych Admission Type (Psych Patients Only)  Admission Status Voluntary  Psychosocial Assessment  Patient Complaints Anxiety;Depression  Eye Contact Fair  Facial Expression Anxious;Sad  Affect Depressed;Anxious  Speech Logical/coherent  Interaction Assertive  Motor Activity Slow  Appearance/Hygiene In hospital gown  Behavior Characteristics Anxious  Mood Depressed;Anxious  Thought Process  Coherency WDL  Content WDL  Delusions None reported or observed  Perception WDL  Hallucination None reported or observed  Judgment Poor  Confusion None  Danger to Self  Current suicidal ideation? Active  Self-Injurious Behavior No self-injurious ideation or behavior indicators observed or expressed   Agreement Not to Harm Self No  Description of Agreement Verbally Contracts for safety  Danger to Others  Danger to Others None reported or observed
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
## 2022-04-02 NOTE — Progress Notes (Signed)
   04/02/22 0611  Vitals  BP 123/78  MAP (mmHg) 90  BP Location Left Arm  BP Method Automatic  Patient Position (if appropriate) Standing  Pulse Rate (!) 123  Pulse Rate Source Monitor  Oxygen Therapy  SpO2 100 %   Patient BP elevated 139/98 on first check this am, pulse rate elevated 123. Patient denies associated cardiac symptoms at this time. Jordan Hawks, NP notified of vitals. PRN clonidine 0.1 mg once ordered. Patient refused ordered clonidine, stating medication "makes me feel funny and drops my blood pressure too low". Patient states she takes other cardiac medication for BP/HR cannot recall name. RN informed Dr. Winfred Leeds, Dr. Renard Hamper. EKG completed and placed on patient chart, provider informed of EKG results. Patient received 20 mg propanolol once. Patient denies symptoms at this time. No acute distress noted.

## 2022-04-02 NOTE — Group Note (Signed)
Recreation Therapy Group Note   Group Topic:Leisure Education  Group Date: 04/02/2022 Start Time: 1400 End Time: [id number] Facilitators: Icholas Irby-McCall, LRT,CTRS Location: [address] Dayroom   Activity Description/Intervention: Therapeutic Drumming. Patients with peers and staff were given the opportunity to engage in a leader facilitated [place] with staff from the [employer], in partnership with [employer]. Nurse, adult and trained [employer], Devin Going leading with LRT observing and documenting intervention and pt response. This evidenced-based practice targets 7 areas of health and wellbeing in the human experience including: stress-reduction, exercise, self-expression, camaraderie/support, nurturing, spirituality, and music-making (leisure).   Goal Area(s) Addresses:  Patient will engage in pro-social way in music group.  Patient will follow directions of drum leader on the first prompt. Patient will demonstrate no behavioral issues during group.  Patient will identify if a reduction in stress level occurs as a result of participation in therapeutic drum circle.     Affect/Mood: Appropriate   Participation Level: Engaged   Participation Quality: Independent   Behavior: Appropriate   Speech/Thought Process: Focused   Insight: Good   Judgement: Good   Modes of Intervention: Nurse, adult   Patient Response to Interventions:  Engaged   Education Outcome:  Acknowledges education and In group clarification offered    Clinical Observations/Individualized Feedback: Pt was bright and smiling throughout group session.  Pt was attentive and engaged during group session.    Plan: Continue to engage patient in RT group sessions 2-3x/week.   Nyala Kirchner-McCall, LRT,CTRS  04/02/2022 3:59 PM

## 2022-04-03 LAB — GLUCOSE, CAPILLARY
Glucose-Capillary: 313 mg/dL — ABNORMAL HIGH (ref 70–99)
Glucose-Capillary: 254 mg/dL — ABNORMAL HIGH (ref 70–99)
Glucose-Capillary: 182 mg/dL — ABNORMAL HIGH (ref 70–99)
Glucose-Capillary: 216 mg/dL — ABNORMAL HIGH (ref 70–99)

## 2022-04-03 MED ORDER — TRAZODONE HCL 50 MG PO TABS: 50.0000 mg | ORAL_TABLET | Freq: Every evening | ORAL | Status: DC | PRN

## 2022-04-03 NOTE — [facility] Group Notes (Signed)
[facility] Group Notes:  (Nursing/MHT/Case Management/Adjunct)  Date:  04/03/2022  Time:  3:43 PM  Type of Therapy:  Group Therapy  Participation Level:  Active  Participation Quality:  Appropriate  Affect:  Appropriate  Cognitive:  Appropriate  Insight:  Good  Engagement in Group:  Engaged  Modes of Intervention:  Discussion  Summary of Progress/Problems:  Redmond Pulling 04/03/2022, 3:43 PM

## 2022-04-03 NOTE — Plan of Care (Signed)
  Problem: Coping: Goal: Ability to verbalize frustrations and anger appropriately will improve Outcome: Progressing Goal: Ability to demonstrate self-control will improve Outcome: Progressing   

## 2022-04-03 NOTE — B[facility] Group Notes (Signed)
Danity attended wrap up group. Talked about goals. She said shes going to the [place] and shes excited to continue to do better.

## 2022-04-03 NOTE — Inpatient Diabetes Management (Signed)
Inpatient Diabetes Program Recommendations  AACE/ADA: New Consensus Statement on Inpatient Glycemic Control (2015)  Target Ranges:  Prepandial:   less than 140 mg/dL      Peak postprandial:   less than 180 mg/dL (1-2 hours)      Critically ill patients:  140 - 180 mg/dL   Lab Results  Component Value Date   GLUCAP 254 (H) 04/03/2022   HGBA1C 12.7 (H) 02/01/2022    Review of Glycemic Control  Diabetes history: DM2 Outpatient Diabetes medications: Glargine 14 QD, Novolog 5 TID Current orders for Inpatient glycemic control: Semglee 14 QD, Novolog 0-15 TID with meals and 0-5 HS  HgbA1C - 12.7%  Inpatient Diabetes Program Recommendations:    Consider increasing Semglee to 16 units QHS Consider adding meal coverage - Novolog 4 units TID with meals if eating > 50%  Continue to follow trends.  Thank you. Lorenda Peck, RD, LDN, [place] Inpatient Diabetes Coordinator [phone]

## 2022-04-03 NOTE — Progress Notes (Signed)
D. Pt complained of 'tiredness' this morning, but since has improved. Pt has been visible in the milieu interacting appropriately with peers and attending groups. Per pt's self inventory, pt rated her depression,hopelessness and anxiety all 0's. Pt reported that she slept poorly last night, described her energy level as 'low' and concentration 'poor'.. Pt currently denies SI/HI and AVH  A. Labs and vitals monitored. Pt given and educated on medications. Pt supported emotionally and encouraged to express concerns and ask questions.   R. Pt remains safe with 15 minute checks. Will continue POC.

## 2022-04-03 NOTE — Group Note (Signed)
[facility] LCSW Group Therapy Note   Group Date: 04/03/2022 Start Time: 1100 End Time: 1200  Type of Therapy/Topic:  Group Therapy:  Feelings about Diagnosis  Participation Level:  Active   Mood: Engaged   Description of Group:    This group will allow patients to explore their thoughts and feelings about diagnoses they have received. Patients will be guided to explore their level of understanding and acceptance of these diagnoses. Facilitator will encourage patients to process their thoughts and feelings about the reactions of others to their diagnosis, and will guide patients in identifying ways to discuss their diagnosis with significant others in their lives. This group will be process-oriented, with patients participating in exploration of their own experiences as well as giving and receiving support and challenge from other group members.   Therapeutic Goals: 1. Patient will demonstrate understanding of diagnosis as evidence by identifying two or more symptoms of the disorder:  2. Patient will be able to express two feelings regarding the diagnosis 3. Patient will demonstrate ability to communicate their needs through discussion and/or role plays  Summary of Patient Progress:        Therapeutic Modalities:   Cognitive Behavioral Therapy Brief Therapy Feelings Identification    Sara [name] S Rivkah Wolz, LCSW

## 2022-04-03 NOTE — Progress Notes (Signed)
[facility] MD Progress Note  04/03/2022 8:53 AM Sara [name]  MRN:  [id number]  Reason for admission: 47 year old AA female with hx of chronic depression, chronic substance use disorders & chronic homelessness. She was admitted, treated, stabilized in this hospital last January of 2024. She was stabilized & discharged on medications with outpatient psychiatric services to maintain mood stability. Patient is being admitted to the [facility] this time around from the [facility] with complaint of worsening symptoms of depression/suicidal ideations with an attempts to overdose on medications that failed   Daily notes: Sara [name] is seen, chart reviewed. The chart findings discussed with the treatment team. She presents alert, oriented & aware of situation. She is visible on the unit, attending group sessions. She says she is feeling better. Complained of feeling tired upon waking up in the morning, Blamed it on the her blood pressure medicine. As a result, she declined to take her lisinopril this morning. Her pulse rate remains elevated. Patient reports that "It is because I'm in a lot of pain. I don't need any medicines for my blood pressure or my heart rate. My mood is not so bad today. My depression is #4 & anxiety #3". Sara [name] currently denies any SIHI, AVH, delusional thoughts or paranoia. She does not appear to be responding to any internal stimuli. The SW reported this morning that Sara [name] has been accepted to go to an [facility] after discharge. Patient is currently in no apparent distress. Will continue current plan of care as already in progress.  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 pylori infection    High risk medication use    Hypercholesterolemia    Hyperlipidemia, mixed    Hypertension    Other mixed anxiety disorders    Pollen allergies    Sickle cell trait ([place])    Sleep apnea    Vitamin D deficiency     Past Surgical History:  Procedure Laterality Date   ABDOMINAL HYSTERECTOMY     APPENDECTOMY     COLONOSCOPY  08-16-2008   Dr. Ladean Raya   ESOPHAGOGASTRODUODENOSCOPY  08-16-2008   Dr. Orlena Sheldon    Family History:  Family History  Problem Relation Age of Onset   Clotting disorder Mother    Crohn's disease Mother    Heart disease Mother    Colon cancer Father    Clotting disorder Brother    Diabetes Brother    Diabetes Maternal Aunt    Liver cancer Maternal Uncle    Prostate cancer Maternal Uncle    Colon cancer Maternal Uncle    Diabetes Paternal Aunt    Esophageal cancer Paternal Uncle    Family Psychiatric  History: See H&P.  Social History:  Social History   Substance and Sexual Activity  Alcohol Use None     Social History   Substance and Sexual Activity  Drug Use Yes   Types: Marijuana    Social History   Socioeconomic History  Marital status: Married    Spouse name: Not on file   Number of children: 2   Years of education: Not on file   Highest education level: Not on file  Occupational History   Occupation: n/a  Tobacco Use   Smoking status: Every Day    Packs/day: 1.00    Types: Cigarettes   Smokeless tobacco: Never  Substance and Sexual Activity   Alcohol use: Not on file   Drug use: Yes    Types: Marijuana   Sexual activity: Not on file  Other Topics Concern   Not on file  Social History Narrative   Not on file   Social Determinants of Health   Financial Resource Strain: Not on file  Food Insecurity: Food  Insecurity Present (04/01/2022)   Hunger Vital Sign    Worried About Running Out of Food in the Last Year: Often true    Ran Out of Food in the Last Year: Often true  Transportation Needs: Unmet Transportation Needs (04/01/2022)   PRAPARE - Hydrologist (Medical): Yes    Lack of Transportation (Non-Medical): Yes  Physical Activity: Not on file  Stress: Not on file  Social Connections: Not on file   Additional Social History:   Sleep: Good  Appetite:  Good  Current Medications: Current Facility-Administered Medications  Medication Dose Route Frequency Provider Last Rate Last Admin   albuterol (VENTOLIN HFA) 108 (90 Base) MCG/ACT inhaler 1-2 puff  1-2 puff Inhalation Q6H PRN Waylon Hershey I, NP       alum & mag hydroxide-simeth (MAALOX/MYLANTA) 200-200-20 MG/5ML suspension 30 mL  30 mL Oral Q4H PRN Nkwenti, Doris, NP       ARIPiprazole (ABILIFY) tablet 10 mg  10 mg Oral QHS Caitland Porchia I, NP   10 mg at 04/02/22 1430   cloNIDine (CATAPRES) tablet 0.1 mg  0.1 mg Oral Once Onuoha, Chinwendu V, NP       diphenhydrAMINE (BENADRYL) capsule 50 mg  50 mg Oral TID PRN Nicholes Rough, NP       Or   diphenhydrAMINE (BENADRYL) injection 50 mg  50 mg Intramuscular TID PRN Nicholes Rough, NP       docusate sodium (COLACE) capsule 100 mg  100 mg Oral BID Lindell Spar I, NP   100 mg at 04/03/22 0757   FLUoxetine (PROZAC) capsule 20 mg  20 mg Oral Daily Johnjoseph Rolfe, Herbert Pun I, NP   20 mg at 04/03/22 0757   gabapentin (NEURONTIN) capsule 400 mg  400 mg Oral TID Onuoha, Chinwendu V, NP   400 mg at 04/03/22 0757   haloperidol (HALDOL) tablet 5 mg  5 mg Oral TID PRN Nicholes Rough, NP   5 mg at 04/01/22 2038   Or   haloperidol lactate (HALDOL) injection 5 mg  5 mg Intramuscular TID PRN Nicholes Rough, NP       hydrOXYzine (ATARAX) tablet 25 mg  25 mg Oral TID PRN Nicholes Rough, NP   25 mg at 04/02/22 2137   ibuprofen (ADVIL) tablet 400 mg  400 mg Oral Q4H PRN Ranae Palms, MD   400  mg at 04/03/22 0651   insulin aspart (novoLOG) injection 0-15 Units  0-15 Units Subcutaneous TID WC Attiah, Nadir, MD   11 Units at 04/03/22 0656   insulin aspart (novoLOG) injection 0-5 Units  0-5 Units Subcutaneous QHS Onuoha, Chinwendu V, NP   3 Units at 04/02/22 2142   insulin glargine-yfgn (SEMGLEE) injection 14 Units  14 Units  Subcutaneous Q2000 Dian Situ, MD   14 Units at 04/02/22 2143   lisinopril (ZESTRIL) tablet 10 mg  10 mg Oral Daily Lindell Spar I, NP       LORazepam (ATIVAN) tablet 2 mg  2 mg Oral TID PRN Nicholes Rough, NP   2 mg at 04/01/22 2038   Or   LORazepam (ATIVAN) injection 2 mg  2 mg Intramuscular TID PRN Nicholes Rough, NP       magnesium hydroxide (MILK OF MAGNESIA) suspension 30 mL  30 mL Oral Daily PRN Nicholes Rough, NP       nicotine polacrilex (NICORETTE) gum 2 mg  2 mg Oral PRN Winfred Leeds, Nadir, MD   2 mg at 04/02/22 1431   pantoprazole (PROTONIX) EC tablet 40 mg  40 mg Oral Daily Marcella Dunnaway, Herbert Pun I, NP   40 mg at 04/03/22 0757   traZODone (DESYREL) tablet 100 mg  100 mg Oral QHS PRN Lindell Spar I, NP   100 mg at 04/02/22 2137   Lab Results:  Results for orders placed or performed during the hospital encounter of 04/01/22 (from the past 48 hour(s))  Glucose, capillary     Status: Abnormal   Collection Time: 04/01/22  5:10 PM  Result Value Ref Range   Glucose-Capillary 172 (H) 70 - 99 mg/dL    Comment: Glucose reference range applies only to samples taken after fasting for at least 8 hours.  Glucose, capillary     Status: Abnormal   Collection Time: 04/01/22  9:45 PM  Result Value Ref Range   Glucose-Capillary 338 (H) 70 - 99 mg/dL    Comment: Glucose reference range applies only to samples taken after fasting for at least 8 hours.   Comment 1 Notify RN    Comment 2 Document in Chart   Glucose, capillary     Status: Abnormal   Collection Time: 04/02/22  5:49 AM  Result Value Ref Range   Glucose-Capillary 170 (H) 70 - 99 mg/dL    Comment: Glucose reference  range applies only to samples taken after fasting for at least 8 hours.   Comment 1 Notify RN   Glucose, capillary     Status: Abnormal   Collection Time: 04/02/22 12:06 PM  Result Value Ref Range   Glucose-Capillary 286 (H) 70 - 99 mg/dL    Comment: Glucose reference range applies only to samples taken after fasting for at least 8 hours.   Comment 1 Notify RN    Comment 2 Document in Chart   Glucose, capillary     Status: Abnormal   Collection Time: 04/02/22  5:12 PM  Result Value Ref Range   Glucose-Capillary 289 (H) 70 - 99 mg/dL    Comment: Glucose reference range applies only to samples taken after fasting for at least 8 hours.  Glucose, capillary     Status: Abnormal   Collection Time: 04/02/22  9:07 PM  Result Value Ref Range   Glucose-Capillary 277 (H) 70 - 99 mg/dL    Comment: Glucose reference range applies only to samples taken after fasting for at least 8 hours.   Comment 1 Notify RN   Glucose, capillary     Status: Abnormal   Collection Time: 04/03/22  6:09 AM  Result Value Ref Range   Glucose-Capillary 313 (H) 70 - 99 mg/dL    Comment: Glucose reference range applies only to samples taken after fasting for at least 8 hours.   Comment 1 Notify RN    Comment 2 Document  in Chart    Blood Alcohol level:  Lab Results  Component Value Date   [facility]  12/11/2009    <5        LOWEST DETECTABLE LIMIT FOR SERUM ALCOHOL IS 5 mg/dL FOR MEDICAL PURPOSES ONLY   Metabolic Disorder Labs: Lab Results  Component Value Date   HGBA1C 12.7 (H) 02/01/2022   MPG 318 02/01/2022   No results found for: "PROLACTIN" Lab Results  Component Value Date   CHOL 204 (H) 02/01/2022   TRIG 61 02/01/2022   HDL 61 02/01/2022   CHOLHDL 3.3 02/01/2022   VLDL 12 02/01/2022   LDLCALC 131 (H) 02/01/2022   Physical Findings: AIMS:  , ,  ,  ,    CIWA:    COWS:     Musculoskeletal: Strength & Muscle Tone: within normal limits Gait & Station: normal Patient leans: N/A  Psychiatric  Specialty Exam:  Presentation  General Appearance:  Disheveled  Eye Contact: Fair  Speech: Clear and Coherent; Normal Rate  Speech Volume: Normal  Handedness: Right  Mood and Affect  Mood: Anxious; Depressed; Hopeless; Irritable; Worthless  Affect: Congruent; Depressed; Flat  Thought Process  Thought Processes: Coherent  Descriptions of Associations:Intact  Orientation:Full (Time, Place and Person)  Thought Content:Logical  History of Schizophrenia/Schizoaffective disorder:No data recorded Duration of Psychotic Symptoms:No data recorded Hallucinations:Hallucinations: None  Ideas of Reference:None  Suicidal Thoughts:Suicidal Thoughts: No  Homicidal Thoughts:Homicidal Thoughts: No  Sensorium  Memory: Immediate Good; Recent Good; Remote Good  Judgment: Poor  Insight: Lacking   Executive Functions  Concentration: Fair  Attention Span: Fair  Recall: Good  Fund of Knowledge: Good  Language: Good  Psychomotor Activity  Psychomotor Activity: Psychomotor Activity: -- (Irritated)  Assets  Assets: Communication Skills; Desire for Improvement; Financial Resources/Insurance; Resilience  Sleep  Sleep: Sleep: Good Number of Hours of Sleep: 6.5  Physical Exam: Physical Exam Vitals and nursing note reviewed.  HENT:     Mouth/Throat:     Pharynx: Oropharynx is clear.  Eyes:     Pupils: Pupils are equal, round, and reactive to light.  Cardiovascular:     Rate and Rhythm: Normal rate.     Pulses: Normal pulses.  Pulmonary:     Effort: Pulmonary effort is normal.  Genitourinary:    Comments: Deferred Musculoskeletal:        General: Normal range of motion.     Cervical back: Normal range of motion.  Skin:    General: Skin is warm and dry.  Neurological:     General: No focal deficit present.     Mental Status: She is alert.    Review of Systems  Constitutional:  Negative for chills, diaphoresis and fever.  HENT:  Negative for  congestion and sore throat.   Eyes:  Negative for blurred vision.  Respiratory:  Negative for cough, shortness of breath and wheezing.   Cardiovascular:  Negative for chest pain and palpitations.  Gastrointestinal:  Negative for abdominal pain, constipation, diarrhea, heartburn, nausea and vomiting.  Genitourinary:  Negative for dysuria.  Musculoskeletal:  Negative for joint pain and myalgias.  Skin:  Negative for itching and rash.  Neurological:  Negative for dizziness, tingling, tremors, sensory change, speech change, focal weakness, seizures, loss of consciousness, weakness and headaches.  Endo/Heme/Allergies:        See allergies Lists.  Psychiatric/Behavioral:  Positive for depression, memory loss and substance abuse. Negative for hallucinations and suicidal ideas (Hx Co/THC use). The patient is not nervous/anxious and does not have insomnia.  Blood pressure 121/81, pulse (!) 117, temperature 98.3 F (36.8 C), temperature source Oral, resp. rate 18, height '5\' 4"'$  (1.626 m), weight 66 kg, SpO2 100 %. Body mass index is 24.99 kg/m.  Treatment Plan Summary: Daily contact with patient to assess and evaluate symptoms and progress in treatment and Medication management.   Continue inpatient hospitalization.  Will continue today 04/03/2022 plan as below except where it is noted.   Principal/active diagnoses. Major depressive disorder, recurrent episodes without psychotic features.  Alcohol use disorder.  Stimulant use disorder (cocaine).  Cannabis use disorder.  Plan: -Continue Abilify 10 mg po daily for mood stability.  -Continue Prozac 20 mg po daily for depression.  -Continue gabapentin 400 mg po tid for neuropathic pain.  -Continue Hydroxyzine 25 mg po tid prn for anxiety.  -Continue Trazodone 50 mg po Q hs prn for insomnia.  -Continue nicorette gum 2 mg po as needed for nicotine withdrawal management.   Agitation protocols.  -Continue Benadryl 50 mg po or IM tid prn for  agitation. -Continue Haldol 5 mg po or IM tid prn for agitation.  -Continue Lorazepam 2 mg po or IM tid prn for agitation.    Other medical conditions.  -Continue albuterol inhaler 1-2 puffs Q 6 hrs prn for SOB.  -Continue colace 100 mg po bid for constipation.  -Continue gabapentin 400 mg po tid for neuropathic pain. -Continue the sliding insulin coverage for elevated blood sugar as recommended.  -Continue Lantus insulin 14 units subs Q Q hs for DM. -Continue Protonix 40 mg po Q am for acid reflux.  -Initiated Lisinopril 5 mg po once today.  -Initiated Lisinopril 10 mg po daily for HTN (start 04-02-21).   Other PRNS -Continue Tylenol 650 mg every 6 hours PRN for mild pain -Continue Maalox 30 ml Q 4 hrs PRN for indigestion -Continue MOM 30 ml po Q 6 hrs for constipation   Safety and Monitoring: Voluntary admission to inpatient psychiatric unit for safety, stabilization and treatment Daily contact with patient to assess and evaluate symptoms and progress in treatment Patient's case to be discussed in multi-disciplinary team meeting Observation Level : q15 minute checks Vital signs: q12 hours Precautions: Safety   Discharge Planning: Social work and case management to assist with discharge planning and identification of hospital follow-up needs prior to discharge Estimated LOS: 5-7 days Discharge Concerns: Need to establish a safety plan; Medication compliance and effectiveness Discharge Goals: Return home with outpatient referrals for mental health follow-up including medication management/psychotherapy  Lindell Spar, NP, pmhnp, fnp-bc 04/03/2022, 8:53 AM

## 2022-04-03 NOTE — [facility] Group Notes (Signed)
Adult Psychoeducational Group Note  Date:  04/03/2022 Time:  9:46 AM  Group Topic/Focus:  Goals Group:   The focus of this group is to help patients establish daily goals to achieve during treatment and discuss how the patient can incorporate goal setting into their daily lives to aide in recovery.  Participation Level:  Did Not Attend  Additional Comments:  Pt refused going to group after being encouraged to come.  Victorino December 04/03/2022, 9:46 AM
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
## 2022-04-03 NOTE — BHH Suicide Risk Assessment (Signed)
[facility] INPATIENT:  Family/Significant Other Suicide Prevention Education  Suicide Prevention Education:  Patient Refusal for Family/Significant Other Suicide Prevention Education: The patient Sara [name] has refused to provide written consent for family/significant other to be provided Family/Significant Other Suicide Prevention Education during admission and/or prior to discharge.  Physician notified.  Brantly Kalman S Mendy Lapinsky 04/03/2022, 1:57 PM

## 2022-04-04 DIAGNOSIS — F332 Major depressive disorder, recurrent severe without psychotic features: Principal | ICD-10-CM

## 2022-04-04 LAB — GLUCOSE, CAPILLARY: Glucose-Capillary: 214 mg/dL — ABNORMAL HIGH (ref 70–99)

## 2022-04-04 MED ORDER — INSULIN GLARGINE W/ TRANS PORT 100 UNIT/ML ~~LOC~~ SOPN: 14.0000 [IU] | PEN_INJECTOR | Freq: Every day | SUBCUTANEOUS | 0 refills | Status: DC

## 2022-04-04 MED ORDER — FLUOXETINE HCL 20 MG PO CAPS: 20.0000 mg | ORAL_CAPSULE | Freq: Every day | ORAL | 0 refills | Status: DC

## 2022-04-04 MED ORDER — TRAZODONE HCL 50 MG PO TABS: 50.0000 mg | ORAL_TABLET | Freq: Every evening | ORAL | 0 refills | Status: DC | PRN

## 2022-04-04 MED ORDER — GABAPENTIN 400 MG PO CAPS: 400.0000 mg | ORAL_CAPSULE | Freq: Three times a day (TID) | ORAL | 0 refills | Status: DC

## 2022-04-04 MED ORDER — LISINOPRIL 10 MG PO TABS: 10.0000 mg | ORAL_TABLET | Freq: Every day | ORAL | 0 refills | Status: DC

## 2022-04-04 MED ORDER — ARIPIPRAZOLE 10 MG PO TABS: 10.0000 mg | ORAL_TABLET | Freq: Every day | ORAL | 0 refills | Status: DC

## 2022-04-04 MED ORDER — OMEPRAZOLE 40 MG PO CPDR: 40.0000 mg | DELAYED_RELEASE_CAPSULE | Freq: Every day | ORAL | 0 refills | Status: DC

## 2022-04-04 MED ORDER — HYDROXYZINE HCL 25 MG PO TABS: 25.0000 mg | ORAL_TABLET | Freq: Three times a day (TID) | ORAL | 0 refills | Status: DC | PRN

## 2022-04-04 MED ORDER — INSULIN ASPART 100 UNIT/ML IJ SOLN: 5.0000 [IU] | Freq: Three times a day (TID) | INTRAMUSCULAR | 0 refills | Status: DC

## 2022-04-04 MED ORDER — NICOTINE POLACRILEX 2 MG MT GUM: 2.0000 mg | CHEWING_GUM | OROMUCOSAL | 0 refills | Status: DC | PRN

## 2022-04-04 NOTE — Progress Notes (Signed)
D: Pt denies SI/HI/AVH. Pt is pleasant and cooperative.   A: Pt was offered support and encouragement. Pt was given scheduled medications. Pt was encouraged to attend groups. Q 15 minute checks were done for safety.  R:Pt attends groups and interacts well with peers and staff. Pt is taking medication. Pt has no complaints.Pt receptive to treatment and safety maintained on unit. 

## 2022-04-04 NOTE — Progress Notes (Signed)
BHH/BMU LCSW Progress Note   04/04/2022    10:34 AM  IZZABELLA CROWL   [id number]   Type of Contact and Topic: SDOH: Transportation  Patient consented to taxi and social worker was able to get patient taxi for discharge.      Signed:  Riki Altes MSW, LCSW, LCAS 04/04/2022 10:34 AM

## 2022-04-04 NOTE — Progress Notes (Signed)
  [facility] Adult Case Management Discharge Plan :  Will you be returning to the same living situation after discharge:  No [place] At discharge, do you have transportation home?: No. Taxi Do you have the ability to pay for your medications: Yes,  Insured  Release of information consent forms completed and in the chart;  Patient's signature needed at discharge.  Patient to Follow up at:  [facility]. Go to.   Specialty: Behavioral Health Why: Please go to this provider for an assessment, to obtain therapy and medication management services on Monday through Friday, arrive no later than 7:20 am as services are first come, first served. Contact information: [place] [phone]                Next level of care provider has access to [place] and Suicide Prevention discussed: Yes with patient     Has patient been referred to the Quitline?: Patient refused referral  Patient has been referred for addiction treatment: Yes Patient to continue working towards treatment goals after discharge. Patient no longer meets criteria for inpatient criteria per attending physician. Continue taking medications as prescribed, nursing to provide instructions at discharge. Follow up with all scheduled appointments.   Timica Marcom S Deardra Hinkley, LCSW 04/04/2022, 2:10 PM

## 2022-04-04 NOTE — Transportation (Signed)
04/04/2022  Sara [name] DOB: 16-Jun-1975 MRN: [id number]   RIDER WAIVER AND RELEASE OF LIABILITY  For the purposes of helping with transportation needs, [facility] partners with outside transportation providers (taxi companies, [place], Social research officer, government.) to give [employer] patients or other approved people the choice of on-demand rides [employer]") to our buildings for non-emergency visits.  By using [employer], I, the person signing this document, on behalf of myself and/or any legal minors (in my care using the [employer]), agree:  Government social research officer given to me are supplied by independent, outside transportation providers who do not work for, or have any affiliation with, [employer]. [place] is not a transportation company. [facility] has no control over the quality or safety of the rides I get using [employer]. [facility] has no control over whether any outside ride will happen on time or not. [facility] gives no guarantee on the reliability, quality, safety, or availability on any rides, or that no mistakes will happen. I know and accept that traveling by vehicle (car, truck, SVU, Lucianne Lei, bus, taxi, etc.) has risks of serious injuries such as disability, being paralyzed, and death. I know and agree the risk of using [employer] is mine alone, and not [employer]. Transport Services are provided "as is" and as are available. The transportation providers are in charge for all inspections and care of the vehicles used to provide these rides. I agree not to take legal action against [facility], its agents, employees, officers, directors, representatives, insurers, attorneys, assigns, successors, subsidiaries, and affiliates at any time for any reasons related directly or indirectly to using [employer]. I also agree not to take legal action against [facility] or its affiliates for any injury, death, or damage to property caused by or related to using  [employer]. I have read this Waiver and Release of Liability, and I understand the terms used in it and their legal meaning. This Waiver is freely and voluntarily given with the understanding that my right (or any legal minors) to legal action against [facility] relating to [employer] is knowingly given up to use these services.   I attest that I read the Ride Waiver and Release of Liability to Sara [name], gave Ms. Kwiat the opportunity to ask questions and answered the questions asked (if any). I affirm that Sara [name] then provided consent for assistance with transportation.

## 2022-04-04 NOTE — Progress Notes (Signed)
Patient educated about follow up care, upcoming appointments reviewed. Patient verbalizes understanding of all follow up appointments. AVS and suicide safety plan reviewed. Patient expresses no concerns or questions at this time. Educated on prescriptions and medication regimen. Patient belongings returned. Patient denies SI, HI, AVH at this time. Educated patient about suicide help resources and hotline, encouraged to call for assistance in the event of a crisis. Patient agrees. Patient is ambulatory and safe at time of discharge. Patient discharged to hospital lobby at this time.

## 2022-04-04 NOTE — Discharge Summary (Signed)
Physician Discharge Summary Note  Patient:  Sara [name] is an 47 y.o., female  MRN:  [id number]  DOB:  10-11-1975  Patient phone:  [phone] (home)   Patient address:   [place] [address],   Total Time spent with patient:  Greater than 30 minutes.  Date of Admission:  04/01/2022  Date of Discharge: 04-04-22  Reason for Admission: Worsening symptoms of depression/suicidal ideations with an attempts to overdose on medications that failed.  Principal Problem: MDD (major depressive disorder), recurrent episode, severe ([place])  Discharge Diagnoses: Principal Problem:   MDD (major depressive disorder), recurrent episode, severe ([place]) Active Problems:   MDD (major depressive disorder)  Past Psychiatric History: Previous Psych Diagnoses: schizoaffective disorder bipolar type, currently depressed Prior inpatient treatment: Yes, patient reports admission to [facility] behavioral health in 2023, [place] [place] [facility] and [facility] about 12 years ago. Current/prior outpatient treatment: Patient denies Prior rehab hx: Patient denies Psychotherapy hx: Yes History of suicide: Suicide attempt x 2, first attempt was in 2000, second attempt 2006.  On both instances patient attempted to OD. 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 pylori infection    High risk medication use     Hypercholesterolemia    Hyperlipidemia, mixed    Hypertension    Other mixed anxiety disorders    Pollen allergies    Sickle cell trait ([place])    Sleep apnea    Vitamin D deficiency     Past Surgical History:  Procedure Laterality Date   ABDOMINAL HYSTERECTOMY     APPENDECTOMY     COLONOSCOPY  08-16-2008   Dr. Ladean Raya   ESOPHAGOGASTRODUODENOSCOPY  08-16-2008   Dr. Orlena Sheldon    Family History:  Family History  Problem Relation Age of Onset   Clotting disorder Mother    Crohn's disease Mother    Heart disease Mother    Colon cancer Father    Clotting disorder Brother    Diabetes Brother    Diabetes Maternal Aunt    Liver cancer Maternal Uncle    Prostate cancer Maternal Uncle    Colon cancer Maternal Uncle    Diabetes Paternal Aunt    Esophageal cancer Paternal Uncle    Family Psychiatric  History:  Medical: Family has history of cancer, MI, diabetes Psych: Mother has manic depressive disorder and attempted suicide Psych Rx: Patient does not remember when his SA/HA: Cousin attempted suicide Substance use family hx: History of alcoholism in the family.  Social History:  Social History   Substance and Sexual Activity  Alcohol Use None     Social History   Substance and Sexual Activity  Drug Use Yes   Types: Marijuana    Social History   Socioeconomic History   Marital status: Married    Spouse name: Not on file   Number of children: 2   Years of education: Not on file   Highest education level: Not  on file  Occupational History   Occupation: n/a  Tobacco Use   Smoking status: Every Day    Packs/day: 1.00    Types: Cigarettes   Smokeless tobacco: Never  Substance and Sexual Activity   Alcohol use: Not on file   Drug use: Yes    Types: Marijuana   Sexual activity: Not on file  Other Topics Concern   Not on file  Social History Narrative   Not on file   Social Determinants of Health   Financial Resource Strain: Not on file  Food  Insecurity: Food Insecurity Present (04/01/2022)   Hunger Vital Sign    Worried About Running Out of Food in the Last Year: Often true    Ran Out of Food in the Last Year: Often true  Transportation Needs: Unmet Transportation Needs (04/01/2022)   PRAPARE - Hydrologist (Medical): Yes    Lack of Transportation (Non-Medical): Yes  Physical Activity: Not on file  Stress: Not on file  Social Connections: Not on file   Hospital Course: (Per admission evaluation notes): This is the second psychiatric hospital in this [facility] since January of this year, 2024 for this 47 year old AA female with hx of chronic depression, chronic substance use disorders & chronic homelessness. She was admitted, treated, stabilized in this hospital last January of 2024. She was stabilized & discharged on medications with outpatient psychiatric services to maintain mood stability. Patient is being admitted to the [facility] this time around from the [facility] with complaint of worsening symptoms of depression/suicidal ideations with an attempts to overdose on medications that failed. After medical evaluation & stabilization, Kendralyn was transferred to the [facility] for further psychiatric evaluations/treatments. A review of her toxicology reports showed a positive cocaine & THC.   Prior to this discharge, Johnsie was seen & evaluated for mental health stability. The current laboratory findings were reviewed (stable), nurses notes & vital signs were reviewed as well. There are no current mental health or medical issues that should prevent this discharge at this time. Patient is being discharged to continue mental health care as noted below.   This is Lakea's second psychiatric admission/discharge summaries from this [facility] in a very short amount of time. She was admitted admitted to the [facility], treated & stabilized in this [facility] last January 2024. She was discharged with an outpatient psychiatric services. However, patient  reported on this present admission that she did neither take her medications as recommended or follow -up with her outpatient appointments. This time around, she was admitted for worsening symptoms of depression & attempted suicide that was unsuccessful. She was recommended for mood stabilization treatments by her treatment team after her admission evaluation. And with her consent Lenae received, stabilized & was discharged on the medications as listed below on her discharge medication lists. She was also enrolled & participated in the group counseling sessions being offered & held on this unit. She learned coping skills. She presented other significant pre-existing medical conditions that required treatment or monitoring. She treated & discharged on the medications used for those health issues.  She tolerated her treatment regimen without any adverse effects or reactions reported.  Rayni's symptoms responded well to her treatment regimen warranting this discharge. During the course of her hospitalization, the 15-minute checks were adequate to ensure her safety. Patient did not display any dangerous, violent or suicidal behavior on the unit. She interacted with patients & staff appropriately. She participated appropriately in the group sessions/therapies.  Her medications were addressed & adjusted to meet her needs. She was recommended for an outpatient follow-up care & medication management upon discharge to assure her continuity of care.  At the time of discharge, patient is not reporting any acute suicidal/homicidal ideations. She feels more confident about her self & mental health care. She currently denies any new issues or concerns. Education and supportive counseling provided throughout her hospital stay & upon discharge.   Today upon her discharge evaluation with her treatment team, Jurnee shares she is doing well. She denies any other specific concerns. She is sleeping well. Her appetite is good. She  denies other physical complaints. She denies AH/VH, delusional thoughts or paranoia. She feels that her medications have been helpful & is in agreement to continue her current treatment regimen as recommended. She was able to engage in safety planning including plan to return to [facility] or contact emergency services if she feels unable to maintain his own safety or the safety of others. Pt had no further questions, comments, or concerns. She left [facility] with all personal belongings in no apparent distress. Transportation per taxi. [place] assisted with taxi fare.   Sleep  Sleep:Sleep: Good Number of Hours of Sleep: 8  Physical Findings: AIMS:  , ,  ,  ,    CIWA:    COWS:     Musculoskeletal: Strength & Muscle Tone: within normal limits Gait & Station: normal Patient leans: N/A   Psychiatric Specialty Exam:  General Appearance: appears at stated age, fairly dressed and groomed  Behavior: pleasant and cooperative  Psychomotor Activity: No psychomotor agitation or retardation noted   Eye Contact: good Speech: normal amount, tone, volume and latency  Mood: euthymic Affect: congruent, pleasant and interactive  Thought Process: linear, goal directed, no circumstantial or tangential thought process noted, no racing thoughts or flight of ideas Descriptions of Associations: intact Thought Content: Hallucinations: denies AH, VH , does not appear responding to stimuli Delusions: No paranoia or other delusions noted Suicidal Thoughts: denies SI, intention, plan  Homicidal Thoughts: denies HI, intention, plan   Alertness/Orientation: alert and fully oriented  Insight: fair, improved Judgment: fair, improved  Memory: intact  Executive Functions  Concentration: intact  Attention Span: Fair Recall: intact Fund of Knowledge: fair  Assets  Assets: Armed forces logistics/support/administrative officer; Desire for Improvement; Financial Resources/Insurance; Housing; Resilience; Social Support  Sleep Sleep: Sleep:  Good Number of Hours of Sleep: 8  Physical Exam:  Physical Exam Vitals and nursing note reviewed.  Constitutional:      Appearance: Normal appearance.  HENT:     Head: Normocephalic and atraumatic.     Mouth/Throat:     Pharynx: Oropharynx is clear.  Eyes:     Pupils: Pupils are equal, round, and reactive to light.  Cardiovascular:     Rate and Rhythm: Normal rate.  Pulmonary:     Effort: Pulmonary effort is normal.  Genitourinary:    Comments: Deferred Musculoskeletal:        General: Normal range of motion.     Cervical back: Normal range of motion.  Skin:    General: Skin is warm and dry.  Neurological:     General: No focal deficit present.     Mental Status: She is alert and oriented to person, place, and time. Mental status is at baseline.  Psychiatric:        Mood and Affect: Mood normal.        Behavior: Behavior normal.        Thought  Content: Thought content normal.        Judgment: Judgment normal.    Review of Systems  Constitutional:  Negative for chills, diaphoresis and fever.  HENT:  Negative for congestion and sore throat.   Eyes:  Negative for blurred vision.  Respiratory:  Negative for cough, shortness of breath and wheezing.   Cardiovascular:  Negative for chest pain and palpitations.  Gastrointestinal:  Negative for abdominal pain, constipation, diarrhea, heartburn, nausea and vomiting.  Genitourinary:  Negative for dysuria.  Musculoskeletal:  Negative for joint pain and myalgias.  Skin:  Negative for itching and rash.  Neurological:  Negative for dizziness, tingling, tremors, sensory change, speech change, focal weakness, seizures, loss of consciousness, weakness and headaches.  Endo/Heme/Allergies:        See allergy lists.  Psychiatric/Behavioral:  Positive for depression (Hx. of (stable on medication).) and substance abuse (Hx. THC/cocaine use disorders.). Negative for hallucinations, memory loss and suicidal ideas. The patient is not  nervous/anxious (Stable upon discharge) and does not have insomnia.   All other systems reviewed and are negative.  Blood pressure 114/85, pulse (!) 120, temperature 98.2 F (36.8 C), temperature source Oral, resp. rate 16, height '5\' 4"'$  (1.626 m), weight 66 kg, SpO2 98 %. Body mass index is 24.99 kg/m.  Social History   Tobacco Use  Smoking Status Every Day   Packs/day: 1.00   Types: Cigarettes  Smokeless Tobacco Never   Tobacco Cessation:  An FDA-approved tobacco cessation medication recommended at discharge  Blood Alcohol level:  Lab Results  Component Value Date   [facility]  12/11/2009    <5        LOWEST DETECTABLE LIMIT FOR SERUM ALCOHOL IS 5 mg/dL FOR MEDICAL PURPOSES ONLY   Metabolic Disorder Labs:  Lab Results  Component Value Date   HGBA1C 12.7 (H) 02/01/2022   MPG 318 02/01/2022   No results found for: "PROLACTIN" Lab Results  Component Value Date   CHOL 204 (H) 02/01/2022   TRIG 61 02/01/2022   HDL 61 02/01/2022   CHOLHDL 3.3 02/01/2022   VLDL 12 02/01/2022   LDLCALC 131 (H) 02/01/2022   See Psychiatric Specialty Exam and Suicide Risk Assessment completed by Attending Physician prior to discharge.  Discharge destination:  Other:  [place]  Is patient on multiple antipsychotic therapies at discharge:  No   Has Patient had three or more failed trials of antipsychotic monotherapy by history:  No  Recommended Plan for Multiple Antipsychotic Therapies: NA  Allergies as of 04/04/2022       Reactions   Doxycycline Anaphylaxis   Penicillins Anaphylaxis   Tetracyclines & Related Anaphylaxis   Tylenol [acetaminophen] Itching   Rx with Tylenol Elixir, suppose due to dye? Can take tablets   Clarithromycin Itching, Rash   Metronidazole Itching, Rash        Medication List     TAKE these medications      Indication  acetaminophen 500 MG tablet Commonly known as: TYLENOL Take 1,000 mg by mouth every 4 (four) hours as needed for mild pain or moderate  pain (alternating with Ibuprofen).  Indication: Fever, Pain   albuterol 108 (90 Base) MCG/ACT inhaler Commonly known as: VENTOLIN HFA Inhale 1-2 puffs into the lungs every 6 (six) hours as needed for wheezing.  Indication: Asthma   ARIPiprazole 10 MG tablet Commonly known as: ABILIFY Take 1 tablet (10 mg total) by mouth at bedtime. For mood stabilization. What changed:  medication strength how much to take additional instructions  Indication: Major Depressive Disorder, Mood stabilization.   docusate sodium 100 MG capsule Commonly known as: COLACE Take 1 capsule (100 mg total) by mouth 2 (two) times daily.  Indication: Constipation   FLUoxetine 20 MG capsule Commonly known as: PROZAC Take 1 capsule (20 mg total) by mouth daily. For depression What changed: additional instructions  Indication: Major Depressive Disorder   gabapentin 400 MG capsule Commonly known as: NEURONTIN Take 1 capsule (400 mg total) by mouth 3 (three) times daily. For anxiety/pain What changed: additional instructions  Indication: Diabetes with Nerve Disease, Anxiety   hydrOXYzine 25 MG tablet Commonly known as: ATARAX Take 1 tablet (25 mg total) by mouth 3 (three) times daily as needed for anxiety.  Indication: Feeling Anxious   ibuprofen 200 MG tablet Commonly known as: ADVIL Take 800 mg by mouth every 8 (eight) hours as needed for mild pain or moderate pain. Alternating with Tylenol  Indication: Fever, Pain   insulin aspart 100 UNIT/ML injection Commonly known as: novoLOG Inject 5 Units into the skin 3 (three) times daily with meals. For diabetes management What changed: additional instructions  Indication: Type 2 Diabetes   Insulin Glargine w/ Trans Port 100 UNIT/ML Sopn Inject 14 Units into the skin daily. For diabetes management What changed: additional instructions  Indication: Type 2 Diabetes   lisinopril 10 MG tablet Commonly known as: ZESTRIL Take 1 tablet (10 mg total) by mouth  daily. For high blood pressure Start taking on: April 05, 2022  Indication: High Blood Pressure Disorder   nicotine polacrilex 2 MG gum Commonly known as: NICORETTE Take 1 each (2 mg total) by mouth as needed. (May buy from over the counter): For smoking cessation  Indication: Nicotine Addiction   omeprazole 40 MG capsule Commonly known as: PRILOSEC Take 1 capsule (40 mg total) by mouth daily. For acid rflux What changed: additional instructions  Indication: Gastroesophageal Reflux Disease   traZODone 50 MG tablet Commonly known as: DESYREL Take 1 tablet (50 mg total) by mouth at bedtime as needed for sleep. What changed:  medication strength how much to take  Indication: [place]. Go to.   Specialty: Behavioral Health Why: Please go to this provider for an assessment, to obtain therapy and medication management services on Monday through Friday, arrive no later than 7:20 am as services are first come, first served. Contact information: [place] [phone]               Discharge recommendations: Activity:  As tolerated Diet: As recommended by your primary care doctor. Keep all scheduled follow-up appointments as recommended.   Comments: Comments: Patient is recommended to follow-up care on an outpatient basis as noted above. Prescriptions sent to pt's pharmacy of choice at discharge.   Patient agreeable to plan.   Given opportunity to ask questions.   Appears to feel comfortable with discharge denies any current suicidal or homicidal thought. Patient is also instructed prior to discharge to: Take all medications as prescribed by his/her mental healthcare provider. Report any adverse effects and or reactions from the medicines to his/her outpatient provider promptly. Patient has been instructed & cautioned: To not engage in alcohol and or  illegal drug use while on prescription medicines. In the event of worsening symptoms, patient is instructed to call the crisis hotline, 911 and or go to the nearest ED for appropriate evaluation  and treatment of symptoms. To follow-up with his/her primary care provider for your other medical issues, concerns and or health care needs.   Signed: Lindell Spar, NP, pmhnp, fnp-bc. 04/04/2022, 10:57 AM

## 2022-04-04 NOTE — Progress Notes (Signed)
  [facility] Adult Case Management Discharge Plan :  Will you be returning to the same living situation after discharge:  No. Patient going to [place] At discharge, do you have transportation home?: Yes,  taxi through hospital Do you have the ability to pay for your medications: Yes,  insurance  Release of information consent forms completed and in the chart;  Patient's signature needed at discharge.  Patient to Follow up at:  [place]. Go to.   Specialty: Behavioral Health Why: Please go to this provider for an assessment, to obtain therapy and medication management services on Monday through Friday, arrive no later than 7:20 am as services are first come, first served. Contact information: [place] [phone]                Next level of care provider has access to [place] and Suicide Prevention discussed: Yes,  safety planning done with patient. Patient did not consent to speak to social support. Physician notified     Has patient been referred to the Quitline?: Patient refused referral  Patient has been referred for addiction treatment: Pt. refused referral  [place], LCSW 04/04/2022, 10:31 AM

## 2022-04-04 NOTE — BHH Suicide Risk Assessment (Signed)
Suicide Risk Assessment  Discharge Assessment    [facility] Discharge Suicide Risk Assessment   Principal Problem: MDD (major depressive disorder), recurrent episode, severe ([place]) Discharge Diagnoses: Principal Problem:   MDD (major depressive disorder), recurrent episode, severe ([place]) Active Problems:   MDD (major depressive disorder)  Total Time spent with patient:  Greater than 30 minutes  Musculoskeletal: Strength & Muscle Tone: within normal limits Gait & Station: normal Patient leans: N/A  Psychiatric Specialty Exam  Presentation  General Appearance:  Appropriate for Environment; Casual; Fairly Groomed  Eye Contact: Good  Speech: Clear and Coherent; Normal Rate  Speech Volume: Normal  Handedness: Right  Mood and Affect  Mood: Euthymic  Duration of Depression Symptoms: No data recorded Affect: Appropriate; Congruent  Thought Process  Thought Processes: Coherent; Goal Directed; Linear  Descriptions of Associations:Intact  Orientation:Full (Time, Place and Person)  Thought Content:Logical  History of Schizophrenia/Schizoaffective disorder:No data recorded Duration of Psychotic Symptoms:No data recorded Hallucinations:Hallucinations: None  Ideas of Reference:None  Suicidal Thoughts:Suicidal Thoughts: No  Homicidal Thoughts:Homicidal Thoughts: No  Sensorium  Memory: Immediate Good; Recent Good; Remote Good  Judgment: Good  Insight: Good  Executive Functions  Concentration: Good  Attention Span: Good  Recall: Good  Fund of Knowledge: Good  Language: Good  Psychomotor Activity  Psychomotor Activity: Psychomotor Activity: Normal  Assets  Assets: Communication Skills; Desire for Improvement; Financial Resources/Insurance; Housing; Resilience; Social Support  Sleep  Sleep: Sleep: Good Number of Hours of Sleep: 8  Physical Exam: See H&P.  Blood pressure 114/85, pulse (!) 120, temperature 98.2 F (36.8 C), temperature  source Oral, resp. rate 16, height '5\' 4"'$  (1.626 m), weight 66 kg, SpO2 98 %. Body mass index is 24.99 kg/m.  Mental Status Per Nursing Assessment::   On Admission:  Suicidal ideation indicated by patient, Self-harm behaviors  Demographic Factors:  Adolescent or young adult, Low socioeconomic status, and Collects social security benefit.  Loss Factors: Financial problems/change in socioeconomic status  Historical Factors: Prior suicide attempts, Family history of mental illness or substance abuse, and Impulsivity  Risk Reduction Factors:   Living with another person, especially a relative, Positive social support, Positive therapeutic relationship, and Positive coping skills or problem solving skills  Continued Clinical Symptoms:  Depression:   Impulsivity Alcohol/Substance Abuse/Dependencies More than one psychiatric diagnosis Previous Psychiatric Diagnoses and Treatments Medical Diagnoses and Treatments/Surgeries  Cognitive Features That Contribute To Risk:  Closed-mindedness, Polarized thinking, and Thought constriction (tunnel vision)    Suicide Risk:  Minimal: No identifiable suicidal ideation.  Patients presenting with no risk factors but with morbid ruminations; may be classified as minimal risk based on the severity of the depressive symptoms   Follow-up [place]. Go to.   Specialty: Behavioral Health Why: Please go to this provider for an assessment, to obtain therapy and medication management services on Monday through Friday, arrive no later than 7:20 am as services are first come, first served. Contact information: [place] [phone]               Plan Of Care/Follow-up recommendations:  See the discharge recommendation above.  Lindell Spar, NP, pmhnp, fnp-bc. 04/04/2022, 10:38 AM

## 2022-04-13 ENCOUNTER — Emergency Department (HOSPITAL_COMMUNITY): Payer: Medicaid Other

## 2022-04-13 ENCOUNTER — Emergency Department (HOSPITAL_COMMUNITY)
Admission: EM | Admit: 2022-04-13 | Discharge: 2022-04-13 | Disposition: A | Discharge status: Home / Self Care | Payer: Medicaid Other | Attending: Emergency Medicine | Admitting: Emergency Medicine

## 2022-04-13 ENCOUNTER — Other Ambulatory Visit: Payer: Self-pay

## 2022-04-13 DIAGNOSIS — Z79899 Other long term (current) drug therapy: Secondary | ICD-10-CM | POA: Diagnosis not present

## 2022-04-13 DIAGNOSIS — Z794 Long term (current) use of insulin: Secondary | ICD-10-CM | POA: Diagnosis not present

## 2022-04-13 DIAGNOSIS — R1084 Generalized abdominal pain: Secondary | ICD-10-CM | POA: Insufficient documentation

## 2022-04-13 DIAGNOSIS — E119 Type 2 diabetes mellitus without complications: Secondary | ICD-10-CM | POA: Diagnosis not present

## 2022-04-13 DIAGNOSIS — M5441 Lumbago with sciatica, right side: Secondary | ICD-10-CM | POA: Insufficient documentation

## 2022-04-13 DIAGNOSIS — G8929 Other chronic pain: Secondary | ICD-10-CM | POA: Diagnosis not present

## 2022-04-13 DIAGNOSIS — M545 Low back pain, unspecified: Secondary | ICD-10-CM | POA: Diagnosis present

## 2022-04-13 DIAGNOSIS — I10 Essential (primary) hypertension: Secondary | ICD-10-CM | POA: Diagnosis not present

## 2022-04-13 LAB — URINALYSIS, ROUTINE W REFLEX MICROSCOPIC
Bacteria, UA: NONE SEEN
Bilirubin Urine: NEGATIVE
Glucose, UA: >=500 mg/dL — AB
Hgb urine dipstick: NEGATIVE
Ketones, ur: NEGATIVE mg/dL
Leukocytes,Ua: NEGATIVE
Nitrite: NEGATIVE
Protein, ur: NEGATIVE mg/dL
Specific Gravity, Urine: 1.017 (ref 1.005–1.030)
pH: 5 (ref 5.0–8.0)

## 2022-04-13 LAB — BASIC METABOLIC PANEL
Anion gap: 8 (ref 5–15)
BUN: 10 mg/dL (ref 6–20)
CO2: 25 mmol/L (ref 22–32)
Calcium: 9.1 mg/dL (ref 8.9–10.3)
Chloride: 103 mmol/L (ref 98–111)
Creatinine, Ser: 0.7 mg/dL (ref 0.44–1.00)
GFR, Estimated: >60 mL/min (ref >60)
Glucose, Bld: 235 mg/dL — ABNORMAL HIGH (ref 70–99)
Potassium: 3.4 mmol/L — ABNORMAL LOW (ref 3.5–5.1)
Sodium: 136 mmol/L (ref 135–145)

## 2022-04-13 LAB — CBC WITH DIFFERENTIAL/PLATELET
Abs Immature Granulocytes: 0.02 10*3/uL (ref 0.00–0.07)
Basophils Absolute: 0.1 10*3/uL (ref 0.0–0.1)
Basophils Relative: 1 %
Eosinophils Absolute: 0.1 10*3/uL (ref 0.0–0.5)
Eosinophils Relative: 1 %
HCT: 40.4 % (ref 36.0–46.0)
Hemoglobin: 13.6 g/dL (ref 12.0–15.0)
Immature Granulocytes: 0 %
Lymphocytes Relative: 45 %
Lymphs Abs: 3.9 10*3/uL (ref 0.7–4.0)
MCH: 29.2 pg (ref 26.0–34.0)
MCHC: 33.7 g/dL (ref 30.0–36.0)
MCV: 86.9 fL (ref 80.0–100.0)
Monocytes Absolute: 0.5 10*3/uL (ref 0.1–1.0)
Monocytes Relative: 5 %
Neutro Abs: 4.2 10*3/uL (ref 1.7–7.7)
Neutrophils Relative %: 48 %
Platelets: 189 10*3/uL (ref 150–400)
RBC: 4.65 MIL/uL (ref 3.87–5.11)
RDW: 12.1 % (ref 11.5–15.5)
WBC: 8.7 10*3/uL (ref 4.0–10.5)
nRBC: 0 % (ref 0.0–0.2)

## 2022-04-13 LAB — HEPATIC FUNCTION PANEL
ALT: 14 U/L (ref 0–44)
AST: 13 U/L — ABNORMAL LOW (ref 15–41)
Albumin: 4.1 g/dL (ref 3.5–5.0)
Alkaline Phosphatase: 55 U/L (ref 38–126)
Bilirubin, Direct: <0.1 mg/dL (ref 0.0–0.2)
Total Bilirubin: 0.4 mg/dL (ref 0.3–1.2)
Total Protein: 7.5 g/dL (ref 6.5–8.1)

## 2022-04-13 LAB — LIPASE, BLOOD: Lipase: 22 U/L (ref 11–51)

## 2022-04-13 MED ORDER — ONDANSETRON HCL 4 MG/2ML IJ SOLN
4.0000 mg | Freq: Once | INTRAMUSCULAR | Status: AC
  Administered 2022-04-13: 4 mg via INTRAVENOUS
  Filled 2022-04-13: qty 2

## 2022-04-13 MED ORDER — KETOROLAC TROMETHAMINE 15 MG/ML IJ SOLN
15.0000 mg | Freq: Once | INTRAMUSCULAR | Status: AC
  Administered 2022-04-13: 15 mg via INTRAVENOUS
  Filled 2022-04-13: qty 1

## 2022-04-13 MED ORDER — SODIUM CHLORIDE 0.9 % IV BOLUS
1000.0000 mL | Freq: Once | INTRAVENOUS | Status: AC
  Administered 2022-04-13: 1000 mL via INTRAVENOUS

## 2022-04-13 MED ORDER — MORPHINE SULFATE (PF) 4 MG/ML IV SOLN
4.0000 mg | Freq: Once | INTRAVENOUS | Status: AC
  Administered 2022-04-13: 4 mg via INTRAVENOUS
  Filled 2022-04-13: qty 1

## 2022-04-13 NOTE — ED Provider Triage Note (Signed)
Emergency Medicine Provider Triage Evaluation Note  KALIYA REDDIN , a 47 y.o. female  was evaluated in triage.  Pt complains of right-sided back pain described as burning, worse with palpation.  Patient also complains of dysuria with intermittent urinary "retention".  She states that symptoms began almost a year ago.  They worsened over the past 2 to 3 weeks.  She denies nausea, vomiting, other abdominal pain, chest pain, shortness of breath..  Review of Systems  Positive: As above Negative: As above  Physical Exam  BP 126/86 (BP Location: Right Arm)   Pulse (!) 104   Temp 98.2 F (36.8 C) (Oral)   Resp 18   SpO2 94%  Gen:   Awake, patient appears uncomfortable Resp:  Normal effort  MSK:   Moves extremities without difficulty  Other:  Right flank TTP  Medical Decision Making  Medically screening exam initiated at 4:24 PM.  Appropriate orders placed.  ANNICK PINGITORE was informed that the remainder of the evaluation will be completed by another provider, this initial triage assessment does not replace that evaluation, and the importance of remaining in the ED until their evaluation is complete.     Dorothyann Peng, PA-C 04/13/22 1625

## 2022-04-13 NOTE — ED Provider Notes (Signed)
[place] EMERGENCY DEPARTMENT AT [facility] Provider Note   CSN: [id number] Arrival date & time: 04/13/22  [id number]     History  Chief Complaint  Patient presents with   Back Pain    Sara [name] is a 47 y.o. female. With a history of insulin dependent type 2 diabetes, hypertension, chronic pain, depression who presents to the ED for evaluation of right sided low back pain with radiation into the right upper quadrant. Symptoms has been present for approximately nearly one week, but worsened 2 to 3 weeks ago. Has tried tylenol and ibuprofen at home with minimal relief. Describes it as a burning sensation. Reports neuropathy with no recent change in numbness. No weakness. No saddle paresthesias, bowel or bladder dysfunction, fevers, injection drug use. Reports nausea but denies vomiting or diarrhea. States she was diagnosed with fatty liver disease recently. Reports urinary frequency but denies painful urination, hematuria or urgency. Has been taking tylenol and ibuprofen with relief for a few hours but the pain returns.  Also reports she has run out of her insulin and is unable to get a refill due to cost and homelessness. She does not believe she will be able to get it filled until next month. She does have a prescription that she can fill.   Back Pain Associated symptoms: abdominal pain        Home Medications Prior to Admission medications   Medication Sig Start Date End Date Taking? Authorizing Provider  acetaminophen (TYLENOL) 500 MG tablet Take 1,000 mg by mouth every 4 (four) hours as needed for mild pain or moderate pain (alternating with Ibuprofen).    [provider]  albuterol (VENTOLIN HFA) 108 (90 Base) MCG/ACT inhaler Inhale 1-2 puffs into the lungs every 6 (six) hours as needed for wheezing. 02/09/19   [provider]  ARIPiprazole (ABILIFY) 10 MG tablet Take 1 tablet (10 mg total) by mouth at bedtime. For mood stabilization. 04/04/22   Lindell Spar I, NP  docusate sodium (COLACE) 100 MG capsule Take 1 capsule (100 mg total) by mouth 2 (two) times daily. 02/07/22   Dian Situ, MD  FLUoxetine (PROZAC) 20 MG capsule Take 1 capsule (20 mg total) by mouth daily. For depression 04/04/22   Lindell Spar I, NP  gabapentin (NEURONTIN) 400 MG capsule Take 1 capsule (400 mg total) by mouth 3 (three) times daily. For anxiety/pain 04/04/22   Lindell Spar I, NP  hydrOXYzine (ATARAX) 25 MG tablet Take 1 tablet (25 mg total) by mouth 3 (three) times daily as needed for anxiety. 04/04/22   Lindell Spar I, NP  ibuprofen (ADVIL) 200 MG tablet Take 800 mg by mouth every 8 (eight) hours as needed for mild pain or moderate pain. Alternating with Tylenol    [provider]  insulin aspart (NOVOLOG) 100 UNIT/ML injection Inject 5 Units into the skin 3 (three) times daily with meals. For diabetes management 04/04/22   Lindell Spar I, NP  Insulin Glargine w/ Trans Port 100 UNIT/ML SOPN Inject 14 Units into the skin daily. For diabetes management 04/04/22   Lindell Spar I, NP  lisinopril (ZESTRIL) 10 MG tablet Take 1 tablet (10 mg total) by mouth daily. For high blood pressure 04/05/22   Lindell Spar I, NP  nicotine polacrilex (NICORETTE) 2 MG gum Take 1 each (2 mg total) by mouth as needed. (May buy from over the counter): For smoking cessation 04/04/22   Lindell Spar I, NP  omeprazole (PRILOSEC) 40 MG capsule Take  1 capsule (40 mg total) by mouth daily. For acid rflux 04/04/22   Lindell Spar I, NP  traZODone (DESYREL) 50 MG tablet Take 1 tablet (50 mg total) by mouth at bedtime as needed for sleep. 04/04/22   Lindell Spar I, NP      Allergies    Doxycycline, Penicillins, Tetracyclines & related, Tylenol [acetaminophen], Clarithromycin, and Metronidazole    Review of Systems   Review of Systems  Gastrointestinal:  Positive for abdominal pain and nausea.  Musculoskeletal:  Positive for back pain.  All other systems reviewed and are negative.   Physical  Exam Updated Vital Signs BP (!) 163/91   Pulse 96   Temp 98.2 F (36.8 C) (Oral)   Resp 19   SpO2 100%  Physical Exam Vitals and nursing note reviewed.  Constitutional:      General: She is in acute distress.     Appearance: She is well-developed. She is not ill-appearing, toxic-appearing or diaphoretic.  HENT:     Head: Normocephalic and atraumatic.  Eyes:     Conjunctiva/sclera: Conjunctivae normal.  Cardiovascular:     Rate and Rhythm: Normal rate and regular rhythm.     Heart sounds: No murmur heard. Pulmonary:     Effort: Pulmonary effort is normal. No respiratory distress.     Breath sounds: Normal breath sounds.  Abdominal:     Palpations: Abdomen is soft.     Tenderness: There is generalized abdominal tenderness. There is right CVA tenderness. There is no left CVA tenderness.  Musculoskeletal:        General: No swelling.     Cervical back: Neck supple.     Comments: Straight leg raise positive on the right, negative on the left. Paraspinal TTP in the right lumbar region  Skin:    General: Skin is warm and dry.     Capillary Refill: Capillary refill takes less than 2 seconds.  Neurological:     Mental Status: She is alert.  Psychiatric:        Mood and Affect: Mood normal.     ED Results / Procedures / Treatments   Labs (all labs ordered are listed, but only abnormal results are displayed) Labs Reviewed  BASIC METABOLIC PANEL - Abnormal; Notable for the following components:      Result Value   Potassium 3.4 (*)    Glucose, Bld 235 (*)    All other components within normal limits  URINALYSIS, ROUTINE W REFLEX MICROSCOPIC - Abnormal; Notable for the following components:   Glucose, UA >=500 (*)    All other components within normal limits  HEPATIC FUNCTION PANEL - Abnormal; Notable for the following components:   AST 13 (*)    All other components within normal limits  CBC WITH DIFFERENTIAL/PLATELET  LIPASE, BLOOD    EKG None  Radiology US Abdomen  Limited RUQ (LIVER/GB)  Result Date: 04/13/2022 CLINICAL DATA:  1 week history of pain. EXAM: ULTRASOUND ABDOMEN LIMITED RIGHT UPPER QUADRANT COMPARISON:  CT scan from earlier same day FINDINGS: Gallbladder: No gallstones or wall thickening visualized. No sonographic Murphy sign noted by sonographer. Common bile duct: Diameter: 5 mm.  No intrahepatic biliary duct dilatation. Liver: No focal lesion identified. Within normal limits in parenchymal echogenicity. Portal vein is patent on color Doppler imaging with normal direction of blood flow towards the liver. Other: None. IMPRESSION: Unremarkable right upper quadrant abdominal ultrasound. Electronically Signed   By: Misty Stanley M.D.   On: 04/13/2022 19:04   CT Renal  Stone Study  Result Date: 04/13/2022 CLINICAL DATA:  Flank pain.  Kidney stones suspected. EXAM: CT ABDOMEN AND PELVIS WITHOUT CONTRAST TECHNIQUE: Multidetector CT imaging of the abdomen and pelvis was performed following the standard protocol without IV contrast. RADIATION DOSE REDUCTION: This exam was performed according to the departmental dose-optimization program which includes automated exposure control, adjustment of the mA and/or kV according to patient size and/or use of iterative reconstruction technique. COMPARISON:  03/03/2022 FINDINGS: Lower chest: Unremarkable. Hepatobiliary: No suspicious focal abnormality in the liver on this study without intravenous contrast. There is no evidence for gallstones, gallbladder wall thickening, or pericholecystic fluid. No intrahepatic or extrahepatic biliary dilation. Pancreas: No focal mass lesion. No dilatation of the main duct. No intraparenchymal cyst. No peripancreatic edema. Spleen: No splenomegaly. No focal mass lesion. Adrenals/Urinary Tract: No adrenal nodule or mass. Kidneys unremarkable. No evidence for hydroureter. Bladder is nondistended although there is apparent circumferential bladder wall thickening. Stomach/Bowel: Stomach is  moderately distended with food. Duodenum is normally positioned as is the ligament of Treitz. No small bowel wall thickening. No small bowel dilatation. The terminal ileum is normal. The appendix is not well visualized, but there is no edema or inflammation in the region of the cecum. No gross colonic mass. No colonic wall thickening. Vascular/Lymphatic: There is mild atherosclerotic calcification of the abdominal aorta without aneurysm. There is no gastrohepatic or hepatoduodenal ligament lymphadenopathy. No retroperitoneal or mesenteric lymphadenopathy. No pelvic sidewall lymphadenopathy. Reproductive: Hysterectomy.  There is no adnexal mass. Other: No intraperitoneal free fluid. Musculoskeletal: No worrisome lytic or sclerotic osseous abnormality. Tiny paraumbilical ventral hernia contains only fat. IMPRESSION: 1. No acute findings in the abdomen or pelvis. Specifically, no findings to explain the patient's history of flank pain. 2. Apparent circumferential bladder wall thickening. While this may be related to underdistention, correlation with urinalysis recommended as bladder infection/inflammation could have this appearance. 3. Tiny paraumbilical ventral hernia contains only fat. 4.  Aortic Atherosclerosis (ICD10-I70.0). Electronically Signed   By: Misty Stanley M.D.   On: 04/13/2022 17:50    Procedures Procedures    Medications Ordered in ED Medications  sodium chloride 0.9 % bolus 1,000 mL (0 mLs Intravenous Stopped 04/13/22 2008)  morphine (PF) 4 MG/ML injection 4 mg (4 mg Intravenous Given 04/13/22 1741)  ondansetron (ZOFRAN) injection 4 mg (4 mg Intravenous Given 04/13/22 1740)  ketorolac (TORADOL) 15 MG/ML injection 15 mg (15 mg Intravenous Given 04/13/22 1927)    ED Course/ Medical Decision Making/ A&P                             Medical Decision Making This patient presents to the ED for concern of right sided back pain with radiation to the RUQ, this involves an extensive number of  treatment options, and is a complaint that carries with it a high risk of complications and morbidity.  The differential diagnosis includes MSK pain, cholycystitis/lithiasis, UTI, nephrolithiasis  Co morbidities that complicate the patient evaluation  insulin dependent type 2 diabetes, hypertension, chronic pain, depression  My initial workup includes labs, imaging, fluids, nausea meds, pain meds  Additional history obtained from: Nursing notes from this visit.  I ordered, reviewed and interpreted labs which include: CBC, CMP, lipase, urinalysis. Urinalysis without signs of infection. Normal anion gap. Hyperglycemia of 235, mild hypokalemia.  I ordered imaging studies including CT renal stone study, RUQ [place] I independently visualized and interpreted imaging which showed no acute findings in the abdomen  or pelvis, normal RUQ [place] I agree with the radiologist interpretation  Afebrile, hypertensive but otherwise hemodynamically stable. 47 year old female presenting to the ED for evaluation of what appears to be acute on chronic right sided low back and RUQ pain. Physical exam is remarkable for generalized abdominal TTP and paraspinal TTP on the right. Positive right sided SLR indicating sciatica. Imaging overall reassuring. Lab work with hyperglycemia with normal anion gap and no evidence of DKA/HHS. Unable to initiate steroids for her pain due to uncontrolled diabetes. No red flag symptoms indicating cord compression. CT was slightly concerning for cystitis, however her urinalysis shows no signs of infection. Patient was encouraged to follow up with the [facility] wellness [facility]. She was given a Warden/ranger on financial assistance. She was encouraged to pick up her insulin prescription as soon as possible. She was given return precautions. Stable at discharge.  At this time there does not appear to be any evidence of an acute emergency medical condition and the patient appears stable for  discharge with appropriate outpatient follow up. Diagnosis was discussed with patient who verbalizes understanding of care plan and is agreeable to discharge. I have discussed return precautions with patient who verbalizes understanding. Patient encouraged to follow-up with their PCP within 1 week. All questions answered.  Patient's case discussed with Dr. Ashok Cordia who agrees with plan to discharge with follow-up.   Note: Portions of this report may have been transcribed using voice recognition software. Every effort was made to ensure accuracy; however, inadvertent computerized transcription errors may still be present.        Final Clinical Impression(s) / ED Diagnoses Final diagnoses:  Chronic right-sided low back pain with right-sided sciatica  Generalized abdominal pain    Rx / DC Orders ED Discharge Orders     None         Nehemiah Massed 04/13/22 2029    Lajean Saver, MD 04/13/22 2152

## 2022-04-13 NOTE — Discharge Instructions (Addendum)
You have been seen today for your complaint of back pain. Your lab work showed high blood sugar but was otherwise reassuring. Your imaging was reassuring and showed no abnormalities. Your discharge medications include Alternate tylenol and ibuprofen for pain. You may alternate these every 4 hours. You may take up to 800 mg of ibuprofen at a time and up to 1000 mg of tylenol. Follow up with: the [facility]. Call tomorrow to schedule an appointment.  Pick up your insulin prescription as soon as possible. Please seek immediate medical care if you develop any of the following symptoms: You are not able to control when you urinate or have bowel movements (incontinence). You have: Weakness in your lower back, pelvis, buttocks, or legs that gets worse. Redness or swelling of your back. A burning sensation when you urinate. At this time there does not appear to be the presence of an emergent medical condition, however there is always the potential for conditions to change. Please read and follow the below instructions.  Do not take your medicine if  develop an itchy rash, swelling in your mouth or lips, or difficulty breathing; call 911 and seek immediate emergency medical attention if this occurs.  You may review your lab tests and imaging results in their entirety on your MyChart account.  Please discuss all results of fully with your primary care provider and other specialist at your follow-up visit.  Note: Portions of this text may have been transcribed using voice recognition software. Every effort was made to ensure accuracy; however, inadvertent computerized transcription errors may still be present.

## 2022-04-13 NOTE — ED Triage Notes (Signed)
Right sided back pain x 1 week. "Burning"  Has "fatty liver"  Has had this in the past for approximately a year.  Tylenol and ibuprofen at home with no relief.  Possible reported retention of urine.  Pt reports she is also not able to get her insulin at this time.  VSS 138/88, pulse 80, SPO2 98, CBG 278

## 2022-06-17 ENCOUNTER — Emergency Department (EMERGENCY_DEPARTMENT_HOSPITAL)
Admission: EM | Admit: 2022-06-17 | Discharge: 2022-06-18 | Disposition: A | Discharge status: Other Acute Inpatient Hospital | Payer: No Typology Code available for payment source | Source: Home / Self Care | Attending: Emergency Medicine | Admitting: Emergency Medicine

## 2022-06-17 ENCOUNTER — Other Ambulatory Visit: Payer: Self-pay

## 2022-06-17 ENCOUNTER — Encounter (HOSPITAL_COMMUNITY): Payer: Self-pay

## 2022-06-17 DIAGNOSIS — I10 Essential (primary) hypertension: Secondary | ICD-10-CM | POA: Insufficient documentation

## 2022-06-17 DIAGNOSIS — Z794 Long term (current) use of insulin: Secondary | ICD-10-CM | POA: Insufficient documentation

## 2022-06-17 DIAGNOSIS — R000 Tachycardia, unspecified: Secondary | ICD-10-CM | POA: Insufficient documentation

## 2022-06-17 DIAGNOSIS — F332 Major depressive disorder, recurrent severe without psychotic features: Secondary | ICD-10-CM | POA: Insufficient documentation

## 2022-06-17 DIAGNOSIS — E119 Type 2 diabetes mellitus without complications: Secondary | ICD-10-CM | POA: Insufficient documentation

## 2022-06-17 DIAGNOSIS — Z79899 Other long term (current) drug therapy: Secondary | ICD-10-CM | POA: Insufficient documentation

## 2022-06-17 DIAGNOSIS — R45851 Suicidal ideations: Secondary | ICD-10-CM

## 2022-06-17 DIAGNOSIS — F17210 Nicotine dependence, cigarettes, uncomplicated: Secondary | ICD-10-CM | POA: Insufficient documentation

## 2022-06-17 LAB — COMPREHENSIVE METABOLIC PANEL
ALT: 14 U/L (ref 0–44)
AST: 13 U/L — ABNORMAL LOW (ref 15–41)
Albumin: 4 g/dL (ref 3.5–5.0)
Alkaline Phosphatase: 60 U/L (ref 38–126)
Anion gap: 8 (ref 5–15)
BUN: 24 mg/dL — ABNORMAL HIGH (ref 6–20)
CO2: 24 mmol/L (ref 22–32)
Calcium: 9.2 mg/dL (ref 8.9–10.3)
Chloride: 103 mmol/L (ref 98–111)
Creatinine, Ser: 0.96 mg/dL (ref 0.44–1.00)
GFR, Estimated: >60 mL/min (ref >60)
Glucose, Bld: 217 mg/dL — ABNORMAL HIGH (ref 70–99)
Potassium: 4.2 mmol/L (ref 3.5–5.1)
Sodium: 135 mmol/L (ref 135–145)
Total Bilirubin: 0.6 mg/dL (ref 0.3–1.2)
Total Protein: 7.7 g/dL (ref 6.5–8.1)

## 2022-06-17 LAB — CBC WITH DIFFERENTIAL/PLATELET
Abs Immature Granulocytes: 0.03 10*3/uL (ref 0.00–0.07)
Basophils Absolute: 0.1 10*3/uL (ref 0.0–0.1)
Basophils Relative: 1 %
Eosinophils Absolute: 0.1 10*3/uL (ref 0.0–0.5)
Eosinophils Relative: 1 %
HCT: 39.9 % (ref 36.0–46.0)
Hemoglobin: 13.4 g/dL (ref 12.0–15.0)
Immature Granulocytes: 0 %
Lymphocytes Relative: 42 %
Lymphs Abs: 3.9 10*3/uL (ref 0.7–4.0)
MCH: 29.5 pg (ref 26.0–34.0)
MCHC: 33.6 g/dL (ref 30.0–36.0)
MCV: 87.9 fL (ref 80.0–100.0)
Monocytes Absolute: 0.5 10*3/uL (ref 0.1–1.0)
Monocytes Relative: 5 %
Neutro Abs: 4.8 10*3/uL (ref 1.7–7.7)
Neutrophils Relative %: 51 %
Platelets: 198 10*3/uL (ref 150–400)
RBC: 4.54 MIL/uL (ref 3.87–5.11)
RDW: 11.9 % (ref 11.5–15.5)
WBC: 9.3 10*3/uL (ref 4.0–10.5)
nRBC: 0 % (ref 0.0–0.2)

## 2022-06-17 LAB — SALICYLATE LEVEL: Salicylate Lvl: <7 mg/dL — ABNORMAL LOW (ref 7.0–30.0)

## 2022-06-17 LAB — RAPID URINE DRUG SCREEN, HOSP PERFORMED
Amphetamines: NOT DETECTED
Barbiturates: NOT DETECTED
Benzodiazepines: NOT DETECTED
Cocaine: NOT DETECTED
Opiates: NOT DETECTED
Tetrahydrocannabinol: NOT DETECTED

## 2022-06-17 LAB — ETHANOL: Alcohol, Ethyl (B): <10 mg/dL (ref <10)

## 2022-06-17 LAB — ACETAMINOPHEN LEVEL: Acetaminophen (Tylenol), Serum: 15 ug/mL (ref 10–30)

## 2022-06-17 LAB — HCG, QUANTITATIVE, PREGNANCY: hCG, Beta Chain, Quant, S: 2 m[IU]/mL (ref <5)

## 2022-06-17 MED ORDER — INSULIN ASPART 100 UNIT/ML IJ SOLN
5.0000 [IU] | Freq: Three times a day (TID) | INTRAMUSCULAR | Status: DC
  Administered 2022-06-18: 5 [IU] via SUBCUTANEOUS
  Filled 2022-06-17: qty 0.05

## 2022-06-17 MED ORDER — LORAZEPAM 1 MG PO TABS
1.0000 mg | ORAL_TABLET | Freq: Once | ORAL | Status: AC
  Administered 2022-06-17: 1 mg via ORAL
  Filled 2022-06-17: qty 1

## 2022-06-17 MED ORDER — ARIPIPRAZOLE 10 MG PO TABS
10.0000 mg | ORAL_TABLET | Freq: Every day | ORAL | Status: DC
  Administered 2022-06-17: 10 mg via ORAL
  Filled 2022-06-17 (×2): qty 1

## 2022-06-17 MED ORDER — LISINOPRIL 10 MG PO TABS
10.0000 mg | ORAL_TABLET | Freq: Every day | ORAL | Status: DC
  Filled 2022-06-17 (×2): qty 1

## 2022-06-17 MED ORDER — LORAZEPAM 1 MG PO TABS
1.0000 mg | ORAL_TABLET | ORAL | Status: DC | PRN
  Administered 2022-06-18: 1 mg via ORAL
  Filled 2022-06-17: qty 1

## 2022-06-17 MED ORDER — FLUOXETINE HCL 20 MG PO CAPS
20.0000 mg | ORAL_CAPSULE | Freq: Every day | ORAL | Status: DC
  Administered 2022-06-17 – 2022-06-18 (×2): 20 mg via ORAL
  Filled 2022-06-17 (×2): qty 1

## 2022-06-17 MED ORDER — TRAZODONE HCL 50 MG PO TABS: 50.0000 mg | ORAL_TABLET | Freq: Every evening | ORAL | Status: DC | PRN

## 2022-06-17 MED ORDER — INSULIN GLARGINE-YFGN 100 UNIT/ML ~~LOC~~ SOLN
14.0000 [IU] | Freq: Every day | SUBCUTANEOUS | Status: DC
  Administered 2022-06-17: 14 [IU] via SUBCUTANEOUS
  Filled 2022-06-17: qty 0.14

## 2022-06-17 MED ORDER — GABAPENTIN 400 MG PO CAPS
400.0000 mg | ORAL_CAPSULE | Freq: Three times a day (TID) | ORAL | Status: DC
  Administered 2022-06-17 – 2022-06-18 (×2): 400 mg via ORAL
  Filled 2022-06-17 (×2): qty 1

## 2022-06-17 NOTE — ED Triage Notes (Addendum)
Pt states " I want to fight people and choke them out or just beat them to I'm tired.  I am not myself, I'm stressed." Recent death in family Pt reports being depressed and SI/HI Plan of running in front of car on bypass. Crying in triage Out of meds x3 weeks.

## 2022-06-17 NOTE — ED Notes (Signed)
On arrival to the unit affect was bright on approach mood pleasant cooperative. Pt denies current suicidal ideation but admits to thought just prior to coming to the ED.

## 2022-06-17 NOTE — ED Provider Notes (Signed)
[place] EMERGENCY DEPARTMENT AT [facility] Provider Note   CSN: [id number] Arrival date & time: 06/17/22  3664     History  Chief Complaint  Patient presents with   Psychiatric Evaluation    Sara [name] is a 47 y.o. female history of diabetes, hypertension, here presenting with suicidal ideation.  Patient states that her uncle died about a month ago and she been very depressed.  She also has some housing issues and has been staying at [facility].  She states that 3 weeks ago, she ran out of her medications and unable to get any refill.  Patient states that she is suicidal and plans to walk into traffic.  Patient called EMS and voluntarily came here for help.  Patient had previous psych admissions.  Patient states that she also ran out of her medications for diabetes.  Patient denies any overdose or alcohol use.   The history is provided by the patient.       Home Medications Prior to Admission medications   Medication Sig Start Date End Date Taking? Authorizing Provider  acetaminophen (TYLENOL) 500 MG tablet Take 1,000 mg by mouth every 4 (four) hours as needed for mild pain or moderate pain (alternating with Ibuprofen).    [provider]  albuterol (VENTOLIN HFA) 108 (90 Base) MCG/ACT inhaler Inhale 1-2 puffs into the lungs every 6 (six) hours as needed for wheezing. 02/09/19   [provider]  ARIPiprazole (ABILIFY) 10 MG tablet Take 1 tablet (10 mg total) by mouth at bedtime. For mood stabilization. 04/04/22   Armandina Stammer I, NP  docusate sodium (COLACE) 100 MG capsule Take 1 capsule (100 mg total) by mouth 2 (two) times daily. 02/07/22   Sarita Bottom, MD  FLUoxetine (PROZAC) 20 MG capsule Take 1 capsule (20 mg total) by mouth daily. For depression 04/04/22   Armandina Stammer I, NP  gabapentin (NEURONTIN) 400 MG capsule Take 1 capsule (400 mg total) by mouth 3 (three) times daily. For anxiety/pain 04/04/22   Armandina Stammer I, NP  hydrOXYzine (ATARAX) 25 MG  tablet Take 1 tablet (25 mg total) by mouth 3 (three) times daily as needed for anxiety. 04/04/22   Armandina Stammer I, NP  ibuprofen (ADVIL) 200 MG tablet Take 800 mg by mouth every 8 (eight) hours as needed for mild pain or moderate pain. Alternating with Tylenol    [provider]  insulin aspart (NOVOLOG) 100 UNIT/ML injection Inject 5 Units into the skin 3 (three) times daily with meals. For diabetes management 04/04/22   Armandina Stammer I, NP  Insulin Glargine w/ Trans Port 100 UNIT/ML SOPN Inject 14 Units into the skin daily. For diabetes management 04/04/22   Armandina Stammer I, NP  lisinopril (ZESTRIL) 10 MG tablet Take 1 tablet (10 mg total) by mouth daily. For high blood pressure 04/05/22   Armandina Stammer I, NP  nicotine polacrilex (NICORETTE) 2 MG gum Take 1 each (2 mg total) by mouth as needed. (May buy from over the counter): For smoking cessation 04/04/22   Armandina Stammer I, NP  omeprazole (PRILOSEC) 40 MG capsule Take 1 capsule (40 mg total) by mouth daily. For acid rflux 04/04/22   Armandina Stammer I, NP  traZODone (DESYREL) 50 MG tablet Take 1 tablet (50 mg total) by mouth at bedtime as needed for sleep. 04/04/22   Armandina Stammer I, NP      Allergies    Clarithromycin, Doxycycline, Penicillins, Tetracyclines & related, Metronidazole, and Tylenol [acetaminophen]  Review of Systems   Review of Systems  Psychiatric/Behavioral:  Positive for suicidal ideas.   All other systems reviewed and are negative.   Physical Exam Updated Vital Signs BP (!) 157/92 (BP Location: Right Arm)   Pulse (!) 120   Temp 98.5 F (36.9 C) (Oral)   Wt 66 kg   SpO2 99%   BMI 24.98 kg/m  Physical Exam Vitals and nursing note reviewed.  Constitutional:      Comments: Anxious   HENT:     Head: Normocephalic.     Nose: Nose normal.     Mouth/Throat:     Mouth: Mucous membranes are moist.  Eyes:     Pupils: Pupils are equal, round, and reactive to light.  Cardiovascular:     Rate and Rhythm: Tachycardia present.      Pulses: Normal pulses.  Pulmonary:     Effort: Pulmonary effort is normal.     Breath sounds: Normal breath sounds.  Abdominal:     General: Abdomen is flat.     Palpations: Abdomen is soft.  Musculoskeletal:        General: Normal range of motion.     Cervical back: Normal range of motion and neck supple.  Skin:    General: Skin is warm.     Capillary Refill: Capillary refill takes less than 2 seconds.  Neurological:     General: No focal deficit present.     Mental Status: She is alert and oriented to person, place, and time.  Psychiatric:        Mood and Affect: Mood normal.        Behavior: Behavior normal.     ED Results / Procedures / Treatments   Labs (all labs ordered are listed, but only abnormal results are displayed) Labs Reviewed  CBC WITH DIFFERENTIAL/PLATELET  COMPREHENSIVE METABOLIC PANEL  ETHANOL  SALICYLATE LEVEL  ACETAMINOPHEN LEVEL  RAPID URINE DRUG SCREEN, HOSP PERFORMED  I-STAT BETA HCG BLOOD, ED ([facility], [facility], [facility] ONLY)    EKG None  Radiology No results found.  Procedures Procedures    Medications Ordered in ED Medications  ARIPiprazole (ABILIFY) tablet 10 mg (has no administration in time range)  FLUoxetine (PROZAC) capsule 20 mg (has no administration in time range)  gabapentin (NEURONTIN) capsule 400 mg (has no administration in time range)  traZODone (DESYREL) tablet 50 mg (has no administration in time range)  lisinopril (ZESTRIL) tablet 10 mg (has no administration in time range)  Insulin Glargine w/ Trans Port SOPN 14 Units (has no administration in time range)  insulin aspart (novoLOG) injection 5 Units (has no administration in time range)  LORazepam (ATIVAN) tablet 1 mg (has no administration in time range)    ED Course/ Medical Decision Making/ A&P                             Medical Decision Making Sara [name] is a 47 y.o. female here presenting with anxiety and suicidal ideations.  Patient ran out of her meds and has  unstable social situation.  Patient is now suicidal and plans to walk into traffic.  Patient came here voluntarily.  Will get psych clearance labs and consult TTS.  8:52 PM I reviewed patient's labs and they were unremarkable.  Patient's heart rate improved after Ativan.  At this point, patient is medically clear for psych eval  Amount and/or Complexity of Data Reviewed Labs: ordered. Decision-making details documented in ED  Course.  Risk Prescription drug management.    Final Clinical Impression(s) / ED Diagnoses Final diagnoses:  None    Rx / DC Orders ED Discharge Orders     None         Charlynne Pander, MD 06/17/22 2053

## 2022-06-17 NOTE — Consult Note (Cosign Needed Addendum)
BH ED ASSESSMENT   Reason for Consult:  Psychiatry evaluation Referring Physician:  ER Physician Patient Identification: Sara [name] MRN:  [id number] ED Chief Complaint: Major depressive disorder, recurrent severe without psychotic features (HCC)  Diagnosis:  Principal Problem:   Major depressive disorder, recurrent severe without psychotic features [facility])   ED Assessment Time Calculation: Start Time: 1921 Stop Time: 1942 Total Time in Minutes (Assessment Completion): 21   Subjective:   Sara [name] is a 47 y.o. female patient admitted with previous hx of Depression.came in to the ER tearful with complaint of been out of her Psychiatry Medications for three weeks.  Patient reports severe Depression and suicidal ideation.  HPI:  Patient was seen crying and states she has not been sleeping or eating.  She rates Depression 10/10 with 10 being severe depression.  Patient reports her stressor as not having her medications and recent death in the family.  Patient report intense irritability stating " at times I want to choke people"  She reports poor concentration lack of focus.  Patient is alert, engaged in meaningful conversation and answered all questions asked of her.  Patient reports passive suicide ideation with no plan stating she is in a safe place not to do anything stupid.  Patient was hospitalized at [facility] in March and states her medications are effective but she did not have a Psychiatrist to refill and some times no money to refill her Medicines.  Patient denies HI/AVH and no mention of paranoia. AA female who appears stated age, well groomed came in for severe depression and suicide ideation in the context of not taking Medication for three week.  Her Medications are resumed and we will seek inpatient Psychiatry hospitalization.  We will seek bed placement at any facility with available bed.    Past Psychiatric History: MDD, Multiple inpatient Psychiatry hospitalizations including [facility]  in January and March of this year.  Two suicide attempts by OD, last was 2006.  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  She lives in [place] house.  No outpatient Psychiatrist. Risk to Self or Others: Is the patient at risk to self? Yes Has the patient been a risk to self in the past 6 months? Yes Has the patient been a risk to self within the distant past? Yes Is the patient a risk to others? No Has the patient been a risk to others in the past 6 months? No Has the patient been a risk to others within the distant past? 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 pylori infection    High risk medication use    Hypercholesterolemia    Hyperlipidemia, mixed    Hypertension    Other mixed anxiety disorders    Pollen allergies    Sickle  cell trait (HCC)    Sleep apnea    Vitamin D deficiency     Past Surgical History:  Procedure Laterality Date   ABDOMINAL HYSTERECTOMY     APPENDECTOMY     COLONOSCOPY  08-16-2008   Dr. Vallarie Mare   ESOPHAGOGASTRODUODENOSCOPY  08-16-2008   Dr. Rayfield Citizen    Family History:  Family History  Problem Relation Age of Onset   Clotting disorder Mother    Crohn's disease Mother    Heart disease Mother    Colon cancer Father    Clotting disorder Brother    Diabetes Brother    Diabetes Maternal Aunt    Liver cancer Maternal Uncle    Prostate cancer Maternal  Uncle    Colon cancer Maternal Uncle    Diabetes Paternal Aunt    Esophageal cancer Paternal Uncle    Family Psychiatric  History: Alcoholism in the family, Cousin completed suicide and mother suffered from Manic Depression. Social History:  Social History   Substance and Sexual Activity  Alcohol Use None     Social History   Substance and Sexual Activity  Drug Use Yes   Types: Marijuana    Social History   Socioeconomic History   Marital status: Divorced    Spouse name: Not on file   Number of children: 2   Years of education: Not on file   Highest education level: Not on file  Occupational History   Occupation: n/a  Tobacco Use   Smoking status: Every Day    Packs/day: 1    Types: Cigarettes   Smokeless tobacco: Never  Substance and Sexual Activity   Alcohol use: Not on file   Drug use: Yes    Types: Marijuana   Sexual activity: Not on file  Other Topics Concern   Not on file  Social History Narrative   Not on file   Social Determinants of Health   Financial Resource Strain: Not on file  Food Insecurity: Food Insecurity Present (04/01/2022)   Hunger Vital Sign    Worried About Running Out of Food in the Last Year: Often true    Ran Out of Food in the Last Year: Often true  Transportation Needs: Unmet Transportation Needs (04/01/2022)   PRAPARE - Administrator, Civil Service (Medical): Yes    Lack of Transportation (Non-Medical): Yes  Physical Activity: Not on file  Stress: Not on file  Social Connections: Not on file   Additional Social History:    Allergies:   Allergies  Allergen Reactions   Clarithromycin Itching and Rash   Doxycycline Anaphylaxis and Rash   Penicillins Anaphylaxis and Rash   Tetracyclines & Related Anaphylaxis   Metronidazole Itching and Rash   Tylenol [Acetaminophen] Itching    Rx with Tylenol Elixir, suppose due to dye? Can take tablets    Labs: No results found for this or any previous visit (from the past 48  hour(s)).  Current Facility-Administered Medications  Medication Dose Route Frequency Provider Last Rate Last Admin   ARIPiprazole (ABILIFY) tablet 10 mg  10 mg Oral QHS Charlynne Pander, MD       FLUoxetine [facility]) capsule 20 mg  20 mg Oral Daily Charlynne Pander, MD       gabapentin (NEURONTIN) capsule 400 mg  400 mg Oral TID Charlynne Pander, MD       [START ON 06/18/2022] insulin aspart (novoLOG) injection 5 Units  5 Units Subcutaneous TID WC Charlynne Pander, MD  Insulin Glargine w/ Trans Port SOPN 14 Units  14 Units Subcutaneous Daily Charlynne Pander, MD       lisinopril (ZESTRIL) tablet 10 mg  10 mg Oral Daily Charlynne Pander, MD       LORazepam (ATIVAN) tablet 1 mg  1 mg Oral Q4H PRN Charlynne Pander, MD       LORazepam (ATIVAN) tablet 1 mg  1 mg Oral Once Earney Navy, NP       traZODone (DESYREL) tablet 50 mg  50 mg Oral QHS PRN Charlynne Pander, MD       Current Outpatient Medications  Medication Sig Dispense Refill   acetaminophen (TYLENOL) 500 MG tablet Take 1,000 mg by mouth every 4 (four) hours as needed for mild pain or moderate pain (alternating with Ibuprofen).     albuterol (VENTOLIN HFA) 108 (90 Base) MCG/ACT inhaler Inhale 1-2 puffs into the lungs every 6 (six) hours as needed for wheezing.     ARIPiprazole (ABILIFY) 10 MG tablet Take 1 tablet (10 mg total) by mouth at bedtime. For mood stabilization. 30 tablet 0   docusate sodium (COLACE) 100 MG capsule Take 1 capsule (100 mg total) by mouth 2 (two) times daily. 60 capsule 0   FLUoxetine (PROZAC) 20 MG capsule Take 1 capsule (20 mg total) by mouth daily. For depression 30 capsule 0   gabapentin (NEURONTIN) 400 MG capsule Take 1 capsule (400 mg total) by mouth 3 (three) times daily. For anxiety/pain 90 capsule 0   hydrOXYzine (ATARAX) 25 MG tablet Take 1 tablet (25 mg total) by mouth 3 (three) times daily as needed for anxiety. 75 tablet 0   ibuprofen (ADVIL) 200 MG tablet Take 800 mg by mouth  every 8 (eight) hours as needed for mild pain or moderate pain. Alternating with Tylenol     insulin aspart (NOVOLOG) 100 UNIT/ML injection Inject 5 Units into the skin 3 (three) times daily with meals. For diabetes management 10 mL 0   Insulin Glargine w/ Trans Port 100 UNIT/ML SOPN Inject 14 Units into the skin daily. For diabetes management 10 mL 0   lisinopril (ZESTRIL) 10 MG tablet Take 1 tablet (10 mg total) by mouth daily. For high blood pressure 30 tablet 0   nicotine polacrilex (NICORETTE) 2 MG gum Take 1 each (2 mg total) by mouth as needed. (May buy from over the counter): For smoking cessation 1 tablet 0   omeprazole (PRILOSEC) 40 MG capsule Take 1 capsule (40 mg total) by mouth daily. For acid rflux 30 capsule 0   traZODone (DESYREL) 50 MG tablet Take 1 tablet (50 mg total) by mouth at bedtime as needed for sleep. 30 tablet 0    Musculoskeletal: Strength & Muscle Tone: within normal limits Gait & Station: normal Patient leans: Front   Psychiatric Specialty Exam: Presentation  General Appearance:  Casual  Eye Contact: Good  Speech: Clear and Coherent; Normal Rate  Speech Volume: Normal  Handedness: Right   Mood and Affect  Mood: Anxious; Depressed  Affect: Congruent; Depressed; Tearful   Thought Process  Thought Processes: Coherent; Goal Directed; Linear  Descriptions of Associations:Intact  Orientation:Full (Time, Place and Person)  Thought Content:Logical  History of Schizophrenia/Schizoaffective disorder:No data recorded Duration of Psychotic Symptoms:No data recorded Hallucinations:Hallucinations: None  Ideas of Reference:None  Suicidal Thoughts:Suicidal Thoughts: Yes, Passive SI Passive Intent and/or Plan: Without Plan  Homicidal Thoughts:Homicidal Thoughts: No   Sensorium  Memory: Immediate Good; Recent Good; Remote Good  Judgment: Intact  Insight: Present   Executive Functions  Concentration: Good  Attention  Span: Good  Recall: Good  Fund of Knowledge: Good  Language: Good   Psychomotor Activity  Psychomotor Activity: Psychomotor Activity: Normal   Assets  Assets: Communication Skills; Desire for Improvement; Housing; Physical Health    Sleep  Sleep: Sleep: Poor   Physical Exam: Physical Exam Vitals and nursing note reviewed.  Constitutional:      Appearance: Normal appearance.  HENT:     Head: Normocephalic.     Nose: Nose normal.  Cardiovascular:     Rate and Rhythm: Tachycardia present.  Pulmonary:     Effort: Pulmonary effort is normal.  Musculoskeletal:        General: Normal range of motion.     Cervical back: Normal range of motion.  Skin:    General: Skin is warm and dry.  Neurological:     Mental Status: She is alert and oriented to person, place, and time.  Psychiatric:        Attention and Perception: Attention and perception normal.        Mood and Affect: Mood is depressed.        Speech: Speech normal.        Behavior: Behavior normal. Behavior is cooperative.        Thought Content: Thought content normal.        Cognition and Memory: Cognition and memory normal.        Judgment: Judgment normal.    Review of Systems  Constitutional: Negative.   HENT: Negative.    Eyes: Negative.   Respiratory: Negative.    Cardiovascular: Negative.   Gastrointestinal: Negative.   Genitourinary: Negative.   Musculoskeletal: Negative.   Skin: Negative.   Neurological: Negative.   Endo/Heme/Allergies: Negative.   Psychiatric/Behavioral:  Positive for depression and suicidal ideas. The patient has insomnia.    Blood pressure (!) 157/92, pulse (!) 120, temperature 98.5 F (36.9 C), temperature source Oral, weight 66 kg, SpO2 99 %. Body mass index is 24.98 kg/m.  Medical Decision Making: AA female who appears stated age, well groomed came in for severe depression and suicide ideation in the context of not taking Medication for three week.  Her  Medications are resumed and we will seek inpatient Psychiatry hospitalization.  We will seek bed placement at any facility with available bed  Problem 1: Recurrent Major Depressive disorder, severe without Psychotic features.  Disposition:  Admit, seek bed placement.  Earney Navy, NP-PMHNP-BC 06/17/2022 7:44 PM

## 2022-06-17 NOTE — ED Notes (Signed)
Pt. Has two belonging bags placed in locker 32.

## 2022-06-18 ENCOUNTER — Encounter (HOSPITAL_COMMUNITY): Payer: Self-pay | Admitting: Nurse Practitioner

## 2022-06-18 ENCOUNTER — Inpatient Hospital Stay (HOSPITAL_COMMUNITY)
Admission: AD | Admit: 2022-06-18 | Discharge: 2022-06-22 | DRG: 885 | Disposition: A | Discharge status: Home / Self Care | Payer: No Typology Code available for payment source | Attending: Psychiatry | Admitting: Psychiatry

## 2022-06-18 DIAGNOSIS — E1151 Type 2 diabetes mellitus with diabetic peripheral angiopathy without gangrene: Secondary | ICD-10-CM | POA: Diagnosis present

## 2022-06-18 DIAGNOSIS — K30 Functional dyspepsia: Secondary | ICD-10-CM | POA: Diagnosis present

## 2022-06-18 DIAGNOSIS — I10 Essential (primary) hypertension: Secondary | ICD-10-CM | POA: Diagnosis present

## 2022-06-18 DIAGNOSIS — I70209 Unspecified atherosclerosis of native arteries of extremities, unspecified extremity: Secondary | ICD-10-CM | POA: Diagnosis present

## 2022-06-18 DIAGNOSIS — Z880 Allergy status to penicillin: Secondary | ICD-10-CM

## 2022-06-18 DIAGNOSIS — F419 Anxiety disorder, unspecified: Secondary | ICD-10-CM | POA: Diagnosis present

## 2022-06-18 DIAGNOSIS — Z5982 Transportation insecurity: Secondary | ICD-10-CM | POA: Diagnosis not present

## 2022-06-18 DIAGNOSIS — Z5900 Homelessness unspecified: Secondary | ICD-10-CM | POA: Diagnosis not present

## 2022-06-18 DIAGNOSIS — B379 Candidiasis, unspecified: Secondary | ICD-10-CM | POA: Diagnosis present

## 2022-06-18 DIAGNOSIS — Z79899 Other long term (current) drug therapy: Secondary | ICD-10-CM

## 2022-06-18 DIAGNOSIS — K5900 Constipation, unspecified: Secondary | ICD-10-CM | POA: Diagnosis present

## 2022-06-18 DIAGNOSIS — G4700 Insomnia, unspecified: Secondary | ICD-10-CM | POA: Diagnosis present

## 2022-06-18 DIAGNOSIS — R45851 Suicidal ideations: Secondary | ICD-10-CM | POA: Diagnosis not present

## 2022-06-18 DIAGNOSIS — K219 Gastro-esophageal reflux disease without esophagitis: Secondary | ICD-10-CM | POA: Diagnosis present

## 2022-06-18 DIAGNOSIS — Z5941 Food insecurity: Secondary | ICD-10-CM

## 2022-06-18 DIAGNOSIS — Z881 Allergy status to other antibiotic agents status: Secondary | ICD-10-CM

## 2022-06-18 DIAGNOSIS — E782 Mixed hyperlipidemia: Secondary | ICD-10-CM | POA: Diagnosis present

## 2022-06-18 DIAGNOSIS — Z886 Allergy status to analgesic agent status: Secondary | ICD-10-CM | POA: Diagnosis not present

## 2022-06-18 DIAGNOSIS — F17210 Nicotine dependence, cigarettes, uncomplicated: Secondary | ICD-10-CM | POA: Diagnosis present

## 2022-06-18 DIAGNOSIS — D573 Sickle-cell trait: Secondary | ICD-10-CM | POA: Diagnosis present

## 2022-06-18 DIAGNOSIS — F332 Major depressive disorder, recurrent severe without psychotic features: Principal | ICD-10-CM | POA: Diagnosis present

## 2022-06-18 LAB — GLUCOSE, CAPILLARY
Glucose-Capillary: 277 mg/dL — ABNORMAL HIGH (ref 70–99)
Glucose-Capillary: 346 mg/dL — ABNORMAL HIGH (ref 70–99)
Glucose-Capillary: 244 mg/dL — ABNORMAL HIGH (ref 70–99)

## 2022-06-18 LAB — CBG MONITORING, ED: Glucose-Capillary: 223 mg/dL — ABNORMAL HIGH (ref 70–99)

## 2022-06-18 MED ORDER — TRAZODONE HCL 50 MG PO TABS
50.0000 mg | ORAL_TABLET | Freq: Every day | ORAL | Status: DC
  Administered 2022-06-18: 50 mg via ORAL
  Filled 2022-06-18 (×3): qty 1

## 2022-06-18 MED ORDER — HYDROXYZINE HCL 25 MG PO TABS
25.0000 mg | ORAL_TABLET | Freq: Three times a day (TID) | ORAL | Status: DC | PRN
  Administered 2022-06-19 – 2022-06-21 (×2): 25 mg via ORAL
  Filled 2022-06-18 (×2): qty 1

## 2022-06-18 MED ORDER — IBUPROFEN 800 MG PO TABS
800.0000 mg | ORAL_TABLET | Freq: Once | ORAL | Status: AC
  Administered 2022-06-18: 800 mg via ORAL
  Filled 2022-06-18: qty 1

## 2022-06-18 MED ORDER — OXYCODONE HCL 5 MG PO TABS
5.0000 mg | ORAL_TABLET | Freq: Four times a day (QID) | ORAL | Status: DC | PRN
  Administered 2022-06-18: 5 mg via ORAL
  Filled 2022-06-18: qty 1

## 2022-06-18 MED ORDER — INSULIN ASPART 100 UNIT/ML IJ SOLN
5.0000 [IU] | Freq: Three times a day (TID) | INTRAMUSCULAR | Status: DC
  Administered 2022-06-18 – 2022-06-22 (×12): 5 [IU] via SUBCUTANEOUS

## 2022-06-18 MED ORDER — OXYCODONE HCL 5 MG PO TABS
5.0000 mg | ORAL_TABLET | Freq: Once | ORAL | Status: AC
  Administered 2022-06-18: 5 mg via ORAL
  Filled 2022-06-18: qty 1

## 2022-06-18 MED ORDER — LORAZEPAM 1 MG PO TABS: 2.0000 mg | ORAL_TABLET | Freq: Three times a day (TID) | ORAL | Status: DC | PRN

## 2022-06-18 MED ORDER — OXYCODONE-ACETAMINOPHEN 5-325 MG PO TABS
1.0000 | ORAL_TABLET | Freq: Four times a day (QID) | ORAL | Status: DC | PRN
  Administered 2022-06-18: 1 via ORAL
  Filled 2022-06-18: qty 1

## 2022-06-18 MED ORDER — INSULIN ASPART 100 UNIT/ML IJ SOLN: 0.0000 [IU] | Freq: Every day | INTRAMUSCULAR | Status: DC

## 2022-06-18 MED ORDER — DIPHENHYDRAMINE HCL 50 MG/ML IJ SOLN: 50.0000 mg | Freq: Three times a day (TID) | INTRAMUSCULAR | Status: DC | PRN

## 2022-06-18 MED ORDER — ALUM & MAG HYDROXIDE-SIMETH 200-200-20 MG/5ML PO SUSP: 30.0000 mL | ORAL | Status: DC | PRN

## 2022-06-18 MED ORDER — DIPHENHYDRAMINE HCL 25 MG PO CAPS: 50.0000 mg | ORAL_CAPSULE | Freq: Three times a day (TID) | ORAL | Status: DC | PRN

## 2022-06-18 MED ORDER — LORAZEPAM 2 MG/ML IJ SOLN: 2.0000 mg | Freq: Three times a day (TID) | INTRAMUSCULAR | Status: DC | PRN

## 2022-06-18 MED ORDER — PANTOPRAZOLE SODIUM 40 MG PO TBEC
40.0000 mg | DELAYED_RELEASE_TABLET | Freq: Every day | ORAL | Status: DC
  Administered 2022-06-18: 40 mg via ORAL
  Filled 2022-06-18: qty 1

## 2022-06-18 MED ORDER — OXYCODONE-ACETAMINOPHEN 10-325 MG PO TABS: 1.0000 | ORAL_TABLET | Freq: Four times a day (QID) | ORAL | Status: DC | PRN

## 2022-06-18 MED ORDER — OXYCODONE-ACETAMINOPHEN 5-325 MG PO TABS
1.0000 | ORAL_TABLET | Freq: Once | ORAL | Status: AC
  Administered 2022-06-18: 1 via ORAL
  Filled 2022-06-18: qty 1

## 2022-06-18 MED ORDER — INSULIN ASPART 100 UNIT/ML IJ SOLN: 0.0000 [IU] | Freq: Three times a day (TID) | INTRAMUSCULAR | Status: DC

## 2022-06-18 MED ORDER — GABAPENTIN 300 MG PO CAPS
600.0000 mg | ORAL_CAPSULE | Freq: Once | ORAL | Status: AC
  Administered 2022-06-18: 600 mg via ORAL
  Filled 2022-06-18 (×2): qty 2

## 2022-06-18 MED ORDER — MAGNESIUM HYDROXIDE 400 MG/5ML PO SUSP: 30.0000 mL | Freq: Every day | ORAL | Status: DC | PRN

## 2022-06-18 MED ORDER — INSULIN GLARGINE-YFGN 100 UNIT/ML ~~LOC~~ SOLN
14.0000 [IU] | Freq: Every day | SUBCUTANEOUS | Status: DC
  Administered 2022-06-18 – 2022-06-22 (×5): 14 [IU] via SUBCUTANEOUS
  Filled 2022-06-18 (×5): qty 0.14

## 2022-06-18 NOTE — ED Notes (Signed)
Hi. This patient is accepted to 403-1 after 0900 by [facility]. [place] attending. Dx Major depressive disorder, recurrent severe without psychotic features. Please pre admit this patient. AM AC will coordinate. Thank

## 2022-06-18 NOTE — ED Provider Notes (Signed)
Emergency Medicine Observation Re-evaluation Note  Sara [name] is a 47 y.o. female, seen on rounds today.  Pt initially presented to the ED for complaints of Psychiatric Evaluation Currently, the patient is sleeping.  Physical Exam  BP 133/83 (BP Location: Right Arm)   Pulse (!) 104   Temp 98.2 F (36.8 C) (Oral)   Resp 15   Wt 66 kg   SpO2 98%   BMI 24.98 kg/m  Physical Exam General: Sleeping Cardiac: Extremities well-perfused Lungs: Unlabored respirations Psych: Deferred  ED Course / MDM  EKG:   I have reviewed the labs performed to date as well as medications administered while in observation.  Recent changes in the last 24 hours include presentation to the emergency department yesterday for suicidal ideation.  She has been medically cleared.  She was evaluated by TTS who recommends inpatient psychiatric admission.  She has been accepted to [facility].  Plan  Current plan is for admission to [facility].    Gloris Manchester, MD 06/18/22 [phone]

## 2022-06-18 NOTE — Progress Notes (Signed)
Pt was accepted to [facility] TODAY5/22/2024; Bed Assignment 403-1  DX: Major depressive disorder, recurrent severe without psychotic features  Pt meets inpatient criteria per Earney Navy, NP-PMHNP-BC   Attending Physician will be Dr. Phineas Inches, MD   Report can be called to:  -Adult unit: [phone]  Pt can arrive after: 0900;[facility] will update care team.   Care Team notified: Night [facility] Fransico Michael, RN, Samuel Germany, RN, Chinwendu Royston Bake, NP, Sindy Guadeloupe, NP, Dispositions CSW [address] LCSWA    [place], [place] 06/18/2022 @ 1:30 AM

## 2022-06-18 NOTE — Plan of Care (Signed)
Diabetes management reviewed with pt

## 2022-06-18 NOTE — Progress Notes (Addendum)
Pt is under review at [facility] per [facility] AC Fransico Michael, RN for TODAY 06/18/22 PENDING signed voluntary consent faxed to [facility] [phone] per Alfonzo Feller, RN consent was faxed at 12:28pm.  Pt meets inpatient criteria per Earney Navy, NP-PMHNP-BC   Attending Physician will be Dr. Phineas Inches, MD   Report can be called to: -Adult unit: [phone]  Pt can arrive after: [facility] will update care team.  Care Team notified: Night [facility] Fransico Michael, RN, Samuel Germany, RN, Chinwendu Royston Bake, NP, Sindy Guadeloupe, NP, Dispositions CSW [address] LCSWA   [place], [place] 06/18/2022 @ 12:01 AM

## 2022-06-18 NOTE — ED Notes (Addendum)
Safe transport contacted for transfer to [facility]. Spoke with Thurston Pounds

## 2022-06-18 NOTE — Group Note (Signed)
Date:  06/18/2022 Time:  5:18 PM  Group Topic/Focus:  Goals Group:   The focus of this group is to help patients establish daily goals to achieve during treatment and discuss how the patient can incorporate goal setting into their daily lives to aide in recovery. Orientation:   The focus of this group is to educate the patient on the purpose and policies of crisis stabilization and provide a format to answer questions about their admission.  The group details unit policies and expectations of patients while admitted.    Participation Level:  Did Not Attend  Participation Quality:   n/a  Affect:   n/a  Cognitive:   n/a  Insight: None  Engagement in Group:   n/a  Modes of Intervention:   n/a  Additional Comments:   Pt did not attend.  Edmund Hilda Kimie Pidcock 06/18/2022, 5:18 PM

## 2022-06-18 NOTE — BHH Group Notes (Signed)
Spiritual care group on grief and loss facilitated by chaplain Dyanne Carrel, [facility]  Group Goal:  Support / Education around grief and loss  Members engage in facilitated group support and psycho-social education.  Group Description:  Following introductions and group rules, group members engaged in facilitated group dialog and support around topic of loss, with particular support around experiences of loss in their lives. Group Identified types of loss (relationships / self / things) and identified patterns, circumstances, and changes that precipitate losses. Reflected on thoughts / feelings around loss, normalized grief responses, and recognized variety in grief experience. Group noted Worden's four tasks of grief in discussion.  Group drew on Adlerian / Rogerian, narrative, MI,  Patient Progress: Sara [name] attended group and actively engaged and participated in group conversation.  She was tearful as she shared about her own experience with grief and loss.

## 2022-06-18 NOTE — Progress Notes (Signed)
Pt did not attend NA meeting ?

## 2022-06-18 NOTE — Group Note (Signed)
Recreation Therapy Group Note   Group Topic:Health and Wellness  Group Date: 06/18/2022 Start Time: 1400 End Time: 1450 Facilitators: Tramaine Sauls, Benito Mccreedy, LRT Location: [address]  Activity Description/Intervention: Therapeutic Drumming. Patients with peers and staff were given the opportunity to engage in a leader facilitated HealthRHYTHMS Group Empowerment Drumming Circle with staff from the [employer], in partnership with [employer]. Teaching laboratory technician and trained [employer], Theodoro Doing leading with LRT observing and documenting intervention and pt response. This evidenced-based practice targets 7 areas of health and wellbeing in the human experience including: stress-reduction, exercise, self-expression, camaraderie/support, nurturing, spirituality, and music-making (leisure).   Goal Area(s) Addresses:  Patient will engage in pro-social way in music group.  Patient will follow directions of drum leader on the first prompt. Patient will demonstrate no behavioral issues during group.  Patient will identify if a reduction in stress level occurs as a result of participation in therapeutic drum circle.    Education: Leisure exposure, Pharmacologist, Musical expression, Discharge Planning   Affect/Mood: Congruent and Depressed to Bright   Participation Level: Engaged   Participation Quality: Independent   Behavior: Appropriate, Cooperative, and Interactive    Speech/Thought Process: Directed, Focused, and Oriented   Insight: Good   Judgement: Good   Modes of Intervention: Teaching laboratory technician, Music, and Socialization   Patient Response to Interventions:  Interested  and Receptive   Education Outcome:  Acknowledges education and TEFL teacher understanding   Clinical Observations/Individualized Feedback: Sara [name] actively engaged in therapeutic drumming exercise and discussions. Pt was appropriate musical equipment for duration of programming. Pt was  expressive and openly shared a worry or fear with the drum circle to be validated. Pt identified depression as a challenging emotion for them today. Pt then rated depression on a scale of 1-10, 10 being highest a "7" before activity participation, and a "2" at conclusion of intervention. Pt shared a word to describe their drumming experience as "energizing".   Benito Mccreedy Radwan Cowley, LRT, CTRS 06/18/2022 3:08 PM

## 2022-06-18 NOTE — Plan of Care (Signed)
Pt alert and oriented times 4 and cooperative at time of assessment. Pt currently denies SI and denies any experience of HI/AVH. Pt states she is here is the context of recently losing her uncle and feeling guilt for not being there for him. Pt also states she has been experiencing financial problems and this is a major stressor for her. Pt lists 2 areas she would like to work on while here, "Accept and forgive myself for not being there for my uncle and get my money together" related to stating she has had an issue with gambling recently. Pt states her 2 sons are apart of her support system and she is currently living in an [place] house. Skin assessment completed with Herbert Seta, RN and pt notes to have tattoos located on R upper and R lower arm, chest, R outer leg, L shoulder and L forearm.

## 2022-06-19 ENCOUNTER — Encounter (HOSPITAL_COMMUNITY): Payer: Self-pay | Admitting: Nurse Practitioner

## 2022-06-19 DIAGNOSIS — F332 Major depressive disorder, recurrent severe without psychotic features: Principal | ICD-10-CM

## 2022-06-19 LAB — GLUCOSE, CAPILLARY
Glucose-Capillary: 230 mg/dL — ABNORMAL HIGH (ref 70–99)
Glucose-Capillary: 240 mg/dL — ABNORMAL HIGH (ref 70–99)
Glucose-Capillary: 170 mg/dL — ABNORMAL HIGH (ref 70–99)

## 2022-06-19 MED ORDER — OXYCODONE-ACETAMINOPHEN 10-325 MG PO TABS: 1.0000 | ORAL_TABLET | Freq: Three times a day (TID) | ORAL | Status: DC | PRN

## 2022-06-19 MED ORDER — PANTOPRAZOLE SODIUM 40 MG PO TBEC
40.0000 mg | DELAYED_RELEASE_TABLET | Freq: Every day | ORAL | Status: DC
  Administered 2022-06-19 – 2022-06-22 (×4): 40 mg via ORAL
  Filled 2022-06-19 (×6): qty 1

## 2022-06-19 MED ORDER — HYDROXYZINE HCL 25 MG PO TABS: 25.0000 mg | ORAL_TABLET | Freq: Three times a day (TID) | ORAL | Status: DC | PRN

## 2022-06-19 MED ORDER — DOCUSATE SODIUM 100 MG PO CAPS
100.0000 mg | ORAL_CAPSULE | Freq: Two times a day (BID) | ORAL | Status: DC
  Administered 2022-06-19 – 2022-06-22 (×7): 100 mg via ORAL
  Filled 2022-06-19 (×11): qty 1

## 2022-06-19 MED ORDER — GABAPENTIN 400 MG PO CAPS: 3600.0000 mg | ORAL_CAPSULE | Freq: Three times a day (TID) | ORAL | Status: DC

## 2022-06-19 MED ORDER — INSULIN ASPART 100 UNIT/ML IJ SOLN
0.0000 [IU] | Freq: Three times a day (TID) | INTRAMUSCULAR | Status: DC
  Administered 2022-06-19 – 2022-06-20 (×2): 2 [IU] via SUBCUTANEOUS
  Administered 2022-06-20: 3 [IU] via SUBCUTANEOUS
  Administered 2022-06-20 – 2022-06-21 (×2): 2 [IU] via SUBCUTANEOUS
  Administered 2022-06-21: 3 [IU] via SUBCUTANEOUS
  Administered 2022-06-21: 5 [IU] via SUBCUTANEOUS
  Administered 2022-06-22: 2 [IU] via SUBCUTANEOUS
  Administered 2022-06-22: 3 [IU] via SUBCUTANEOUS

## 2022-06-19 MED ORDER — PROPRANOLOL HCL 10 MG PO TABS
10.0000 mg | ORAL_TABLET | Freq: Two times a day (BID) | ORAL | Status: DC
  Administered 2022-06-20: 10 mg via ORAL
  Filled 2022-06-19 (×9): qty 1

## 2022-06-19 MED ORDER — NICOTINE POLACRILEX 2 MG MT GUM
2.0000 mg | CHEWING_GUM | OROMUCOSAL | Status: DC | PRN
  Administered 2022-06-20 – 2022-06-21 (×2): 2 mg via ORAL
  Filled 2022-06-19: qty 1

## 2022-06-19 MED ORDER — ALBUTEROL SULFATE HFA 108 (90 BASE) MCG/ACT IN AERS: 1.0000 | INHALATION_SPRAY | Freq: Four times a day (QID) | RESPIRATORY_TRACT | Status: DC | PRN

## 2022-06-19 MED ORDER — GABAPENTIN 300 MG PO CAPS
600.0000 mg | ORAL_CAPSULE | Freq: Three times a day (TID) | ORAL | Status: DC
  Administered 2022-06-19 – 2022-06-22 (×10): 600 mg via ORAL
  Filled 2022-06-19 (×16): qty 2

## 2022-06-19 MED ORDER — ARIPIPRAZOLE 10 MG PO TABS
10.0000 mg | ORAL_TABLET | Freq: Every day | ORAL | Status: DC
  Administered 2022-06-19 – 2022-06-21 (×3): 10 mg via ORAL
  Filled 2022-06-19 (×6): qty 1

## 2022-06-19 MED ORDER — OXYCODONE HCL 5 MG PO TABS
5.0000 mg | ORAL_TABLET | Freq: Three times a day (TID) | ORAL | Status: DC | PRN
  Administered 2022-06-19 – 2022-06-22 (×8): 5 mg via ORAL
  Filled 2022-06-19 (×10): qty 1

## 2022-06-19 MED ORDER — PROPRANOLOL HCL 20 MG PO TABS
20.0000 mg | ORAL_TABLET | Freq: Once | ORAL | Status: AC
  Administered 2022-06-19: 20 mg via ORAL
  Filled 2022-06-19: qty 1
  Filled 2022-06-19: qty 2

## 2022-06-19 MED ORDER — TRAZODONE HCL 100 MG PO TABS
100.0000 mg | ORAL_TABLET | Freq: Every day | ORAL | Status: DC
  Administered 2022-06-19 – 2022-06-21 (×3): 100 mg via ORAL
  Filled 2022-06-19 (×6): qty 1

## 2022-06-19 MED ORDER — FLUCONAZOLE 150 MG PO TABS
150.0000 mg | ORAL_TABLET | Freq: Once | ORAL | Status: AC
  Administered 2022-06-19: 150 mg via ORAL
  Filled 2022-06-19: qty 1

## 2022-06-19 MED ORDER — OXYCODONE-ACETAMINOPHEN 5-325 MG PO TABS
1.0000 | ORAL_TABLET | Freq: Three times a day (TID) | ORAL | Status: DC | PRN
  Administered 2022-06-19 – 2022-06-22 (×8): 1 via ORAL
  Filled 2022-06-19 (×8): qty 1

## 2022-06-19 MED ORDER — FLUOXETINE HCL 20 MG PO CAPS
20.0000 mg | ORAL_CAPSULE | Freq: Every day | ORAL | Status: DC
  Administered 2022-06-19 – 2022-06-22 (×4): 20 mg via ORAL
  Filled 2022-06-19 (×6): qty 1

## 2022-06-19 NOTE — Progress Notes (Signed)
   06/19/22 2300  Psych Admission Type (Psych Patients Only)  Admission Status Voluntary  Psychosocial Assessment  Patient Complaints Depression;Other (Comment) (chronic back pain)  Eye Contact Fair  Facial Expression Flat  Affect Appropriate to circumstance  Speech Logical/coherent  Interaction Assertive  Motor Activity Slow  Appearance/Hygiene In scrubs  Behavior Characteristics Cooperative;Appropriate to situation  Mood Pleasant;Depressed  Thought Process  Coherency WDL  Content WDL  Delusions None reported or observed  Perception WDL  Hallucination None reported or observed  Judgment WDL  Confusion None  Danger to Self  Current suicidal ideation? Denies  Agreement Not to Harm Self Yes  Description of Agreement verbal  Danger to Others  Danger to Others None reported or observed

## 2022-06-19 NOTE — Progress Notes (Signed)
Pt did not attend group. 

## 2022-06-19 NOTE — Inpatient Diabetes Management (Signed)
Inpatient Diabetes Program Recommendations  AACE/ADA: New Consensus Statement on Inpatient Glycemic Control (2015)  Target Ranges:  Prepandial:   less than 140 mg/dL      Peak postprandial:   less than 180 mg/dL (1-2 hours)      Critically ill patients:  140 - 180 mg/dL   Lab Results  Component Value Date   GLUCAP 230 (H) 06/19/2022   HGBA1C 12.7 (H) 02/01/2022    Review of Glycemic Control  Latest Reference Range & Units 06/18/22 07:52 06/18/22 13:01 06/18/22 16:31 06/18/22 21:21 06/19/22 06:03  Glucose-Capillary 70 - 99 mg/dL 161 (H) 096 (H) 045 (H) 244 (H) 230 (H)  (H): Data is abnormally high  Diabetes history: DM2 Outpatient Diabetes medications: novolog 5 units TID, Glargine 14 units QD, Jardiance 10 mg (per pcp note on 5/1 she has not picked up from pharmacy) Current orders for Inpatient glycemic control: Semglee 14 units QD, Novolog 5 units TID  Inpatient Diabetes Program Recommendations:    Novolog 0-15 units TID and 0-5 units QHS Semglee 16 units QAM  Will continue to follow while inpatient.  Thank you, Dulce Sellar, MSN, CDCES Diabetes Coordinator Inpatient Diabetes Program [phone] (team pager from 8a-5p)

## 2022-06-19 NOTE — Progress Notes (Signed)
   06/18/22 2020  Psych Admission Type (Psych Patients Only)  Admission Status Voluntary  Psychosocial Assessment  Patient Complaints Anxiety;Depression  Eye Contact Brief  Facial Expression Flat  Affect Sad;Depressed;Apprehensive  Speech Logical/coherent  Interaction Assertive  Motor Activity Slow  Appearance/Hygiene Poor hygiene  Behavior Characteristics Cooperative;Appropriate to situation  Mood Depressed;Sad  Thought Process  Coherency WDL  Content WDL  Delusions None reported or observed  Perception WDL  Hallucination None reported or observed  Judgment WDL  Confusion None  Danger to Self  Current suicidal ideation? Denies  Agreement Not to Harm Self Yes  Description of Agreement verbally contracts for safety.  Danger to Others  Danger to Others None reported or observed

## 2022-06-19 NOTE — Group Note (Signed)
Date:  06/19/2022 Time:  12:28 PM  Group Topic/Focus:  Emotional Education:   The focus of this group is to discuss what feelings/emotions are, and how they are experienced. Self Care:   The focus of this group is to help patients understand the importance of self-care in order to improve or restore emotional, physical, spiritual, interpersonal, and financial health.    Participation Level:  ActiveActive  Participation Quality:  Appropriate  Affect:  Appropriate  Cognitive:  Appropriate  Insight: Appropriate  Engagement in Group:  Supportive  Modes of Intervention:  Discussion, Exploration, Socialization, and Support  Additional Comments:    Memory Dance Shakeitha Umbaugh 06/19/2022, 12:28 PM
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
## 2022-06-19 NOTE — [facility] Suicide Risk Assessment (Signed)
[facility] INPATIENT:  Family/Significant Other Suicide Prevention Education  Suicide Prevention Education:  Patient Refusal for Family/Significant Other Suicide Prevention Education: The patient Sara [name] has refused to provide written consent for family/significant other to be provided Family/Significant Other Suicide Prevention Education during admission and/or prior to discharge.  Physician notified.  Corky Crafts 06/19/2022, 2:45 PM

## 2022-06-19 NOTE — Plan of Care (Signed)
  Problem: Activity: Goal: Interest or engagement in activities will improve Outcome: Progressing   Problem: Coping: Goal: Ability to verbalize frustrations and anger appropriately will improve Outcome: Progressing   Problem: Coping: Goal: Ability to demonstrate self-control will improve Outcome: Progressing   Problem: Safety: Goal: Periods of time without injury will increase Outcome: Progressing   

## 2022-06-19 NOTE — Progress Notes (Signed)
   06/19/22 1317  Psych Admission Type (Psych Patients Only)  Admission Status Voluntary  Psychosocial Assessment  Patient Complaints Depression  Eye Contact Fair  Facial Expression Flat  Affect Appropriate to circumstance  Speech Logical/coherent  Interaction Assertive  Motor Activity Other (Comment) (WNL)  Appearance/Hygiene In scrubs  Behavior Characteristics Cooperative;Appropriate to situation  Mood Pleasant  Thought Process  Coherency WDL  Content WDL  Delusions None reported or observed  Perception WDL  Hallucination None reported or observed  Judgment WDL  Confusion None  Danger to Self  Current suicidal ideation? Denies  Agreement Not to Harm Self Yes  Description of Agreement verbal  Danger to Others  Danger to Others None reported or observed
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
## 2022-06-19 NOTE — Plan of Care (Signed)
  Problem: Education: Goal: Emotional status will improve Outcome: Progressing Goal: Mental status will improve Outcome: Progressing Goal: Verbalization of understanding the information provided will improve Outcome: Progressing   Problem: Activity: Goal: Interest or engagement in activities will improve Outcome: Progressing Goal: Sleeping patterns will improve Outcome: Progressing   Problem: Coping: Goal: Ability to demonstrate self-control will improve Outcome: Progressing   Problem: Health Behavior/Discharge Planning: Goal: Identification of resources available to assist in meeting health care needs will improve Outcome: Progressing Goal: Compliance with treatment plan for underlying cause of condition will improve Outcome: Progressing   Problem: Physical Regulation: Goal: Ability to maintain clinical measurements within normal limits will improve Outcome: Progressing   Problem: Safety: Goal: Periods of time without injury will increase Outcome: Progressing   Problem: Coping: Goal: Coping ability will improve Outcome: Progressing   Problem: Health Behavior/Discharge Planning: Goal: Identification of resources available to assist in meeting health care needs will improve Outcome: Progressing

## 2022-06-19 NOTE — [facility] Suicide Risk Assessment (Signed)
Suicide Risk Assessment  Admission Assessment    [facility] Admission Suicide Risk Assessment   Nursing information obtained from:  Patient  Demographic factors:  Low socioeconomic status  Current Mental Status:  NA (pt currently denies SI)  Loss Factors:  Loss of significant relationship, Financial problems / change in socioeconomic status  Historical Factors:  Prior suicide attempts  Risk Reduction Factors:  Living with another person, especially a relative, Positive social support  Total Time spent with patient: 1 hour  Principal Problem: MDD (major depressive disorder), recurrent severe, without psychosis (HCC)  Diagnosis:  Principal Problem:   MDD (major depressive disorder), recurrent severe, without psychosis (HCC)  Subjective Data: See H&P.  Continued Clinical Symptoms:    The "Alcohol Use Disorders Identification Test", Guidelines for Use in Primary Care, Second Edition.  World Science writer [facility]). Score between 0-7:  no or low risk or alcohol related problems. Score between 8-15:  moderate risk of alcohol related problems. Score between 16-19:  high risk of alcohol related problems. Score 20 or above:  warrants further diagnostic evaluation for alcohol dependence and treatment.  CLINICAL FACTORS:   Depression:   Insomnia More than one psychiatric diagnosis Previous Psychiatric Diagnoses and Treatments Medical Diagnoses and Treatments/Surgeries  Musculoskeletal: Strength & Muscle Tone: within normal limits Gait & Station: normal Patient leans: N/A  Psychiatric Specialty Exam:  Presentation  General Appearance:  Casual; Disheveled  Eye Contact: Good  Speech: Clear and Coherent; Normal Rate  Speech Volume: Normal  Handedness: Right  Mood and Affect  Mood: Depressed  Affect: Congruent  Thought Process  Thought Processes: Coherent; Goal Directed; Linear  Descriptions of Associations:Intact  Orientation:Full (Time, Place and  Person)  Thought Content:Logical  History of Schizophrenia/Schizoaffective disorder:No data recorded Duration of Psychotic Symptoms:No data recorded Hallucinations:Hallucinations: None  Ideas of Reference:None  Suicidal Thoughts:Suicidal Thoughts: No SI Passive Intent and/or Plan: Without Intent; Without Plan; Without Means to Carry Out; Without Access to Means  Homicidal Thoughts:Homicidal Thoughts: No  Sensorium  Memory: Immediate Good; Recent Good; Remote Good  Judgment: Fair  Insight: Fair  Art therapist  Concentration: Fair  Attention Span: Fair  Recall: Good  Fund of Knowledge: Fair  Language: Good  Psychomotor Activity  Psychomotor Activity:Psychomotor Activity: Normal   Assets  Assets: Communication Skills; Desire for Improvement; Financial Resources/Insurance; Housing; Resilience; Social Support; Physical Health  Sleep  Sleep:Sleep: Fair Number of Hours of Sleep: 6   Physical Exam: See H&P. Blood pressure 96/66, pulse (!) 129, temperature 98 F (36.7 C), temperature source Oral, resp. rate 20, height 5\' 4"  (1.626 m), weight 65.7 kg, SpO2 99 %. Body mass index is 24.85 kg/m.  COGNITIVE FEATURES THAT CONTRIBUTE TO RISK:  Closed-mindedness, Polarized thinking, and Thought constriction (tunnel vision)    SUICIDE RISK:   Severe:  Frequent, intense, and enduring suicidal ideation, specific plan, no subjective intent, but some objective markers of intent (i.e., choice of lethal method), the method is accessible, some limited preparatory behavior, evidence of impaired self-control, severe dysphoria/symptomatology, multiple risk factors present, and few if any protective factors, particularly a lack of social support.  PLAN OF CARE: See H&P  I certify that inpatient services furnished can reasonably be expected to improve the patient's condition.   Armandina Stammer, NP, [id number] 06/19/2022, 2:06 PM

## 2022-06-20 ENCOUNTER — Encounter (HOSPITAL_COMMUNITY): Payer: Self-pay

## 2022-06-20 LAB — GLUCOSE, CAPILLARY
Glucose-Capillary: 197 mg/dL — ABNORMAL HIGH (ref 70–99)
Glucose-Capillary: 191 mg/dL — ABNORMAL HIGH (ref 70–99)
Glucose-Capillary: 201 mg/dL — ABNORMAL HIGH (ref 70–99)
Glucose-Capillary: 240 mg/dL — ABNORMAL HIGH (ref 70–99)

## 2022-06-20 NOTE — Plan of Care (Signed)
  Problem: Education: Goal: Knowledge of [facility] Education information/materials will improve Outcome: Progressing Goal: Emotional status will improve Outcome: Progressing Goal: Mental status will improve Outcome: Progressing Goal: Verbalization of understanding the information provided will improve Outcome: Progressing   Problem: Coping: Goal: Ability to verbalize frustrations and anger appropriately will improve Outcome: Progressing Goal: Ability to demonstrate self-control will improve Outcome: Progressing   

## 2022-06-20 NOTE — Group Note (Deleted)
LCSW Group Therapy Note    Group Date: 06/20/2022 Start Time: 1100 End Time: 1200   Type of Therapy and Topic: Group Therapy: Body Image  Participation Level:  {[facility] PARTICIPATION LEVEL:22264}  Description of Group:  Patients were educated about body image and asked to think about whether they have a healthy or unhealthy body image. Patients were led in a discussion about factors that contribute to body image, both internal and external. Patients were asked to discuss strengths of the human body outside of appearance, such as being able to fight off diseases and provide stress relief. Lastly, patients were asked to identify one way in which they appreciate their own body outside of appearance.   Therapeutic Goals:   1. Patient will differentiate between a healthy and unhealthy body image. 2. Patient will identify what contributes to body image 3. Patient will discuss the strengths of the human body. 4. Patient will identify a positive attribute of their body outside of physical appearance.  Summary of Patient Progress:  *** actively engaged in processing and exploring how they are affected by body image. Patient proved open to input from peers and feedback from CSW. Patient demonstrated *** insight into the subject matter, was respectful and supportive of peers, and participated throughout the entire session.  Therapeutic Modalities: Cognitive Behavioral Therapy; Solution-Focused Therapy  Philmore Lepore B Pellegrino Kennard, LCSWA 06/20/2022  3:19 PM   

## 2022-06-20 NOTE — BH IP Treatment Plan (Signed)
Interdisciplinary Treatment and Diagnostic Plan   06/20/2022 Time of Session: 1125 ZAMIRAH MANCO MRN: [id number]  Principal Diagnosis: MDD (major depressive disorder), recurrent severe, without psychosis (HCC)  Secondary Diagnoses: Principal Problem:   MDD (major depressive disorder), recurrent severe, without psychosis (HCC)   Current Medications:  Current Facility-Administered Medications  Medication Dose Route Frequency Provider Last Rate Last Admin   albuterol (VENTOLIN HFA) 108 (90 Base) MCG/ACT inhaler 1-2 puff  1-2 puff Inhalation Q6H PRN Nwoko, Agnes I, NP       alum & mag hydroxide-simeth (MAALOX/MYLANTA) 200-200-20 MG/5ML suspension 30 mL  30 mL Oral Q4H PRN Onuoha, Chinwendu V, NP       ARIPiprazole (ABILIFY) tablet 10 mg  10 mg Oral QHS Nwoko, Agnes I, NP   10 mg at 06/19/22 2055   diphenhydrAMINE (BENADRYL) capsule 50 mg  50 mg Oral TID PRN Onuoha, Chinwendu V, NP       Or   diphenhydrAMINE (BENADRYL) injection 50 mg  50 mg Intramuscular TID PRN Onuoha, Chinwendu V, NP       docusate sodium (COLACE) capsule 100 mg  100 mg Oral BID Armandina Stammer I, NP   100 mg at 06/20/22 0752   FLUoxetine (PROZAC) capsule 20 mg  20 mg Oral Daily Armandina Stammer I, NP   20 mg at 06/20/22 0752   gabapentin (NEURONTIN) capsule 600 mg  600 mg Oral TID Armandina Stammer I, NP   600 mg at 06/20/22 1258   hydrOXYzine (ATARAX) tablet 25 mg  25 mg Oral TID PRN Onuoha, Chinwendu V, NP   25 mg at 06/19/22 1901   insulin aspart (novoLOG) injection 0-9 Units  0-9 Units Subcutaneous TID WC Nwoko, Nicole Kindred I, NP   2 Units at 06/20/22 1219   insulin aspart (novoLOG) injection 5 Units  5 Units Subcutaneous TID WC Massengill, Harrold Donath, MD   5 Units at 06/20/22 1220   insulin glargine-yfgn (SEMGLEE) injection 14 Units  14 Units Subcutaneous Daily Massengill, Harrold Donath, MD   14 Units at 06/20/22 0753   LORazepam (ATIVAN) tablet 2 mg  2 mg Oral TID PRN Onuoha, Chinwendu V, NP       Or   LORazepam (ATIVAN) injection 2 mg  2 mg  Intramuscular TID PRN Onuoha, Chinwendu V, NP       magnesium hydroxide (MILK OF MAGNESIA) suspension 30 mL  30 mL Oral Daily PRN Onuoha, Chinwendu V, NP       nicotine polacrilex (NICORETTE) gum 2 mg  2 mg Oral PRN Armandina Stammer I, NP       oxyCODONE-acetaminophen (PERCOCET/ROXICET) 5-325 MG per tablet 1 tablet  1 tablet Oral TID PRN Otho Bellows, RPH   1 tablet at 06/20/22 0754   And   oxyCODONE (Oxy IR/ROXICODONE) immediate release tablet 5 mg  5 mg Oral TID PRN Otho Bellows, RPH   5 mg at 06/20/22 0942   pantoprazole (PROTONIX) EC tablet 40 mg  40 mg Oral Daily Armandina Stammer I, NP   40 mg at 06/20/22 4098   propranolol (INDERAL) tablet 10 mg  10 mg Oral BID Armandina Stammer I, NP       traZODone (DESYREL) tablet 100 mg  100 mg Oral QHS Armandina Stammer I, NP   100 mg at 06/19/22 2056   PTA Medications: Medications Prior to Admission  Medication Sig Dispense Refill Last Dose   acetaminophen (TYLENOL) 500 MG tablet Take 1,000 mg by mouth every 4 (four) hours as needed for mild pain  or moderate pain (alternating with Ibuprofen).      albuterol (VENTOLIN HFA) 108 (90 Base) MCG/ACT inhaler Inhale 1-2 puffs into the lungs every 6 (six) hours as needed for wheezing.      ARIPiprazole (ABILIFY) 10 MG tablet Take 1 tablet (10 mg total) by mouth at bedtime. For mood stabilization. 30 tablet 0    docusate sodium (COLACE) 100 MG capsule Take 1 capsule (100 mg total) by mouth 2 (two) times daily. 60 capsule 0    FLUoxetine (PROZAC) 20 MG capsule Take 1 capsule (20 mg total) by mouth daily. For depression 30 capsule 0    gabapentin (NEURONTIN) 300 MG capsule Take 3,600 mg by mouth 3 (three) times daily.      gabapentin (NEURONTIN) 400 MG capsule Take 1 capsule (400 mg total) by mouth 3 (three) times daily. For anxiety/pain (Patient not taking: Reported on 06/17/2022) 90 capsule 0    hydrOXYzine (ATARAX) 25 MG tablet Take 1 tablet (25 mg total) by mouth 3 (three) times daily as needed for anxiety. 75 tablet 0     ibuprofen (ADVIL) 200 MG tablet Take 800 mg by mouth every 8 (eight) hours as needed for mild pain or moderate pain. Alternating with Tylenol (Patient not taking: Reported on 06/17/2022)      insulin aspart (NOVOLOG) 100 UNIT/ML injection Inject 5 Units into the skin 3 (three) times daily with meals. For diabetes management 10 mL 0    Insulin Glargine w/ Trans Port 100 UNIT/ML SOPN Inject 14 Units into the skin daily. For diabetes management 10 mL 0    lisinopril (ZESTRIL) 10 MG tablet Take 1 tablet (10 mg total) by mouth daily. For high blood pressure (Patient not taking: Reported on 06/17/2022) 30 tablet 0    nicotine polacrilex (NICORETTE) 2 MG gum Take 1 each (2 mg total) by mouth as needed. (May buy from over the counter): For smoking cessation (Patient taking differently: Take 2 mg by mouth daily as needed for smoking cessation.) 1 tablet 0    omeprazole (PRILOSEC) 40 MG capsule Take 1 capsule (40 mg total) by mouth daily. For acid rflux 30 capsule 0    oxyCODONE-acetaminophen (PERCOCET) 10-325 MG tablet Take 1 tablet by mouth every 4 (four) hours as needed for pain.      traZODone (DESYREL) 100 MG tablet Take 100 mg by mouth at bedtime.      traZODone (DESYREL) 50 MG tablet Take 1 tablet (50 mg total) by mouth at bedtime as needed for sleep. (Patient not taking: Reported on 06/17/2022) 30 tablet 0     Patient Stressors:    Patient Strengths:    Treatment Modalities: Medication Management, Group therapy, Case management,  1 to 1 session with clinician, Psychoeducation, Recreational therapy.   Physician Treatment Plan for Primary Diagnosis: MDD (major depressive disorder), recurrent severe, without psychosis (HCC) Long Term Goal(s): Improvement in symptoms so as ready for discharge   Short Term Goals: Ability to identify changes in lifestyle to reduce recurrence of condition will improve Ability to verbalize feelings will improve Ability to disclose and discuss suicidal ideas Ability to  demonstrate self-control will improve  Medication Management: Evaluate patient's response, side effects, and tolerance of medication regimen.  Therapeutic Interventions: 1 to 1 sessions, Unit Group sessions and Medication administration.  Evaluation of Outcomes: Progressing  Physician Treatment Plan for Secondary Diagnosis: Principal Problem:   MDD (major depressive disorder), recurrent severe, without psychosis (HCC)  Long Term Goal(s): Improvement in symptoms so as ready for discharge  Short Term Goals: Ability to identify changes in lifestyle to reduce recurrence of condition will improve Ability to verbalize feelings will improve Ability to disclose and discuss suicidal ideas Ability to demonstrate self-control will improve     Medication Management: Evaluate patient's response, side effects, and tolerance of medication regimen.  Therapeutic Interventions: 1 to 1 sessions, Unit Group sessions and Medication administration.  Evaluation of Outcomes: Progressing   RN Treatment Plan for Primary Diagnosis: MDD (major depressive disorder), recurrent severe, without psychosis (HCC) Long Term Goal(s): Knowledge of disease and therapeutic regimen to maintain health will improve  Short Term Goals: Ability to remain free from injury will improve, Ability to verbalize frustration and anger appropriately will improve, Ability to demonstrate self-control, Ability to participate in decision making will improve, Ability to verbalize feelings will improve, Ability to disclose and discuss suicidal ideas, Ability to identify and develop effective coping behaviors will improve, and Compliance with prescribed medications will improve  Medication Management: RN will administer medications as ordered by provider, will assess and evaluate patient's response and provide education to patient for prescribed medication. RN will report any adverse and/or side effects to prescribing provider.  Therapeutic  Interventions: 1 on 1 counseling sessions, Psychoeducation, Medication administration, Evaluate responses to treatment, Monitor vital signs and CBGs as ordered, Perform/monitor CIWA, COWS, AIMS and Fall Risk screenings as ordered, Perform wound care treatments as ordered.  Evaluation of Outcomes: Progressing   LCSW Treatment Plan for Primary Diagnosis: MDD (major depressive disorder), recurrent severe, without psychosis (HCC) Long Term Goal(s): Safe transition to appropriate next level of care at discharge, Engage patient in therapeutic group addressing interpersonal concerns.  Short Term Goals: Engage patient in aftercare planning with referrals and resources, Increase social support, Increase ability to appropriately verbalize feelings, Increase emotional regulation, Facilitate acceptance of mental health diagnosis and concerns, Facilitate patient progression through stages of change regarding substance use diagnoses and concerns, Identify triggers associated with mental health/substance abuse issues, and Increase skills for wellness and recovery  Therapeutic Interventions: Assess for all discharge needs, 1 to 1 time with Social worker, Explore available resources and support systems, Assess for adequacy in community support network, Educate family and significant other(s) on suicide prevention, Complete Psychosocial Assessment, Interpersonal group therapy.  Evaluation of Outcomes: Progressing   Progress in Treatment: Attending groups: Yes. Participating in groups: Yes. Taking medication as prescribed: Yes. Toleration medication: Yes. Family/Significant other contact made: No, will contact:  Pt declined consents Patient understands diagnosis: Yes. Discussing patient identified problems/goals with staff: Yes. Medical problems stabilized or resolved: Yes. Denies suicidal/homicidal ideation: Yes. Issues/concerns per patient self-inventory: Yes. Other: N/A  New problem(s) identified: No,  Describe:  None Reported  New Short Term/Long Term Goal(s): medication stabilization, elimination of SI thoughts, development of comprehensive mental wellness plan.    Patient Goals: Medication Stabilization   Discharge Plan or Barriers: Patient recently admitted. CSW will continue to follow and assess for appropriate referrals and possible discharge planning.      Reason for Continuation of Hospitalization: Anxiety Depression Medication stabilization Suicidal ideation Withdrawal symptoms  Estimated Length of Stay: 3-7 Days  Last 3 [place] Suicide Severity Risk Score: Flowsheet Row Ad[facility] (Current) [facility] from 06/17/2022 in [facility] ED from [facility]/13/2022 in [facility] Emergency Department at [facility]  C-SSRS RISK CATEGORY Moderate Risk No Risk No Risk       Last [facility] 2/9 Scores:  No data to display           medication stabilization, elimination of SI thoughts, development of comprehensive mental wellness plan.    Scribe for Treatment Team: Ane Payment, LCSW 06/20/2022 2:16 PM

## 2022-06-20 NOTE — Progress Notes (Signed)
   06/20/22 0800  Psych Admission Type (Psych Patients Only)  Admission Status Voluntary  Psychosocial Assessment  Patient Complaints Depression  Eye Contact Fair  Facial Expression Flat  Affect Appropriate to circumstance  Speech Logical/coherent  Interaction Assertive  Motor Activity Slow  Appearance/Hygiene In scrubs  Behavior Characteristics Cooperative;Appropriate to situation  Mood Depressed;Pleasant  Thought Process  Coherency WDL  Content WDL  Delusions None reported or observed  Perception WDL  Hallucination None reported or observed  Judgment WDL  Confusion None  Danger to Self  Current suicidal ideation? Denies  Agreement Not to Harm Self Yes  Description of Agreement verbal  Danger to Others  Danger to Others None reported or observed

## 2022-06-20 NOTE — Group Note (Signed)
Date:  06/20/2022 Time:  10:40 PM  Group Topic/Focus:  AA    Participation Level:  Active  Participation Quality:  Appropriate  Affect:  Appropriate  Cognitive:  Appropriate  Insight: Appropriate and None  Engagement in Group:  Engaged  Modes of Intervention:  Education  Additional Comments:  Patient attended AA group  Scot Dock 06/20/2022, 10:40 PM

## 2022-06-20 NOTE — Progress Notes (Signed)
[facility] MD Progress Note  06/20/2022 3:03 PM Sara [name]  MRN:  [id number]  Principal Problem: MDD (major depressive disorder), recurrent severe, without psychosis (HCC) Diagnosis: Principal Problem:   MDD (major depressive disorder), recurrent severe, without psychosis (HCC)    Reason for Admission:  This is one of several psychiatric admission/evaluation/treatment in this [facility] for this 47 year old AA female with chronic mental health issues & hx of polysubstance use disorder. Admitted to the [facility] from the [facility] with complain of worsening depression triggering suicidal ideation with plan to work into traffic. Per chart review, Sara [name] reported at the ED that her worsening depression & suicidal ideations with plan to walk into traffic was triggered by the recent death of her uncle (mother's brother) a month ago & she was unable to attend the funeral service. She also stated that she ran out of her mental health medications 3 weeks ago & has not been able to refill them. Sara [name] also has other chronic medical issues that required treatment & monitoring. After medical evaluation/clearance, she was transferred to the [facility] for further psychiatric evaluation/treatments.    Information obtained from 24-hour nursing report: Patient is doing better on the unit.    Information Obtained Today During Patient Interview:  Patient reports she is doing better. Reports she is here for med adjustment, reports she is a;ready feeling better. She reports her sleep and appetite has improved. She denies SI/HI or AVH. She was encouraged to work on coping strategies and a safe discharge plan.  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  pylori infection    High risk medication use    Hypercholesterolemia    Hyperlipidemia, mixed    Hypertension    Other mixed anxiety disorders    Pollen allergies    Sickle cell trait (HCC)    Sleep apnea    Vitamin D deficiency     Past Surgical History:  Procedure Laterality Date   ABDOMINAL HYSTERECTOMY     APPENDECTOMY     COLONOSCOPY  08-16-2008   Dr. Vallarie Mare   ESOPHAGOGASTRODUODENOSCOPY  08-16-2008   Dr. Rayfield Citizen    Family History:  Family History  Problem Relation Age of Onset   Clotting disorder Mother    Crohn's disease Mother    Heart disease Mother    Colon cancer Father    Clotting disorder Brother    Diabetes Brother    Diabetes Maternal Aunt    Liver cancer Maternal Uncle    Prostate cancer Maternal Uncle    Colon cancer Maternal Uncle    Diabetes Paternal Aunt    Esophageal cancer Paternal Uncle    Family Psychiatric  History: as per H and P Social History:  Social History   Substance and Sexual Activity  Alcohol Use Not Currently     Social History   Substance and Sexual Activity  Drug Use Not Currently   Types: Marijuana, Cocaine   Comment: 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    Social History   Socioeconomic History   Marital status: Divorced    Spouse name: Not on file   Number  of children: 2   Years of education: Not on file   Highest education level: Not on file  Occupational History   Occupation: n/a  Tobacco Use   Smoking status: Every Day    Packs/day: 1    Types: Cigarettes   Smokeless tobacco: Never  Substance and Sexual Activity   Alcohol use: Not Currently   Drug use: Not Currently    Types: Marijuana, Cocaine    Comment: 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   Sexual activity: Not on file  Other Topics Concern   Not on file  Social History Narrative   Not on file   Social Determinants of Health   Financial Resource Strain: Not on file  Food Insecurity: Food Insecurity Present  (06/18/2022)   Hunger Vital Sign    Worried About Running Out of Food in the Last Year: Often true    Ran Out of Food in the Last Year: Often true  Transportation Needs: Unmet Transportation Needs (06/18/2022)   PRAPARE - Administrator, Civil Service (Medical): Yes    Lack of Transportation (Non-Medical): Yes  Physical Activity: Not on file  Stress: Not on file  Social Connections: Not on file   Additional Social History:                         Sleep: fair  Appetite: fair  Current Medications: Current Facility-Administered Medications  Medication Dose Route Frequency Provider Last Rate Last Admin   albuterol (VENTOLIN HFA) 108 (90 Base) MCG/ACT inhaler 1-2 puff  1-2 puff Inhalation Q6H PRN Nwoko, Agnes I, NP       alum & mag hydroxide-simeth (MAALOX/MYLANTA) 200-200-20 MG/5ML suspension 30 mL  30 mL Oral Q4H PRN Onuoha, Chinwendu V, NP       ARIPiprazole (ABILIFY) tablet 10 mg  10 mg Oral QHS Nwoko, Agnes I, NP   10 mg at 06/19/22 2055   diphenhydrAMINE (BENADRYL) capsule 50 mg  50 mg Oral TID PRN Onuoha, Chinwendu V, NP       Or   diphenhydrAMINE (BENADRYL) injection 50 mg  50 mg Intramuscular TID PRN Onuoha, Chinwendu V, NP       docusate sodium (COLACE) capsule 100 mg  100 mg Oral BID Nwoko, Agnes I, NP   100 mg at 06/20/22 0752   FLUoxetine (PROZAC) capsule 20 mg  20 mg Oral Daily Nwoko, Nicole Kindred I, NP   20 mg at 06/20/22 0752   gabapentin (NEURONTIN) capsule 600 mg  600 mg Oral TID Armandina Stammer I, NP   600 mg at 06/20/22 1258   hydrOXYzine (ATARAX) tablet 25 mg  25 mg Oral TID PRN Onuoha, Chinwendu V, NP   25 mg at 06/19/22 1901   insulin aspart (novoLOG) injection 0-9 Units  0-9 Units Subcutaneous TID WC Nwoko, Agnes I, NP   2 Units at 06/20/22 1219   insulin aspart (novoLOG) injection 5 Units  5 Units Subcutaneous TID WC Massengill, Harrold Donath, MD   5 Units at 06/20/22 1220   insulin glargine-yfgn (SEMGLEE) injection 14 Units  14 Units Subcutaneous Daily  Massengill, Harrold Donath, MD   14 Units at 06/20/22 0753   LORazepam (ATIVAN) tablet 2 mg  2 mg Oral TID PRN Onuoha, Chinwendu V, NP       Or   LORazepam (ATIVAN) injection 2 mg  2 mg Intramuscular TID PRN Onuoha, Chinwendu V, NP       magnesium hydroxide (MILK OF MAGNESIA) suspension  30 mL  30 mL Oral Daily PRN Onuoha, Chinwendu V, NP       nicotine polacrilex (NICORETTE) gum 2 mg  2 mg Oral PRN Armandina Stammer I, NP       oxyCODONE-acetaminophen (PERCOCET/ROXICET) 5-325 MG per tablet 1 tablet  1 tablet Oral TID PRN Otho Bellows, RPH   1 tablet at 06/20/22 4098   And   oxyCODONE (Oxy IR/ROXICODONE) immediate release tablet 5 mg  5 mg Oral TID PRN Otho Bellows, RPH   5 mg at 06/20/22 0942   pantoprazole (PROTONIX) EC tablet 40 mg  40 mg Oral Daily Armandina Stammer I, NP   40 mg at 06/20/22 1191   propranolol (INDERAL) tablet 10 mg  10 mg Oral BID Armandina Stammer I, NP       traZODone (DESYREL) tablet 100 mg  100 mg Oral QHS Armandina Stammer I, NP   100 mg at 06/19/22 2056    Lab Results:  Results for orders placed or performed during the hospital encounter of 06/18/22 (from the past 48 hour(s))  Glucose, capillary     Status: Abnormal   Collection Time: 06/18/22  4:31 PM  Result Value Ref Range   Glucose-Capillary 346 (H) 70 - 99 mg/dL    Comment: Glucose reference range applies only to samples taken after fasting for at least 8 hours.  Glucose, capillary     Status: Abnormal   Collection Time: 06/18/22  9:21 PM  Result Value Ref Range   Glucose-Capillary 244 (H) 70 - 99 mg/dL    Comment: Glucose reference range applies only to samples taken after fasting for at least 8 hours.  Glucose, capillary     Status: Abnormal   Collection Time: 06/19/22  6:03 AM  Result Value Ref Range   Glucose-Capillary 230 (H) 70 - 99 mg/dL    Comment: Glucose reference range applies only to samples taken after fasting for at least 8 hours.   Comment 1 Notify RN    Comment 2 Document in Chart   Glucose, capillary      Status: Abnormal   Collection Time: 06/19/22 12:05 PM  Result Value Ref Range   Glucose-Capillary 240 (H) 70 - 99 mg/dL    Comment: Glucose reference range applies only to samples taken after fasting for at least 8 hours.  Glucose, capillary     Status: Abnormal   Collection Time: 06/19/22  4:31 PM  Result Value Ref Range   Glucose-Capillary 170 (H) 70 - 99 mg/dL    Comment: Glucose reference range applies only to samples taken after fasting for at least 8 hours.  Glucose, capillary     Status: Abnormal   Collection Time: 06/20/22  6:00 AM  Result Value Ref Range   Glucose-Capillary 197 (H) 70 - 99 mg/dL    Comment: Glucose reference range applies only to samples taken after fasting for at least 8 hours.  Glucose, capillary     Status: Abnormal   Collection Time: 06/20/22 11:51 AM  Result Value Ref Range   Glucose-Capillary 191 (H) 70 - 99 mg/dL    Comment: Glucose reference range applies only to samples taken after fasting for at least 8 hours.   Comment 1 Notify RN     Blood Alcohol level:  Lab Results  Component Value Date   ETH <10 06/17/2022   ETH  12/11/2009    <5        LOWEST DETECTABLE LIMIT FOR SERUM ALCOHOL IS 5 mg/dL FOR MEDICAL  PURPOSES ONLY    Metabolic Disorder Labs: Lab Results  Component Value Date   HGBA1C 12.7 (H) 02/01/2022   MPG 318 02/01/2022   No results found for: "PROLACTIN" Lab Results  Component Value Date   CHOL 204 (H) 02/01/2022   TRIG 61 02/01/2022   HDL 61 02/01/2022   CHOLHDL 3.3 02/01/2022   VLDL 12 02/01/2022   LDLCALC 131 (H) 02/01/2022    Physical Findings:   Psychiatric Specialty Exam: Physical Exam Constitutional:      Appearance: the patient is not toxic-appearing.  Pulmonary:     Effort: Pulmonary effort is normal.  Neurological:     General: No focal deficit present.     Mental Status: the patient is alert and oriented to person, place, and time.   Review of Systems  Respiratory:  Negative for shortness of  breath.   Cardiovascular:  Negative for chest pain.  Gastrointestinal:  Negative for abdominal pain, constipation, diarrhea, nausea and vomiting.  Neurological:  Negative for headaches.      BP (!) 83/68 (BP Location: Right Arm)   Pulse 100   Temp 98.3 F (36.8 C) (Oral)   Resp 18   Ht 5\' 4"  (1.626 m)   Wt 65.7 kg   SpO2 100%   BMI 24.85 kg/m   General Appearance: Fairly Groomed  Eye Contact:  Good  Speech:  Clear and Coherent  Volume:  Normal  Mood:  " better"  Affect:  Animated,Congruent  Thought Process:  Coherent  Orientation:  Full (Time, Place, and Person)  Thought Content: Logical   Suicidal Thoughts:  No  Homicidal Thoughts:  No  Memory:  Immediate;   Good  Judgement:  fair  Insight:  fair  Psychomotor Activity:  Normal  Concentration:  Concentration: Good  Recall:  Good  Fund of Knowledge: Good  Language: Good  Akathisia:  No  Handed:  not assessed  AIMS (if indicated): not done  Assets:  Communication Skills Desire for Improvement Financial Resources/Insurance Housing Leisure Time Physical Health  ADL's:  Intact  Cognition: WNL  Sleep:  Fair      Principal/active diagnoses. Major depressive disorder, recurrent episodes without psychotic features.  Hx. Alcohol use disorder.  Hx. Stimulant use disorder (cocaine).  Hx. Cannabis use disorder.  Plan: -Resumed Abilify 10 mg po daily for mood stability.  -Resumed Prozac 20 mg po daily for depression.  -Continue gabapentin 600 mg po tid for neuropathic pain.  -Continue Hydroxyzine 25 mg po tid prn for anxiety.  -Continue Trazodone 100 mg po Q hs for insomnia.  -Continue nicorette gum 2 mg po as needed for nicotine withdrawal management.   Agitation protocols.  -Continue Benadryl 50 mg po or IM tid prn for agitation. -Continue Haldol 5 mg po or IM tid prn for agitation.  -Continue Lorazepam 2 mg po or IM tid prn for agitation.    Other medical conditions.  -Continue albuterol inhaler 1-2 puffs Q  6 hrs prn for SOB.  -Continue colace 100 mg po bid for constipation.  -Continue gabapentin 600 mg po tid for neuropathic pain. -Continue the sliding insulin coverage for elevated blood sugar as recommended.  -Continue SEMGLEE insulin 14 units subs Q Q hs for DM. -Continue Protonix 40 mg po Q am for acid reflux.  -Initiated Diflucan 150 mg po once for yeast infection.   Other PRNS -Continue Tylenol 650 mg every 6 hours PRN for mild pain -Continue Maalox 30 ml Q 4 hrs PRN for indigestion -Continue MOM 30  ml po Q 6 hrs for constipation   Safety and Monitoring: Voluntary admission to inpatient psychiatric unit for safety, stabilization and treatment Daily contact with patient to assess and evaluate symptoms and progress in treatment Patient's case to be discussed in multi-disciplinary team meeting Observation Level : q15 minute checks Vital signs: q12 hours Precautions: Safety   Discharge Planning: Social work and case management to assist with discharge planning and identification of hospital follow-up needs prior to discharge Estimated LOS: 5-7 days Discharge Concerns: Need to establish a safety plan; Medication compliance and effectiveness Discharge Goals: Return home with outpatient referrals for mental health follow-up including medication management/psychotherapy   Observation Level/Precautions:  15 minute checks  Laboratory:    Psychotherapy: Enrolled in the therapeutic milieu  Medications: See [facility]  Consultations: As needed.  Discharge Concerns: Safety, mood stability.  Estimated LOS: 3 to 5 days  Other: NA     Physician Treatment Plan for Primary Diagnosis: MDD (major depressive disorder), recurrent severe, without psychosis (HCC)   Long Term Goal(s): Improvement in symptoms so as ready for discharge   Short Term Goals: Ability to identify changes in lifestyle to reduce recurrence of condition will improve, Ability to verbalize feelings will improve, Ability to disclose  and discuss suicidal ideas, and Ability to demonstrate self-control will improve   I certify that inpatient services furnished can reasonably be expected to improve the patient's condition.

## 2022-06-21 LAB — GLUCOSE, CAPILLARY
Glucose-Capillary: 189 mg/dL — ABNORMAL HIGH (ref 70–99)
Glucose-Capillary: 255 mg/dL — ABNORMAL HIGH (ref 70–99)
Glucose-Capillary: 230 mg/dL — ABNORMAL HIGH (ref 70–99)
Glucose-Capillary: 212 mg/dL — ABNORMAL HIGH (ref 70–99)

## 2022-06-21 MED ORDER — IBUPROFEN 400 MG PO TABS
400.0000 mg | ORAL_TABLET | ORAL | Status: DC | PRN
  Administered 2022-06-21: 400 mg via ORAL
  Filled 2022-06-21: qty 1

## 2022-06-21 NOTE — Progress Notes (Signed)
   06/20/22 2200  Psych Admission Type (Psych Patients Only)  Admission Status Voluntary  Psychosocial Assessment  Patient Complaints Anxiety  Eye Contact Fair  Facial Expression Animated  Affect Appropriate to circumstance  Speech Logical/coherent  Interaction Assertive  Motor Activity Slow  Appearance/Hygiene Unremarkable  Behavior Characteristics Cooperative;Appropriate to situation  Mood Pleasant  Thought Process  Coherency WDL  Content WDL  Delusions None reported or observed  Perception WDL  Hallucination None reported or observed  Judgment Poor  Confusion None  Danger to Self  Current suicidal ideation? Denies  Agreement Not to Harm Self Yes  Description of Agreement verbal  Danger to Others  Danger to Others None reported or observed

## 2022-06-21 NOTE — Group Note (Signed)
Date:  06/21/2022 Time:  12:31 PM  Group Topic/Focus:  Goals Group:   The focus of this group is to help patients establish daily goals to achieve during treatment and discuss how the patient can incorporate goal setting into their daily lives to aide in recovery. Orientation:   The focus of this group is to educate the patient on the purpose and policies of crisis stabilization and provide a format to answer questions about their admission.  The group details unit policies and expectations of patients while admitted.    Participation Level:  Active  Participation Quality:  Appropriate  Affect:  Appropriate  Cognitive:  Appropriate  Insight: Appropriate  Engagement in Group:  Engaged  Modes of Intervention:  Activity, Discussion, and Orientation  Additional Comments:   Pt attended and participated in the Orientation/Goals group.  Edmund Hilda Berdina Cheever 06/21/2022, 12:31 PM

## 2022-06-21 NOTE — Progress Notes (Signed)
[facility] MD Progress Note  06/21/2022 10:56 AM Sara [name]  MRN:  [id number]  Principal Problem: MDD (major depressive disorder), recurrent severe, without psychosis (HCC) Diagnosis: Principal Problem:   MDD (major depressive disorder), recurrent severe, without psychosis (HCC)   HPI: Sara [name] is a  47 year old AA female with past psychiatric history of polysubstance use disorder, MDD recurrent severe without psychosis, suicidal ideations, suicide attempts who presented to [facility] 06/17/22 voluntarily with complain of worsening depression triggering suicidal ideation with plan to work into traffic triggered by the recent death of her uncle (mother's brother) a month ago that she was unable to attend funeral and not being able refill psychotropic medications. She was assessed and recommended for inpatient hospitalization.     24 hour chart review: Staff report patient has been visible in the milieu interacting with peers and staff appropriately attending unit activities. No behavioral concerns. Medication compliant. Vital signs have been WNL with PR slightly elevated at 109. She has medical history of DM and HTN.    Assessment:  Patient presents casually dressed. Calm and cooperative. Euthymic mood, bright affect. She reports doing much better. Since restarting her medications. She reports since last admission she has been staying at an [address] and works full time at a [employer]. Denies any relapse or substance use. She reports dramatic improvement in her sleep and appetite. Denies any SI/HI or AVH and reports ongoing work on coping strategies with a plan to return to [facility] and follow up with outpatient for medication management upon discharge.   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 pylori infection    High risk medication use    Hypercholesterolemia    Hyperlipidemia, mixed    Hypertension    Other mixed anxiety disorders    Pollen allergies    Sickle cell trait (HCC)    Sleep apnea    Vitamin D deficiency     Past Surgical History:  Procedure Laterality Date   ABDOMINAL HYSTERECTOMY     APPENDECTOMY     COLONOSCOPY  08-16-2008   Dr. Vallarie Mare   ESOPHAGOGASTRODUODENOSCOPY  08-16-2008   Dr. Rayfield Citizen    Family History:  Family History  Problem Relation Age of Onset   Clotting disorder Mother    Crohn's disease Mother    Heart disease Mother    Colon cancer Father    Clotting disorder Brother    Diabetes Brother    Diabetes Maternal Aunt    Liver cancer Maternal Uncle    Prostate cancer Maternal Uncle    Colon cancer Maternal Uncle    Diabetes Paternal Aunt    Esophageal cancer Paternal Uncle    Family Psychiatric  History: as per H and P Social History:  Social History   Substance and Sexual Activity  Alcohol Use Not Currently     Social History   Substance and Sexual Activity  Drug Use Not Currently   Types: Marijuana, Cocaine   Comment: 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    Social History   Socioeconomic History   Marital status: Divorced  Spouse name: Not on file   Number of children: 2   Years of education: Not on file   Highest education level: Not on file  Occupational History   Occupation: n/a  Tobacco Use   Smoking status: Every Day    Packs/day: 1    Types: Cigarettes   Smokeless tobacco: Never  Substance and Sexual Activity   Alcohol use: Not Currently   Drug use: Not Currently    Types: Marijuana, Cocaine    Comment: 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   Sexual activity: Not on file  Other Topics Concern   Not on file  Social History Narrative   Not on file   Social Determinants of Health    Financial Resource Strain: Not on file  Food Insecurity: Food Insecurity Present (06/18/2022)   Hunger Vital Sign    Worried About Running Out of Food in the Last Year: Often true    Ran Out of Food in the Last Year: Often true  Transportation Needs: Unmet Transportation Needs (06/18/2022)   PRAPARE - Administrator, Civil Service (Medical): Yes    Lack of Transportation (Non-Medical): Yes  Physical Activity: Not on file  Stress: Not on file  Social Connections: Not on file   Additional Social History:  Multiple inpatient admissions; most recent [facility] 04/01/22  Sleep: fair  Appetite: fair  Current Medications: Current Facility-Administered Medications  Medication Dose Route Frequency Provider Last Rate Last Admin   albuterol (VENTOLIN HFA) 108 (90 Base) MCG/ACT inhaler 1-2 puff  1-2 puff Inhalation Q6H PRN Nwoko, Agnes I, NP       alum & mag hydroxide-simeth (MAALOX/MYLANTA) 200-200-20 MG/5ML suspension 30 mL  30 mL Oral Q4H PRN Onuoha, Chinwendu V, NP       ARIPiprazole (ABILIFY) tablet 10 mg  10 mg Oral QHS Nwoko, Agnes I, NP   10 mg at 06/20/22 2113   diphenhydrAMINE (BENADRYL) capsule 50 mg  50 mg Oral TID PRN Onuoha, Chinwendu V, NP       Or   diphenhydrAMINE (BENADRYL) injection 50 mg  50 mg Intramuscular TID PRN Onuoha, Chinwendu V, NP       docusate sodium (COLACE) capsule 100 mg  100 mg Oral BID Armandina Stammer I, NP   100 mg at 06/21/22 0748   FLUoxetine (PROZAC) capsule 20 mg  20 mg Oral Daily Nwoko, Nicole Kindred I, NP   20 mg at 06/21/22 0748   gabapentin (NEURONTIN) capsule 600 mg  600 mg Oral TID Armandina Stammer I, NP   600 mg at 06/21/22 0748   hydrOXYzine (ATARAX) tablet 25 mg  25 mg Oral TID PRN Onuoha, Chinwendu V, NP   25 mg at 06/19/22 1901   ibuprofen (ADVIL) tablet 400 mg  400 mg Oral Q4H PRN Rex Kras, MD   400 mg at 06/21/22 0955   insulin aspart (novoLOG) injection 0-9 Units  0-9 Units Subcutaneous TID WC Nwoko, Nicole Kindred I, NP   2 Units at 06/21/22 0109    insulin aspart (novoLOG) injection 5 Units  5 Units Subcutaneous TID WC Massengill, Harrold Donath, MD   5 Units at 06/21/22 0630   insulin glargine-yfgn (SEMGLEE) injection 14 Units  14 Units Subcutaneous Daily Massengill, Harrold Donath, MD   14 Units at 06/21/22 0748   LORazepam (ATIVAN) tablet 2 mg  2 mg Oral TID PRN Onuoha, Chinwendu V, NP       Or   LORazepam (ATIVAN) injection 2 mg  2 mg Intramuscular TID PRN  Onuoha, Chinwendu V, NP       magnesium hydroxide (MILK OF MAGNESIA) suspension 30 mL  30 mL Oral Daily PRN Onuoha, Chinwendu V, NP       nicotine polacrilex (NICORETTE) gum 2 mg  2 mg Oral PRN Armandina Stammer I, NP   2 mg at 06/20/22 1625   oxyCODONE-acetaminophen (PERCOCET/ROXICET) 5-325 MG per tablet 1 tablet  1 tablet Oral TID PRN Otho Bellows, RPH   1 tablet at 06/21/22 0750   And   oxyCODONE (Oxy IR/ROXICODONE) immediate release tablet 5 mg  5 mg Oral TID PRN Otho Bellows, RPH   5 mg at 06/21/22 0626   pantoprazole (PROTONIX) EC tablet 40 mg  40 mg Oral Daily Armandina Stammer I, NP   40 mg at 06/21/22 0748   propranolol (INDERAL) tablet 10 mg  10 mg Oral BID Armandina Stammer I, NP   10 mg at 06/20/22 1720   traZODone (DESYREL) tablet 100 mg  100 mg Oral QHS Armandina Stammer I, NP   100 mg at 06/20/22 2113    Lab Results:  Results for orders placed or performed during the hospital encounter of 06/18/22 (from the past 48 hour(s))  Glucose, capillary     Status: Abnormal   Collection Time: 06/19/22 12:05 PM  Result Value Ref Range   Glucose-Capillary 240 (H) 70 - 99 mg/dL    Comment: Glucose reference range applies only to samples taken after fasting for at least 8 hours.  Glucose, capillary     Status: Abnormal   Collection Time: 06/19/22  4:31 PM  Result Value Ref Range   Glucose-Capillary 170 (H) 70 - 99 mg/dL    Comment: Glucose reference range applies only to samples taken after fasting for at least 8 hours.  Glucose, capillary     Status: Abnormal   Collection Time: 06/20/22  6:00 AM  Result  Value Ref Range   Glucose-Capillary 197 (H) 70 - 99 mg/dL    Comment: Glucose reference range applies only to samples taken after fasting for at least 8 hours.  Glucose, capillary     Status: Abnormal   Collection Time: 06/20/22 11:51 AM  Result Value Ref Range   Glucose-Capillary 191 (H) 70 - 99 mg/dL    Comment: Glucose reference range applies only to samples taken after fasting for at least 8 hours.   Comment 1 Notify RN   Glucose, capillary     Status: Abnormal   Collection Time: 06/20/22  5:12 PM  Result Value Ref Range   Glucose-Capillary 201 (H) 70 - 99 mg/dL    Comment: Glucose reference range applies only to samples taken after fasting for at least 8 hours.   Comment 1 Notify RN   Glucose, capillary     Status: Abnormal   Collection Time: 06/20/22  9:05 PM  Result Value Ref Range   Glucose-Capillary 240 (H) 70 - 99 mg/dL    Comment: Glucose reference range applies only to samples taken after fasting for at least 8 hours.  Glucose, capillary     Status: Abnormal   Collection Time: 06/21/22  6:16 AM  Result Value Ref Range   Glucose-Capillary 189 (H) 70 - 99 mg/dL    Comment: Glucose reference range applies only to samples taken after fasting for at least 8 hours.    Blood Alcohol level:  Lab Results  Component Value Date   [facility] <10 06/17/2022   [facility]  12/11/2009    <5  LOWEST DETECTABLE LIMIT FOR SERUM ALCOHOL IS 5 mg/dL FOR MEDICAL PURPOSES ONLY    Metabolic Disorder Labs: Lab Results  Component Value Date   HGBA1C 12.7 (H) 02/01/2022   MPG 318 02/01/2022   No results found for: "PROLACTIN" Lab Results  Component Value Date   CHOL 204 (H) 02/01/2022   TRIG 61 02/01/2022   HDL 61 02/01/2022   CHOLHDL 3.3 02/01/2022   VLDL 12 02/01/2022   LDLCALC 131 (H) 02/01/2022    Physical Findings:   Psychiatric Specialty Exam: Physical Exam Constitutional:      Appearance: the patient is not toxic-appearing.  Pulmonary:     Effort: Pulmonary effort is  normal.  Neurological:     General: No focal deficit present.     Mental Status: the patient is alert and oriented to person, place, and time.   Review of Systems  Respiratory:  Negative for shortness of breath.   Cardiovascular:  Negative for chest pain.  Gastrointestinal:  Negative for abdominal pain, constipation, diarrhea, nausea and vomiting.  Neurological:  Negative for headaches.      BP 91/66 (BP Location: Right Arm)   Pulse (!) 109   Temp 98 F (36.7 C) (Oral)   Resp 18   Ht 5\' 4"  (1.626 m)   Wt 65.7 kg   SpO2 100%   BMI 24.85 kg/m   General Appearance: Fairly Groomed  Eye Contact:  Good  Speech:  Clear and Coherent  Volume:  Normal  Mood:  " better"  Affect:  Animated,Congruent  Thought Process:  Coherent  Orientation:  Full (Time, Place, and Person)  Thought Content: Logical   Suicidal Thoughts:  No  Homicidal Thoughts:  No  Memory:  Immediate;   Good  Judgement:  fair  Insight:  fair  Psychomotor Activity:  Normal  Concentration:  Concentration: Good  Recall:  Good  Fund of Knowledge: Good  Language: Good  Akathisia:  No  Handed:  not assessed  AIMS (if indicated): not done  Assets:  Communication Skills Desire for Improvement Financial Resources/Insurance Housing Leisure Time Physical Health  ADL's:  Intact  Cognition: WNL  Sleep:  Fair   Principal/active diagnoses. Major depressive disorder, recurrent episodes without psychotic features.  Hx. Alcohol use disorder.  Hx. Stimulant use disorder (cocaine).  Hx. Cannabis use disorder.  Plan: Completed: Diflucan 150 mg po once for yeast infection.  Continue: Abilify 10 mg PO daily for mood stability.  Prozac 20 mg PO daily for depression.  Gabapentin 600 mg PO TID for neuropathic pain.  Trazodone 100 mg po daily HS for insomnia.   Agitation protocol:  Benadryl 50 mg PO/IM, Haldol 5 mg PO/IM, Lorazepam 2 mg PO/IM TID PRN for agitation.    Other medical conditions.  Albuterol inhaler 1-2  puffs Q 6 hrs prn for SOB.  Colace 100 mg po bid for constipation.  Gabapentin 600 mg po tid for neuropathic pain. sliding insulin coverage for elevated blood sugar as recommended.  SEMGLEE insulin 14 units subs Q Q hs for DM. Protonix 40 mg po Q am for acid reflux.   PRNs Tylenol 650 mg every 6 hours PRN for mild pain Maalox 30 ml Q 4 hrs PRN for indigestion MOM 30 ml po Q 6 hrs for constipation Nicorette gum 2 mg PO PRN for nicotine withdrawal management. Hydroxyzine 25 mg PO TID PRN for anxiety.    Safety and Monitoring: Voluntary admission to inpatient psychiatric unit for safety, stabilization and treatment Daily contact with patient to  assess and evaluate symptoms and progress in treatment Patient's case to be discussed in multi-disciplinary team meeting Observation Level : q15 minute checks Vital signs: q12 hours Precautions: Safety   Discharge Planning: Social work and case management to assist with discharge planning and identification of hospital follow-up needs prior to discharge Estimated LOS: 5-7 days Discharge Concerns: Need to establish a safety plan; Medication compliance and effectiveness Discharge Goals: Return home with outpatient referrals for mental health follow-up including medication management/psychotherapy   Observation Level/Precautions:  15 minute checks  Laboratory:    Psychotherapy: Enrolled in the therapeutic milieu  Medications: See [facility]  Consultations: As needed.  Discharge Concerns: Safety, mood stability.  Estimated LOS: 3 to 5 days  Other: NA     Physician Treatment Plan for Primary Diagnosis: MDD (major depressive disorder), recurrent severe, without psychosis (HCC)   Long Term Goal(s): Improvement in symptoms so as ready for discharge   Short Term Goals: Ability to identify changes in lifestyle to reduce recurrence of condition will improve, Ability to verbalize feelings will improve, Ability to disclose and discuss suicidal ideas, and  Ability to demonstrate self-control will improve   I certify that inpatient services furnished can reasonably be expected to improve the patient's condition. Patient ID: Sara [name], female   DOB: 01-08-1976, 47 y.o.   MRN: [id number]

## 2022-06-22 LAB — GLUCOSE, CAPILLARY
Glucose-Capillary: 188 mg/dL — ABNORMAL HIGH (ref 70–99)
Glucose-Capillary: 221 mg/dL — ABNORMAL HIGH (ref 70–99)

## 2022-06-22 MED ORDER — PROPRANOLOL HCL 10 MG PO TABS: 10.0000 mg | ORAL_TABLET | Freq: Two times a day (BID) | ORAL | 0 refills | Status: DC

## 2022-06-22 MED ORDER — INSULIN GLARGINE-YFGN 100 UNIT/ML ~~LOC~~ SOLN: 14.0000 [IU] | Freq: Every day | SUBCUTANEOUS | 11 refills | Status: DC

## 2022-06-22 MED ORDER — ARIPIPRAZOLE 10 MG PO TABS: 10.0000 mg | ORAL_TABLET | Freq: Every day | ORAL | 0 refills | Status: DC

## 2022-06-22 MED ORDER — GABAPENTIN 300 MG PO CAPS: 600.0000 mg | ORAL_CAPSULE | Freq: Three times a day (TID) | ORAL | 0 refills | Status: DC

## 2022-06-22 MED ORDER — FLUOXETINE HCL 20 MG PO CAPS: 20.0000 mg | ORAL_CAPSULE | Freq: Every day | ORAL | 3 refills | Status: DC

## 2022-06-22 MED ORDER — NICOTINE POLACRILEX 2 MG MT GUM: 2.0000 mg | CHEWING_GUM | OROMUCOSAL | 0 refills | Status: DC | PRN

## 2022-06-22 NOTE — Progress Notes (Signed)
  [facility] Adult Case Management Discharge Plan :  Will you be returning to the same living situation after discharge:  Yes,  to home At discharge, do you have transportation home?: No. Bus pass will be provided at discharge Do you have the ability to pay for your medications: Yes,  has insurance  Release of information consent forms completed and in the chart;  Patient's signature needed at discharge.  Patient to Follow up at:  Follow-up Information     [facility] Follow up on 06/24/2022.   Specialty: Urgent Care Why: You have a scheduled medication management appointment with this provider @ 10AM  If you need therapy services , please go to there WALK-IN Hours Monday, Wednesday, and Thursday between 7:00AM-7:15am for a slot, [facility] [employer] information: [address] [place] [address] [phone]                Next level of care provider has access to [facility] Link:yes  Safety Planning and Suicide Prevention discussed: No. Pt declined consents      Has patient been referred to the Quitline?: Patient refused referral for treatment  Patient has been referred for addiction treatment: No known substance use disorder.  Otelia Santee, LCSW 06/22/2022, 10:51 AM

## 2022-06-22 NOTE — [facility] Group Notes (Signed)
[facility] Group Notes:  (Nursing/MHT/Case Management/Adjunct)  Date:  06/22/2022  Time:  4:45 AM  Type of Therapy:   Wrap-up group  Participation Level:  Active  Participation Quality:  Appropriate  Affect:  Appropriate  Cognitive:  Appropriate  Insight:  Appropriate  Engagement in Group:  Engaged  Modes of Intervention:  Education  Summary of Progress/Problems:Pt goal to go home, day 10/10.  Sara [name] 06/22/2022, 4:45 AM

## 2022-06-22 NOTE — Progress Notes (Signed)
   06/22/22 0555  15 Minute Checks  Location Bedroom  Visual Appearance Calm  Behavior Sleeping  Sleep (Behavioral Health Patients Only)  Calculate sleep? (Click Yes once per 24 hr at 0600 safety check) Yes  Documented sleep last 24 hours 8.25

## 2022-06-22 NOTE — [facility] Suicide Risk Assessment (Signed)
Suicide Risk Assessment  Discharge Assessment    [facility] Discharge Suicide Risk Assessment   Principal Problem: MDD (major depressive disorder), recurrent severe, without psychosis (HCC) Discharge Diagnoses: Principal Problem:   MDD (major depressive disorder), recurrent severe, without psychosis (HCC)  Sara [name] was admitted for MDD (major depressive disorder), recurrent severe, without psychosis (HCC) and crisis management.  Sara [name] was treated with Sara following medications see MAR.  Sara [name] was discharged with current medication and was instructed on how to take medications as prescribed; (details listed below under Medication List).  Medical problems were identified and treated as needed.  Home medications were restarted as appropriate.  Improvement was monitored by observation and Sara [name] report of symptom reduction.  Emotional and mental status was monitored by [name] self-inventory reports completed by Sara [name] and clinical staff.         Sara [name] by Sara treatment team for stability and plans for continued recovery upon discharge.  Sara [name] was an integral factor for scheduling further treatment.  Employment, transportation, bed availability, health status, family support, and any pending legal issues were also considered during her hospital stay.  Sara [name] was offered further treatment options upon discharge including but not limited to Residential, Intensive Outpatient, and Outpatient treatment.  Sara [name] with Sara services as listed below under Follow [name] Information.     Upon completion of this admission Sara [name] was both mentally and medically stable for discharge denying suicidal/homicidal ideation, auditory/visual/tactile hallucinations, delusional thoughts and paranoia.     Total Time spent with [name]: 30 minutes  Musculoskeletal: Strength & Muscle Tone: within normal limits Gait & Station:  normal [name] leans: N/A  Psychiatric Specialty Exam  Presentation  General Appearance:  Casual  Eye Contact: Good  Speech: Clear and Coherent; Normal Rate  Speech Volume: Normal  Handedness: Right   Mood and Affect  Mood: Euthymic  Duration of Depression Symptoms: No data recorded Affect: Congruent   Thought Process  Thought Processes: Coherent; Goal Directed; Linear  Descriptions of Associations:Intact  Orientation:Full (Time, Place and Person)  Thought Content:Logical  History of Schizophrenia/Schizoaffective disorder:No data recorded Duration of Psychotic Symptoms:No data recorded Hallucinations:Hallucinations: None  Ideas of Reference:None  Suicidal Thoughts:Suicidal Thoughts: No  Homicidal Thoughts:Homicidal Thoughts: No   Sensorium  Memory: Immediate Good; Recent Good  Judgment: Fair  Insight: Fair   Art therapist  Concentration: Fair  Attention Span: Fair  Recall: [employer] of Knowledge: Fair  Language: Fair   Psychomotor Activity  Psychomotor Activity: Psychomotor Activity: Normal   Assets  Assets: Communication Skills; Financial Resources/Insurance; Desire for Improvement; Resilience; Physical Health; Social Support   Sleep  Sleep: Sleep: Good   Physical Exam: Physical Exam Vitals and nursing note reviewed.  Constitutional:      Appearance: Sara [name] is normal weight.  HENT:     Head: Normocephalic.  Musculoskeletal:        General: Normal range of motion.     Cervical back: Normal range of motion.  Skin:    General: Skin is warm and dry.  Neurological:     Mental Status: Sara [name] is alert and oriented to person, place, and time.  Psychiatric:        Attention and Perception: Sara [name] does not perceive auditory or visual hallucinations.        Mood and Affect: Mood and affect normal.        Speech: Speech normal.  Behavior: Behavior normal. Behavior is cooperative.        Thought Content: Thought  content normal. Thought content is not paranoid or delusional. Thought content does not include homicidal or suicidal ideation. Thought content does not include homicidal or suicidal plan.        Cognition and Memory: Cognition and memory normal.        Judgment: Judgment normal.    Review of Systems  Psychiatric/Behavioral:  Negative for depression, hallucinations, substance abuse and suicidal ideas. Sara [name] is not nervous/anxious.    Blood pressure 96/73, pulse 97, temperature 97.9 F (36.6 C), temperature source Oral, resp. rate 16, height 5\' 4"  (1.626 m), weight 65.7 kg, SpO2 100 %. Body mass index is 24.85 kg/m.  Mental Status Per Nursing Assessment::   On Admission:  NA (pt currently denies SI)  Demographic Factors:  Low socioeconomic status  Loss Factors: NA  Historical Factors: Impulsivity  Risk Reduction Factors:   Sense of responsibility to family, Religious beliefs about death, Employed, Living with another person, especially a relative, Positive social support, and Positive therapeutic relationship  Continued Clinical Symptoms:  More than one psychiatric diagnosis  Cognitive Features That Contribute To Risk:  None    Suicide Risk:  Mild:  Suicidal ideation of limited frequency, intensity, duration, and specificity.  There are no identifiable plans, no associated intent, mild dysphoria and related symptoms, good self-control (both objective and subjective assessment), few other risk factors, and identifiable protective factors, including available and accessible social support.   Follow-[name] Information     [facility] Follow [name] on 06/24/2022.   Specialty: Urgent Care Why: You have a scheduled medication management appointment with this provider @ 10AM  If you need therapy services , please go to there WALK-IN Hours Monday, Wednesday, and Thursday between 7:00AM-7:15am for a slot, [facility] [employer] information: [phone] [place] [address] [phone]                Plan Of Care/Follow-[name] recommendations:  Activity: As tolerated  Diet: Heart healthy  Other: -Follow-[name] with your outpatient psychiatric provider -instructions on appointment date, time, and address (location) are provided to you in discharge paperwork.   -Take your psychiatric medications as prescribed at discharge - instructions are provided to you in Sara discharge paperwork.    -Follow-[name] with outpatient primary care doctor and other specialists -for management of preventative medicine and chronic medical disease, including:   -Testing: Follow-[name] with outpatient provider for abnormal lab results:  none   -Recommend abstinence from alcohol, tobacco, and other illicit drug use at discharge.    -If your psychiatric symptoms recur, worsen, or if you have side effects to your psychiatric medications, call your outpatient psychiatric provider, 911, [phone] or go to Sara nearest emergency department.   -If suicidal thoughts recur, call your outpatient psychiatric provider, 911, [phone] or go to Sara nearest emergency department.  Loletta Parish, NP 06/22/2022, 10:47 AM

## 2022-06-22 NOTE — Progress Notes (Addendum)
   06/21/22 2150  Psych Admission Type (Psych Patients Only)  Admission Status Voluntary  Psychosocial Assessment  Patient Complaints Anxiety  Eye Contact Fair  Facial Expression Animated  Affect Appropriate to circumstance  Speech Logical/coherent  Interaction Assertive  Motor Activity Other (Comment) (WNL)  Appearance/Hygiene Unremarkable  Behavior Characteristics Cooperative  Mood Pleasant  Thought Process  Coherency WDL  Content WDL  Delusions None reported or observed  Perception WDL  Hallucination None reported or observed  Judgment Impaired  Confusion None  Danger to Self  Current suicidal ideation? Denies  Agreement Not to Harm Self Yes  Description of Agreement Verbal contract for safety  Danger to Others  Danger to Others None reported or observed   Pt reports 7/10 anxiety and 0/10 depression. Pt is pleasant and cooperative. Pt was offered support and encouragement. Pt was given scheduled medications.  PRN Hydroxyzine and Nicotine gum given per MAR. Q 15 minute checks were done for safety. Pt attends groups and interacts well with peers and staff. Pt has no complaints.Pt receptive to treatment and safety maintained on unit.

## 2022-06-22 NOTE — Progress Notes (Signed)
Pt discharged at this time. Pt removed belongings, valuables, prescriptions, and verbalized understanding of medications and follow up care. Pt denies SI/HI/AVH. Pt left to take bus home.

## 2022-06-22 NOTE — Discharge Summary (Signed)
Physician Discharge Summary Note  Patient:  Sara [name] is an 47 y.o., female MRN:  [id number] DOB:  12/06/1975 Patient phone:  [phone] (home)  Patient address:   [address] [place] [address],  Total Time spent with patient: 45 minutes  Date of Admission:  06/18/2022 Date of Discharge: 06/22/2022  Reason for Admission:   HPI: Sara [name] is a  47 year old AA female with past psychiatric history of polysubstance use disorder, MDD recurrent severe without psychosis, suicidal ideations, suicide attempts who presented to [facility] 06/17/22 voluntarily with complain of worsening depression triggering suicidal ideation with plan to work into traffic triggered by the recent death of her uncle (mother's brother) a month ago that she was unable to attend funeral and not being able refill psychotropic medications. She was assessed and recommended for inpatient hospitalization.     24 hour chart review: Staff report patient has been visible in the milieu interacting with peers and staff appropriately attending unit activities. No behavioral concerns. Medication compliant. Vital signs have been WNL with PR slightly elevated at 97, some improvement from day prior; she continues to take Propanolol 10 mg BID. She has medical history of DM and HTN.    Assessment:  Patient presents casually dressed. Calm and cooperative. Euthymic mood, bright affect. She is smiling and inquiring about discharge. She continues to endorse stable mood with improved sleep and appetite. Denies any SI/HI or AVH and reports improvement of symptoms using coping strategies. She is asking for discharge today with a plan to return [facility] and follow up with outpatient for medication management upon discharge. She currently contracts for safety and denies any concerns.   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 pylori infection    High risk medication use    Hypercholesterolemia    Hyperlipidemia, mixed    Hypertension    Other mixed anxiety disorders    Pollen allergies    Sickle cell trait (HCC)    Sleep apnea    Vitamin D deficiency     Past Surgical History:  Procedure Laterality Date   ABDOMINAL HYSTERECTOMY     APPENDECTOMY     COLONOSCOPY  08-16-2008   Dr. Vallarie Mare   ESOPHAGOGASTRODUODENOSCOPY  08-16-2008   Dr. Rayfield Citizen    Family History:  Family History  Problem Relation Age of Onset   Clotting disorder Mother    Crohn's disease Mother    Heart disease Mother    Colon cancer Father    Clotting disorder Brother    Diabetes Brother    Diabetes Maternal Aunt    Liver cancer Maternal Uncle    Prostate cancer Maternal Uncle    Colon cancer Maternal Uncle    Diabetes Paternal Aunt    Esophageal cancer Paternal Uncle    Family Psychiatric  History: see H&P Social History:  Social History   Substance and Sexual Activity  Alcohol Use Not Currently     Social History   Substance and Sexual Activity  Drug Use Not  Currently   Types: Marijuana, Cocaine   Comment: 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    Social History   Socioeconomic History   Marital status: Divorced    Spouse name: Not on file   Number of children: 2   Years of education: Not on file   Highest education level: Not on file  Occupational History   Occupation: n/a  Tobacco Use   Smoking status: Every Day    Packs/day: 1    Types: Cigarettes   Smokeless tobacco: Never  Substance and Sexual Activity   Alcohol use: Not Currently   Drug  use: Not Currently    Types: Marijuana, Cocaine    Comment: 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   Sexual activity: Not on file  Other Topics Concern   Not on file  Social History Narrative   Not on file   Social Determinants of Health   Financial Resource Strain: Not on file  Food Insecurity: Food Insecurity Present (06/18/2022)   Hunger Vital Sign    Worried About Running Out of Food in the Last Year: Often true    Ran Out of Food in the Last Year: Often true  Transportation Needs: Unmet Transportation Needs (06/18/2022)   PRAPARE - Administrator, Civil Service (Medical): Yes    Lack of Transportation (Non-Medical): Yes  Physical Activity: Not on file  Stress: Not on file  Social Connections: Not on file    Hospital Course:   [name] was admitted for MDD (major depressive disorder), recurrent severe, without psychosis (HCC) and crisis management.  She was treated with the following medications see MAR.  [name] was discharged with current medication and was instructed on how to take medications as prescribed; (details listed below under Medication List).  Medical problems were identified and treated as needed.  Home medications were restarted as appropriate.  Improvement was monitored by observation and [name] daily report of symptom reduction.  Emotional and mental status was monitored by daily self-inventory reports completed by [name] and clinical staff.         [name] was evaluated by the treatment team for stability and plans for continued recovery upon discharge.  [name] motivation was an integral factor for scheduling further treatment.  Employment, transportation, bed availability, health status, family support, and any pending legal issues were also considered during her hospital stay.  She was offered further treatment options upon discharge including but not limited to Residential, Intensive Outpatient, and  Outpatient treatment.  [name] will follow up with the services as listed below under Follow Up Information.     Upon completion of this admission the [name] was both mentally and medically stable for discharge denying suicidal/homicidal ideation, auditory/visual/tactile hallucinations, delusional thoughts and paranoia.      Physical Findings: AIMS:  , ,  ,  ,    CIWA:    COWS:     Musculoskeletal: Strength & Muscle Tone: within normal limits Gait & Station: normal Patient leans: N/A   Psychiatric Specialty Exam:  Presentation  General Appearance:  Casual  Eye Contact: Good  Speech: Clear and Coherent; Normal Rate  Speech Volume: Normal  Handedness: Right   Mood and Affect  Mood: Euthymic  Affect: Congruent   Thought Process  Thought Processes: Coherent; Goal Directed; Linear  Descriptions of Associations:Intact  Orientation:Full (Time, Place and Person)  Thought Content:Logical  History of  Schizophrenia/Schizoaffective disorder:No data recorded Duration of Psychotic Symptoms:No data recorded Hallucinations:Hallucinations: None  Ideas of Reference:None  Suicidal Thoughts:Suicidal Thoughts: No  Homicidal Thoughts:Homicidal Thoughts: No   Sensorium  Memory: Immediate Good; Recent Good  Judgment: Fair  Insight: Fair   Chartered certified accountant: Fair  Attention Span: Fair  Recall: [employer] of Knowledge: Fair  Language: Fair   Psychomotor Activity  Psychomotor Activity: Psychomotor Activity: Normal   Assets  Assets: Manufacturing systems engineer; Financial Resources/Insurance; Desire for Improvement; Resilience; Physical Health; Social Support   Sleep  Sleep: Sleep: Good    Physical Exam: Physical Exam Vitals and nursing note reviewed.  Constitutional:      Appearance: She is normal weight.  HENT:     Head: Normocephalic.  Musculoskeletal:        General: Normal range of motion.      Cervical back: Normal range of motion.  Skin:    General: Skin is warm and dry.  Neurological:     Mental Status: She is alert and oriented to person, place, and time. Mental status is at baseline.  Psychiatric:        Attention and Perception: Attention and perception normal. She does not perceive auditory or visual hallucinations.        Mood and Affect: Mood and affect normal.        Speech: Speech normal.        Behavior: Behavior normal. Behavior is cooperative.        Thought Content: Thought content is not paranoid or delusional. Thought content does not include homicidal or suicidal ideation. Thought content does not include homicidal or suicidal plan.        Cognition and Memory: Cognition and memory normal.        Judgment: Judgment normal. Judgment is not impulsive or inappropriate.    Review of Systems  Psychiatric/Behavioral:  Negative for depression, substance abuse and suicidal ideas.    Blood pressure 96/73, pulse 97, temperature 97.9 F (36.6 C), temperature source Oral, resp. rate 16, height 5\' 4"  (1.626 m), weight 65.7 kg, SpO2 100 %. Body mass index is 24.85 kg/m.   Social History   Tobacco Use  Smoking Status Every Day   Packs/day: 1   Types: Cigarettes  Smokeless Tobacco Never   Tobacco Cessation:  A prescription for an FDA-approved tobacco cessation medication provided at discharge   Blood Alcohol level:  Lab Results  Component Value Date   ETH <10 06/17/2022   [facility]  12/11/2009    <5        LOWEST DETECTABLE LIMIT FOR SERUM ALCOHOL IS 5 mg/dL FOR MEDICAL PURPOSES ONLY    Metabolic Disorder Labs:  Lab Results  Component Value Date   HGBA1C 12.7 (H) 02/01/2022   MPG 318 02/01/2022   No results found for: "PROLACTIN" Lab Results  Component Value Date   CHOL 204 (H) 02/01/2022   TRIG 61 02/01/2022   HDL 61 02/01/2022   CHOLHDL 3.3 02/01/2022   VLDL 12 02/01/2022   LDLCALC 131 (H) 02/01/2022    See Psychiatric Specialty Exam and Suicide  Risk Assessment completed by Attending Physician prior to discharge.  Discharge destination:  Home  Is patient on multiple antipsychotic therapies at discharge:  No   Has Patient had three or more failed trials of antipsychotic monotherapy by history:  No  Recommended Plan for Multiple Antipsychotic Therapies: NA  Discharge Instructions     Diet - low sodium heart healthy   Complete by: As  directed    Increase activity slowly   Complete by: As directed       Allergies as of 06/22/2022       Reactions   Clarithromycin Itching, Rash   Doxycycline Anaphylaxis, Rash   Penicillins Anaphylaxis, Rash   Tetracyclines & Related Anaphylaxis   Metronidazole Itching, Rash   Tylenol [acetaminophen] Itching   Rx with Tylenol Elixir, suppose due to dye? Can take tablets        Medication List     STOP taking these medications    acetaminophen 500 MG tablet Commonly known as: TYLENOL   albuterol 108 (90 Base) MCG/ACT inhaler Commonly known as: VENTOLIN HFA   docusate sodium 100 MG capsule Commonly known as: COLACE   hydrOXYzine 25 MG tablet Commonly known as: ATARAX   ibuprofen 200 MG tablet Commonly known as: ADVIL   insulin aspart 100 UNIT/ML injection Commonly known as: novoLOG   Insulin Glargine w/ Trans Port 100 UNIT/ML Sopn   lisinopril 10 MG tablet Commonly known as: ZESTRIL   omeprazole 40 MG capsule Commonly known as: PRILOSEC   oxyCODONE-acetaminophen 10-325 MG tablet Commonly known as: PERCOCET   traZODone 100 MG tablet Commonly known as: DESYREL   traZODone 50 MG tablet Commonly known as: DESYREL       TAKE these medications      Indication  ARIPiprazole 10 MG tablet Commonly known as: ABILIFY Take 1 tablet (10 mg total) by mouth at bedtime. What changed: additional instructions  Indication: Major Depressive Disorder, Mood stabilization.   FLUoxetine 20 MG capsule Commonly known as: PROZAC Take 1 capsule (20 mg total) by mouth  daily. Start taking on: Jun 23, 2022 What changed: additional instructions  Indication: Major Depressive Disorder   gabapentin 300 MG capsule Commonly known as: NEURONTIN Take 2 capsules (600 mg total) by mouth 3 (three) times daily. What changed:  how much to take Another medication with the same name was removed. Continue taking this medication, and follow the directions you see here.  Indication: Diabetes with Nerve Disease   nicotine polacrilex 2 MG gum Commonly known as: NICORETTE Take 1 each (2 mg total) by mouth as needed for smoking cessation. What changed:  reasons to take this additional instructions  Indication: Nicotine Addiction   propranolol 10 MG tablet Commonly known as: INDERAL Take 1 tablet (10 mg total) by mouth 2 (two) times daily.  Indication: Elevated heart rate        Follow-up Information     [facility] Follow up on 06/24/2022.   Specialty: Urgent Care Why: You have a scheduled medication management appointment with this provider @ 10AM  If you need therapy services , please go to there WALK-IN Hours Monday, Wednesday, and Thursday between 7:00AM-7:15am for a slot, [facility], [facility] [employer] information: [phone] [place] [address] [phone]                Follow-up recommendations:   Activity: As tolerated  Diet: Heart healthy  Other: -Follow-up with your outpatient psychiatric provider -instructions on appointment date, time, and address (location) are provided to you in discharge paperwork.   -Take your psychiatric medications as prescribed at discharge - instructions are provided to you in the discharge paperwork.    -Follow-up with outpatient primary care doctor and other specialists -for management of preventative medicine and chronic medical disease, including:   -Testing: Follow-up with outpatient provider for abnormal lab results:  none   -Recommend abstinence from  alcohol, tobacco, and other illicit drug use at discharge.    -If your psychiatric symptoms recur, worsen, or if you have side effects to your psychiatric medications, call your outpatient psychiatric provider, 911, [phone] or go to the nearest emergency department.   -If suicidal thoughts recur, call your outpatient psychiatric provider, 911, [phone] or go to the nearest emergency department.  Comments:    Signed: Loletta Parish, NP 06/22/2022, 10:51 AM

## 2022-06-24 ENCOUNTER — Ambulatory Visit (HOSPITAL_COMMUNITY): Payer: No Typology Code available for payment source | Admitting: Psychiatry

## 2022-07-03 ENCOUNTER — Ambulatory Visit (HOSPITAL_COMMUNITY): Payer: No Typology Code available for payment source | Admitting: Psychiatry

## 2022-07-18 ENCOUNTER — Inpatient Hospital Stay (HOSPITAL_COMMUNITY)
Admission: AD | Admit: 2022-07-18 | Discharge: 2022-07-25 | DRG: 885 | Disposition: A | Discharge status: Home / Self Care | Payer: No Typology Code available for payment source | Source: Intra-hospital | Attending: Psychiatry | Admitting: Psychiatry

## 2022-07-18 DIAGNOSIS — Z888 Allergy status to other drugs, medicaments and biological substances status: Secondary | ICD-10-CM | POA: Diagnosis not present

## 2022-07-18 DIAGNOSIS — K5900 Constipation, unspecified: Secondary | ICD-10-CM | POA: Diagnosis present

## 2022-07-18 DIAGNOSIS — F4010 Social phobia, unspecified: Secondary | ICD-10-CM | POA: Diagnosis present

## 2022-07-18 DIAGNOSIS — Z5941 Food insecurity: Secondary | ICD-10-CM | POA: Diagnosis not present

## 2022-07-18 DIAGNOSIS — E1151 Type 2 diabetes mellitus with diabetic peripheral angiopathy without gangrene: Secondary | ICD-10-CM | POA: Diagnosis present

## 2022-07-18 DIAGNOSIS — G8929 Other chronic pain: Secondary | ICD-10-CM | POA: Diagnosis present

## 2022-07-18 DIAGNOSIS — Z833 Family history of diabetes mellitus: Secondary | ICD-10-CM

## 2022-07-18 DIAGNOSIS — Z9151 Personal history of suicidal behavior: Secondary | ICD-10-CM | POA: Diagnosis not present

## 2022-07-18 DIAGNOSIS — M545 Low back pain, unspecified: Secondary | ICD-10-CM | POA: Diagnosis present

## 2022-07-18 DIAGNOSIS — Z880 Allergy status to penicillin: Secondary | ICD-10-CM

## 2022-07-18 DIAGNOSIS — Z9102 Food additives allergy status: Secondary | ICD-10-CM | POA: Diagnosis not present

## 2022-07-18 DIAGNOSIS — Z8249 Family history of ischemic heart disease and other diseases of the circulatory system: Secondary | ICD-10-CM | POA: Diagnosis not present

## 2022-07-18 DIAGNOSIS — Z79899 Other long term (current) drug therapy: Secondary | ICD-10-CM | POA: Diagnosis not present

## 2022-07-18 DIAGNOSIS — Z5900 Homelessness unspecified: Secondary | ICD-10-CM

## 2022-07-18 DIAGNOSIS — F332 Major depressive disorder, recurrent severe without psychotic features: Principal | ICD-10-CM | POA: Diagnosis present

## 2022-07-18 DIAGNOSIS — R45851 Suicidal ideations: Secondary | ICD-10-CM | POA: Diagnosis present

## 2022-07-18 DIAGNOSIS — Z800 Family history of malignant neoplasm of digestive organs: Secondary | ICD-10-CM | POA: Diagnosis not present

## 2022-07-18 DIAGNOSIS — Z881 Allergy status to other antibiotic agents status: Secondary | ICD-10-CM

## 2022-07-18 DIAGNOSIS — G4730 Sleep apnea, unspecified: Secondary | ICD-10-CM | POA: Diagnosis present

## 2022-07-18 DIAGNOSIS — F329 Major depressive disorder, single episode, unspecified: Secondary | ICD-10-CM | POA: Diagnosis present

## 2022-07-18 DIAGNOSIS — I10 Essential (primary) hypertension: Secondary | ICD-10-CM | POA: Diagnosis present

## 2022-07-18 DIAGNOSIS — E782 Mixed hyperlipidemia: Secondary | ICD-10-CM | POA: Diagnosis present

## 2022-07-18 DIAGNOSIS — D573 Sickle-cell trait: Secondary | ICD-10-CM | POA: Diagnosis present

## 2022-07-18 DIAGNOSIS — Z5982 Transportation insecurity: Secondary | ICD-10-CM | POA: Diagnosis not present

## 2022-07-18 DIAGNOSIS — K219 Gastro-esophageal reflux disease without esophagitis: Secondary | ICD-10-CM | POA: Diagnosis present

## 2022-07-18 DIAGNOSIS — Z832 Family history of diseases of the blood and blood-forming organs and certain disorders involving the immune mechanism: Secondary | ICD-10-CM | POA: Diagnosis not present

## 2022-07-18 DIAGNOSIS — F17210 Nicotine dependence, cigarettes, uncomplicated: Secondary | ICD-10-CM | POA: Diagnosis present

## 2022-07-18 DIAGNOSIS — K760 Fatty (change of) liver, not elsewhere classified: Secondary | ICD-10-CM | POA: Diagnosis present

## 2022-07-18 DIAGNOSIS — F331 Major depressive disorder, recurrent, moderate: Secondary | ICD-10-CM | POA: Diagnosis not present

## 2022-07-18 LAB — GLUCOSE, CAPILLARY: Glucose-Capillary: 254 mg/dL — ABNORMAL HIGH (ref 70–99)

## 2022-07-18 MED ORDER — INSULIN ASPART 100 UNIT/ML IJ SOLN
0.0000 [IU] | Freq: Every day | INTRAMUSCULAR | Status: DC
  Administered 2022-07-18 – 2022-07-20 (×2): 3 [IU] via SUBCUTANEOUS
  Administered 2022-07-21 – 2022-07-22 (×2): 4 [IU] via SUBCUTANEOUS

## 2022-07-18 MED ORDER — HALOPERIDOL LACTATE 5 MG/ML IJ SOLN: 5.0000 mg | Freq: Three times a day (TID) | INTRAMUSCULAR | Status: DC | PRN

## 2022-07-18 MED ORDER — ALUM & MAG HYDROXIDE-SIMETH 200-200-20 MG/5ML PO SUSP: 30.0000 mL | ORAL | Status: DC | PRN

## 2022-07-18 MED ORDER — FLUOXETINE HCL 20 MG PO CAPS
20.0000 mg | ORAL_CAPSULE | Freq: Every day | ORAL | Status: DC
  Administered 2022-07-19 – 2022-07-25 (×7): 20 mg via ORAL
  Filled 2022-07-18 (×9): qty 1

## 2022-07-18 MED ORDER — INSULIN ASPART PROT & ASPART (70-30 MIX) 100 UNIT/ML ~~LOC~~ SUSP
9.0000 [IU] | Freq: Two times a day (BID) | SUBCUTANEOUS | Status: DC
  Administered 2022-07-19 – 2022-07-23 (×9): 9 [IU] via SUBCUTANEOUS

## 2022-07-18 MED ORDER — LORAZEPAM 2 MG/ML IJ SOLN: 2.0000 mg | Freq: Three times a day (TID) | INTRAMUSCULAR | Status: DC | PRN

## 2022-07-18 MED ORDER — MAGNESIUM HYDROXIDE 400 MG/5ML PO SUSP: 30.0000 mL | Freq: Every day | ORAL | Status: DC | PRN

## 2022-07-18 MED ORDER — DIPHENHYDRAMINE HCL 50 MG/ML IJ SOLN: 50.0000 mg | Freq: Three times a day (TID) | INTRAMUSCULAR | Status: DC | PRN

## 2022-07-18 MED ORDER — GABAPENTIN 300 MG PO CAPS
300.0000 mg | ORAL_CAPSULE | Freq: Three times a day (TID) | ORAL | Status: DC
  Administered 2022-07-19: 300 mg via ORAL
  Filled 2022-07-18 (×5): qty 1

## 2022-07-18 MED ORDER — TRAZODONE HCL 50 MG PO TABS
50.0000 mg | ORAL_TABLET | Freq: Every evening | ORAL | Status: DC | PRN
  Administered 2022-07-18 – 2022-07-24 (×6): 50 mg via ORAL
  Filled 2022-07-18 (×6): qty 1

## 2022-07-18 MED ORDER — ARIPIPRAZOLE 10 MG PO TABS
10.0000 mg | ORAL_TABLET | Freq: Every day | ORAL | Status: DC
  Administered 2022-07-19 – 2022-07-23 (×5): 10 mg via ORAL
  Filled 2022-07-18 (×8): qty 1

## 2022-07-18 MED ORDER — LORAZEPAM 1 MG PO TABS
2.0000 mg | ORAL_TABLET | Freq: Three times a day (TID) | ORAL | Status: DC | PRN
  Administered 2022-07-19: 2 mg via ORAL
  Filled 2022-07-18: qty 2

## 2022-07-18 MED ORDER — HALOPERIDOL 5 MG PO TABS: 5.0000 mg | ORAL_TABLET | Freq: Three times a day (TID) | ORAL | Status: DC | PRN

## 2022-07-18 MED ORDER — IBUPROFEN 600 MG PO TABS
600.0000 mg | ORAL_TABLET | Freq: Four times a day (QID) | ORAL | Status: DC | PRN
  Administered 2022-07-19 (×3): 600 mg via ORAL
  Filled 2022-07-18 (×3): qty 1

## 2022-07-18 MED ORDER — DIPHENHYDRAMINE HCL 25 MG PO CAPS: 50.0000 mg | ORAL_CAPSULE | Freq: Three times a day (TID) | ORAL | Status: DC | PRN

## 2022-07-18 MED ORDER — INSULIN ASPART 100 UNIT/ML IJ SOLN
0.0000 [IU] | Freq: Three times a day (TID) | INTRAMUSCULAR | Status: DC
  Administered 2022-07-19: 3 [IU] via SUBCUTANEOUS
  Administered 2022-07-19: 7 [IU] via SUBCUTANEOUS
  Administered 2022-07-19: 3 [IU] via SUBCUTANEOUS
  Administered 2022-07-20 (×2): 5 [IU] via SUBCUTANEOUS
  Administered 2022-07-21: 7 [IU] via SUBCUTANEOUS
  Administered 2022-07-21: 5 [IU] via SUBCUTANEOUS
  Administered 2022-07-21 – 2022-07-22 (×2): 3 [IU] via SUBCUTANEOUS
  Administered 2022-07-22: 2 [IU] via SUBCUTANEOUS
  Administered 2022-07-22: 3 [IU] via SUBCUTANEOUS
  Administered 2022-07-23: 7 [IU] via SUBCUTANEOUS
  Administered 2022-07-23: 3 [IU] via SUBCUTANEOUS
  Administered 2022-07-23: 7 [IU] via SUBCUTANEOUS

## 2022-07-18 MED ORDER — FLUCONAZOLE 150 MG PO TABS
150.0000 mg | ORAL_TABLET | Freq: Once | ORAL | Status: AC
  Administered 2022-07-18: 150 mg via ORAL
  Filled 2022-07-18 (×2): qty 1

## 2022-07-19 ENCOUNTER — Other Ambulatory Visit: Payer: Self-pay

## 2022-07-19 ENCOUNTER — Encounter (HOSPITAL_COMMUNITY): Payer: Self-pay | Admitting: Psychiatry

## 2022-07-19 DIAGNOSIS — F331 Major depressive disorder, recurrent, moderate: Secondary | ICD-10-CM | POA: Diagnosis not present

## 2022-07-19 LAB — GLUCOSE, CAPILLARY
Glucose-Capillary: 235 mg/dL — ABNORMAL HIGH (ref 70–99)
Glucose-Capillary: 244 mg/dL — ABNORMAL HIGH (ref 70–99)
Glucose-Capillary: 338 mg/dL — ABNORMAL HIGH (ref 70–99)

## 2022-07-19 MED ORDER — GABAPENTIN 300 MG PO CAPS
600.0000 mg | ORAL_CAPSULE | Freq: Three times a day (TID) | ORAL | Status: DC
  Administered 2022-07-19 – 2022-07-25 (×17): 600 mg via ORAL
  Filled 2022-07-19 (×22): qty 2
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
## 2022-07-19 NOTE — BHH Group Notes (Signed)
Pt did not attend goals group. 

## 2022-07-19 NOTE — Progress Notes (Signed)
   07/19/22 1357  Psych Admission Type (Psych Patients Only)  Admission Status Voluntary  Psychosocial Assessment  Patient Complaints Anxiety;Hopelessness;Sadness;Worrying  Eye Contact Fair  Facial Expression Anxious;Sad;Worried  Affect Anxious  Speech Logical/coherent  Interaction Assertive  Motor Activity Slow  Appearance/Hygiene In scrubs  Behavior Characteristics Cooperative;Appropriate to situation  Mood Depressed;Anxious  Aggressive Behavior  Targets Family;Property  Type of Behavior Verbal;Striking out  Effect No apparent injury  Thought Process  Coherency WDL  Content WDL  Delusions None reported or observed  Perception WDL  Hallucination None reported or observed  Judgment Poor  Confusion None  Danger to Self  Current suicidal ideation? Passive  Description of Suicide Plan to stop eating  Self-Injurious Behavior No self-injurious ideation or behavior indicators observed or expressed   Agreement Not to Harm Self Yes  Description of Agreement verbal  Danger to Others  Danger to Others None reported or observed
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
## 2022-07-19 NOTE — Progress Notes (Signed)
Pt refused CBG this evening.  

## 2022-07-19 NOTE — Tx Team (Signed)
Initial Treatment Plan 07/19/2022 1:03 AM Sara [name] ZOX:[id number]    PATIENT STRESSORS: Financial difficulties   Health problems   Marital or family conflict   Occupational concerns   Substance abuse     PATIENT STRENGTHS: Forensic psychologist fund of knowledge  Motivation for treatment/growth    PATIENT IDENTIFIED PROBLEMS: Suicidal ideation  Anxiety  "Get my medication right"  "Find a safe place to stay"               DISCHARGE CRITERIA:  Ability to meet basic life and health needs Adequate post-discharge living arrangements Improved stabilization in mood, thinking, and/or behavior Verbal commitment to aftercare and medication compliance  PRELIMINARY DISCHARGE PLAN: Attend aftercare/continuing care group Attend PHP/IOP Outpatient therapy Placement in alternative living arrangements  PATIENT/FAMILY INVOLVEMENT: This treatment plan has been presented to and reviewed with the patient, Sara [name], and/or family member.  The patient and family have been given the opportunity to ask questions and make suggestions.  Bethann Punches, RN 07/19/2022, 1:03 AM

## 2022-07-20 DIAGNOSIS — F331 Major depressive disorder, recurrent, moderate: Secondary | ICD-10-CM | POA: Diagnosis not present

## 2022-07-20 LAB — GLUCOSE, CAPILLARY
Glucose-Capillary: 266 mg/dL — ABNORMAL HIGH (ref 70–99)
Glucose-Capillary: 275 mg/dL — ABNORMAL HIGH (ref 70–99)
Glucose-Capillary: 276 mg/dL — ABNORMAL HIGH (ref 70–99)

## 2022-07-20 MED ORDER — OXYCODONE-ACETAMINOPHEN 5-325 MG PO TABS
1.0000 | ORAL_TABLET | Freq: Four times a day (QID) | ORAL | Status: DC
  Administered 2022-07-20 – 2022-07-25 (×20): 1 via ORAL
  Filled 2022-07-20 (×20): qty 1

## 2022-07-20 MED ORDER — NICOTINE POLACRILEX 2 MG MT GUM
2.0000 mg | CHEWING_GUM | OROMUCOSAL | Status: DC | PRN
  Administered 2022-07-20 – 2022-07-23 (×6): 2 mg via ORAL

## 2022-07-20 NOTE — Progress Notes (Signed)
Pt refused AM vital signs. 

## 2022-07-20 NOTE — Progress Notes (Signed)
Nursing Note: 0700-1900  Pt. depressed and sullen this am, stayed in bed stating that she was uncomfortable without percocet and did not want to live in this pain. Pt refused am meds initially, refused am CBG and insulin. Pt was later prescribed Percocet by Dr. Enedina Finner, from then- pt took all meds as prescribed including Insulin. Pts mood brightened throughout the shift, "I feel like I can make it now."  Pain in back decreased to 5/10 after first dose of percocet 5mg /325mg  which is pts goal pain level.   Denies A/V hallucinations and is able to verbally contract for safety.   CBG at noon: 266   CBG before dinner: 275   Pt. encouraged to verbalize needs and concerns, active listening and support provided.  Continued Q 15 minute safety checks.  Observed active participation in group settings this afternoon.   07/20/22 0800  Psych Admission Type (Psych Patients Only)  Admission Status Voluntary  Psychosocial Assessment  Patient Complaints Depression;Hopelessness  Eye Contact Fair  Facial Expression Sad  Affect Depressed  Speech Logical/coherent  Interaction Isolative  Motor Activity Slow  Appearance/Hygiene In hospital gown  Behavior Characteristics Resistant to care  Mood Depressed  Thought Process  Coherency WDL  Content Blaming others  Delusions None reported or observed  Perception WDL  Hallucination None reported or observed  Judgment Impaired  Confusion None  Danger to Self  Current suicidal ideation? Passive  Description of Suicide Plan "If my pain is not taken care of, I dont want to live."  Self-Injurious Behavior No self-injurious ideation or behavior indicators observed or expressed   Agreement Not to Harm Self Yes  Description of Agreement Verbal  Danger to Others  Danger to Others None reported or observed

## 2022-07-20 NOTE — BHH Group Notes (Signed)
LCSW Wellness Group Note   07/20/2022 09:30am  Type of Group and Topic: Psychoeducational Group:  Wellness  Participation Level:  did not attend  Description of Group  Wellness group introduces the topic and its focus on developing healthy habits across the spectrum and its relationship to a decrease in hospital admissions.  Six areas of wellness are discussed: physical, social spiritual, intellectual, occupational, and emotional.  Patients are asked to consider their current wellness habits and to identify areas of wellness where they are interested and able to focus on improvements.    Therapeutic Goals Patients will understand components of wellness and how they can positively impact overall health.  Patients will identify areas of wellness where they have developed good habits. Patients will identify areas of wellness where they would like to make improvements.    Summary of Patient Progress     Therapeutic Modalities: Cognitive Behavioral Therapy Psychoeducation    Catia Todorov Jon, LCSW  

## 2022-07-20 NOTE — Progress Notes (Signed)
   07/20/22 2956  15 Minute Checks  Location Bedroom  Visual Appearance Calm  Behavior Sleeping  Sleep (Behavioral Health Patients Only)  Calculate sleep? (Click Yes once per 24 hr at 0600 safety check) Yes  Documented sleep last 24 hours 9
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
## 2022-07-20 NOTE — Progress Notes (Signed)
   07/19/22 2030  Psych Admission Type (Psych Patients Only)  Admission Status Voluntary  Psychosocial Assessment  Patient Complaints Depression;Hopelessness;Worrying  Eye Contact Fair  Facial Expression Sad  Affect Depressed  Speech Logical/coherent  Interaction Isolative;Minimal  Motor Activity Slow  Appearance/Hygiene In hospital gown  Behavior Characteristics Resistant to care  Mood Depressed  Thought Process  Coherency WDL  Content Blaming others  Delusions None reported or observed  Perception WDL  Hallucination None reported or observed  Judgment Impaired  Confusion None  Danger to Self  Current suicidal ideation? Passive  Description of Suicide Plan Stop eating or overeating  Self-Injurious Behavior No self-injurious ideation or behavior indicators observed or expressed   Agreement Not to Harm Self Yes  Description of Agreement verbal  Danger to Others  Danger to Others None reported or observed   Pt reports 10/10 anxiety and depression. Pt was offered support and encouragement. Pt refused scheduled medications. Pt was encouraged to attend groups. Q 15 minute checks were done for safety. Pt did not attend group, isolative to bedroom. Minimally interactive with peers and staff. Pt complains of back pain, offered PRN Ibuprofen but pt declined. Safety maintained on unit.

## 2022-07-20 NOTE — Group Note (Signed)
Date:  07/20/2022 Time:  5:17 PM  Group Topic/Focus:  Wellness Toolbox:   The focus of this group is to discuss various aspects of wellness, balancing those aspects and exploring ways to increase the ability to experience wellness.  Patients will create a wellness toolbox for use upon discharge.    Participation Level:  Did Not Attend   Affect:      Cognitive:      Insight: None  Engagement in Group:    Modes of Intervention:      Additional Comments:     Reymundo Poll 07/20/2022, 5:17 PM

## 2022-07-20 NOTE — Group Note (Signed)
Date:  07/20/2022 Time:  12:08 PM  Group Topic/Focus:  Goals Group:   The focus of this group is to help patients establish daily goals to achieve during treatment and discuss how the patient can incorporate goal setting into their daily lives to aide in recovery.    Participation Level:  Did Not Attend  Participation Quality:      Affect:      Cognitive:      Insight: None  Engagement in Grou  Modes of Intervention:  Discussion  Additional Comments:     Reymundo Poll 07/20/2022, 12:08 PM

## 2022-07-20 NOTE — Progress Notes (Addendum)
Pt refused CBG check and Insulin dose tonight. Educated pt on importance of getting CBG check and insulin dose. Pt continued to decline. Notified Roselyn Bering, NP. Will continue to monitor.

## 2022-07-20 NOTE — Group Note (Signed)
Date:  07/20/2022 Time:  4:22 PM  Group Topic/Focus:  Goals Group:   The focus of this group is to help patients establish daily goals to achieve during treatment and discuss how the patient can incorporate goal setting into their daily lives to aide in recovery.    Participation Level:  Did Not Attend  Participation Quality:      Affect:      Cognitive:      Insight: None  Engagement in Group:      Modes of Intervention:      Additional Comments:     Reymundo Poll 07/20/2022, 4:22 PM

## 2022-07-21 ENCOUNTER — Encounter (HOSPITAL_COMMUNITY): Payer: Self-pay

## 2022-07-21 DIAGNOSIS — F331 Major depressive disorder, recurrent, moderate: Secondary | ICD-10-CM | POA: Diagnosis not present

## 2022-07-21 LAB — GLUCOSE, CAPILLARY
Glucose-Capillary: 274 mg/dL — ABNORMAL HIGH (ref 70–99)
Glucose-Capillary: 311 mg/dL — ABNORMAL HIGH (ref 70–99)
Glucose-Capillary: 243 mg/dL — ABNORMAL HIGH (ref 70–99)
Glucose-Capillary: 319 mg/dL — ABNORMAL HIGH (ref 70–99)

## 2022-07-21 LAB — RAPID URINE DRUG SCREEN, HOSP PERFORMED
Amphetamines: NOT DETECTED
Barbiturates: NOT DETECTED
Benzodiazepines: NOT DETECTED
Cocaine: NOT DETECTED
Opiates: NOT DETECTED
Tetrahydrocannabinol: NOT DETECTED

## 2022-07-21 MED ORDER — PSYLLIUM 95 % PO PACK
1.0000 | PACK | Freq: Every day | ORAL | Status: DC
  Administered 2022-07-21 – 2022-07-22 (×2): 1 via ORAL
  Filled 2022-07-21 (×6): qty 1

## 2022-07-21 NOTE — Group Note (Signed)
Date:  07/21/2022 Time:  1:10 PM  Group Topic/Focus:  Goals Group:   The focus of this group is to help patients establish daily goals to achieve during treatment and discuss how the patient can incorporate goal setting into their daily lives to aide in recovery.    Participation Level:  Active  Participation Quality:  Appropriate  Affect:  Appropriate  Cognitive:  Appropriate  Insight: Appropriate  Engagement in Group:  Engaged  Modes of Intervention:  Discussion  Additional Comments:     Reymundo Poll 07/21/2022, 1:10 PM

## 2022-07-21 NOTE — Progress Notes (Signed)
   07/21/22 2200  Psych Admission Type (Psych Patients Only)  Admission Status Voluntary  Psychosocial Assessment  Patient Complaints Anxiety  Eye Contact Fair  Facial Expression Animated  Affect Appropriate to circumstance  Speech Logical/coherent  Interaction Assertive  Motor Activity Slow  Appearance/Hygiene In scrubs  Behavior Characteristics Cooperative;Appropriate to situation  Mood Pleasant  Thought Process  Coherency WDL  Content WDL  Delusions None reported or observed  Perception WDL  Hallucination None reported or observed  Judgment Poor  Confusion None  Danger to Self  Current suicidal ideation? Denies  Self-Injurious Behavior No self-injurious ideation or behavior indicators observed or expressed   Agreement Not to Harm Self Yes  Description of Agreement verbal  Danger to Others  Danger to Others None reported or observed

## 2022-07-21 NOTE — Progress Notes (Signed)
   07/21/22 1018  Psych Admission Type (Psych Patients Only)  Admission Status Voluntary  Psychosocial Assessment  Patient Complaints Anxiety  Eye Contact Fair  Facial Expression Animated  Affect Anxious  Speech Logical/coherent  Interaction Assertive  Motor Activity Slow  Appearance/Hygiene In hospital gown  Behavior Characteristics Cooperative;Appropriate to situation  Mood Anxious  Thought Process  Coherency WDL  Content WDL  Delusions None reported or observed  Perception WDL  Hallucination None reported or observed  Judgment Impaired  Confusion None  Danger to Self  Current suicidal ideation? Denies  Self-Injurious Behavior No self-injurious ideation or behavior indicators observed or expressed   Agreement Not to Harm Self Yes  Description of Agreement verbal  Danger to Others  Danger to Others None reported or observed

## 2022-07-21 NOTE — Progress Notes (Signed)
   07/20/22 2120  Psych Admission Type (Psych Patients Only)  Admission Status Voluntary  Psychosocial Assessment  Patient Complaints Depression  Eye Contact Fair  Facial Expression Flat  Affect Depressed  Speech Logical/coherent  Interaction Assertive  Motor Activity Slow  Appearance/Hygiene In hospital gown  Behavior Characteristics Cooperative;Appropriate to situation  Mood Depressed  Thought Process  Coherency WDL  Content WDL  Delusions None reported or observed  Perception WDL  Hallucination None reported or observed  Judgment Impaired  Confusion None  Danger to Self  Current suicidal ideation? Denies  Self-Injurious Behavior No self-injurious ideation or behavior indicators observed or expressed   Agreement Not to Harm Self Yes  Description of Agreement verbal  Danger to Others  Danger to Others None reported or observed   Pt was offered support and encouragement. Pt was given scheduled medications. Given PRN Trazodone per MAR. Pt was encouraged to attend group. Q 15 minute checks were done for safety. Pt did not attend group, remained in room reading. Interacts well with peers and staff. Pt has no complaints.Pt receptive to treatment and safety maintained on unit.

## 2022-07-21 NOTE — Progress Notes (Signed)
Adult Psychoeducational Group Note  Date:  07/21/2022 Time:  10:18 PM  Group Topic/Focus:  Wrap-Up Group:   The focus of this group is to help patients review their daily goal of treatment and discuss progress on daily workbooks.  Participation Level:  Did Not Attend  Participation Quality:  Appropriate  Affect:  Appropriate  Cognitive:  Appropriate  Insight: Appropriate  Engagement in Group:  Engaged  Modes of Intervention:  Discussion  Additional Comments:  Pt. Did not attend group.  [name] 07/21/2022, 10:18 PM
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
## 2022-07-21 NOTE — Progress Notes (Signed)
Adult Psychoeducational Group Note  Date:  07/21/2022 Time:  1:11 AM  Group Topic/Focus:  Wrap-Up Group:   The focus of this group is to help patients review their daily goal of treatment and discuss progress on daily workbooks.  Participation Level:  Did Not Attend  Participation Quality:  Appropriate  Affect:  Appropriate  Cognitive:  Appropriate  Insight: Appropriate  Engagement in Group:  None  Modes of Intervention:  Discussion  Additional Comments:  Pt did not attend group.  [name] 07/21/2022, 1:11 AM

## 2022-07-21 NOTE — Group Note (Signed)
Recreation Therapy Group Note   Group Topic:Problem Solving  Group Date: 07/21/2022 Start Time: 0930 End Time: 1000 Facilitators: Naksh Radi-McCall, LRT,CTRS Location: [address] Dayroom   Goal Area(s) Addresses:  Patient will effectively work with peer towards shared goal.  Patient will identify skills used to make activity successful.  Patient will share challenges and verbalize solution-driven approaches used. Patient will identify how skills used during activity can be used to reach post d/c goals.   Group Description: [employer]. Patients were provided the following materials: 4 drinking straws, 5 rubber bands, 5 paper clips, 2 index cards and 2 drinking cups. Using the provided materials patients were asked to build a launching mechanism to launch a ping pong ball across the room, approximately 10 feet. Patients were divided into teams of 3-5. Instructions required all materials be incorporated into the device, functionality of items left to the peer group's discretion.   Affect/Mood: Appropriate   Participation Level: Engaged   Participation Quality: Independent   Behavior: Appropriate   Speech/Thought Process: Focused   Insight: Good   Judgement: Good   Modes of Intervention: STEM Activity   Patient Response to Interventions:  Engaged   Education Outcome:  Acknowledges education   Clinical Observations/Individualized Feedback: Pt was bright and focused during the activity.  Pt worked well with peers in completing the activity.     Plan: Continue to engage patient in RT group sessions 2-3x/week.   Dalin Caldera-McCall, LRT,CTRS 07/21/2022 12:43 PM
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
## 2022-07-21 NOTE — Group Note (Signed)
Date:  07/21/2022 Time:  1:59 PM  Group Topic/Focus:  Coping With Mental Health Crisis:   The purpose of this group is to help patients identify strategies for coping with mental health crisis.  Group discusses possible causes of crisis and ways to manage them effectively.    Participation Level:  Active  Participation Quality:  Appropriate  Affect:  Appropriate  Cognitive:  Appropriate  Insight: Appropriate  Engagement in Group:  Engaged  Modes of Intervention:  Education  Additional Comments:     Reymundo Poll 07/21/2022, 1:59 PM

## 2022-07-21 NOTE — BH IP Treatment Plan (Signed)
Interdisciplinary Treatment and Diagnostic Plan Update  07/21/2022 Time of Session: 11:45 AM  Sara [name] MRN: [id number]  Principal Diagnosis: MDD (major depressive disorder)  Secondary Diagnoses: Principal Problem:   MDD (major depressive disorder)   Current Medications:  Current Facility-Administered Medications  Medication Dose Route Frequency Provider Last Rate Last Admin   alum & mag hydroxide-simeth (MAALOX/MYLANTA) 200-200-20 MG/5ML suspension 30 mL  30 mL Oral Q4H PRN Oneta Rack, NP       ARIPiprazole (ABILIFY) tablet 10 mg  10 mg Oral Daily Oneta Rack, NP   10 mg at 07/21/22 0810   diphenhydrAMINE (BENADRYL) capsule 50 mg  50 mg Oral TID PRN Oneta Rack, NP       Or   diphenhydrAMINE (BENADRYL) injection 50 mg  50 mg Intramuscular TID PRN Oneta Rack, NP       FLUoxetine (PROZAC) capsule 20 mg  20 mg Oral Daily Oneta Rack, NP   20 mg at 07/21/22 0810   gabapentin (NEURONTIN) capsule 600 mg  600 mg Oral TID Rex Kras, MD   600 mg at 07/21/22 1142   haloperidol (HALDOL) tablet 5 mg  5 mg Oral TID PRN Oneta Rack, NP       Or   haloperidol lactate (HALDOL) injection 5 mg  5 mg Intramuscular TID PRN Oneta Rack, NP       ibuprofen (ADVIL) tablet 600 mg  600 mg Oral Q6H PRN Bobbitt, Shalon E, NP   600 mg at 07/19/22 1451   insulin aspart (novoLOG) injection 0-5 Units  0-5 Units Subcutaneous QHS Oneta Rack, NP   3 Units at 07/20/22 2121   insulin aspart (novoLOG) injection 0-9 Units  0-9 Units Subcutaneous TID WC Oneta Rack, NP   7 Units at 07/21/22 1142   insulin aspart protamine- aspart (NOVOLOG MIX 70/30) injection 9 Units  9 Units Subcutaneous BID WC Oneta Rack, NP   9 Units at 07/21/22 0811   LORazepam (ATIVAN) tablet 2 mg  2 mg Oral TID PRN Oneta Rack, NP   2 mg at 07/19/22 1708   Or   LORazepam (ATIVAN) injection 2 mg  2 mg Intramuscular TID PRN Oneta Rack, NP       magnesium hydroxide (MILK OF MAGNESIA)  suspension 30 mL  30 mL Oral Daily PRN Oneta Rack, NP       nicotine polacrilex (NICORETTE) gum 2 mg  2 mg Oral PRN Rex Kras, MD   2 mg at 07/21/22 1147   oxyCODONE-acetaminophen (PERCOCET/ROXICET) 5-325 MG per tablet 1 tablet  1 tablet Oral QID Rex Kras, MD   1 tablet at 07/21/22 1142   psyllium (HYDROCIL/METAMUCIL) 1 packet  1 packet Oral Daily Rex Kras, MD       traZODone (DESYREL) tablet 50 mg  50 mg Oral QHS PRN Oneta Rack, NP   50 mg at 07/20/22 2120   PTA Medications: Medications Prior to Admission  Medication Sig Dispense Refill Last Dose   albuterol (VENTOLIN HFA) 108 (90 Base) MCG/ACT inhaler Inhale 1-2 puffs into the lungs every 6 (six) hours as needed for wheezing or shortness of breath.   Past Week   ARIPiprazole (ABILIFY) 10 MG tablet Take 1 tablet (10 mg total) by mouth at bedtime. 30 tablet 0 07/17/2022   diphenhydrAMINE (BENADRYL) 25 mg capsule Take 25 mg by mouth every 6 (six) hours as needed for allergies.      docusate sodium (  COLACE) 100 MG capsule Take 100 mg by mouth 2 (two) times daily.   07/17/2022   FLUoxetine (PROZAC) 20 MG capsule Take 1 capsule (20 mg total) by mouth daily. 30 capsule 3 07/17/2022   gabapentin (NEURONTIN) 300 MG capsule Take 2 capsules (600 mg total) by mouth 3 (three) times daily. 120 capsule 0 07/17/2022   omeprazole (PRILOSEC) 40 MG capsule Take 40 mg by mouth daily.   07/17/2022   oxyCODONE-acetaminophen (PERCOCET) 10-325 MG tablet Take 1 tablet by mouth 3 (three) times daily.   07/17/2022   propranolol (INDERAL) 10 MG tablet Take 1 tablet (10 mg total) by mouth 2 (two) times daily. 30 tablet 0 07/17/2022   traZODone (DESYREL) 100 MG tablet Take 100 mg by mouth at bedtime.   07/17/2022    Patient Stressors: Financial difficulties   Health problems   Marital or family conflict   Occupational concerns   Substance abuse    Patient Strengths: Careers information officer for  treatment/growth   Treatment Modalities: Medication Management, Group therapy, Case management,  1 to 1 session with clinician, Psychoeducation, Recreational therapy.   Physician Treatment Plan for Primary Diagnosis: MDD (major depressive disorder) Long Term Goal(s): Improvement in symptoms so as ready for discharge   Short Term Goals: Ability to maintain clinical measurements within normal limits will improve Compliance with prescribed medications will improve Ability to verbalize feelings will improve Ability to disclose and discuss suicidal ideas Ability to demonstrate self-control will improve Ability to identify and develop effective coping behaviors will improve  Medication Management: Evaluate patient's response, side effects, and tolerance of medication regimen.  Therapeutic Interventions: 1 to 1 sessions, Unit Group sessions and Medication administration.  Evaluation of Outcomes: Progressing  Physician Treatment Plan for Secondary Diagnosis: Principal Problem:   MDD (major depressive disorder)  Long Term Goal(s): Improvement in symptoms so as ready for discharge   Short Term Goals: Ability to maintain clinical measurements within normal limits will improve Compliance with prescribed medications will improve Ability to verbalize feelings will improve Ability to disclose and discuss suicidal ideas Ability to demonstrate self-control will improve Ability to identify and develop effective coping behaviors will improve     Medication Management: Evaluate patient's response, side effects, and tolerance of medication regimen.  Therapeutic Interventions: 1 to 1 sessions, Unit Group sessions and Medication administration.  Evaluation of Outcomes: Progressing   RN Treatment Plan for Primary Diagnosis: MDD (major depressive disorder) Long Term Goal(s): Knowledge of disease and therapeutic regimen to maintain health will improve  Short Term Goals: Ability to remain free from  injury will improve, Ability to verbalize frustration and anger appropriately will improve, Ability to demonstrate self-control, Ability to participate in decision making will improve, Ability to verbalize feelings will improve, Ability to disclose and discuss suicidal ideas, Ability to identify and develop effective coping behaviors will improve, and Compliance with prescribed medications will improve  Medication Management: RN will administer medications as ordered by provider, will assess and evaluate patient's response and provide education to patient for prescribed medication. RN will report any adverse and/or side effects to prescribing provider.  Therapeutic Interventions: 1 on 1 counseling sessions, Psychoeducation, Medication administration, Evaluate responses to treatment, Monitor vital signs and CBGs as ordered, Perform/monitor CIWA, COWS, AIMS and Fall Risk screenings as ordered, Perform wound care treatments as ordered.  Evaluation of Outcomes: Progressing   LCSW Treatment Plan for Primary Diagnosis: MDD (major depressive disorder) Long Term Goal(s): Safe transition to appropriate next  level of care at discharge, Engage patient in therapeutic group addressing interpersonal concerns.  Short Term Goals: Engage patient in aftercare planning with referrals and resources, Increase social support, Increase ability to appropriately verbalize feelings, Increase emotional regulation, Facilitate acceptance of mental health diagnosis and concerns, Facilitate patient progression through stages of change regarding substance use diagnoses and concerns, Identify triggers associated with mental health/substance abuse issues, and Increase skills for wellness and recovery  Therapeutic Interventions: Assess for all discharge needs, 1 to 1 time with Social worker, Explore available resources and support systems, Assess for adequacy in community support network, Educate family and significant other(s) on  suicide prevention, Complete Psychosocial Assessment, Interpersonal group therapy.  Evaluation of Outcomes: Progressing   Progress in Treatment: Attending groups: Yes. Participating in groups: Yes. Taking medication as prescribed: Yes. Toleration medication: Yes. Family/Significant other contact made: No, will contact:  Yolani Vo (son) [phone] Patient understands diagnosis: Yes. Discussing patient identified problems/goals with staff: Yes. Medical problems stabilized or resolved: Yes. Denies suicidal/homicidal ideation: Yes. Issues/concerns per patient self-inventory: No.   New problem(s) identified: No, Describe:  None reported   New Short Term/Long Term Goal(s): medication stabilization, elimination of SI thoughts, development of comprehensive mental wellness plan.    Patient Goals:  " Medication Adjustment, another [employer] in [place] and follow up with [facility] for outpatient services "   Discharge Plan or Barriers: Patient recently admitted. CSW will continue to follow and assess for appropriate referrals and possible discharge planning.    Reason for Continuation of Hospitalization: Anxiety Depression Medication stabilization Suicidal ideation  Estimated Length of Stay: 3-5 days   Last 3 [place] Suicide Severity Risk Score: Flowsheet Row [facility] Ad[facility] (Discharged) [facility] from [facility]17/2022 [name]  C-SSRS RISK CATEGORY High Risk Moderate Risk No Risk       Last PHQ 2/9 Scores:     No data to display          Scribe for Treatment Team: Beather Arbour 07/21/2022 2:42 PM

## 2022-07-22 DIAGNOSIS — F331 Major depressive disorder, recurrent, moderate: Secondary | ICD-10-CM | POA: Diagnosis not present

## 2022-07-22 LAB — GLUCOSE, CAPILLARY
Glucose-Capillary: 195 mg/dL — ABNORMAL HIGH (ref 70–99)
Glucose-Capillary: 209 mg/dL — ABNORMAL HIGH (ref 70–99)
Glucose-Capillary: 219 mg/dL — ABNORMAL HIGH (ref 70–99)
Glucose-Capillary: 306 mg/dL — ABNORMAL HIGH (ref 70–99)

## 2022-07-22 NOTE — Group Note (Signed)
Date:  07/22/2022 Time:  12:19 PM  Group Topic/Focus:  Coping With Mental Health Crisis:   The purpose of this group is to help patients identify strategies for coping with mental health crisis.  Group discusses possible causes of crisis and ways to manage them effectively. Early Warning Signs:   The focus of this group is to help patients identify signs or symptoms they exhibit before slipping into an unhealthy state or crisis.    Participation Level:  Active  Participation Quality:  Appropriate  Affect:  Appropriate  Cognitive:  Appropriate  Insight: Appropriate  Engagement in Group:  Engaged  Modes of Intervention:  Discussion, Exploration, Socialization, and Support  Additional Comments:    Memory Dance Trumaine Wimer 07/22/2022, 12:19 PM

## 2022-07-22 NOTE — Progress Notes (Signed)
   07/22/22 0750  Psych Admission Type (Psych Patients Only)  Admission Status Voluntary  Psychosocial Assessment  Patient Complaints None  Eye Contact Fair  Facial Expression Animated  Affect Appropriate to circumstance  Speech Logical/coherent  Interaction Assertive  Motor Activity Slow  Appearance/Hygiene Unremarkable  Behavior Characteristics Cooperative;Appropriate to situation  Mood Pleasant  Thought Process  Coherency WDL  Content WDL  Delusions None reported or observed  Perception WDL  Hallucination None reported or observed  Judgment Poor  Confusion None  Danger to Self  Current suicidal ideation? Denies  Agreement Not to Harm Self Yes  Description of Agreement Verbal  Danger to Others  Danger to Others None reported or observed

## 2022-07-22 NOTE — Group Note (Signed)
Recreation Therapy Group Note   Group Topic:Animal Assisted Therapy   Group Date: 07/22/2022 Start Time: 0945 End Time: 1031 Facilitators: Takiera Mayo-McCall, LRT,CTRS Location: [address] Dayroom   Animal-Assisted Activity (AAA) Program Checklist/Progress Notes Patient Eligibility Criteria Checklist & Daily Group note for Rec Tx Intervention  AAA/T Program Assumption of Risk Form signed by Patient/ or Parent Legal Guardian Yes  Patient understands his/her participation is voluntary Yes   Affect/Mood: N/A   Participation Level: Did not attend    Clinical Observations/Individualized Feedback:     Plan: Continue to engage patient in RT group sessions 2-3x/week.   Carolan Avedisian-McCall, LRT,CTRS 07/22/2022 1:25 PM
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
## 2022-07-22 NOTE — [facility] Suicide Risk Assessment (Signed)
[facility] INPATIENT:  Family/Significant Other Suicide Prevention Education  Suicide Prevention Education:  Education Completed; Naveen Lorusso (son) [phone],  (name of family member/significant other) has been identified by the patient as the family member/significant other with whom the patient will be residing, and identified as the person(s) who will aid the patient in the event of a mental health crisis (suicidal ideations/suicide attempt).  With written consent from the patient, the family member/significant other has been provided the following suicide prevention education, prior to the and/or following the discharge of the patient.  Safety planning was completed with son. Son shared that he has not spoken to patient in about 4-days and they usually talk every day . Son states that he did not know what occurred with the [place] situation but was going to find out . Son assured that patient only option for DC would be a shelter if cousin cannot accept her to stay with him. Also, confirmed that patient does not have access to any guns or weapons.   The suicide prevention education provided includes the following: Suicide risk factors Suicide prevention and interventions National Suicide Hotline telephone number [facility] assessment telephone number [facility] Emergency Assistance 911 [facility] and/or Residential Mobile Crisis Unit telephone number  Request made of family/significant other to: Remove weapons (e.g., guns, rifles, knives), all items previously/currently identified as safety concern.   Remove drugs/medications (over-the-counter, prescriptions, illicit drugs), all items previously/currently identified as a safety concern.  The family member/significant other verbalizes understanding of the suicide prevention education information provided.  The family member/significant other agrees to remove the items of safety concern listed above.  [name] 07/22/2022, 4:07 PM

## 2022-07-22 NOTE — Plan of Care (Signed)
  Problem: Coping: Goal: Ability to adjust to condition or change in health will improve Outcome: Progressing   Problem: Health Behavior/Discharge Planning: Goal: Ability to manage health-related needs will improve Outcome: Progressing   Problem: Coping: Goal: Ability to identify and develop effective coping behavior will improve Outcome: Progressing

## 2022-07-22 NOTE — Inpatient Diabetes Management (Signed)
Inpatient Diabetes Program Recommendations  AACE/ADA: New Consensus Statement on Inpatient Glycemic Control (2015)  Target Ranges:  Prepandial:   less than 140 mg/dL      Peak postprandial:   less than 180 mg/dL (1-2 hours)      Critically ill patients:  140 - 180 mg/dL   Lab Results  Component Value Date   GLUCAP 195 (H) 07/22/2022   HGBA1C 12.7 (H) 02/01/2022    Review of Glycemic Control  Diabetes history: DM2 Outpatient Diabetes medications: 70/30 9 units BID Current orders for Inpatient glycemic control: 70/30 9 units BID, Novolog 0-9 units TID with meals and 0-5 HS  HgbA1C - 12.7%  Inpatient Diabetes Program Recommendations:    Consider increasing Novolog to 0-15 TID with meals and 0-5 HS  Consider increasing 70/30 to 10 units BID  Continue to follow.  Thank you. Ailene Ards, RD, LDN, CDCES Inpatient Diabetes Coordinator [phone]

## 2022-07-23 DIAGNOSIS — F332 Major depressive disorder, recurrent severe without psychotic features: Secondary | ICD-10-CM | POA: Diagnosis not present

## 2022-07-23 LAB — GLUCOSE, CAPILLARY
Glucose-Capillary: 326 mg/dL — ABNORMAL HIGH (ref 70–99)
Glucose-Capillary: 222 mg/dL — ABNORMAL HIGH (ref 70–99)
Glucose-Capillary: 302 mg/dL — ABNORMAL HIGH (ref 70–99)
Glucose-Capillary: 259 mg/dL — ABNORMAL HIGH (ref 70–99)

## 2022-07-23 MED ORDER — INSULIN ASPART 100 UNIT/ML IJ SOLN
0.0000 [IU] | Freq: Every day | INTRAMUSCULAR | Status: DC
  Administered 2022-07-23: 3 [IU] via SUBCUTANEOUS

## 2022-07-23 MED ORDER — INSULIN ASPART 100 UNIT/ML IJ SOLN
0.0000 [IU] | Freq: Three times a day (TID) | INTRAMUSCULAR | Status: DC
  Administered 2022-07-24: 5 [IU] via SUBCUTANEOUS
  Administered 2022-07-24 (×2): 8 [IU] via SUBCUTANEOUS
  Administered 2022-07-25: 5 [IU] via SUBCUTANEOUS

## 2022-07-23 MED ORDER — INSULIN ASPART PROT & ASPART (70-30 MIX) 100 UNIT/ML ~~LOC~~ SUSP
12.0000 [IU] | Freq: Two times a day (BID) | SUBCUTANEOUS | Status: DC
  Administered 2022-07-24 – 2022-07-25 (×3): 12 [IU] via SUBCUTANEOUS

## 2022-07-23 MED ORDER — ARIPIPRAZOLE 10 MG PO TABS
10.0000 mg | ORAL_TABLET | Freq: Every day | ORAL | Status: DC
  Administered 2022-07-24: 10 mg via ORAL
  Filled 2022-07-23 (×3): qty 1

## 2022-07-23 MED ORDER — METOPROLOL SUCCINATE 12.5 MG HALF TABLET
12.5000 mg | ORAL_TABLET | Freq: Every day | ORAL | Status: DC
  Administered 2022-07-24 – 2022-07-25 (×2): 12.5 mg via ORAL
  Filled 2022-07-23 (×4): qty 1

## 2022-07-23 NOTE — Plan of Care (Signed)
  Problem: Coping: Goal: Ability to adjust to condition or change in health will improve Outcome: Progressing   Problem: Nutritional: Goal: Maintenance of adequate nutrition will improve Outcome: Progressing   Problem: Coping: Goal: Ability to identify and develop effective coping behavior will improve Outcome: Progressing

## 2022-07-23 NOTE — Group Note (Signed)
Recreation Therapy Group Note   Group Topic:Stress Management  Group Date: 07/23/2022 Start Time: 0935 End Time: 1000 Facilitators: Olanna Percifield-McCall, LRT,CTRS Location: [address] Dayroom   Goal Area(s) Addresses:  Patient will actively participate in stress management techniques presented during session.  Patient will successfully identify benefit of practicing stress management post d/c.   Group Description: Guided Imagery. LRT provided education, instruction, and demonstration on practice of visualization via guided imagery. Patient was asked to participate in the technique introduced during session. LRT debriefed including topics of mindfulness, stress management and specific scenarios each patient could use these techniques. Patients were given suggestions of ways to access scripts post d/c and encouraged to explore Youtube and other apps available on smartphones, tablets, and computers.   Affect/Mood: Appropriate   Participation Level: Engaged   Participation Quality: Independent   Behavior: Appropriate   Speech/Thought Process: Focused   Insight: Good   Judgement: Good   Modes of Intervention: Script, [place]   Patient Response to Interventions:  Engaged   Education Outcome:  Acknowledges education   Clinical Observations/Individualized Feedback: Pt attended and participated in group session.     Plan: Continue to engage patient in RT group sessions 2-3x/week.   Clydene Burack-McCall, LRT,CTRS  07/23/2022 11:53 AM

## 2022-07-23 NOTE — Progress Notes (Signed)
   07/23/22 0900  Psych Admission Type (Psych Patients Only)  Admission Status Voluntary  Psychosocial Assessment  Patient Complaints None  Eye Contact Fair  Facial Expression Animated  Affect Appropriate to circumstance  Speech Logical/coherent  Interaction Assertive  Motor Activity Other (Comment) (WNL)  Appearance/Hygiene Unremarkable  Behavior Characteristics Cooperative;Appropriate to situation  Mood Pleasant  Thought Process  Coherency WDL  Content WDL  Delusions None reported or observed  Perception WDL  Hallucination None reported or observed  Judgment Poor  Confusion None  Danger to Self  Current suicidal ideation? Denies  Agreement Not to Harm Self Yes  Description of Agreement verbal  Danger to Others  Danger to Others None reported or observed

## 2022-07-23 NOTE — Progress Notes (Signed)
   07/23/22 2115  Psych Admission Type (Psych Patients Only)  Admission Status Voluntary  Psychosocial Assessment  Patient Complaints None  Eye Contact Fair  Facial Expression Animated  Affect Appropriate to circumstance  Speech Logical/coherent  Interaction Assertive  Motor Activity Other (Comment) (WDL)  Appearance/Hygiene Unremarkable  Behavior Characteristics Cooperative;Appropriate to situation  Mood Pleasant  Thought Process  Coherency WDL  Content WDL  Delusions None reported or observed  Perception WDL  Hallucination None reported or observed  Judgment Poor  Confusion None  Danger to Self  Current suicidal ideation? Denies  Agreement Not to Harm Self Yes  Description of Agreement verbal  Danger to Others  Danger to Others None reported or observed

## 2022-07-23 NOTE — [facility] Group Notes (Signed)
Psychoeducational Group Note  Date:  07/23/2022 Time:  2000  Group Topic/Focus:  Narcotics Anonymous Meeting  Participation Level: Did Not Attend  Participation Quality:  Not Applicable  Affect:  Not Applicable  Cognitive:  Not Applicable  Insight:  Not Applicable  Engagement in Group: Not Applicable  Additional Comments:  Did not attend.   Marcille Buffy 07/23/2022, 10:27 PM

## 2022-07-23 NOTE — Progress Notes (Signed)
Adult Psychoeducational Group Note  Date:  07/23/2022 Time:  1:11 AM  Group Topic/Focus:  Wrap-Up Group:   The focus of this group is to help patients review their daily goal of treatment and discuss progress on daily workbooks.  Participation Level:  Active  Participation Quality:  Appropriate  Affect:  Appropriate  Cognitive:  Appropriate  Insight: Appropriate  Engagement in Group:  Engaged  Modes of Intervention:  Discussion  Additional Comments: Pt stated day was 10 out of 10. Pt stated they accomplished goal of going to all group meeting.  Sara [name] 07/23/2022, 1:11 AM

## 2022-07-23 NOTE — Inpatient Diabetes Management (Signed)
Inpatient Diabetes Program Recommendations  AACE/ADA: New Consensus Statement on Inpatient Glycemic Control (2015)  Target Ranges:  Prepandial:   less than 140 mg/dL      Peak postprandial:   less than 180 mg/dL (1-2 hours)      Critically ill patients:  140 - 180 mg/dL   Lab Results  Component Value Date   GLUCAP 326 (H) 07/23/2022   HGBA1C 12.7 (H) 02/01/2022    Review of Glycemic Control  Diabetes history: DM2 Outpatient Diabetes medications: 70/30 9 units BID Current orders for Inpatient glycemic control: 70/30 9 units BID, Novolog 0-9 TID and 0-5 HS  HgbA1C - 12.7% CBG 306 mg/dL this am  Inpatient Diabetes Program Recommendations:    Increase 70/30 to 12 units BID  Increase Novolog to 0-15 TID with meals and 0-5 HS  F/U with PCP within a week of discharge for diabetes management  Follow trends.  Thank you. Ailene Ards, RD, LDN, CDCES Inpatient Diabetes Coordinator [phone]

## 2022-07-23 NOTE — Progress Notes (Signed)
[facility] MD Progress Note  07/23/2022 5:40 PM Sara [name]  MRN:  [id number]  Subjective:   The patient is a 47 year old African-American female with a long history of chronic medical issues and a history of substance use disorder who was admitted to [facility] from the ED on a voluntary status with symptoms of depression and suicidal ideations with a plan to take an overdose of insulin and starve herself to death.   On my examination today, the patient reports that her mood is less depressed.  She reports that her sleep is off and on, not optimal, and attributes this to staying in her room most of the day because she feels tired.  She reports daytime fatigue is due to taking Abilify the morning, and she usually takes it in the evening at home.  She would like to develop a switch from every morning to nightly.  Patient reports appetite is okay.  Reports that anxiety level still elevated pertaining to discharge planning.  She reports she cannot return to the [facility] house due to owing the money and that she was voted out.  She reports she is basically homeless unless she is able to go live with her uncle after discharge.  We discussed that she will get the number to her uncle, and will call him to discuss living there, and if he is agreeable we will do safety planning with the uncle.  Patient reports she has some constipation and had a hard stool this morning.  Discussed starting Dulcolax and she is agreeable with this.  We discussed beta-blocker due to high heart rate and patient is agreeable to this.  She has not run low blood sugars, we discussed the risk of beta-blocker and this blunting the symptoms of hypoglycemia.  She reports suicidal thoughts have ceased.  Denies any HI.  Denies any psychotic symptoms.     Principal Problem: MDD (major depressive disorder) Diagnosis: Principal Problem:   MDD (major depressive disorder)  Total Time spent with patient: 20 minutes  Past Psychiatric History: Past  psychiatric history significant for history of depression without psychosis.  She has been hospitalized multiple times over the last several years.  Records indicate hospitalizations at [facility], [facility], old [facility] and [facility] 12 years ago.  She was recently discharged from this facility about a month ago. She has history of multiple suicide attempts in the past.  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 pylori infection    High risk medication use    Hypercholesterolemia    Hyperlipidemia, mixed    Hypertension    Other mixed anxiety disorders    Pollen allergies    Sickle cell trait (HCC)    Sleep apnea    Vitamin D deficiency     Past Surgical History:  Procedure Laterality Date   ABDOMINAL HYSTERECTOMY     APPENDECTOMY     COLONOSCOPY  08-16-2008   Dr. Vallarie Mare   ESOPHAGOGASTRODUODENOSCOPY  08-16-2008   Dr. Rayfield Citizen    Family History:  Family History  Problem Relation Age of Onset   Clotting disorder Mother    Crohn's disease Mother    Heart disease Mother    Colon cancer Father  Clotting disorder Brother    Diabetes Brother    Diabetes Maternal Aunt    Liver cancer Maternal Uncle    Prostate cancer Maternal Uncle    Colon cancer Maternal Uncle    Diabetes Paternal Aunt    Esophageal cancer Paternal Uncle    Family Psychiatric  History: See H&P  Social History:  Social History   Substance and Sexual Activity  Alcohol Use Not Currently     Social History   Substance and Sexual Activity  Drug Use Not Currently   Types: Marijuana, Cocaine   Comment: 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    Social History   Socioeconomic History   Marital status: Divorced    Spouse name: Not on file   Number of  children: 2   Years of education: Not on file   Highest education level: Not on file  Occupational History   Occupation: n/a  Tobacco Use   Smoking status: Every Day    Packs/day: 1    Types: Cigarettes   Smokeless tobacco: Never   Tobacco comments:    Will order nicotine gum  Substance and Sexual Activity   Alcohol use: Not Currently   Drug use: Not Currently    Types: Marijuana, Cocaine    Comment: 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   Sexual activity: Not Currently  Other Topics Concern   Not on file  Social History Narrative   Not on file   Social Determinants of Health   Financial Resource Strain: Not on file  Food Insecurity: Food Insecurity Present (07/19/2022)   Hunger Vital Sign    Worried About Running Out of Food in the Last Year: Sometimes true    Ran Out of Food in the Last Year: Sometimes true  Transportation Needs: No Transportation Needs (07/19/2022)   PRAPARE - Administrator, Civil Service (Medical): No    Lack of Transportation (Non-Medical): No  Recent Concern: Transportation Needs - Unmet Transportation Needs (06/18/2022)   PRAPARE - Administrator, Civil Service (Medical): Yes    Lack of Transportation (Non-Medical): Yes  Physical Activity: Not on file  Stress: Not on file  Social Connections: Not on file   Additional Social History:                         Sleep: Poor  Appetite:  Fair  Current Medications: Current Facility-Administered Medications  Medication Dose Route Frequency Provider Last Rate Last Admin   alum & mag hydroxide-simeth (MAALOX/MYLANTA) 200-200-20 MG/5ML suspension 30 mL  30 mL Oral Q4H PRN Oneta Rack, NP       [START ON 07/24/2022] ARIPiprazole (ABILIFY) tablet 10 mg  10 mg Oral QHS Arieonna Medine, MD       diphenhydrAMINE (BENADRYL) capsule 50 mg  50 mg Oral TID PRN Oneta Rack, NP       Or   diphenhydrAMINE (BENADRYL) injection 50 mg  50 mg Intramuscular TID PRN  Oneta Rack, NP       FLUoxetine (PROZAC) capsule 20 mg  20 mg Oral Daily Oneta Rack, NP   20 mg at 07/23/22 6295   gabapentin (NEURONTIN) capsule 600 mg  600 mg Oral TID Rex Kras, MD   600 mg at 07/23/22 1722   haloperidol (HALDOL) tablet 5 mg  5 mg Oral TID PRN Oneta Rack, NP       Or   haloperidol  lactate (HALDOL) injection 5 mg  5 mg Intramuscular TID PRN Oneta Rack, NP       ibuprofen (ADVIL) tablet 600 mg  600 mg Oral Q6H PRN Bobbitt, Shalon E, NP   600 mg at 07/19/22 1451   [START ON 07/24/2022] insulin aspart (novoLOG) injection 0-15 Units  0-15 Units Subcutaneous TID WC Sharell Hilmer, Harrold Donath, MD       insulin aspart (novoLOG) injection 0-5 Units  0-5 Units Subcutaneous QHS Stephanie Littman, Harrold Donath, MD       Melene Muller ON 07/24/2022] insulin aspart protamine- aspart (NOVOLOG MIX 70/30) injection 12 Units  12 Units Subcutaneous BID WC Bettyjo Lundblad, Harrold Donath, MD       LORazepam (ATIVAN) tablet 2 mg  2 mg Oral TID PRN Oneta Rack, NP   2 mg at 07/19/22 1708   Or   LORazepam (ATIVAN) injection 2 mg  2 mg Intramuscular TID PRN Oneta Rack, NP       magnesium hydroxide (MILK OF MAGNESIA) suspension 30 mL  30 mL Oral Daily PRN Oneta Rack, NP       metoprolol succinate (TOPROL-XL) 24 hr tablet 12.5 mg  12.5 mg Oral Daily Dynasti Kerman, MD       nicotine polacrilex (NICORETTE) gum 2 mg  2 mg Oral PRN Rex Kras, MD   2 mg at 07/23/22 0757   oxyCODONE-acetaminophen (PERCOCET/ROXICET) 5-325 MG per tablet 1 tablet  1 tablet Oral QID Rex Kras, MD   1 tablet at 07/23/22 1722   psyllium (HYDROCIL/METAMUCIL) 1 packet  1 packet Oral Daily Rex Kras, MD   1 packet at 07/22/22 0721   traZODone (DESYREL) tablet 50 mg  50 mg Oral QHS PRN Oneta Rack, NP   50 mg at 07/22/22 2112    Lab Results:  Results for orders placed or performed during the hospital encounter of 07/18/22 (from the past 48 hour(s))  Glucose, capillary     Status: Abnormal   Collection  Time: 07/21/22  9:07 PM  Result Value Ref Range   Glucose-Capillary 319 (H) 70 - 99 mg/dL    Comment: Glucose reference range applies only to samples taken after fasting for at least 8 hours.  Glucose, capillary     Status: Abnormal   Collection Time: 07/22/22  6:15 AM  Result Value Ref Range   Glucose-Capillary 195 (H) 70 - 99 mg/dL    Comment: Glucose reference range applies only to samples taken after fasting for at least 8 hours.   Comment 1 Notify RN   Glucose, capillary     Status: Abnormal   Collection Time: 07/22/22 11:32 AM  Result Value Ref Range   Glucose-Capillary 209 (H) 70 - 99 mg/dL    Comment: Glucose reference range applies only to samples taken after fasting for at least 8 hours.  Glucose, capillary     Status: Abnormal   Collection Time: 07/22/22  4:40 PM  Result Value Ref Range   Glucose-Capillary 219 (H) 70 - 99 mg/dL    Comment: Glucose reference range applies only to samples taken after fasting for at least 8 hours.  Glucose, capillary     Status: Abnormal   Collection Time: 07/22/22  9:15 PM  Result Value Ref Range   Glucose-Capillary 306 (H) 70 - 99 mg/dL    Comment: Glucose reference range applies only to samples taken after fasting for at least 8 hours.  Glucose, capillary     Status: Abnormal   Collection Time: 07/23/22  6:42 AM  Result Value Ref Range   Glucose-Capillary 326 (H) 70 - 99 mg/dL    Comment: Glucose reference range applies only to samples taken after fasting for at least 8 hours.  Glucose, capillary     Status: Abnormal   Collection Time: 07/23/22 11:40 AM  Result Value Ref Range   Glucose-Capillary 222 (H) 70 - 99 mg/dL    Comment: Glucose reference range applies only to samples taken after fasting for at least 8 hours.  Glucose, capillary     Status: Abnormal   Collection Time: 07/23/22  4:53 PM  Result Value Ref Range   Glucose-Capillary 302 (H) 70 - 99 mg/dL    Comment: Glucose reference range applies only to samples taken after  fasting for at least 8 hours.   Comment 1 Notify RN    Comment 2 Document in Chart     Blood Alcohol level:  Lab Results  Component Value Date   ETH <10 06/17/2022   ETH  12/11/2009    <5        LOWEST DETECTABLE LIMIT FOR SERUM ALCOHOL IS 5 mg/dL FOR MEDICAL PURPOSES ONLY    Metabolic Disorder Labs: Lab Results  Component Value Date   HGBA1C 12.7 (H) 02/01/2022   MPG 318 02/01/2022   No results found for: "PROLACTIN" Lab Results  Component Value Date   CHOL 204 (H) 02/01/2022   TRIG 61 02/01/2022   HDL 61 02/01/2022   CHOLHDL 3.3 02/01/2022   VLDL 12 02/01/2022   LDLCALC 131 (H) 02/01/2022    Physical Findings: AIMS:  , ,  ,  ,    CIWA:    COWS:     Musculoskeletal: Strength & Muscle Tone: within normal limits Gait & Station: normal Patient leans: N/A  Psychiatric Specialty Exam:  Presentation  General Appearance:  Casual; Fairly Groomed  Eye Contact: Fair  Speech: Clear and Coherent  Speech Volume: Normal  Handedness: Right   Mood and Affect  Mood: Anxious; Depressed  Affect: Full Range   Thought Process  Thought Processes: Linear  Descriptions of Associations:Intact  Orientation:Full (Time, Place and Person)  Thought Content:Logical  History of Schizophrenia/Schizoaffective disorder:No data recorded Duration of Psychotic Symptoms:No data recorded Hallucinations:Hallucinations: None  Ideas of Reference:None  Suicidal Thoughts:Suicidal Thoughts: No  Homicidal Thoughts:Homicidal Thoughts: No   Sensorium  Memory: Immediate Fair; Recent Fair; Remote Fair  Judgment: Fair  Insight: Fair   Art therapist  Concentration: Fair  Attention Span: Fair  Recall: [employer] of Knowledge: Fair  Language: Fair   Psychomotor Activity  Psychomotor Activity: Psychomotor Activity: Normal   Assets  Assets: Communication Skills; Desire for Improvement   Sleep  Sleep: Sleep: Fair    Physical  Exam: Physical Exam Vitals reviewed.  Constitutional:      Appearance: She is normal weight.  Pulmonary:     Effort: Pulmonary effort is normal.  Neurological:     Mental Status: She is alert.     Motor: No weakness.     Gait: Gait normal.  Psychiatric:        Behavior: Behavior normal.        Judgment: Judgment normal.    Review of Systems  Constitutional:  Negative for chills and fever.  Cardiovascular:  Negative for chest pain and palpitations.  Neurological:  Negative for dizziness, tingling, tremors and headaches.  Psychiatric/Behavioral:  Positive for depression and substance abuse. Negative for hallucinations, memory loss and suicidal ideas. The patient is nervous/anxious and has insomnia.   All  other systems reviewed and are negative.  Blood pressure 107/81, pulse (!) 118, temperature 98.6 F (37 C), temperature source Oral, resp. rate 20, height 5\' 4"  (1.626 m), weight 66.2 kg, SpO2 100 %. Body mass index is 25.06 kg/m.   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 Psychiatric Diagnoses and Treatment:    -Change Abilify from 10 mg every morning to 10 mg nightly, due to her reported daytime fatigue.  For MDD -Continue Prozac 20 mg once daily for MDD -Continue his gabapentin 600 mg 3 times daily for diabetic neuropathy -Start metoprolol ER for 12.5 mg  once daily for tach cardia    --  The risks/benefits/side-effects/alternatives to this medication were discussed in detail with the patient and time was given for questions. The patient consents to medication trial.    -- Metabolic profile and EKG monitoring obtained while on an atypical antipsychotic (BMI: Lipid Panel: HbgA1c: QTc:)   -- Encouraged patient to participate in unit milieu and in scheduled group therapies       3. Medical Issues Being Addressed:   -Adjust insulin regimen as per recommendation of the diabetes management coordinator -Continue Percocet as needed  4. Discharge Planning:   -- Social work and case management to assist with discharge planning and identification of hospital follow-up needs prior to discharge  -- Estimated LOS: 2-3 more days  -- Discharge Concerns: Need to establish a safety plan; Medication compliance and effectiveness  -- Discharge Goals: Return home with outpatient referrals for mental health follow-up including medication management/psychotherapy   Cristy Hilts, MD 07/23/2022, 5:40 PM  Total Time Spent in Direct Patient Care:  I personally spent 35 minutes on the unit in direct patient care. The direct patient care time included face-to-face time with the patient, reviewing the patient's chart, communicating with other professionals, and coordinating care. Greater than 50% of this time was spent in counseling or coordinating care with the patient regarding goals of hospitalization, psycho-education, and discharge planning needs.   Phineas Inches, MD Psychiatrist

## 2022-07-24 DIAGNOSIS — F332 Major depressive disorder, recurrent severe without psychotic features: Secondary | ICD-10-CM | POA: Diagnosis not present

## 2022-07-24 LAB — GLUCOSE, CAPILLARY
Glucose-Capillary: 254 mg/dL — ABNORMAL HIGH (ref 70–99)
Glucose-Capillary: 207 mg/dL — ABNORMAL HIGH (ref 70–99)
Glucose-Capillary: 261 mg/dL — ABNORMAL HIGH (ref 70–99)
Glucose-Capillary: 196 mg/dL — ABNORMAL HIGH (ref 70–99)

## 2022-07-24 NOTE — Group Note (Signed)
Date:  07/24/2022 Time:  12:12 PM  Group Topic/Focus:  Recovery Goals:   The focus of this group is to identify appropriate goals for recovery and establish a plan to achieve them. Stages of Change:   The focus of this group is to explain the stages of change and help patients identify changes they want to make upon discharge.    Participation Level:  Active  Participation Quality:  Appropriate  Affect:  Appropriate  Cognitive:  Appropriate  Insight: Appropriate  Engagement in Group:  Engaged  Modes of Intervention:  Discussion, Education, and Exploration  Additional Comments:    Memory Dance Sara [name] 07/24/2022, 12:12 PM

## 2022-07-24 NOTE — Progress Notes (Signed)
[facility] MD Progress Note  07/24/2022 7:02 PM Sara [name]  MRN:  [id number]  Subjective:   The patient is a 47 year old African-American female with a long history of chronic medical issues and a history of substance use disorder who was admitted to [facility] from the ED on a voluntary status with symptoms of depression and suicidal ideations with a plan to take an overdose of insulin and starve herself to death.    On assessment today, the pt reports that their mood is much better, euthymic, improved since admission, and stable. Denies feeling down, depressed, or sad.  Reports that anxiety symptoms are at manageable level.  Sleep is better. Appetite is stable.  Concentration is without complaint.  Energy level is adequate. Denies having any suicidal thoughts. Denies having any suicidal intent and plan.  Denies having any HI.  Denies having psychotic symptoms.   Denies having side effects to current psychiatric medications.   Discussed discharge planning for tomorrow, to care of uncle, social work was able to do safety planning with the uncle.    Principal Problem: MDD (major depressive disorder) Diagnosis: Principal Problem:   MDD (major depressive disorder)  Total Time spent with patient: 20 minutes  Past Psychiatric History: Past psychiatric history significant for history of depression without psychosis.  She has been hospitalized multiple times over the last several years.  Records indicate hospitalizations at [facility], [facility], old [facility] and [facility] 12 years ago.  She was recently discharged from this facility about a month ago. She has history of multiple suicide attempts in the past.  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 pylori  infection    High risk medication use    Hypercholesterolemia    Hyperlipidemia, mixed    Hypertension    Other mixed anxiety disorders    Pollen allergies    Sickle cell trait (HCC)    Sleep apnea    Vitamin D deficiency     Past Surgical History:  Procedure Laterality Date   ABDOMINAL HYSTERECTOMY     APPENDECTOMY     COLONOSCOPY  08-16-2008   Dr. Vallarie Mare   ESOPHAGOGASTRODUODENOSCOPY  08-16-2008   Dr. Rayfield Citizen    Family History:  Family History  Problem Relation Age of Onset   Clotting disorder Mother    Crohn's disease Mother    Heart disease Mother    Colon cancer Father    Clotting disorder Brother    Diabetes Brother    Diabetes Maternal Aunt    Liver cancer Maternal Uncle    Prostate cancer Maternal Uncle    Colon cancer Maternal Uncle    Diabetes Paternal Aunt    Esophageal cancer Paternal Uncle    Family Psychiatric  History: See H&P  Social History:  Social History   Substance and Sexual Activity  Alcohol Use Not Currently     Social History   Substance and Sexual Activity  Drug Use Not Currently   Types: Marijuana, Cocaine   Comment: 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    Social History   Socioeconomic History   Marital status: Divorced    Spouse name: Not on file   Number of children: 2   Years  of education: Not on file   Highest education level: Not on file  Occupational History   Occupation: n/a  Tobacco Use   Smoking status: Every Day    Packs/day: 1    Types: Cigarettes   Smokeless tobacco: Never   Tobacco comments:    Will order nicotine gum  Substance and Sexual Activity   Alcohol use: Not Currently   Drug use: Not Currently    Types: Marijuana, Cocaine    Comment: 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   Sexual activity: Not Currently  Other Topics Concern   Not on file  Social History Narrative   Not on file   Social Determinants of Health   Financial Resource Strain: Not on file  Food  Insecurity: Food Insecurity Present (07/19/2022)   Hunger Vital Sign    Worried About Running Out of Food in the Last Year: Sometimes true    Ran Out of Food in the Last Year: Sometimes true  Transportation Needs: No Transportation Needs (07/19/2022)   PRAPARE - Administrator, Civil Service (Medical): No    Lack of Transportation (Non-Medical): No  Recent Concern: Transportation Needs - Unmet Transportation Needs (06/18/2022)   PRAPARE - Administrator, Civil Service (Medical): Yes    Lack of Transportation (Non-Medical): Yes  Physical Activity: Not on file  Stress: Not on file  Social Connections: Not on file   Additional Social History:                         Sleep: Better  Appetite:  Fair  Current Medications: Current Facility-Administered Medications  Medication Dose Route Frequency Provider Last Rate Last Admin   alum & mag hydroxide-simeth (MAALOX/MYLANTA) 200-200-20 MG/5ML suspension 30 mL  30 mL Oral Q4H PRN Oneta Rack, NP       ARIPiprazole (ABILIFY) tablet 10 mg  10 mg Oral QHS Hawraa Stambaugh, MD       diphenhydrAMINE (BENADRYL) capsule 50 mg  50 mg Oral TID PRN Oneta Rack, NP       Or   diphenhydrAMINE (BENADRYL) injection 50 mg  50 mg Intramuscular TID PRN Oneta Rack, NP       FLUoxetine (PROZAC) capsule 20 mg  20 mg Oral Daily Oneta Rack, NP   20 mg at 07/24/22 0800   gabapentin (NEURONTIN) capsule 600 mg  600 mg Oral TID Rex Kras, MD   600 mg at 07/24/22 1719   haloperidol (HALDOL) tablet 5 mg  5 mg Oral TID PRN Oneta Rack, NP       Or   haloperidol lactate (HALDOL) injection 5 mg  5 mg Intramuscular TID PRN Oneta Rack, NP       ibuprofen (ADVIL) tablet 600 mg  600 mg Oral Q6H PRN Bobbitt, Shalon E, NP   600 mg at 07/19/22 1451   insulin aspart (novoLOG) injection 0-15 Units  0-15 Units Subcutaneous TID WC Sabah Zucco, Harrold Donath, MD   8 Units at 07/24/22 1713   insulin aspart (novoLOG) injection  0-5 Units  0-5 Units Subcutaneous QHS Michae Grimley, Harrold Donath, MD   3 Units at 07/23/22 2117   insulin aspart protamine- aspart (NOVOLOG MIX 70/30) injection 12 Units  12 Units Subcutaneous BID WC Destina Mantei, Harrold Donath, MD   12 Units at 07/24/22 1714   LORazepam (ATIVAN) tablet 2 mg  2 mg Oral TID PRN Oneta Rack, NP   2 mg at 07/19/22  1708   Or   LORazepam (ATIVAN) injection 2 mg  2 mg Intramuscular TID PRN Oneta Rack, NP       magnesium hydroxide (MILK OF MAGNESIA) suspension 30 mL  30 mL Oral Daily PRN Oneta Rack, NP       metoprolol succinate (TOPROL-XL) 24 hr tablet 12.5 mg  12.5 mg Oral Daily Jenice Leiner, MD   12.5 mg at 07/24/22 0800   nicotine polacrilex (NICORETTE) gum 2 mg  2 mg Oral PRN Rex Kras, MD   2 mg at 07/23/22 1807   oxyCODONE-acetaminophen (PERCOCET/ROXICET) 5-325 MG per tablet 1 tablet  1 tablet Oral QID Rex Kras, MD   1 tablet at 07/24/22 1719   psyllium (HYDROCIL/METAMUCIL) 1 packet  1 packet Oral Daily Rex Kras, MD   1 packet at 07/22/22 1610   traZODone (DESYREL) tablet 50 mg  50 mg Oral QHS PRN Oneta Rack, NP   50 mg at 07/23/22 2116    Lab Results:  Results for orders placed or performed during the hospital encounter of 07/18/22 (from the past 48 hour(s))  Glucose, capillary     Status: Abnormal   Collection Time: 07/22/22  9:15 PM  Result Value Ref Range   Glucose-Capillary 306 (H) 70 - 99 mg/dL    Comment: Glucose reference range applies only to samples taken after fasting for at least 8 hours.  Glucose, capillary     Status: Abnormal   Collection Time: 07/23/22  6:42 AM  Result Value Ref Range   Glucose-Capillary 326 (H) 70 - 99 mg/dL    Comment: Glucose reference range applies only to samples taken after fasting for at least 8 hours.  Glucose, capillary     Status: Abnormal   Collection Time: 07/23/22 11:40 AM  Result Value Ref Range   Glucose-Capillary 222 (H) 70 - 99 mg/dL    Comment: Glucose reference range  applies only to samples taken after fasting for at least 8 hours.  Glucose, capillary     Status: Abnormal   Collection Time: 07/23/22  4:53 PM  Result Value Ref Range   Glucose-Capillary 302 (H) 70 - 99 mg/dL    Comment: Glucose reference range applies only to samples taken after fasting for at least 8 hours.   Comment 1 Notify RN    Comment 2 Document in Chart   Glucose, capillary     Status: Abnormal   Collection Time: 07/23/22  8:50 PM  Result Value Ref Range   Glucose-Capillary 259 (H) 70 - 99 mg/dL    Comment: Glucose reference range applies only to samples taken after fasting for at least 8 hours.  Glucose, capillary     Status: Abnormal   Collection Time: 07/24/22  5:59 AM  Result Value Ref Range   Glucose-Capillary 254 (H) 70 - 99 mg/dL    Comment: Glucose reference range applies only to samples taken after fasting for at least 8 hours.   Comment 1 Notify RN    Comment 2 Document in Chart   Glucose, capillary     Status: Abnormal   Collection Time: 07/24/22 12:10 PM  Result Value Ref Range   Glucose-Capillary 207 (H) 70 - 99 mg/dL    Comment: Glucose reference range applies only to samples taken after fasting for at least 8 hours.  Glucose, capillary     Status: Abnormal   Collection Time: 07/24/22  5:09 PM  Result Value Ref Range   Glucose-Capillary 261 (H) 70 - 99 mg/dL  Comment: Glucose reference range applies only to samples taken after fasting for at least 8 hours.    Blood Alcohol level:  Lab Results  Component Value Date   ETH <10 06/17/2022   [facility]  12/11/2009    <5        LOWEST DETECTABLE LIMIT FOR SERUM ALCOHOL IS 5 mg/dL FOR MEDICAL PURPOSES ONLY    Metabolic Disorder Labs: Lab Results  Component Value Date   HGBA1C 12.7 (H) 02/01/2022   MPG 318 02/01/2022   No results found for: "PROLACTIN" Lab Results  Component Value Date   CHOL 204 (H) 02/01/2022   TRIG 61 02/01/2022   HDL 61 02/01/2022   CHOLHDL 3.3 02/01/2022   VLDL 12 02/01/2022    LDLCALC 131 (H) 02/01/2022    Physical Findings: AIMS:  , ,  ,  ,    CIWA:    COWS:     Musculoskeletal: Strength & Muscle Tone: within normal limits Gait & Station: normal Patient leans: N/A  Psychiatric Specialty Exam:  Presentation  General Appearance:  Casual; Fairly Groomed  Eye Contact: Fair  Speech: Clear and Coherent  Speech Volume: Normal  Handedness: Right   Mood and Affect  Mood: Improved, euthymic  Affect: Full Range   Thought Process  Thought Processes: Linear  Descriptions of Associations:Intact  Orientation:Full (Time, Place and Person)  Thought Content:Logical  History of Schizophrenia/Schizoaffective disorder:No data recorded Duration of Psychotic Symptoms:No data recorded N/A Hallucinations:Hallucinations: None  Ideas of Reference:None  Suicidal Thoughts:Suicidal Thoughts: No  Homicidal Thoughts:No data recorded Denies  Sensorium  Memory: Immediate Fair; Recent Fair; Remote Fair  Judgment: Fair  Insight: Fair   Art therapist  Concentration: Fair  Attention Span: Fair  Recall: [employer] of Knowledge: Fair  Language: Fair   Psychomotor Activity  Psychomotor Activity: Psychomotor Activity: Normal   Assets  Assets: Communication Skills; Desire for Improvement   Sleep  Sleep: Sleep: Fair    Physical Exam: Physical Exam Vitals reviewed.  Constitutional:      Appearance: She is normal weight.  Pulmonary:     Effort: Pulmonary effort is normal.  Neurological:     Mental Status: She is alert.     Motor: No weakness.     Gait: Gait normal.  Psychiatric:        Behavior: Behavior normal.        Judgment: Judgment normal.    Review of Systems  Constitutional:  Negative for chills and fever.  Cardiovascular:  Negative for chest pain and palpitations.  Neurological:  Negative for dizziness, tingling, tremors and headaches.  Psychiatric/Behavioral:  Positive for depression and  substance abuse. Negative for hallucinations, memory loss and suicidal ideas. The patient is nervous/anxious and has insomnia.   All other systems reviewed and are negative.  Blood pressure 119/85, pulse 99, temperature 98.6 F (37 C), temperature source Oral, resp. rate 16, height 5\' 4"  (1.626 m), weight 66.2 kg, SpO2 100 %. Body mass index is 25.06 kg/m.   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 Psychiatric Diagnoses and Treatment:    -Continue Abilify 10 mg nightly - for MDD -Continue Prozac 20 mg once daily for MDD -Continue his gabapentin 600 mg 3 times daily for diabetic neuropathy -Continue to metoprolol ER for 12.5 mg once daily for tachycardia    --  The risks/benefits/side-effects/alternatives to this medication were discussed in detail with the patient and time was given for questions. The patient consents to medication trial.    -- Metabolic profile and EKG monitoring obtained while on an atypical antipsychotic (BMI: Lipid Panel: HbgA1c: QTc:)   -- Encouraged patient to participate in unit milieu and in scheduled group therapies       3. Medical Issues Being Addressed:   -Adjust insulin regimen as per recommendation of the diabetes management coordinator -Continue Percocet as needed  4. Discharge Planning:   -- Social work and case management to  assist with discharge planning and identification of hospital follow-up needs prior to discharge  -- Estimated LOS: 1 more day  -- Discharge Concerns: Need to establish a safety plan; Medication compliance and effectiveness  -- Discharge Goals: Return home with outpatient referrals for mental health follow-up including medication management/psychotherapy   Cristy Hilts, MD 07/24/2022, 7:02 PM  Total Time Spent in Direct Patient Care:  I personally spent 25 minutes on the unit in direct patient care. The direct patient care time included face-to-face time with the patient, reviewing the patient's chart, communicating with other professionals, and coordinating care. Greater than 50% of this time was spent in counseling or coordinating care with the patient regarding goals of hospitalization, psycho-education, and discharge planning needs.   Phineas Inches, MD Psychiatrist

## 2022-07-24 NOTE — Progress Notes (Addendum)
Patient denies SI, HI, AVH, depression and anxiety. Patient is calm, cooperative and interactive in the milieu. No adverse drug reactions are noted and patient remains safe on the unit.   07/24/22 1000  Psych Admission Type (Psych Patients Only)  Admission Status Voluntary  Psychosocial Assessment  Patient Complaints None  Eye Contact Fair  Facial Expression Animated  Affect Appropriate to circumstance  Speech Logical/coherent  Interaction Assertive  Motor Activity Other (Comment) (WDL)  Appearance/Hygiene Unremarkable  Behavior Characteristics Cooperative;Appropriate to situation  Mood Pleasant  Thought Process  Coherency WDL  Content WDL  Delusions None reported or observed  Perception WDL  Hallucination None reported or observed  Judgment Poor  Confusion None  Danger to Self  Current suicidal ideation? Denies  Agreement Not to Harm Self Yes  Description of Agreement verbal  Danger to Others  Danger to Others None reported or observed

## 2022-07-24 NOTE — [facility] Suicide Risk Assessment (Signed)
[facility] INPATIENT:  Family/Significant Other Suicide Prevention Education  Suicide Prevention Education:  Education Completed; Sara [name], [phone]  (name of family member/significant other) has been identified by the patient as the family member/significant other with whom the patient will be residing, and identified as the person(s) who will aid the patient in the event of a mental health crisis (suicidal ideations/suicide attempt).  With written consent from the patient, the family member/significant other has been provided the following suicide prevention education, prior to the and/or following the discharge of the patient.  Uncle confirms that patient can stay with him in [place], [place]. He confirms no guns/weapons.  No additional safety concerns.   The suicide prevention education provided includes the following: Suicide risk factors Suicide prevention and interventions National Suicide Hotline telephone number [facility] assessment telephone number [facility] Emergency Assistance 911 [facility] and/or Residential Mobile Crisis Unit telephone number  Request made of family/significant other to: Remove weapons (Sara.g., guns, rifles, knives), all items previously/currently identified as safety concern.   Remove drugs/medications (over-the-counter, prescriptions, illicit drugs), all items previously/currently identified as a safety concern.  The family member/significant other verbalizes understanding of the suicide prevention education information provided.  The family member/significant other agrees to remove the items of safety concern listed above.  Sara [name] Sara [name] 07/24/2022, 2:27 PM

## 2022-07-24 NOTE — [facility] Group Notes (Signed)
The focus of this group is to help patients establish daily goals to achieve during treatment and discuss how the patient can incorporate goal setting into their daily lives to aide in recovery.       Scale 1-10  10 out of 10     Goal: Rest and relax

## 2022-07-24 NOTE — [facility] Group Notes (Signed)
Adult Psychoeducational Group Note  Date:  07/24/2022 Time:  9:01 PM  Group Topic/Focus:  Wrap-Up Group:   The focus of this group is to help patients review their daily goal of treatment and discuss progress on daily workbooks.  Participation Level:  Active  Participation Quality:  Appropriate  Affect:  Appropriate  Cognitive:  Appropriate  Insight: Appropriate  Engagement in Group:  Engaged  Modes of Intervention:  Discussion  Additional Comments:  Patient attended and participated in the Wrap-up group.  Jearl Klinefelter 07/24/2022, 9:01 PM

## 2022-07-24 NOTE — Progress Notes (Signed)
   07/24/22 0530  15 Minute Checks  Location Bedroom  Visual Appearance Calm  Behavior Sleeping  Sleep (Behavioral Health Patients Only)  Calculate sleep? (Click Yes once per 24 hr at 0600 safety check) Yes  Documented sleep last 24 hours 8.25    

## 2022-07-25 DIAGNOSIS — F332 Major depressive disorder, recurrent severe without psychotic features: Secondary | ICD-10-CM | POA: Diagnosis not present

## 2022-07-25 LAB — GLUCOSE, CAPILLARY: Glucose-Capillary: 243 mg/dL — ABNORMAL HIGH (ref 70–99)

## 2022-07-25 MED ORDER — GABAPENTIN 300 MG PO CAPS: 600.0000 mg | ORAL_CAPSULE | Freq: Three times a day (TID) | ORAL | 0 refills | Status: DC

## 2022-07-25 MED ORDER — METOPROLOL SUCCINATE ER 25 MG PO TB24: 12.5000 mg | ORAL_TABLET | Freq: Every day | ORAL | 0 refills | Status: DC

## 2022-07-25 MED ORDER — PSYLLIUM 95 % PO PACK: 1.0000 | PACK | Freq: Every day | ORAL | Status: DC

## 2022-07-25 MED ORDER — NICOTINE POLACRILEX 2 MG MT GUM: 2.0000 mg | CHEWING_GUM | OROMUCOSAL | 0 refills | Status: DC | PRN

## 2022-07-25 MED ORDER — ARIPIPRAZOLE 10 MG PO TABS: 10.0000 mg | ORAL_TABLET | Freq: Every day | ORAL | 0 refills | Status: DC

## 2022-07-25 MED ORDER — TRAZODONE HCL 50 MG PO TABS: 50.0000 mg | ORAL_TABLET | Freq: Every evening | ORAL | 0 refills | Status: DC | PRN

## 2022-07-25 MED ORDER — FLUOXETINE HCL 20 MG PO CAPS: 20.0000 mg | ORAL_CAPSULE | Freq: Every day | ORAL | 0 refills | Status: DC

## 2022-07-25 NOTE — Discharge Summary (Signed)
Physician Discharge Summary Note  Patient:  Sara [name] is an 47 y.o., female MRN:  [id number] DOB:  05/25/1975 Patient phone:  [phone] (home)  Patient address:   [address] [place] [address],  Total Time spent with patient: 20 minutes  Date of Admission:  07/18/2022 Date of Discharge: 07-25-2022  Reason for Admission:   The patient is a 47 year old African-American female with a long history of chronic medical issues and a history of substance use disorder who was admitted to [facility] from the ED on a voluntary status with symptoms of depression and suicidal ideations with a plan to take an overdose of insulin and starve herself to death.    Principal Problem: MDD (major depressive disorder) Discharge Diagnoses: Principal Problem:   MDD (major depressive disorder)   Past Psychiatric History:  Past psychiatric history significant for history of depression without psychosis.  She has been hospitalized multiple times over the last several years.  Records indicate hospitalizations at [facility] behavioral health, [facility], old [facility] and [facility] 12 years ago.  She was recently discharged from this facility about a month ago. She has history of multiple suicide attempts in the past.  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 pylori infection    High risk medication use    Hypercholesterolemia    Hyperlipidemia, mixed    Hypertension    Other mixed anxiety disorders    Pollen allergies    Sickle cell trait (HCC)    Sleep apnea    Vitamin D deficiency     Past Surgical History:  Procedure Laterality Date   ABDOMINAL HYSTERECTOMY     APPENDECTOMY     COLONOSCOPY  08-16-2008   Dr. Vallarie Mare   ESOPHAGOGASTRODUODENOSCOPY  08-16-2008   Dr.  Rayfield Citizen    Family History:  Family History  Problem Relation Age of Onset   Clotting disorder Mother    Crohn's disease Mother    Heart disease Mother    Colon cancer Father    Clotting disorder Brother    Diabetes Brother    Diabetes Maternal Aunt    Liver cancer Maternal Uncle    Prostate cancer Maternal Uncle    Colon cancer Maternal Uncle    Diabetes Paternal Aunt    Esophageal cancer Paternal Uncle    Family Psychiatric  History:  History of cancer, diabetes and history of manic depression in mother with attempted suicide. Cousin with history of suicide.   Social History:  Social History   Substance and Sexual Activity  Alcohol Use Not Currently     Social History   Substance and Sexual Activity  Drug Use Not Currently   Types: Marijuana, Cocaine   Comment: 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    Social History   Socioeconomic History   Marital status: Divorced    Spouse name: Not on file   Number of children: 2   Years of education: Not on file   Highest education level: Not on file  Occupational History   Occupation: n/a  Tobacco Use   Smoking status: Every Day    Packs/day: 1    Types: Cigarettes   Smokeless tobacco: Never   Tobacco comments:  Will order nicotine gum  Substance and Sexual Activity   Alcohol use: Not Currently   Drug use: Not Currently    Types: Marijuana, Cocaine    Comment: 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   Sexual activity: Not Currently  Other Topics Concern   Not on file  Social History Narrative   Not on file   Social Determinants of Health   Financial Resource Strain: Not on file  Food Insecurity: Food Insecurity Present (07/19/2022)   Hunger Vital Sign    Worried About Running Out of Food in the Last Year: Sometimes true    Ran Out of Food in the Last Year: Sometimes true  Transportation Needs: No Transportation Needs (07/19/2022)   PRAPARE - Administrator, Civil Service  (Medical): No    Lack of Transportation (Non-Medical): No  Recent Concern: Transportation Needs - Unmet Transportation Needs (06/18/2022)   PRAPARE - Administrator, Civil Service (Medical): Yes    Lack of Transportation (Non-Medical): Yes  Physical Activity: Not on file  Stress: Not on file  Social Connections: Not on file    Hospital Course:   During the patient's hospitalization, patient had extensive initial psychiatric evaluation, and follow-up psychiatric evaluations every day.  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 She does not want insulin at dc, has an appt with PCP on 08-04-22, and will discuss DM treatment at that appt.   Patient's care was discussed during the interdisciplinary team meeting every day during the hospitalization.  The patient denied having side effects to prescribed psychiatric medication.  Gradually, patient started adjusting to milieu. The patient was evaluated each day by a clinical provider to ascertain response to treatment. Improvement was noted by the patient's report of decreasing symptoms, improved sleep and appetite, affect, medication tolerance, behavior, and participation in unit programming.  Patient was asked each day to complete a self inventory noting mood, mental status, pain, new symptoms, anxiety and concerns.    Symptoms were reported as significantly decreased or resolved completely by discharge.    On day of discharge, the patient reports that their mood is stable. The patient denied having suicidal thoughts for more than 48 hours prior to discharge.  Patient denies having homicidal thoughts.  Patient denies having auditory hallucinations.  Patient denies any visual hallucinations or other symptoms of psychosis. The patient was motivated to continue taking medication with a goal of continued improvement in mental health.   The patient reports their target psychiatric symptoms of depression and suicidal thoughts, all responded well to the psychiatric medications, and the patient reports overall benefit other psychiatric hospitalization. Supportive psychotherapy was provided to the patient. The patient also participated in regular group therapy while hospitalized. Coping skills, problem solving as well as relaxation therapies were also part of the unit programming.  Labs were reviewed with the patient, and abnormal results were discussed with the patient.  The patient is able to verbalize their individual safety plan  to this provider.  # It is recommended to the patient to continue psychiatric medications as prescribed, after discharge from the hospital.    # It is recommended to the patient to follow up with your outpatient psychiatric provider and PCP.  # It was discussed with the patient, the impact of alcohol, drugs, tobacco have been there overall psychiatric and medical wellbeing, and total abstinence from substance use was recommended the patient.ed.  # Prescriptions provided or sent directly to preferred pharmacy at discharge. Patient agreeable to plan. Given opportunity to ask questions. Appears to feel comfortable with discharge.    # In the event of worsening symptoms, the patient is instructed to call the crisis hotline, 911 and or go to the nearest ED for appropriate evaluation and treatment of symptoms. To follow-up with primary care provider for other medical issues, concerns  and or health care needs  # Patient was discharged to care of Uncle, with a plan to follow up as noted below.   Physical Findings: AIMS:  , ,  ,  ,    CIWA:    COWS:     Aims score zero on my exam. No eps on my exam.   Musculoskeletal: Strength & Muscle Tone: within normal limits Gait & Station: normal Patient leans: N/A   Psychiatric Specialty Exam:  Presentation  General Appearance:  Appropriate for Environment; Casual; Fairly Groomed  Eye Contact: Good  Speech: Normal Rate; Clear and Coherent  Speech Volume: Normal  Handedness: Right   Mood and Affect  Mood: Euthymic  Affect: Appropriate; Congruent; Full Range   Thought Process  Thought Processes: Linear  Descriptions of Associations:Intact  Orientation:Full (Time, Place and Person)  Thought Content:Logical  History of Schizophrenia/Schizoaffective disorder:No data recorded Duration of Psychotic Symptoms:No data recorded Hallucinations:Hallucinations: None  Ideas of Reference:None  Suicidal Thoughts:Suicidal Thoughts: No  Homicidal Thoughts:Homicidal Thoughts: No   Sensorium  Memory: Immediate Good; Recent Good; Remote Good  Judgment: Fair  Insight: Fair   Art therapist  Concentration: Fair  Attention Span: Fair  Recall: Good  Fund of Knowledge: Good  Language: Good   Psychomotor Activity  Psychomotor Activity: Psychomotor Activity: Normal   Assets  Assets: Communication Skills; Desire for Improvement   Sleep  Sleep: Sleep: Fair    Physical Exam: Physical Exam Vitals reviewed.  Constitutional:      Appearance: She is normal weight.  Pulmonary:     Effort: Pulmonary effort is normal.  Neurological:     Mental Status: She is alert.     Motor: No weakness.     Gait: Gait normal.  Psychiatric:        Mood and Affect: Mood normal.        Behavior: Behavior normal.        Thought Content: Thought content normal.        Judgment: Judgment  normal.    Review of Systems  Constitutional:  Negative for chills and fever.  Cardiovascular:  Negative for chest pain and palpitations.  Neurological:  Negative for dizziness, tingling, tremors and headaches.  Psychiatric/Behavioral:  Negative for depression, hallucinations, memory loss, substance abuse and suicidal ideas. The patient is not nervous/anxious and does not have insomnia.   All other systems reviewed and are negative.  Blood pressure 100/70, pulse (!) 105, temperature 98 F (36.7 C), temperature source Oral, resp. rate 18, height 5\' 4"  (1.626 m), weight 66.2 kg, SpO2 100 %. Body mass index is 25.06 kg/m.   Social History   Tobacco Use  Smoking Status Every Day   Packs/day: 1   Types: Cigarettes  Smokeless Tobacco Never  Tobacco Comments   Will order nicotine gum   Tobacco Cessation:  A prescription for an FDA-approved tobacco cessation medication provided at discharge   Blood Alcohol level:  Lab Results  Component Value Date   ETH <10 06/17/2022   [facility]  12/11/2009    <5        LOWEST DETECTABLE LIMIT FOR SERUM ALCOHOL IS 5 mg/dL FOR MEDICAL PURPOSES ONLY    Metabolic Disorder Labs:  Lab Results  Component Value Date   HGBA1C 12.7 (H) 02/01/2022   MPG 318 02/01/2022   No results found for: "PROLACTIN" Lab Results  Component Value Date   CHOL 204 (H) 02/01/2022   TRIG 61 02/01/2022   HDL 61 02/01/2022   CHOLHDL 3.3 02/01/2022   VLDL 12 02/01/2022   LDLCALC 131 (H) 02/01/2022    See Psychiatric Specialty Exam and Suicide Risk Assessment completed by Attending Physician prior to discharge.  Discharge destination:  Other:  to care of uncle  Is patient on multiple antipsychotic therapies at discharge:  No   Has Patient had three or more failed trials of antipsychotic monotherapy by history:  No  Recommended Plan for Multiple Antipsychotic Therapies: NA  Discharge Instructions     Diet - low sodium heart healthy   Complete by: As directed     Increase activity slowly   Complete by: As directed       Allergies as of 07/25/2022       Reactions   Clarithromycin Itching, Rash   Doxycycline Anaphylaxis, Rash   Penicillins Anaphylaxis, Rash   Tetracyclines & Related Anaphylaxis   Metronidazole Itching, Rash   Red Dye Itching   Likely red dye allergy - rx with Tylenol elixir, can tolerate tablets        Medication List     STOP taking these medications    diphenhydrAMINE 25 mg capsule Commonly known as: BENADRYL   docusate sodium 100 MG capsule Commonly known as: COLACE   omeprazole 40 MG capsule Commonly known as: PRILOSEC   propranolol 10 MG tablet Commonly known as: INDERAL       TAKE these medications      Indication  albuterol 108 (90 Base) MCG/ACT inhaler Commonly known as: VENTOLIN HFA Inhale 1-2 puffs into the lungs every 6 (six) hours as needed for wheezing or shortness of breath.  Indication: Asthma   ARIPiprazole 10 MG tablet Commonly known as: ABILIFY Take 1 tablet (10 mg total) by mouth at bedtime.  Indication: Major Depressive Disorder, Mood stabilization.   FLUoxetine 20 MG capsule Commonly known as: PROZAC Take 1 capsule (20 mg total) by mouth daily.  Indication: Major Depressive Disorder   gabapentin 300 MG capsule Commonly known as: NEURONTIN Take 2 capsules (600 mg total) by mouth 3 (three) times daily.  Indication: Diabetes with Nerve Disease   metoprolol succinate 25 MG 24 hr tablet Commonly known as: TOPROL-XL Take 0.5 tablets (12.5 mg total) by mouth daily. Start taking on: July 26, 2022  Indication: High Blood Pressure Disorder, tachycardia   nicotine polacrilex 2 MG gum Commonly known as: NICORETTE Take 1 each (2 mg total) by mouth as needed for smoking cessation.  Indication: Nicotine Addiction   oxyCODONE-acetaminophen 10-325 MG tablet Commonly known as: PERCOCET Take 1 tablet by mouth 3 (three) times daily.  Indication: Pain   psyllium 95 % Pack Commonly  known as: HYDROCIL/METAMUCIL Take 1 packet by  mouth daily. Start taking on: July 26, 2022  Indication: Constipation, Type 2 Diabetes   traZODone 50 MG tablet Commonly known as: DESYREL Take 1 tablet (50 mg total) by mouth at bedtime as needed for sleep. What changed:  medication strength how much to take when to take this reasons to take this  Indication: Trouble Sleeping, Major Depressive Disorder        Follow-up Information     [facility] [employer]. Go to.   Why: Please return to this provider for therapy and medication management services. Contact information: [address] [place] [address] [phone]                 Follow-up recommendations:    Activity: as tolerated  Diet: heart healthy  Other: -Follow-up with your outpatient psychiatric provider -instructions on appointment date, time, and address (location) are provided to you in discharge paperwork.  -Take your psychiatric medications as prescribed at discharge - instructions are provided to you in the discharge paperwork  -Follow-up with outpatient primary care doctor and other specialists -for management of preventative medicine and chronic medical disease  -If you are prescribed an atypical antipsychotic medication, we recommend that your outpatient psychiatrist follow routine screening for side effects within 3 months of discharge, including monitoring: AIMS scale, height, weight, blood pressure, fasting lipid panel, HbA1c, and fasting blood sugar.   -Recommend total abstinence from alcohol, tobacco, and other illicit drug use at discharge.   -If your psychiatric symptoms recur, worsen, or if you have side effects to your psychiatric medications, call your outpatient psychiatric provider, 911, 988 or go to the nearest emergency department.  -If suicidal thoughts occur, immediately call your outpatient psychiatric provider, 911, 988 or go to the nearest emergency  department.   Signed: Cristy Hilts, MD 07/25/2022, 10:01 AM  Total Time Spent in Direct Patient Care:  I personally spent 35 minutes on the unit in direct patient care. The direct patient care time included face-to-face time with the patient, reviewing the patient's chart, communicating with other professionals, and coordinating care. Greater than 50% of this time was spent in counseling or coordinating care with the patient regarding goals of hospitalization, psycho-education, and discharge planning needs.   Phineas Inches, MD Psychiatrist

## 2022-07-25 NOTE — Progress Notes (Addendum)
D: Pt A & O X 3. Denies SI, HI, AVH and pain at this time. D/C home as ordered. Picked up in front of facility by [employer] cab.  A: D/C instructions reviewed with pt including prescriptions and follow up appointment, compliance encouraged. All belongings from assigned locker returned to pt at time of departure. Scheduled medications given with verbal education and effects monitored. Safety checks maintained without incident till time of d/c.  R: Pt receptive to care. Compliant with medications when offered. Denies adverse drug reactions when assessed. Verbalized understanding related to d/c instructions. Signed belonging sheet in agreement with items received from locker. Ambulatory with a steady gait. Appears to be in no physical distress at time of departure.
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
## 2022-07-25 NOTE — Progress Notes (Signed)
   07/24/22 2015  Psych Admission Type (Psych Patients Only)  Admission Status Voluntary  Psychosocial Assessment  Patient Complaints None  Eye Contact Fair  Facial Expression Animated  Affect Appropriate to circumstance  Speech Logical/coherent  Interaction Assertive  Motor Activity Other (Comment)  Appearance/Hygiene Unremarkable  Behavior Characteristics Cooperative;Appropriate to situation  Mood Pleasant  Thought Process  Coherency WDL  Content WDL  Delusions None reported or observed  Perception WDL  Hallucination None reported or observed  Judgment Poor  Confusion None  Danger to Self  Current suicidal ideation? Denies  Agreement Not to Harm Self Yes  Description of Agreement verbally contract for safety.  Danger to Others  Danger to Others None reported or observed

## 2022-07-25 NOTE — Progress Notes (Signed)
Patient denies SI, HI and AVH, denies any depression or anxiety. Patient is ready to be discharged. Patient remains safe on the unit. Q 15 min safety checks ongoing.   07/25/22 0800  Psych Admission Type (Psych Patients Only)  Admission Status Voluntary  Psychosocial Assessment  Patient Complaints None  Eye Contact Fair  Facial Expression Animated  Affect Appropriate to circumstance  Speech Logical/coherent  Interaction Assertive  Motor Activity Other (Comment) (WDL)  Appearance/Hygiene Unremarkable  Behavior Characteristics Cooperative;Appropriate to situation  Mood Pleasant  Thought Process  Coherency WDL  Content WDL  Delusions None reported or observed  Perception WDL  Hallucination None reported or observed  Judgment Impaired  Confusion None  Danger to Self  Current suicidal ideation? Denies  Agreement Not to Harm Self Yes  Description of Agreement verbal  Danger to Others  Danger to Others None reported or observed

## 2022-07-25 NOTE — Discharge Instructions (Signed)
-  Follow-up with your outpatient psychiatric provider -instructions on appointment date, time, and address (location) are provided to you in discharge paperwork.  -Take your psychiatric medications as prescribed at discharge - instructions are provided to you in the discharge paperwork  -Follow-up with outpatient primary care doctor and other specialists -for management of preventative medicine and any chronic medical disease.  -Recommend abstinence from alcohol, tobacco, and other illicit drug use at discharge.   -If your psychiatric symptoms recur, worsen, or if you have side effects to your psychiatric medications, call your outpatient psychiatric provider, 911, [phone] or go to the nearest emergency department.  -If suicidal thoughts occur, call your outpatient psychiatric provider, 911, [phone] or go to the nearest emergency department.  Naloxone (Narcan) can help reverse an overdose when given to the victim quickly.  [place] offers free naloxone kits and instructions/training on its use.  Add naloxone to your first aid kit and you can help save a life.   Pick up your free kit at the following locations:   [place]:  [facility], [address] [place] [address] ([phone]) [facility] [address] [place] [id number] ([phone]) [facility] [address] [place] [address]  [place]: [facility] [address] [place] [address] ([phone]) [facility] [address] [place] [address] ([phone])  

## 2022-07-25 NOTE — Progress Notes (Signed)
  [facility] Adult Case Management Discharge Plan :  Will you be returning to the same living situation after discharge:  No. Pt will be staying with Georgiana Spinner At discharge, do you have transportation home?: Yes,  taxi from the hospital Do you have the ability to pay for your medications: Yes,  insurance   Release of information consent forms completed and in the chart;  Patient's signature needed at discharge.  Patient to Follow up at:  Follow-up Information     [employer], [employer]. Go to.   Why: Please return to this provider for therapy and medication management services. Contact information: [address] [place] [address] [phone]                 Next level of care provider has access to [facility] Link:no  Safety Planning and Suicide Prevention discussed: Yes,  Uncle Maurine Minister and Malachi Carl     Has patient been referred to the Quitline?: Patient refused referral for treatment  Patient has been referred for addiction treatment: Yes, referral information given but appointment not made [facility] (list facility).  Marrie Chandra E Erdem Naas, LCSW 07/25/2022, 9:46 AM

## 2022-07-25 NOTE — Plan of Care (Signed)
  Problem: Fluid Volume: Goal: Ability to maintain a balanced intake and output will improve Outcome: Progressing   Problem: Health Behavior/Discharge Planning: Goal: Ability to identify and utilize available resources and services will improve Outcome: Progressing Goal: Ability to manage health-related needs will improve Outcome: Progressing   Problem: Nutritional: Goal: Progress toward achieving an optimal weight will improve Outcome: Progressing   Problem: Skin Integrity: Goal: Risk for impaired skin integrity will decrease Outcome: Progressing   Problem: Tissue Perfusion: Goal: Adequacy of tissue perfusion will improve Outcome: Progressing   Problem: Education: Goal: Knowledge of [place] Education information/materials will improve Outcome: Progressing

## 2022-10-14 ENCOUNTER — Encounter (HOSPITAL_COMMUNITY): Payer: Self-pay | Admitting: Psychiatry

## 2022-10-14 ENCOUNTER — Other Ambulatory Visit: Payer: Self-pay

## 2022-10-14 ENCOUNTER — Inpatient Hospital Stay (HOSPITAL_COMMUNITY)
Admission: AD | Admit: 2022-10-14 | Discharge: 2022-10-21 | DRG: 885 | Disposition: A | Discharge status: Home / Self Care | Payer: MEDICAID | Source: Intra-hospital | Attending: Psychiatry | Admitting: Psychiatry

## 2022-10-14 DIAGNOSIS — F1290 Cannabis use, unspecified, uncomplicated: Secondary | ICD-10-CM | POA: Insufficient documentation

## 2022-10-14 DIAGNOSIS — F332 Major depressive disorder, recurrent severe without psychotic features: Secondary | ICD-10-CM | POA: Diagnosis present

## 2022-10-14 DIAGNOSIS — R45851 Suicidal ideations: Secondary | ICD-10-CM | POA: Diagnosis present

## 2022-10-14 DIAGNOSIS — Z5900 Homelessness unspecified: Secondary | ICD-10-CM

## 2022-10-14 DIAGNOSIS — Z832 Family history of diseases of the blood and blood-forming organs and certain disorders involving the immune mechanism: Secondary | ICD-10-CM

## 2022-10-14 DIAGNOSIS — K219 Gastro-esophageal reflux disease without esophagitis: Secondary | ICD-10-CM | POA: Diagnosis present

## 2022-10-14 DIAGNOSIS — E1151 Type 2 diabetes mellitus with diabetic peripheral angiopathy without gangrene: Secondary | ICD-10-CM | POA: Diagnosis present

## 2022-10-14 DIAGNOSIS — Z5982 Transportation insecurity: Secondary | ICD-10-CM | POA: Diagnosis not present

## 2022-10-14 DIAGNOSIS — Z8249 Family history of ischemic heart disease and other diseases of the circulatory system: Secondary | ICD-10-CM | POA: Diagnosis not present

## 2022-10-14 DIAGNOSIS — Z79899 Other long term (current) drug therapy: Secondary | ICD-10-CM

## 2022-10-14 DIAGNOSIS — K589 Irritable bowel syndrome without diarrhea: Secondary | ICD-10-CM | POA: Diagnosis present

## 2022-10-14 DIAGNOSIS — Z5941 Food insecurity: Secondary | ICD-10-CM | POA: Diagnosis not present

## 2022-10-14 DIAGNOSIS — G8929 Other chronic pain: Secondary | ICD-10-CM | POA: Diagnosis present

## 2022-10-14 DIAGNOSIS — F322 Major depressive disorder, single episode, severe without psychotic features: Secondary | ICD-10-CM | POA: Diagnosis present

## 2022-10-14 DIAGNOSIS — Z9151 Personal history of suicidal behavior: Secondary | ICD-10-CM | POA: Diagnosis not present

## 2022-10-14 DIAGNOSIS — Z833 Family history of diabetes mellitus: Secondary | ICD-10-CM | POA: Diagnosis not present

## 2022-10-14 DIAGNOSIS — E1165 Type 2 diabetes mellitus with hyperglycemia: Secondary | ICD-10-CM | POA: Diagnosis present

## 2022-10-14 DIAGNOSIS — F1420 Cocaine dependence, uncomplicated: Secondary | ICD-10-CM | POA: Insufficient documentation

## 2022-10-14 DIAGNOSIS — B9681 Helicobacter pylori [H. pylori] as the cause of diseases classified elsewhere: Secondary | ICD-10-CM

## 2022-10-14 DIAGNOSIS — Z5986 Financial insecurity: Secondary | ICD-10-CM | POA: Diagnosis not present

## 2022-10-14 DIAGNOSIS — Z91199 Patient's noncompliance with other medical treatment and regimen due to unspecified reason: Secondary | ICD-10-CM | POA: Diagnosis not present

## 2022-10-14 DIAGNOSIS — D573 Sickle-cell trait: Secondary | ICD-10-CM | POA: Diagnosis present

## 2022-10-14 DIAGNOSIS — F1210 Cannabis abuse, uncomplicated: Secondary | ICD-10-CM | POA: Diagnosis present

## 2022-10-14 DIAGNOSIS — F17210 Nicotine dependence, cigarettes, uncomplicated: Secondary | ICD-10-CM | POA: Diagnosis present

## 2022-10-14 DIAGNOSIS — I10 Essential (primary) hypertension: Secondary | ICD-10-CM | POA: Diagnosis present

## 2022-10-14 DIAGNOSIS — F419 Anxiety disorder, unspecified: Secondary | ICD-10-CM | POA: Diagnosis present

## 2022-10-14 DIAGNOSIS — E119 Type 2 diabetes mellitus without complications: Secondary | ICD-10-CM

## 2022-10-14 LAB — GLUCOSE, CAPILLARY
Glucose-Capillary: 337 mg/dL — ABNORMAL HIGH (ref 70–99)
Glucose-Capillary: 443 mg/dL — ABNORMAL HIGH (ref 70–99)

## 2022-10-14 MED ORDER — LORAZEPAM 1 MG PO TABS: 1.0000 mg | ORAL_TABLET | ORAL | Status: DC | PRN

## 2022-10-14 MED ORDER — INSULIN ASPART 100 UNIT/ML IJ SOLN
0.0000 [IU] | Freq: Three times a day (TID) | INTRAMUSCULAR | Status: DC
  Administered 2022-10-15: 11 [IU] via SUBCUTANEOUS
  Administered 2022-10-15: 3 [IU] via SUBCUTANEOUS
  Administered 2022-10-15: 5 [IU] via SUBCUTANEOUS
  Administered 2022-10-16: 11 [IU] via SUBCUTANEOUS
  Administered 2022-10-16: 5 [IU] via SUBCUTANEOUS
  Administered 2022-10-16: 2 [IU] via SUBCUTANEOUS
  Administered 2022-10-17 (×2): 8 [IU] via SUBCUTANEOUS
  Administered 2022-10-17 – 2022-10-18 (×4): 3 [IU] via SUBCUTANEOUS
  Administered 2022-10-19: 2 [IU] via SUBCUTANEOUS
  Administered 2022-10-19: 5 [IU] via SUBCUTANEOUS
  Administered 2022-10-19 – 2022-10-20 (×2): 3 [IU] via SUBCUTANEOUS
  Administered 2022-10-20: 5 [IU] via SUBCUTANEOUS
  Administered 2022-10-21: 3 [IU] via SUBCUTANEOUS

## 2022-10-14 MED ORDER — ARIPIPRAZOLE 10 MG PO TABS
10.0000 mg | ORAL_TABLET | Freq: Every day | ORAL | Status: DC
  Administered 2022-10-16 – 2022-10-21 (×6): 10 mg via ORAL
  Filled 2022-10-14 (×9): qty 1

## 2022-10-14 MED ORDER — HYDROXYZINE HCL 25 MG PO TABS
25.0000 mg | ORAL_TABLET | Freq: Four times a day (QID) | ORAL | Status: DC | PRN
  Administered 2022-10-14 – 2022-10-21 (×4): 25 mg via ORAL
  Filled 2022-10-14 (×4): qty 1

## 2022-10-14 MED ORDER — INSULIN ASPART 100 UNIT/ML IJ SOLN
7.0000 [IU] | Freq: Once | INTRAMUSCULAR | Status: AC
  Administered 2022-10-14: 7 [IU] via SUBCUTANEOUS

## 2022-10-14 MED ORDER — ARIPIPRAZOLE 5 MG PO TABS
5.0000 mg | ORAL_TABLET | Freq: Every day | ORAL | Status: AC
  Administered 2022-10-14 – 2022-10-15 (×2): 5 mg via ORAL
  Filled 2022-10-14 (×3): qty 1

## 2022-10-14 MED ORDER — INSULIN ASPART 100 UNIT/ML IJ SOLN
0.0000 [IU] | Freq: Every day | INTRAMUSCULAR | Status: DC
  Administered 2022-10-15: 3 [IU] via SUBCUTANEOUS

## 2022-10-14 MED ORDER — TRAZODONE HCL 50 MG PO TABS
50.0000 mg | ORAL_TABLET | Freq: Every evening | ORAL | Status: DC | PRN
  Administered 2022-10-14 – 2022-10-20 (×7): 50 mg via ORAL
  Filled 2022-10-14 (×7): qty 1

## 2022-10-14 MED ORDER — MAGNESIUM HYDROXIDE 400 MG/5ML PO SUSP: 30.0000 mL | Freq: Every day | ORAL | Status: DC | PRN

## 2022-10-14 MED ORDER — PANTOPRAZOLE SODIUM 40 MG PO TBEC
40.0000 mg | DELAYED_RELEASE_TABLET | Freq: Every day | ORAL | Status: DC
  Administered 2022-10-14 – 2022-10-21 (×8): 40 mg via ORAL
  Filled 2022-10-14 (×11): qty 1

## 2022-10-14 MED ORDER — ALUM & MAG HYDROXIDE-SIMETH 200-200-20 MG/5ML PO SUSP: 30.0000 mL | ORAL | Status: DC | PRN

## 2022-10-14 MED ORDER — GABAPENTIN 300 MG PO CAPS
600.0000 mg | ORAL_CAPSULE | Freq: Three times a day (TID) | ORAL | Status: DC
  Administered 2022-10-14 – 2022-10-21 (×22): 600 mg via ORAL
  Filled 2022-10-14 (×30): qty 2

## 2022-10-14 MED ORDER — ZIPRASIDONE MESYLATE 20 MG IM SOLR: 20.0000 mg | INTRAMUSCULAR | Status: DC | PRN

## 2022-10-14 MED ORDER — ACETAMINOPHEN 325 MG PO TABS: 650.0000 mg | ORAL_TABLET | Freq: Four times a day (QID) | ORAL | Status: DC | PRN

## 2022-10-14 MED ORDER — NICOTINE POLACRILEX 2 MG MT GUM: 2.0000 mg | CHEWING_GUM | OROMUCOSAL | Status: DC | PRN

## 2022-10-14 MED ORDER — OXYCODONE-ACETAMINOPHEN 5-325 MG PO TABS
1.0000 | ORAL_TABLET | Freq: Three times a day (TID) | ORAL | Status: DC | PRN
  Administered 2022-10-14 – 2022-10-21 (×14): 1 via ORAL
  Filled 2022-10-14 (×14): qty 1

## 2022-10-14 MED ORDER — OLANZAPINE 5 MG PO TBDP: 5.0000 mg | ORAL_TABLET | Freq: Three times a day (TID) | ORAL | Status: DC | PRN

## 2022-10-14 MED ORDER — FLUOXETINE HCL 20 MG PO CAPS
20.0000 mg | ORAL_CAPSULE | Freq: Every day | ORAL | Status: DC
  Administered 2022-10-14 – 2022-10-21 (×8): 20 mg via ORAL
  Filled 2022-10-14 (×11): qty 1

## 2022-10-14 NOTE — Group Note (Signed)
Date:  10/14/2022 Time:  8:58 PM  Group Topic/Focus:  Wrap-Up Group:   The focus of this group is to help patients review their daily goal of treatment and discuss progress on daily workbooks.    Participation Level:  Minimal  Participation Quality:  Appropriate and Sharing  Affect:  Appropriate  Cognitive:  Appropriate  Insight: Appropriate and Limited  Engagement in Group:  Engaged and Limited  Modes of Intervention:  Activity and Socialization  Additional Comments:  The patient described her day as being a "50/50" type of day. The patient shared that she did just get on the unit today. The patient stated that she is indecisive on receiving medication. The patient stated that she wants to "recover" from the events that took place prior to coming here. The patient rated her day a 4/10. The patient stated that her goal for tomorrow is to have a better day.   Kennieth Francois 10/14/2022, 8:58 PM

## 2022-10-14 NOTE — H&P (Signed)
Psychiatric Admission Assessment Adult  Patient Identification: Sara [name] MRN:  [id number] Date of Evaluation:  10/14/2022  Chief Complaint:  MDD (major depressive disorder), severe (HCC) [F32.2]  History of Present Illness:  Sara [name] is a 47 y.o., female with a past psychiatric history significant for depression and substance use who presents to the [facility] from [facility] under IVC for evaluation and management of worsening depression and SI.  According to outside records, the patient presented to [facility] emergency room reporting worsening depression, SI with plan to overdose.  Initial assessment on 9/17, patient was evaluated on the inpatient unit, the patient reports worsening depression and SI in general for the past year but specifically for the past week related to homelessness and drug use she reports increased SI over the past week,she reported she did overdose prior to going to emergency room on Benadryl and Tylenol to kill herself "I tried but I threw them up" she continues to report passive SI wishing self dead feeling hopeless helpless and worthless with active SI if outside the hospital, unable to contract for safety outside the hospital, able to contract for safety while in the hospital.  She reports vague HI when she feels irritable and feels like harming somebody, but denies any specific target intention or plan.  She denies AVH or paranoia she denies any symptoms consistent with mania or hypomania except while using cocaine.  She denies any symptoms consistent with PTSD. She reports main stress related to homelessness as well as fears to have her disability check and food stamps discontinued secondary to homelessness.  Chart review: Percocet prescription 10/325 mg 3 times daily 30 days last prescribed July 2024  Psych meds prior to admission:  Was supposed to be using Prozac 20 mg daily and Abilify 10 mg daily in addition to trazodone 50 mg at  bedtime as needed for sleep but noncompliant since discharge in June  Home medication list from [facility] where patient was IVC included Abilify, Prozac, gabapentin, Percocet, trazodone, omeprazole, metformin  Sleep Poor  Collateral information: None at this time  Past Psychiatric History:  Prior Psychiatric diagnoses: Reports history of diagnosed bipolar disorder but unable to specify symptoms consistent with mania or hypomania except in the presence of cocaine use Past Psychiatric Hospitalizations: 2 previous hospitalizations to this facility May 2024 for 4 days in June 2024 for 7 days, discharge medications in June included Prozac, Abilify, gabapentin, Percocet, trazodone  History of self mutilation: Denies Past suicide attempts: 2 previous suicide attempts last time years ago by overdose Past history of HI, violent or aggressive behavior: History of getting in fights related to irritability  Past Psychiatric medications trials: History of good response to Prozac and Abilify History of ECT/TMS: Denies  Outpatient psychiatric Follow up: When discharged from [facility] in June.  Hospital follow-up [facility] but noncompliant Prior Outpatient Therapy: Noncompliant    Is the patient at risk to self? Yes.    Has the patient been a risk to self in the past 6 months? Yes.    Has the patient been a risk to self within the distant past? No.  Is the patient a risk to others? No.  Has the patient been a risk to others in the past 6 months? No.  Has the patient been a risk to others within the distant past? No.    Substance Use History: Alcohol: Reports sporadic alcohol use but not regularly, reports "I do not like drinking it makes me nauseated" Tobacoo:  Smokes average 1 pack of cigarette daily Marijuana: Reports smoking marijuana daily varying amounts Cocaine: Reports snorting cocaine daily varying amounts Stimulants: Denies IV drug use: Denies Opiates: Denies misuse or abuse of  Percocet prescription Prescribed Meds abuse: Denies H/O withdrawals, blackouts, DTs: Denies History of Detox / Rehab: History of inpatient rehab years ago, unable to recall details DUI: Denies  Alcohol Screening: 1. How often do you have a drink containing alcohol?: Never 2. How many drinks containing alcohol do you have on a typical day when you are drinking?: 1 or 2 3. How often do you have six or more drinks on one occasion?: Weekly AUDIT-C Score: 3 4. How often during the last year have you found that you were not able to stop drinking once you had started?: Never 6. How often during the last year have you needed a first drink in the morning to get yourself going after a heavy drinking session?: Never 8. How often during the last year have you been unable to remember what happened the night before because you had been drinking?: Never 10. Has a relative or friend or a doctor or another health worker been concerned about your drinking or suggested you cut down?: No Alcohol Brief Interventions/Follow-up: Alcohol education/Brief advice  Substance Abuse History in the last 12 months:  Yes.     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 pylori infection    High risk medication use    Hypercholesterolemia    Hyperlipidemia, mixed    Hypertension    Other mixed anxiety disorders    Pollen allergies    Sickle cell trait (HCC)    Sleep apnea    Vitamin D deficiency     Past Surgical History:  Procedure Laterality Date   ABDOMINAL HYSTERECTOMY     APPENDECTOMY     COLONOSCOPY  08-16-2008   Dr. Vallarie Mare   ESOPHAGOGASTRODUODENOSCOPY  08-16-2008   Dr. Rayfield Citizen     Family History:  Family History  Problem Relation Age of Onset   Clotting disorder  Mother    Crohn's disease Mother    Heart disease Mother    Colon cancer Father    Clotting disorder Brother    Diabetes Brother    Diabetes Maternal Aunt    Liver cancer Maternal Uncle    Prostate cancer Maternal Uncle    Colon cancer Maternal Uncle    Diabetes Paternal Aunt    Esophageal cancer Paternal Uncle     Family Psychiatric History:  Psychiatric illness: Reports mother and uncle had bipolar disorder Suicide: Reports mother had history of suicide attempts Substance Abuse: Unknown  Social History:  Social History   Substance and Sexual Activity  Alcohol Use Not Currently     Social History   Substance and Sexual Activity  Drug Use Not Currently   Types: Marijuana, Cocaine   Comment: 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    Living situation: Homeless for the past 4 years Social support: Poor social support Marital Status: Was married once, divorced since age 41 years old Children: 2 sons ages 72 and 86 lives in [place] and [place], patient reports being estranged from the Education: 10th grade Employment: Reports  last job 2 to 3 months ago 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 Legal history: History of jail time related to fights years ago, history of prison time related to driving without license, years ago Trauma: Denies Access to guns: Denies   Allergies:   Allergies  Allergen Reactions   Clarithromycin Itching and Rash   Doxycycline Anaphylaxis and Rash   Penicillins Anaphylaxis and Rash   Tetracyclines & Related Anaphylaxis   Metronidazole Itching and Rash   Red Dye #40 (Allura Red) Itching    Likely red dye allergy - rx with Tylenol elixir, can tolerate tablets    Lab Results: No results found for this or any previous visit (from the past 48 hour(s)).  Blood Alcohol level:  Lab Results  Component Value Date   ETH <10 06/17/2022   [facility]  12/11/2009    <5        LOWEST DETECTABLE LIMIT FOR SERUM ALCOHOL IS 5 mg/dL FOR MEDICAL PURPOSES ONLY     Metabolic Disorder Labs:  Lab Results  Component Value Date   HGBA1C 12.7 (H) 02/01/2022   MPG 318 02/01/2022   No results found for: "PROLACTIN" Lab Results  Component Value Date   CHOL 204 (H) 02/01/2022   TRIG 61 02/01/2022   HDL 61 02/01/2022   CHOLHDL 3.3 02/01/2022   VLDL 12 02/01/2022   LDLCALC 131 (H) 02/01/2022    Current Medications: Current Facility-Administered Medications  Medication Dose Route Frequency Provider Last Rate Last Admin   alum & mag hydroxide-simeth (MAALOX/MYLANTA) 200-200-20 MG/5ML suspension 30 mL  30 mL Oral Q4H PRN Ardis Hughs, NP       ARIPiprazole (ABILIFY) tablet 5 mg  5 mg Oral Daily Abbott Pao, Shlomo Seres, MD       Followed by   Melene Muller ON 10/16/2022] ARIPiprazole (ABILIFY) tablet 10 mg  10 mg Oral Daily Donja Tipping, MD       FLUoxetine (PROZAC) capsule 20 mg  20 mg Oral Daily Margurite Duffy, MD       gabapentin (NEURONTIN) capsule 600 mg  600 mg Oral TID Sarita Bottom, MD       hydrOXYzine (ATARAX) tablet 25 mg  25 mg Oral Q6H PRN Abbott Pao, Alfrieda Tarry, MD   25 mg at 10/14/22 1306   magnesium hydroxide (MILK OF MAGNESIA) suspension 30 mL  30 mL Oral Daily PRN Ardis Hughs, NP       nicotine polacrilex (NICORETTE) gum 2 mg  2 mg Oral PRN Abbott Pao, Khadeem Rockett, MD       OLANZapine zydis (ZYPREXA) disintegrating tablet 5 mg  5 mg Oral Q8H PRN Abbott Pao, Rael Tilly, MD       oxyCODONE-acetaminophen (PERCOCET/ROXICET) 5-325 MG per tablet 1 tablet  1 tablet Oral Q8H PRN Abbott Pao, Namira Rosekrans, MD       pantoprazole (PROTONIX) EC tablet 40 mg  40 mg Oral Daily Sammie Schermerhorn, MD       traZODone (DESYREL) tablet 50 mg  50 mg Oral QHS PRN Ardis Hughs, NP        PTA Medications: Medications Prior to Admission  Medication Sig Dispense Refill Last Dose   albuterol (VENTOLIN HFA) 108 (90 Base) MCG/ACT inhaler Inhale 1-2 puffs into the lungs every 6 (six) hours as needed for wheezing or shortness of breath.      ARIPiprazole (ABILIFY) 10 MG tablet Take 1 tablet (10 mg  total) by mouth at bedtime. 30 tablet 0    FLUoxetine (PROZAC) 20 MG capsule Take 1 capsule (20 mg total) by mouth daily. 30  capsule 0    gabapentin (NEURONTIN) 300 MG capsule Take 2 capsules (600 mg total) by mouth 3 (three) times daily. 180 capsule 0    metoprolol succinate (TOPROL-XL) 25 MG 24 hr tablet Take 0.5 tablets (12.5 mg total) by mouth daily. 15 tablet 0    nicotine polacrilex (NICORETTE) 2 MG gum Take 1 each (2 mg total) by mouth as needed for smoking cessation. 100 tablet 0    oxyCODONE-acetaminophen (PERCOCET) 10-325 MG tablet Take 1 tablet by mouth 3 (three) times daily.      psyllium (HYDROCIL/METAMUCIL) 95 % PACK Take 1 packet by mouth daily. 240 each     traZODone (DESYREL) 50 MG tablet Take 1 tablet (50 mg total) by mouth at bedtime as needed for sleep. 30 tablet 0     Musculoskeletal: Strength & Muscle Tone: within normal limits Gait & Station: normal Patient leans: N/A   Physical Findings: AIMS: Facial and Oral Movements Muscles of Facial Expression: None, normal Lips and Perioral Area: None, normal Jaw: None, normal Tongue: None, normal,Extremity Movements Upper (arms, wrists, hands, fingers): None, normal Lower (legs, knees, ankles, toes): None, normal, Trunk Movements Neck, shoulders, hips: None, normal, Overall Severity Severity of abnormal movements (highest score from questions above): None, normal Incapacitation due to abnormal movements: None, normal Patient's awareness of abnormal movements (rate only patient's report): No Awareness, Dental Status Current problems with teeth and/or dentures?: No Does patient usually wear dentures?: No  CIWA:    COWS:     Psychiatric Specialty Exam:  General Appearance: Appears older than stated age, dressed in hospital gown, disheveled with below average hygiene  Behavior: Cooperative but irritable at times  Psychomotor Activity: Mild psychomotor agitation and irritability noted, no retardation noted  Eye  Contact: Limited Speech: Normal   Mood: Dysphoric Affect: Sad affect, tearful when talking about stressors, occasionally irritable  Thought Process: Linear and goal-directed Descriptions of Associations: Concrete Thought Content: Hallucinations: Denies AH, VH, does not appear responding to stimuli Delusions: No paranoia  Suicidal Thoughts: Admits to passive SI wishing self dead and active SI with plan to overdose, able to contract for safety in the hospital but not is outside of the hospital Homicidal Thoughts: Vague HI noting unspecific HI if getting irritated but denies any specific target, intention, plan   Alertness/Orientation: Oriented  Insight: poor Judgment: poor  Memory: intact  Executive Functions  Concentration: Fair Attention Span: Fair Recall: [employer] of Knowledge: Fair   Physical Exam:  Physical Exam Vitals and nursing note reviewed.  Constitutional:      Appearance: Normal appearance.  HENT:     Head: Normocephalic and atraumatic.     Nose: Nose normal.  Eyes:     Extraocular Movements: Extraocular movements intact.     Pupils: Pupils are equal, round, and reactive to light.  Pulmonary:     Effort: Pulmonary effort is normal.  Musculoskeletal:        General: Normal range of motion.  Neurological:     General: No focal deficit present.     Mental Status: She is alert and oriented to person, place, and time.    Review of Systems  Musculoskeletal:  Positive for back pain.  Neurological:        Complains of neuropathic pain in lower extremities  All other systems reviewed and are negative.  Height 5\' 4"  (1.626 m), weight 62 kg. Body mass index is 23.45 kg/m.   Assets  Assets:Communication Skills; Desire for Improvement    Treatment Plan  Summary: Daily contact with patient to assess and evaluate symptoms and progress in treatment and Medication management  ASSESSMENT:  Principal Diagnosis: Major depressive disorder, recurrent severe  without psychotic features (HCC) Diagnosis:  Principal Problem:   Major depressive disorder, recurrent severe without psychotic features (HCC) Active Problems:   Moderate cocaine use disorder (HCC)   Cannabis use disorder   PLAN: Safety and Monitoring:  -- Involuntary admission to inpatient psychiatric unit for safety, stabilization and treatment  -- Daily contact with patient to assess and evaluate symptoms and progress in treatment  -- Patient's case to be discussed in multi-disciplinary team meeting  -- Observation Level : q15 minute checks  -- Vital signs:  q12 hours  -- Precautions: suicide, elopement, and assault  2. Medications:   Restarted Prozac 20 mg daily for management of depression and anxiety  Restart Abilify 5 mg daily for mood stabilization and depression, titrate to 10 mg daily in the next few days if well-tolerated  Restarted gabapentin 600 mg 3 times daily for neuropathic pain, home medication  Start pantoprazole 40 mg daily for GERD, patient was on omeprazole 20 mg daily home medication  Metformin twice daily was listed as home medication at [facility] emergency room note but patient denies she was receiving metformin, history of metformin treatment that led to stomach upset and diarrhea, patient does not recall her home regimen for diabetes and has not been compliant with, consulted diabetes management team for further management.  Restart Percocet 5/325 3 times daily as needed for chronic pain, patient was on 10/325 3 times daily as needed for pain as home medication  Monitor blood pressure and consider starting hydrochlorothiazide 25 mg daily if elevated blood pressure noted, please note at emergency room patient was noted to have what elevated blood pressure reading required hydrochlorothiazide treatment, will follow.  --  The risks/benefits/side-effects/alternatives to this medication were discussed in detail with the patient and time was given for questions. The  patient consents to medication trial.    -- Metabolic profile and EKG monitoring obtained while on an atypical antipsychotic (BMI: Lipid Panel: HbgA1c: QTc:)       3. Labs Reviewed: Lab reviewed from paper chart from [facility] including UDS negative, CBC no abnormalities noted, CMP no significant abnormalities noted      Lab ordered: EKG, hemoglobin A1c, lipid panel   4. Tobacco Use Disorder  -- Patient refuses NicoDerm patch, was started on nicotine gum per her request  -- Smoking cessation encouraged  5. Group and Therapy: -- Encouraged patient to participate in unit milieu and in scheduled group therapies   --Substance Use counseling: Patient was counseled regarding need to abstain completely from marijuana and cocaine after discharge.  6. Discharge Planning:   -- Social work and case management to assist with discharge planning and identification of hospital follow-up needs prior to discharge  -- Estimated LOS: 5-7 days  -- Discharge Concerns: Need to establish a safety plan; Medication compliance and effectiveness  -- Discharge Goals: Return home with outpatient referrals for mental health follow-up including medication management/psychotherapy  Patient main stress related to being homeless, ongoing substance use problem including marijuana and cocaine, patient is interested in contacting halfway houses, will follow.  The patient is agreeable with the medication plan, as above. We will monitor the patient's response to pharmacologic treatment, and adjust medications as necessary. Patient is encouraged to participate in group therapy while admitted to the psychiatric unit. We will address other chronic and acute stressors, which contributed  to the patient's worsening depression and SI, in order to reduce the risk of self-harm at discharge.   Physician Treatment Plan for Primary Diagnosis: Major depressive disorder, recurrent severe without psychotic features (HCC) Long  Term Goal(s): Improvement in symptoms so as ready for discharge  Short Term Goals: Ability to identify changes in lifestyle to reduce recurrence of condition will improve, Ability to verbalize feelings will improve, Ability to disclose and discuss suicidal ideas, Ability to demonstrate self-control will improve, and Ability to identify and develop effective coping behaviors will improve   I certify that inpatient services furnished can reasonably be expected to improve the patient's condition.    Total Time Spent in Direct Patient Care:  I personally spent 55 minutes on the unit in direct patient care. The direct patient care time included face-to-face time with the patient, reviewing the patient's chart, communicating with other professionals, and coordinating care. Greater than 50% of this time was spent in counseling or coordinating care with the patient regarding goals of hospitalization, psycho-education, and discharge planning needs.    Sarita Bottom, MD 9/17/20243:00 PM

## 2022-10-14 NOTE — Progress Notes (Signed)
This is the 3rd [facility] admission for this 47 yo AAF who presents involuntary due to SI  with a plan to overdose and  prior overdose before going to ED. Pt continues to endorse SI in the contents of overdosing if an opportunity presents, feeling hopeless and despair due to poor socioeconomic status and drug use, "I'M just tired". Pt was unable to verbalize a safety commitment at time of admission, "I just don't know". Pt reports a hx DM#2 and manage with medication to include SS insulin. Pt assessed with numerous tattoos to ULE and neck with an ols scar to L forearm r/t "dog bite". Pt QA&O x 4, did not present in any acute physical distress. Pt placed on q 15 min checks for safety and support

## 2022-10-14 NOTE — Progress Notes (Addendum)
Latest Reference Range & Units 10/14/22 22:08  Glucose-Capillary 70 - 99 mg/dL 063 (H)  (H): Data is abnormally high  Pt is asymptomatic. Notified Delora Fuel, NP. One time dose order for 7 units of Insulin aspart was placed. Advised by on-call NP to not give bedtime insulin sliding scale to pt.  Pt was given 7 units of Insulin.

## 2022-10-14 NOTE — Plan of Care (Signed)
?  Problem: Education: ?Goal: Knowledge of [facility] Education information/materials will improve ?Outcome: Not Progressing ?Goal: Emotional status will improve ?Outcome: Not Progressing ?Goal: Mental status will improve ?Outcome: Not Progressing ?Goal: Verbalization of understanding the information provided will improve ?Outcome: Not Progressing ?  ?Problem: Activity: ?Goal: Interest or engagement in activities will improve ?Outcome: Not Progressing ?Goal: Sleeping patterns will improve ?Outcome: Not Progressing ?  ?Problem: Coping: ?Goal: Ability to verbalize frustrations and anger appropriately will improve ?Outcome: Not Progressing ?Goal: Ability to demonstrate self-control will improve ?Outcome: Not Progressing ?  ?Problem: Health Behavior/Discharge Planning: ?Goal: Identification of resources available to assist in meeting health care needs will improve ?Outcome: Not Progressing ?Goal: Compliance with treatment plan for underlying cause of condition will improve ?Outcome: Not Progressing ?  ?Problem: Physical Regulation: ?Goal: Ability to maintain clinical measurements within normal limits will improve ?Outcome: Not Progressing ?  ?Problem: Safety: ?Goal: Periods of time without injury will increase ?Outcome: Not Progressing ?  ?

## 2022-10-14 NOTE — Group Note (Signed)
LCSW Group Therapy Note   Group Date: 10/14/2022 Start Time: 1100 End Time: 1200   Type of Therapy and Topic:  Group Therapy: Boundaries  Participation Level:  Active  Description of Group: This group will address the use of boundaries in their personal lives. Patients will explore why boundaries are important, the difference between healthy and unhealthy boundaries, and negative and postive outcomes of different boundaries and will look at how boundaries can be crossed.  Patients will be encouraged to identify current boundaries in their own lives and identify what kind of boundary is being set. Facilitators will guide patients in utilizing problem-solving interventions to address and correct types boundaries being used and to address when no boundary is being used. Understanding and applying boundaries will be explored and addressed for obtaining and maintaining a balanced life. Patients will be encouraged to explore ways to assertively make their boundaries and needs known to significant others in their lives, using other group members and facilitator for role play, support, and feedback.  Therapeutic Goals:  1.  Patient will identify areas in their life where setting clear boundaries could be  used to improve their life.  2.  Patient will identify signs/triggers that a boundary is not being respected. 3.  Patient will identify two ways to set boundaries in order to achieve balance in  their lives: 4.  Patient will demonstrate ability to communicate their needs and set boundaries  through discussion and/or role plays  Summary of Patient Progress:  Sara [name] was [name]/active throughout the session and proved open to feedback from CSW and peers. Patient demonstrated Sara [name] provided insight into the subject matter, was respectful of peers, and was present throughout the entire session.  Therapeutic Modalities:   Cognitive Behavioral Therapy Solution-Focused Therapy  Ane Payment,  LCSW 10/14/2022  1:26 PM

## 2022-10-14 NOTE — Group Note (Signed)
Recreation Therapy Group Note   Group Topic:Animal Assisted Therapy   Group Date: 10/14/2022 End Time: 1035 Facilitators: Leldon Steege, Benito Mccreedy, LRT   Animal-Assisted Activity (AAA) Program Checklist/Progress Note Patient Eligibility Criteria Checklist & Daily Group note for Rec Tx Intervention   AAA/T Program Assumption of Risk Form signed by Patient/ or Parent Legal Guardian NO   Group Description: Patients provided opportunity to interact with trained and credentialed [employer] Therapy dog and the community volunteer/dog handler.    Affect/Mood: N/A   Participation Level: Did not attend    Clinical Observations/Individualized Feedback: Pt unable to participate in AAA programming, pt in process of admission to unit.   Benito Mccreedy Robinette Esters, LRT, CTRS 10/14/2022 12:26 PM

## 2022-10-14 NOTE — Progress Notes (Incomplete)
Latest Reference Range & Units 10/14/22 22:08  Glucose-Capillary 70 - 99 mg/dL 161 (H)  (H): Data is abnormally high  Pt is asymptomatic. Notified Delora Fuel, NP. One time order for 7 units of Insulin aspart was placed. Advised by on-call NP to not give bedtime insulin aspart to pt.  Pt was given 7 units of Insulin.

## 2022-10-14 NOTE — Progress Notes (Signed)
Per RN patient CBG was 443. New order for insulin one time dosage was order 7 units of Aspart (novoLog)

## 2022-10-14 NOTE — BHH Suicide Risk Assessment (Signed)
[facility] Admission Suicide Risk Assessment   Nursing information obtained from:  Patient Demographic factors:  Low socioeconomic status Current Mental Status:  Suicidal ideation indicated by patient Loss Factors:  NA Historical Factors:  Prior suicide attempts Risk Reduction Factors:  Sense of responsibility to family  Total Time spent with patient: 1 hour Principal Problem: Major depressive disorder, recurrent severe without psychotic features (HCC) Diagnosis:  Principal Problem:   Major depressive disorder, recurrent severe without psychotic features (HCC) Active Problems:   Moderate cocaine use disorder (HCC)   Cannabis use disorder  Subjective Data: Sara [name] is a 47 y.o., female with a past psychiatric history significant for depression and substance use who presents to the [facility] from [facility] under IVC for evaluation and management of worsening depression and SI.  According to outside records, the patient presented to [facility] emergency room reporting worsening depression, SI with plan to overdose.  Continued Clinical Symptoms:    The "Alcohol Use Disorders Identification Test", Guidelines for Use in Primary Care, Second Edition.  World Science writer [facility]). Score between 0-7:  no or low risk or alcohol related problems. Score between 8-15:  moderate risk of alcohol related problems. Score between 16-19:  high risk of alcohol related problems. Score 20 or above:  warrants further diagnostic evaluation for alcohol dependence and treatment.   CLINICAL FACTORS:   Depression:   Anhedonia Hopelessness Impulsivity Insomnia Severe Alcohol/Substance Abuse/Dependencies   Musculoskeletal: Strength & Muscle Tone: within normal limits Gait & Station: normal Patient leans: N/A  Psychiatric Specialty Exam:  Presentation  General Appearance:  Appropriate for Environment; Casual; Fairly Groomed  Eye Contact: Good  Speech: Normal Rate;  Clear and Coherent  Speech Volume: Normal  Handedness: Right   Mood and Affect  Mood: Euthymic  Affect: Appropriate; Congruent; Full Range   Thought Process  Thought Processes: Linear  Descriptions of Associations:Intact  Orientation:Full (Time, Place and Person)  Thought Content:Logical  History of Schizophrenia/Schizoaffective disorder:No data recorded Duration of Psychotic Symptoms:No data recorded Hallucinations:No data recorded Ideas of Reference:None  Suicidal Thoughts:No data recorded Homicidal Thoughts:No data recorded  Sensorium  Memory: Immediate Good; Recent Good; Remote Good  Judgment: Fair  Insight: Fair   Art therapist  Concentration: Fair  Attention Span: Fair  Recall: Good  Fund of Knowledge: Good  Language: Good   Psychomotor Activity  Psychomotor Activity:No data recorded  Assets  Assets: Communication Skills; Desire for Improvement   Sleep  Sleep:No data recorded   Physical Exam: Physical Exam ROS Height 5\' 4"  (1.626 m), weight 62 kg. Body mass index is 23.45 kg/m.   COGNITIVE FEATURES THAT CONTRIBUTE TO RISK:  Polarized thinking    SUICIDE RISK:   Severe:  Frequent, intense, and enduring suicidal ideation, specific plan, no subjective intent, but some objective markers of intent (i.e., choice of lethal method), the method is accessible, some limited preparatory behavior, evidence of impaired self-control, severe dysphoria/symptomatology, multiple risk factors present, and few if any protective factors, particularly a lack of social support.  PLAN OF CARE:  Safety and Monitoring:             -- Involuntary admission to inpatient psychiatric unit for safety, stabilization and treatment             -- Daily contact with patient to assess and evaluate symptoms and progress in treatment             -- Patient's case to be discussed in multi-disciplinary team meeting             --  Observation Level : q15  minute checks             -- Vital signs:  q12 hours             -- Precautions: suicide, elopement, and assault   2. Medications:              Restarted Prozac 20 mg daily for management of depression and anxiety             Restart Abilify 5 mg daily for mood stabilization and depression, titrate to 10 mg daily in the next few days if well-tolerated             Restarted gabapentin 600 mg 3 times daily for neuropathic pain, home medication             Start pantoprazole 40 mg daily for GERD, patient was on omeprazole 20 mg daily home medication             Metformin 850 mg twice daily for diabetes, home medication             Restart Percocet 5/325 3 times daily as needed for chronic pain, patient was on 10/325 3 times daily as needed for pain as home medication   Monitor blood pressure and consider starting hydrochlorothiazide 25 mg daily if elevated blood pressure noted, please note at emergency room patient was noted to have what elevated blood pressure reading required hydrochlorothiazide treatment, will follow.   --  The risks/benefits/side-effects/alternatives to this medication were discussed in detail with the patient and time was given for questions. The patient consents to medication trial.                -- Metabolic profile and EKG monitoring obtained while on an atypical antipsychotic (BMI: Lipid Panel: HbgA1c: QTc:)                              3. Labs Reviewed: Lab reviewed from paper chart from [facility] including UDS negative, CBC no abnormalities noted, CMP no significant abnormalities noted        Lab ordered: EKG, hemoglobin A1c, lipid panel   4. Tobacco Use Disorder             -- Patient refuses NicoDerm patch, was started on nicotine gum per her request             -- Smoking cessation encouraged   5. Group and Therapy: -- Encouraged patient to participate in unit milieu and in scheduled group therapies              --Substance Use counseling: Patient  was counseled regarding need to abstain completely from marijuana and cocaine after discharge.   6. Discharge Planning:              -- Social work and case management to assist with discharge planning and identification of hospital follow-up needs prior to discharge             -- Estimated LOS: 5-7 days             -- Discharge Concerns: Need to establish a safety plan; Medication compliance and effectiveness             -- Discharge Goals: Return home with outpatient referrals for mental health follow-up including medication management/psychotherapy  I certify that inpatient services furnished can reasonably be expected to improve the patient's condition.  Anylah Scheib Abbott Pao, MD 10/14/2022, 2:36 PM

## 2022-10-15 ENCOUNTER — Encounter (HOSPITAL_COMMUNITY): Payer: Self-pay

## 2022-10-15 DIAGNOSIS — F332 Major depressive disorder, recurrent severe without psychotic features: Secondary | ICD-10-CM | POA: Diagnosis not present

## 2022-10-15 LAB — GLUCOSE, CAPILLARY
Glucose-Capillary: 184 mg/dL — ABNORMAL HIGH (ref 70–99)
Glucose-Capillary: 192 mg/dL — ABNORMAL HIGH (ref 70–99)
Glucose-Capillary: 322 mg/dL — ABNORMAL HIGH (ref 70–99)
Glucose-Capillary: 385 mg/dL — ABNORMAL HIGH (ref 70–99)
Glucose-Capillary: 248 mg/dL — ABNORMAL HIGH (ref 70–99)
Glucose-Capillary: 283 mg/dL — ABNORMAL HIGH (ref 70–99)

## 2022-10-15 MED ORDER — INSULIN ASPART 100 UNIT/ML IJ SOLN
4.0000 [IU] | Freq: Three times a day (TID) | INTRAMUSCULAR | Status: DC
  Administered 2022-10-15 – 2022-10-16 (×3): 4 [IU] via SUBCUTANEOUS

## 2022-10-15 MED ORDER — LINACLOTIDE 145 MCG PO CAPS
145.0000 ug | ORAL_CAPSULE | Freq: Every day | ORAL | Status: DC
  Administered 2022-10-16 – 2022-10-21 (×4): 145 ug via ORAL
  Filled 2022-10-15 (×9): qty 1

## 2022-10-15 MED ORDER — INSULIN ASPART 100 UNIT/ML IJ SOLN: 4.0000 [IU] | Freq: Once | INTRAMUSCULAR | Status: DC

## 2022-10-15 MED ORDER — ENSURE ENLIVE PO LIQD
237.0000 mL | Freq: Two times a day (BID) | ORAL | Status: DC
  Administered 2022-10-15 – 2022-10-17 (×4): 237 mL via ORAL
  Filled 2022-10-15 (×10): qty 237

## 2022-10-15 MED ORDER — INSULIN ASPART 100 UNIT/ML IJ SOLN
4.0000 [IU] | Freq: Once | INTRAMUSCULAR | Status: DC
  Administered 2022-10-15: 4 [IU] via SUBCUTANEOUS

## 2022-10-15 MED ORDER — INSULIN GLARGINE-YFGN 100 UNIT/ML ~~LOC~~ SOLN
12.0000 [IU] | Freq: Every day | SUBCUTANEOUS | Status: DC
  Administered 2022-10-15: 12 [IU] via SUBCUTANEOUS

## 2022-10-15 NOTE — Plan of Care (Signed)
  Problem: Education: Goal: Knowledge of [facility] Education information/materials will improve Outcome: Progressing Goal: Emotional status will improve Outcome: Progressing Goal: Mental status will improve Outcome: Progressing Goal: Verbalization of understanding the information provided will improve Outcome: Progressing   Problem: Activity: Goal: Interest or engagement in activities will improve Outcome: Progressing Goal: Sleeping patterns will improve Outcome: Progressing   Problem: Coping: Goal: Ability to verbalize frustrations and anger appropriately will improve Outcome: Progressing Goal: Ability to demonstrate self-control will improve Outcome: Progressing   Problem: Health Behavior/Discharge Planning: Goal: Identification of resources available to assist in meeting health care needs will improve Outcome: Progressing Goal: Compliance with treatment plan for underlying cause of condition will improve Outcome: Progressing   Problem: Physical Regulation: Goal: Ability to maintain clinical measurements within normal limits will improve Outcome: Progressing

## 2022-10-15 NOTE — Group Note (Signed)
Recreation Therapy Group Note   Group Topic:Leisure Education  Group Date: 10/15/2022 Start Time: 0935 End Time: 1025 Facilitators: Shraga Custard-McCall, LRT,CTRS Location: 300 Hall Dayroom   Group Topic: Leisure Education   Goal Area(s) Addresses:  Patient will successfully identify positive leisure and recreation activities.  Patient will acknowledge benefits of participation in healthy leisure activities post discharge.  Patient will actively work with peers toward a shared goal.   Intervention: Group Game    Group Description: Music Trivia. LRT asked patients music questions about oldies but goodies and 90s, 2000s hip hop and r&b. In one group, patients worked together to figure out the next lyric to the songs presented in group. The activity was also used to show patients that leisure didn't have to be an expensive activity and can be enjoyed as a group as well as individually.    Education:  Teacher, English as a foreign language, Leisure as Merchant navy officer, Programmer, applications, Building control surveyor   Education Outcome: Acknowledges education/In group clarification offered/Needs additional education   Affect/Mood: N/A   Participation Level: Did not attend    Clinical Observations/Individualized Feedback:     Plan: Continue to engage patient in RT group sessions 2-3x/week.   Oryon Gary-McCall, LRT,CTRS 10/15/2022 12:27 PM

## 2022-10-15 NOTE — BH IP Treatment Plan (Signed)
Interdisciplinary Treatment and Diagnostic Plan Initial  10/15/2022 Time of Session: 1055 Sara [name] MRN: [id number]  Principal Diagnosis: Major depressive disorder, recurrent severe without psychotic features [facility])  Secondary Diagnoses: Principal Problem:   Major depressive disorder, recurrent severe without psychotic features (HCC) Active Problems:   Moderate cocaine use disorder (HCC)   Cannabis use disorder   Current Medications:  Current Facility-Administered Medications  Medication Dose Route Frequency Provider Last Rate Last Admin   alum & mag hydroxide-simeth (MAALOX/MYLANTA) 200-200-20 MG/5ML suspension 30 mL  30 mL Oral Q4H PRN Ardis Hughs, NP       [START ON 10/16/2022] ARIPiprazole (ABILIFY) tablet 10 mg  10 mg Oral Daily Abbott Pao, Nadir, MD       FLUoxetine (PROZAC) capsule 20 mg  20 mg Oral Daily Abbott Pao, Nadir, MD   20 mg at 10/15/22 0809   gabapentin (NEURONTIN) capsule 600 mg  600 mg Oral TID Sarita Bottom, MD   600 mg at 10/15/22 0809   hydrOXYzine (ATARAX) tablet 25 mg  25 mg Oral Q6H PRN Abbott Pao, Nadir, MD   25 mg at 10/14/22 1306   insulin aspart (novoLOG) injection 0-15 Units  0-15 Units Subcutaneous TID WC Sindy Guadeloupe, NP   3 Units at 10/15/22 8295   insulin aspart (novoLOG) injection 0-5 Units  0-5 Units Subcutaneous QHS Sindy Guadeloupe, NP       Melene Muller ON 10/16/2022] linaclotide (LINZESS) capsule 145 mcg  145 mcg Oral QAC breakfast Abbott Pao, Nadir, MD       magnesium hydroxide (MILK OF MAGNESIA) suspension 30 mL  30 mL Oral Daily PRN Ardis Hughs, NP       nicotine polacrilex (NICORETTE) gum 2 mg  2 mg Oral PRN Abbott Pao, Nadir, MD       OLANZapine zydis (ZYPREXA) disintegrating tablet 5 mg  5 mg Oral Q8H PRN Abbott Pao, Nadir, MD       oxyCODONE-acetaminophen (PERCOCET/ROXICET) 5-325 MG per tablet 1 tablet  1 tablet Oral Q8H PRN Abbott Pao, Nadir, MD   1 tablet at 10/15/22 0830   pantoprazole (PROTONIX) EC tablet 40 mg  40 mg Oral Daily Attiah, Nadir, MD   40 mg  at 10/15/22 0809   traZODone (DESYREL) tablet 50 mg  50 mg Oral QHS PRN Ardis Hughs, NP   50 mg at 10/14/22 2106   PTA Medications: Medications Prior to Admission  Medication Sig Dispense Refill Last Dose   albuterol (VENTOLIN HFA) 108 (90 Base) MCG/ACT inhaler Inhale 1-2 puffs into the lungs every 6 (six) hours as needed for wheezing or shortness of breath.      diphenhydrAMINE (BENADRYL) 25 mg capsule Take 25 mg by mouth daily as needed for itching or allergies.      omeprazole (PRILOSEC) 20 MG capsule Take 40 mg by mouth daily.      oxyCODONE-acetaminophen (PERCOCET) 10-325 MG tablet Take 1 tablet by mouth 3 (three) times daily.   10/07/2022   ARIPiprazole (ABILIFY) 10 MG tablet Take 1 tablet (10 mg total) by mouth at bedtime. 30 tablet 0    FLUoxetine (PROZAC) 20 MG capsule Take 1 capsule (20 mg total) by mouth daily. 30 capsule 0    gabapentin (NEURONTIN) 300 MG capsule Take 2 capsules (600 mg total) by mouth 3 (three) times daily. 180 capsule 0    nicotine polacrilex (NICORETTE) 2 MG gum Take 1 each (2 mg total) by mouth as needed for smoking cessation. 100 tablet 0    psyllium (HYDROCIL/METAMUCIL) 95 % PACK Take 1  packet by mouth daily. 240 each     traZODone (DESYREL) 50 MG tablet Take 1 tablet (50 mg total) by mouth at bedtime as needed for sleep. (Patient taking differently: Take 100 mg by mouth at bedtime as needed for sleep.) 30 tablet 0     Patient Stressors:    Patient Strengths:    Treatment Modalities: Medication Management, Group therapy, Case management,  1 to 1 session with clinician, Psychoeducation, Recreational therapy.   Physician Treatment Plan for Primary Diagnosis: Major depressive disorder, recurrent severe without psychotic features (HCC) Long Term Goal(s): Improvement in symptoms so as ready for discharge   Short Term Goals: Ability to identify changes in lifestyle to reduce recurrence of condition will improve Ability to verbalize feelings will  improve Ability to disclose and discuss suicidal ideas Ability to demonstrate self-control will improve Ability to identify and develop effective coping behaviors will improve  Medication Management: Evaluate patient's response, side effects, and tolerance of medication regimen.  Therapeutic Interventions: 1 to 1 sessions, Unit Group sessions and Medication administration.  Evaluation of Outcomes: Progressing  Physician Treatment Plan for Secondary Diagnosis: Principal Problem:   Major depressive disorder, recurrent severe without psychotic features (HCC) Active Problems:   Moderate cocaine use disorder (HCC)   Cannabis use disorder  Long Term Goal(s): Improvement in symptoms so as ready for discharge   Short Term Goals: Ability to identify changes in lifestyle to reduce recurrence of condition will improve Ability to verbalize feelings will improve Ability to disclose and discuss suicidal ideas Ability to demonstrate self-control will improve Ability to identify and develop effective coping behaviors will improve     Medication Management: Evaluate patient's response, side effects, and tolerance of medication regimen.  Therapeutic Interventions: 1 to 1 sessions, Unit Group sessions and Medication administration.  Evaluation of Outcomes: Progressing   RN Treatment Plan for Primary Diagnosis: Major depressive disorder, recurrent severe without psychotic features (HCC) Long Term Goal(s): Knowledge of disease and therapeutic regimen to maintain health will improve  Short Term Goals: Ability to remain free from injury will improve, Ability to verbalize frustration and anger appropriately will improve, Ability to demonstrate self-control, Ability to participate in decision making will improve, Ability to verbalize feelings will improve, Ability to disclose and discuss suicidal ideas, Ability to identify and develop effective coping behaviors will improve, and Compliance with prescribed  medications will improve  Medication Management: RN will administer medications as ordered by provider, will assess and evaluate patient's response and provide education to patient for prescribed medication. RN will report any adverse and/or side effects to prescribing provider.  Therapeutic Interventions: 1 on 1 counseling sessions, Psychoeducation, Medication administration, Evaluate responses to treatment, Monitor vital signs and CBGs as ordered, Perform/monitor CIWA, COWS, AIMS and Fall Risk screenings as ordered, Perform wound care treatments as ordered.  Evaluation of Outcomes: Progressing   LCSW Treatment Plan for Primary Diagnosis: Major depressive disorder, recurrent severe without psychotic features (HCC) Long Term Goal(s): Safe transition to appropriate next level of care at discharge, Engage patient in therapeutic group addressing interpersonal concerns.  Short Term Goals: Engage patient in aftercare planning with referrals and resources, Increase social support, Increase ability to appropriately verbalize feelings, Increase emotional regulation, Facilitate acceptance of mental health diagnosis and concerns, Facilitate patient progression through stages of change regarding substance use diagnoses and concerns, Identify triggers associated with mental health/substance abuse issues, and Increase skills for wellness and recovery  Therapeutic Interventions: Assess for all discharge needs, 1 to 1 time with Child psychotherapist,  Explore available resources and support systems, Assess for adequacy in community support network, Educate family and significant other(s) on suicide prevention, Complete Psychosocial Assessment, Interpersonal group therapy.  Evaluation of Outcomes: Progressing   Progress in Treatment: Attending groups: Yes. Participating in groups: Yes. Taking medication as prescribed: Yes. Toleration medication: Yes. Family/Significant other contact made: No Consents pending Patient  understands diagnosis: Yes. Discussing patient identified problems/goals with staff: Yes. Medical problems stabilized or resolved: Yes. Denies suicidal/homicidal ideation: Yes. Issues/concerns per patient self-inventory: Yes. Other: N/A  New problem(s) identified: No, Describe:  None Reported  New Short Term/Long Term Goal(s): medication stabilization, elimination of SI thoughts, development of comprehensive mental wellness plan.   Patient Goals:  Medication Stabilization  Discharge Plan or Barriers: Patient recently admitted. CSW will continue to follow and assess for appropriate referrals and possible discharge planning.   Reason for Continuation of Hospitalization: Anxiety Depression Medical Issues Medication stabilization Suicidal ideation Withdrawal symptoms  Estimated Length of Stay:5-7 Days  Last 3 [place] Suicide Severity Risk Score: Flowsheet Row [facility] Ad[facility] (Discharged) [facility] Admission (Discharged[facility]  C-SSRS RISK CATEGORY Moderate Risk High Risk Moderate Risk       Last PHQ 2/9 Scores:     No data to display        detox, medication management for mood stabilization; elimination of SI thoughts; development of comprehensive mental wellness/sobriety plan     Scribe for Treatment Team: Ane Payment, LCSW 10/15/2022 11:54 AM

## 2022-10-15 NOTE — BHH Counselor (Signed)
  10/15/2022  1:41 PM   Oaklynn D Whilden  Type of note: Assessment update   During assessment Pt shared with CSW that she will purposely not take medication for her diabetes to either go to the hospital or stay at the hospital.   Signed:  Marya Landry MSW, [facility] 10/15/2022  1:41 PM

## 2022-10-15 NOTE — Inpatient Diabetes Management (Signed)
Inpatient Diabetes Program Recommendations  AACE/ADA: New Consensus Statement on Inpatient Glycemic Control (2015)  Target Ranges:  Prepandial:   less than 140 mg/dL      Peak postprandial:   less than 180 mg/dL (1-2 hours)      Critically ill patients:  140 - 180 mg/dL   Lab Results  Component Value Date   GLUCAP 385 (H) 10/15/2022   HGBA1C 12.7 (H) 02/01/2022    Review of Glycemic Control  Diabetes history: DM2 Outpatient Diabetes medications: None at present (has been prescribed both Semglee and 70/30 insulin in the past) Current orders for Inpatient glycemic control: Novolog 0-15 TID with meals and 0-5 HS  HgbA1C - 12.7%  Previously on 70/30   Inpatient Diabetes Program Recommendations:    Consider Semglee 12 units at bedtime  Consider Novolog 4 units TID with meals if eating > 50%  Continue Novolog 0-15 TID with meals and 0-5 HS  Need updated HgbA1C.  Pt needs f/u with PCP for diabetes management.   Needs insulin prescription, glucose meter and supplies at discharge.  Will follow glucose trends while inpatient.  Thank you. Ailene Ards, RD, LDN, CDCES Inpatient Diabetes Coordinator [phone]

## 2022-10-15 NOTE — Progress Notes (Signed)
After lunch (1315), patient reported she felt dizzy, prior to lunch her blood glucose was 322, 11U given of Novolog per MD order, patient reports at lunch she ate her meal, an orange, and a soda. After lunch she reported feeling dizzy, RN rechecked her blood glucose & reading was 385,BP 126/85 and pulse 113. Dr. Abbott Pao made aware with no new orders at this time. Diabetes coordinator has been consulted for review of medications.

## 2022-10-15 NOTE — Progress Notes (Signed)
[facility] MD Progress Note  10/15/2022 9:57 AM MASHAYLA DILONE  MRN:  [id number]   Reason for Admission:  Sara [name] is a 47 y.o., female with a past psychiatric history significant for depression and substance use who presents to the [facility] from [facility] under IVC for evaluation and management of worsening depression and SI.  According to outside records, the patient presented to [facility] emergency room reporting worsening depression, SI with plan to overdose.The patient is currently on Hospital Day 1.   Chart Review from last 24 hours:  The patient's chart was reviewed and nursing notes were reviewed. The patient's case was discussed in multidisciplinary team meeting. Per [facility] patient used as needed Atarax yesterday at 1 PM and as needed Percocet yesterday at 9 PM and this morning at 8:30 AM, also received trazodone last night 9/17, vitals are stable including blood pressure within normal limits, compliant with scheduled medications  Information Obtained Today During Patient Interview: The patient was seen and evaluated on the unit. On assessment today the patient reports still feeling depressed and anxious but is "starting to feel better" mainly related to being in a safe environment she continues to report feeling hopelessness regarding her situation with passive SI wishing self dead she denies current active SI intention or plan while in the hospital and able to contract for safety only in the hospital but not if she leaves, denies HI or AVH, reports improving irritability and mood swings.  Denies paranoia or other delusions, reports poor sleep last night but it was related to elevated blood sugar and her getting insulin during the night, she confirms she was not on metformin at home and she has history of trying metformin that caused severe gastric upset and diarrhea, she is unable to recall her home regimen of diabetes which unfortunately she was not compliant with it  secondary to lack of compliance with outpatient follow-up, discussed with patient consulting diabetes management team, will follow.  Patient remains interested in contacting halfway houses given her main stress related to homelessness and having no place to go, will follow.  She denies side effect to current medication regimen, does not appear sleepy, does not display any sign consistent with TD or EPS.   Sleep  Sleep: Staff reported patient slept 5 hours, patient reports poor sleep  Principal Problem: Major depressive disorder, recurrent severe without psychotic features (HCC) Diagnosis: Principal Problem:   Major depressive disorder, recurrent severe without psychotic features (HCC) Active Problems:   Moderate cocaine use disorder (HCC)   Cannabis use disorder    Past Psychiatric History:  Prior Psychiatric diagnoses: Reports history of diagnosed bipolar disorder but unable to specify symptoms consistent with mania or hypomania except in the presence of cocaine use Past Psychiatric Hospitalizations: 2 previous hospitalizations to this facility May 2024 for 4 days in June 2024 for 7 days, discharge medications in June included Prozac, Abilify, gabapentin, Percocet, trazodone   History of self mutilation: Denies Past suicide attempts: 2 previous suicide attempts last time years ago by overdose Past history of HI, violent or aggressive behavior: History of getting in fights related to irritability   Past Psychiatric medications trials: History of good response to Prozac and Abilify History of ECT/TMS: Denies   Outpatient psychiatric Follow up: When discharged from [facility] in June.  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 pylori infection    High risk medication use    Hypercholesterolemia    Hyperlipidemia, mixed    Hypertension    Other mixed anxiety disorders    Pollen allergies    Sickle cell trait (HCC)    Sleep apnea    Vitamin D deficiency     Past Surgical History:  Procedure Laterality Date   ABDOMINAL HYSTERECTOMY     APPENDECTOMY     COLONOSCOPY  08-16-2008   Dr. Vallarie Mare   ESOPHAGOGASTRODUODENOSCOPY  08-16-2008   Dr. Rayfield Citizen    Family History:  Family History  Problem Relation Age of Onset   Clotting disorder Mother    Crohn's disease Mother    Heart disease Mother    Colon cancer Father    Clotting disorder Brother    Diabetes Brother    Diabetes Maternal Aunt    Liver cancer Maternal Uncle    Prostate cancer Maternal Uncle    Colon cancer Maternal Uncle    Diabetes Paternal Aunt    Esophageal cancer Paternal Uncle    Family Psychiatric  History:  Psychiatric illness: Reports mother and uncle had bipolar disorder Suicide: Reports mother had history of suicide attempts Substance Abuse: Unknown  Social History:  Living situation: Homeless for the past 4 years Social support: Poor social support Marital Status: Was married once, divorced since age 50 years old Children: 2 sons ages 62 and 72 lives in [place] and [place], patient reports being estranged from the Education: 10th grade Employment: Reports last job 2 to 3 months ago 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 Legal history: History of jail time related to fights years ago, history of prison time related to driving without license, years ago Trauma: Denies Access to guns: Denies  Current Medications: Current Facility-Administered Medications  Medication Dose Route Frequency Provider Last Rate Last Admin   alum & mag hydroxide-simeth (MAALOX/MYLANTA) 200-200-20 MG/5ML suspension 30 mL  30 mL Oral Q4H PRN Ardis Hughs, NP       [START ON 10/16/2022] ARIPiprazole (ABILIFY) tablet 10 mg  10 mg Oral Daily  Nawal Burling, MD       FLUoxetine (PROZAC) capsule 20 mg  20 mg Oral Daily Adriane Guglielmo, MD   20 mg at 10/15/22 0809   gabapentin (NEURONTIN) capsule 600 mg  600 mg Oral TID Sarita Bottom, MD   600 mg at 10/15/22 0809   hydrOXYzine (ATARAX) tablet 25 mg  25 mg Oral Q6H PRN Abbott Pao, Sarahgrace Broman, MD   25 mg at 10/14/22 1306   insulin aspart (novoLOG) injection 0-15 Units  0-15 Units Subcutaneous TID WC Sindy Guadeloupe, NP   3 Units at 10/15/22 4403   insulin aspart (novoLOG) injection 0-5 Units  0-5 Units Subcutaneous QHS Sindy Guadeloupe, NP       Melene Muller ON 10/16/2022] linaclotide (LINZESS) capsule 145 mcg  145 mcg Oral QAC breakfast Janny Crute, MD       magnesium hydroxide (MILK OF MAGNESIA) suspension 30 mL  30 mL Oral Daily PRN Ardis Hughs, NP       nicotine polacrilex (NICORETTE) gum 2 mg  2 mg Oral PRN Marivel Mcclarty, MD       OLANZapine zydis (ZYPREXA) disintegrating tablet 5 mg  5 mg Oral Q8H PRN Abbott Pao, Dafne Nield, MD       oxyCODONE-acetaminophen (PERCOCET/ROXICET) 5-325 MG  per tablet 1 tablet  1 tablet Oral Q8H PRN Sarita Bottom, MD   1 tablet at 10/15/22 0830   pantoprazole (PROTONIX) EC tablet 40 mg  40 mg Oral Daily Camara Rosander, MD   40 mg at 10/15/22 0809   traZODone (DESYREL) tablet 50 mg  50 mg Oral QHS PRN Ardis Hughs, NP   50 mg at 10/14/22 2106    Lab Results:  Results for orders placed or performed during the hospital encounter of 10/14/22 (from the past 48 hour(s))  Glucose, capillary     Status: Abnormal   Collection Time: 10/14/22 10:08 PM  Result Value Ref Range   Glucose-Capillary 443 (H) 70 - 99 mg/dL    Comment: Glucose reference range applies only to samples taken after fasting for at least 8 hours.  Glucose, capillary     Status: Abnormal   Collection Time: 10/14/22 11:53 PM  Result Value Ref Range   Glucose-Capillary 337 (H) 70 - 99 mg/dL    Comment: Glucose reference range applies only to samples taken after fasting for at least 8 hours.  Glucose, capillary      Status: Abnormal   Collection Time: 10/15/22  2:00 AM  Result Value Ref Range   Glucose-Capillary 184 (H) 70 - 99 mg/dL    Comment: Glucose reference range applies only to samples taken after fasting for at least 8 hours.  Glucose, capillary     Status: Abnormal   Collection Time: 10/15/22  5:33 AM  Result Value Ref Range   Glucose-Capillary 192 (H) 70 - 99 mg/dL    Comment: Glucose reference range applies only to samples taken after fasting for at least 8 hours.  Lipid panel     Status: Abnormal   Collection Time: 10/15/22  6:54 AM  Result Value Ref Range   Cholesterol 274 (H) 0 - 200 mg/dL   Triglycerides 86 <694 mg/dL   HDL 63 >85 mg/dL   Total CHOL/HDL Ratio 4.3 RATIO   VLDL 17 0 - 40 mg/dL   LDL Cholesterol 462 (H) 0 - 99 mg/dL    Comment:        Total Cholesterol/HDL:CHD Risk Coronary Heart Disease Risk Table                     Men   Women  1/2 Average Risk   3.4   3.3  Average Risk       5.0   4.4  2 X Average Risk   9.6   7.1  3 X Average Risk  23.4   11.0        Use the calculated Patient Ratio above and the CHD Risk Table to determine the patient's CHD Risk.        ATP III CLASSIFICATION (LDL):  <100     mg/dL   Optimal  703-500  mg/dL   Near or Above                    Optimal  130-159  mg/dL   Borderline  938-182  mg/dL   High  >993     mg/dL   Very High Performed at [facility], [address], [place], [place] [address]     Blood Alcohol level:  Lab Results  Component Value Date   ETH <10 06/17/2022   [facility]  12/11/2009    <5        LOWEST DETECTABLE LIMIT FOR SERUM ALCOHOL IS 5 mg/dL  FOR MEDICAL PURPOSES ONLY    Metabolic Disorder Labs: Lab Results  Component Value Date   HGBA1C 12.7 (H) 02/01/2022   MPG 318 02/01/2022   No results found for: "PROLACTIN" Lab Results  Component Value Date   CHOL 274 (H) 10/15/2022   TRIG 86 10/15/2022   HDL 63 10/15/2022   CHOLHDL 4.3 10/15/2022   VLDL 17 10/15/2022   LDLCALC 194  (H) 10/15/2022   LDLCALC 131 (H) 02/01/2022    Physical Findings: AIMS: Facial and Oral Movements Muscles of Facial Expression: None, normal Lips and Perioral Area: None, normal Jaw: None, normal Tongue: None, normal,Extremity Movements Upper (arms, wrists, hands, fingers): None, normal Lower (legs, knees, ankles, toes): None, normal, Trunk Movements Neck, shoulders, hips: None, normal, Overall Severity Severity of abnormal movements (highest score from questions above): None, normal Incapacitation due to abnormal movements: None, normal Patient's awareness of abnormal movements (rate only patient's report): No Awareness, Dental Status Current problems with teeth and/or dentures?: No Does patient usually wear dentures?: No  CIWA:    COWS:     Musculoskeletal: Strength & Muscle Tone: within normal limits Gait & Station: normal Patient leans: N/A  Psychiatric Specialty Exam:  General Appearance: Appears older than stated age, dressed casually, with an average hygiene   Behavior: Cooperative and calm in general   Psychomotor Activity: No psychomotor agitation or retardation noted   Eye Contact: Limited Speech: Normal   Mood: Dysphoric Affect: Restricted sad affect   Thought Process: Linear and goal-directed Descriptions of Associations: Concrete Thought Content: Hallucinations: Denies AH, VH, does not appear responding to stimuli Delusions: No paranoia  Suicidal Thoughts: Admits to passive SI wishing self dead and active SI with plan to overdose, able to contract for safety in the hospital but not if outside of the hospital Homicidal Thoughts: Denies HI intention or plan   Alertness/Orientation: Alert and oriented   Insight: Limited Judgment: Limited   Memory: intact   Executive Functions  Concentration: Fair Attention Span: Fair Recall: [employer] of Knowledge: Fair   Assets  Assets: Manufacturing systems engineer; Desire for Improvement    Physical  Exam: Physical Exam Vitals and nursing note reviewed.    Review of Systems  Gastrointestinal:  Positive for constipation.  All other systems reviewed and are negative.  Blood pressure 111/83, temperature (!) 97.3 F (36.3 C), temperature source Oral, resp. rate 16, height 5\' 4"  (1.626 m), weight 62 kg, SpO2 98%. Body mass index is 23.45 kg/m.   Treatment Plan Summary: Daily contact with patient to assess and evaluate symptoms and progress in treatment and Medication management  ASSESSMENT:  Diagnoses / Active Problems: Principal Problem: Major depressive disorder, recurrent severe without psychotic features (HCC) Diagnosis: Principal Problem:   Major depressive disorder, recurrent severe without psychotic features (HCC) Active Problems:   Moderate cocaine use disorder (HCC)   Cannabis use disorder   PLAN:  Safety and Monitoring:             -- Involuntary admission to inpatient psychiatric unit for safety, stabilization and treatment             -- Daily contact with patient to assess and evaluate symptoms and progress in treatment             -- Patient's case to be discussed in multi-disciplinary team meeting             -- Observation Level : q15 minute checks             --  Vital signs:  q12 hours             -- Precautions: suicide, elopement, and assault   2. Medications:             Continue Prozac 20 mg daily for management of depression and anxiety Continue Abilify 5 mg daily for mood stabilization and depression, titrate to 10 mg daily 9/19             continue gabapentin 600 mg 3 times daily for neuropathic pain, home medication             Continue pantoprazole 40 mg daily for GERD, patient was on omeprazole 20 mg daily home medication             Metformin twice daily was listed as home medication at [facility] emergency room note but patient denies she was receiving metformin, history of metformin treatment that led to stomach upset and diarrhea, patient does  not recall her home regimen for diabetes and has not been compliant with, consulted diabetes management team for further management.             Continue Percocet 5/325 3 times daily as needed for chronic pain, patient was on 10/325 3 times daily as needed for pain as home medication   Continue to monitor blood pressure and consider starting hydrochlorothiazide 25 mg daily if elevated blood pressure noted, please note at emergency room patient was noted to have what elevated blood pressure reading required hydrochlorothiazide treatment, will follow.  Start Linzess 145 mcg daily for irritable bowel syndrome, patient reports was on it until a month ago for IBS but stopped after ran out secondary to lack of compliance with outpatient follow-up  Continue trazodone 50 mg at bedtime as needed for sleep  Continue Atarax 25 mg every 6 hours as needed for anxiety   --  The risks/benefits/side-effects/alternatives to this medication were discussed in detail with the patient and time was given for questions. The patient consents to medication trial.                -- Metabolic profile and EKG monitoring obtained while on an atypical antipsychotic (BMI: Lipid Panel: HbgA1c: QTc:)    3. Labs Reviewed: Lab reviewed from paper chart from [facility] including UDS negative, CBC no abnormalities noted, CMP no significant abnormalities noted EKG 9/17 QTc 453, lipid data shows elevated cholesterol 274 and elevated LDL 194, patient was counseled to check with primary care provider after discharge for consideration of a statin treatment      Lab ordered: hemoglobin A1c   4. Tobacco Use Disorder             -- Patient refuses NicoDerm patch, was started on nicotine gum per her request             -- Smoking cessation encouraged   5. Group and Therapy: -- Encouraged patient to participate in unit milieu and in scheduled group therapies              --Substance Use counseling: Patient was counseled regarding  need to abstain completely from marijuana and cocaine after discharge.   6. Discharge Planning:              -- Social work and case management to assist with discharge planning and identification of hospital follow-up needs prior to discharge             -- Estimated LOS: 5-7 days             --  Discharge Concerns: Need to establish a safety plan; Medication compliance and effectiveness             -- Discharge Goals: Return home with outpatient referrals for mental health follow-up including medication management/psychotherapy   Patient main stress related to being homeless, ongoing substance use problem including marijuana and cocaine, patient is interested in contacting halfway houses, will follow.   The patient is agreeable with the medication plan, as above. We will monitor the patient's response to pharmacologic treatment, and adjust medications as necessary. Patient is encouraged to participate in group therapy while admitted to the psychiatric unit. We will address other chronic and acute stressors, which contributed to the patient's worsening depression and SI, in order to reduce the risk of self-harm at discharge.     Physician Treatment Plan for Primary Diagnosis: Major depressive disorder, recurrent severe without psychotic features (HCC) Long Term Goal(s): Improvement in symptoms so as ready for discharge   Short Term Goals: Ability to identify changes in lifestyle to reduce recurrence of condition will improve, Ability to verbalize feelings will improve, Ability to disclose and discuss suicidal ideas, Ability to demonstrate self-control will improve, and Ability to identify and develop effective coping behaviors will improve     Total Time Spent in Direct Patient Care:  I personally spent 35 minutes on the unit in direct patient care. The direct patient care time included face-to-face time with the patient, reviewing the patient's chart, communicating with other professionals,  and coordinating care. Greater than 50% of this time was spent in counseling or coordinating care with the patient regarding goals of hospitalization, psycho-education, and discharge planning needs.   Elisabella Hacker Abbott Pao, MD 10/15/2022, 9:57 AM

## 2022-10-15 NOTE — Progress Notes (Signed)
   10/14/22 2106  Psych Admission Type (Psych Patients Only)  Admission Status Involuntary  Psychosocial Assessment  Patient Complaints Anxiety;Depression;Worrying  Eye Contact Fair  Facial Expression Sad  Affect Depressed  Speech Logical/coherent  Interaction Assertive  Motor Activity Other (Comment) (WNL)  Appearance/Hygiene Unremarkable  Behavior Characteristics Cooperative;Appropriate to situation  Mood Depressed;Anxious  Thought Process  Coherency WDL  Content WDL  Delusions None reported or observed  Perception WDL  Hallucination None reported or observed  Judgment Impaired  Confusion None  Danger to Self  Current suicidal ideation? Denies  Self-Injurious Behavior No self-injurious ideation or behavior indicators observed or expressed   Agreement Not to Harm Self Yes  Description of Agreement verbal  Danger to Others  Danger to Others None reported or observed

## 2022-10-15 NOTE — Plan of Care (Signed)
Problem: Activity: Goal: Interest or engagement in activities will improve Outcome: Progressing   Problem: Coping: Goal: Ability to verbalize frustrations and anger appropriately will improve Outcome: Progressing   Problem: Safety: Goal: Periods of time without injury will increase Outcome: Progressing
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
## 2022-10-16 DIAGNOSIS — F332 Major depressive disorder, recurrent severe without psychotic features: Secondary | ICD-10-CM | POA: Diagnosis not present

## 2022-10-16 LAB — GLUCOSE, CAPILLARY
Glucose-Capillary: 210 mg/dL — ABNORMAL HIGH (ref 70–99)
Glucose-Capillary: 327 mg/dL — ABNORMAL HIGH (ref 70–99)
Glucose-Capillary: 333 mg/dL — ABNORMAL HIGH (ref 70–99)
Glucose-Capillary: 131 mg/dL — ABNORMAL HIGH (ref 70–99)
Glucose-Capillary: 180 mg/dL — ABNORMAL HIGH (ref 70–99)

## 2022-10-16 LAB — HEMOGLOBIN A1C
Hgb A1c MFr Bld: 11.2 % — ABNORMAL HIGH (ref 4.8–5.6)
Mean Plasma Glucose: 275 mg/dL

## 2022-10-16 MED ORDER — CLONIDINE HCL 0.1 MG PO TABS
0.1000 mg | ORAL_TABLET | Freq: Once | ORAL | Status: AC
  Administered 2022-10-16: 0.1 mg via ORAL
  Filled 2022-10-16 (×2): qty 1

## 2022-10-16 MED ORDER — INSULIN GLARGINE-YFGN 100 UNIT/ML ~~LOC~~ SOLN
16.0000 [IU] | Freq: Every day | SUBCUTANEOUS | Status: DC
  Administered 2022-10-16 – 2022-10-20 (×5): 16 [IU] via SUBCUTANEOUS

## 2022-10-16 MED ORDER — INSULIN ASPART 100 UNIT/ML IJ SOLN
6.0000 [IU] | Freq: Three times a day (TID) | INTRAMUSCULAR | Status: DC
  Administered 2022-10-16 – 2022-10-21 (×15): 6 [IU] via SUBCUTANEOUS

## 2022-10-16 NOTE — Progress Notes (Signed)
   10/15/22 2020  Psych Admission Type (Psych Patients Only)  Admission Status Involuntary  Psychosocial Assessment  Patient Complaints Depression  Eye Contact Fair  Facial Expression Flat  Affect Depressed  Speech Logical/coherent  Interaction Assertive  Motor Activity Other (Comment) (WNL)  Appearance/Hygiene Unremarkable  Behavior Characteristics Cooperative  Mood Depressed  Thought Process  Coherency WDL  Content WDL  Delusions None reported or observed  Perception WDL  Hallucination None reported or observed  Judgment Impaired  Confusion None  Danger to Self  Current suicidal ideation? Denies  Self-Injurious Behavior No self-injurious ideation or behavior indicators observed or expressed   Agreement Not to Harm Self Yes  Description of Agreement verbal  Danger to Others  Danger to Others None reported or observed

## 2022-10-16 NOTE — Group Note (Signed)
Date:  10/16/2022 Time:  10:33 AM  Group Topic/Focus:  Goals Group:   The focus of this group is to help patients establish daily goals to achieve during treatment and discuss how the patient can incorporate goal setting into their daily lives to aide in recovery.    Participation Level:  Active  Participation Quality:  Appropriate  Affect:  Appropriate  Cognitive:  Appropriate  Insight: Appropriate  Engagement in Group:  Engaged  Modes of Intervention:  Discussion  Additional Comments:     Sara [name] 10/16/2022, 10:33 AM

## 2022-10-16 NOTE — Progress Notes (Addendum)
[facility] MD Progress Note  10/16/2022 3:50 PM Sara [name]  MRN:  [id number]  Principal Problem:   Major depressive disorder, recurrent severe without psychotic features (HCC) Active Problems:   Moderate cocaine use disorder (HCC)   Cannabis use disorder  Reason for Admission:  Sara [name] is a 47 y.o., female with a past psychiatric history significant for depression and substance use who presents to the [facility] from [facility] under IVC for evaluation and management of worsening depression and SI. According to outside records, the patient presented to [facility] emergency room reporting worsening depression, SI with plan to overdose.The patient is currently on Hospital Day 2.   Yesterday the psychiatry team made the following recommendations:  Continue Prozac 20 mg daily for management of depression and anxiety Increase Abilify from 5 mg p.o. daily for mood stabilization and depression, to 10 mg daily 10/16/22             Continue gabapentin 600 mg 3 times daily for neuropathic pain, home medication Continue pantoprazole 40 mg daily for GERD, patient was on omeprazole 20 mg daily home medication Metformin twice daily was listed as home medication at [facility] ED note but patient denies she was receiving metformin, history of metformin treatment that led to stomach upset and diarrhea, patient does not recall her home regimen for diabetes and has not been compliant with, consulted diabetes management team for further management. Continue Percocet 5/325 3 times daily as needed for chronic pain, patient was on 10/325 3 times daily as needed for pain as home medication Continue to monitor blood pressure and consider starting hydrochlorothiazide 25 mg daily if elevated blood pressure noted, please note at ED patient was noted to have what elevated blood pressure reading required hydrochlorothiazide treatment, will follow. Continue Linzess 145 mcg daily for irritable bowel  syndrome, patient reports was on it until a month ago for IBS but stopped after ran out secondary to lack of compliance with outpatient follow-up Continue trazodone 50 mg at bedtime as needed for sleep Continue Atarax 25 mg every 6 hours as needed for anxiety Continue insulin NovoLog injection 6 units subcu 3 times daily with meals Continue insulin Semglee injection 16 units subcu daily at bedtime Continue nicotine gum p.o. as needed for smoking cessation  Today's assessment note: On assessment today on the unit, the pt reports that her mood is less depressed, and rates depression as #4/10, with 10 being high severity.  Sara [name] is alert, calm, cooperative and oriented to person, place, time, and situation.  Reports this is her third admission to [facility], and she receives good and fair treatment here.  Chart reviewed and findings shared with the treatment team and consult with attending psychiatrist.  CBG reviewed, ranging from 210-385, she continues carbohydrate Modified diet, sliding scale insulin NovoLog injection 6 units subcu 3 times daily with meals, insulin Semglee injection 16 units subcu daily at bedtime as per the diabetic management consultant.  We will continue to monitor her CBG and make changes to her insulin as required by the diabetic management consultant.  Nursing staff report patient attending therapeutic milieu and unit group activities, with learning of effective coping skills. She continues to remains interested in contacting halfway houses given her main stress related to homelessness and having no place to go, will follow-up with LCSW.  Vital signs reviewed with blood pressure of 140/93, pulse 113.  Clonidine 0.1 mg p.o. x 1 dose administered (allergy reviewed not specified red dye).  May resume  hydrochlorothiazide 25 mg p.o. daily if blood pressure continues to be elevated. Reports that anxiety is at manageable level Nursing staff report patient sleeping over 7.5 hours last night and  feeling restful.   Appetite is good and encouraged to comply with carbohydrate modified diet to help reduce her high blood glucose level. Concentration is improving Energy level is adequate Patient denies suicidal thoughts and denies suicidal intent or plan.  Denies having any HI.  Denies having psychotic symptoms.   Denies having side effects to current psychiatric medications.  She does not appear drowsy, and no stiffness or cogwheel and observed.  No signs of TD or EPS displayed during assessment.    We discussed compliance to current medication regimen.  Discussed the following psychosocial stressors:  Encouraging to continue to participate in therapeutic milieu and unit group activities.  We will address other chronic and acute stressors (homelessness), which contributed to the patient's worsening depression and SI, in order to reduce the risk of self-harm at discharge.        Past Psychiatric History:  Prior Psychiatric diagnoses: Reports history of diagnosed bipolar disorder but unable to specify symptoms consistent with mania or hypomania except in the presence of cocaine use Past Psychiatric Hospitalizations: 2 previous hospitalizations to this facility May 2024 for 4 days in June 2024 for 7 days, discharge medications in June included Prozac, Abilify, gabapentin, Percocet, trazodone   History of self mutilation: Denies Past suicide attempts: 2 previous suicide attempts last time years ago by overdose Past history of HI, violent or aggressive behavior: History of getting in fights related to irritability   Past Psychiatric medications trials: History of good response to Prozac and Abilify History of ECT/TMS: Denies   Outpatient psychiatric Follow up: When discharged from [facility] in June.  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 pylori infection    High risk medication use    Hypercholesterolemia    Hyperlipidemia, mixed    Hypertension    Other mixed anxiety disorders    Pollen allergies    Sickle cell trait (HCC)    Sleep apnea    Vitamin D deficiency     Past Surgical History:  Procedure Laterality Date   ABDOMINAL HYSTERECTOMY     APPENDECTOMY     COLONOSCOPY  08-16-2008   Dr. Vallarie Mare   ESOPHAGOGASTRODUODENOSCOPY  08-16-2008   Dr. Rayfield Citizen    Family History:  Family History  Problem Relation Age of Onset   Clotting disorder Mother    Crohn's disease Mother    Heart disease Mother    Colon cancer Father    Clotting disorder Brother    Diabetes Brother    Diabetes Maternal Aunt    Liver cancer Maternal Uncle    Prostate cancer Maternal Uncle    Colon cancer Maternal Uncle    Diabetes Paternal Aunt    Esophageal cancer Paternal Uncle    Family Psychiatric  History: See H&P  Social History:  Living situation: Homeless for the past 4 years Social support: Poor social support Marital Status: Was married once, divorced since age 30 years old Children: 2 sons ages 44 and 26 lives in [place] and [place], patient  reports being estranged from the Education: 10th grade Employment: Reports last job 2 to 3 months ago 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 Legal history: History of jail time related to fights years ago, history of prison time related to driving without license, years ago Trauma: Denies Access to guns: Denies  Current Medications: Current Facility-Administered Medications  Medication Dose Route Frequency Provider Last Rate Last Admin   alum & mag hydroxide-simeth (MAALOX/MYLANTA) 200-200-20 MG/5ML suspension 30 mL  30 mL Oral Q4H PRN Ardis Hughs, NP       ARIPiprazole (ABILIFY) tablet 10 mg  10 mg Oral Daily Attiah, Nadir, MD   10 mg at 10/16/22 0742    feeding supplement (ENSURE ENLIVE / ENSURE PLUS) liquid 237 mL  237 mL Oral BID BM Massengill, Harrold Donath, MD   237 mL at 10/16/22 0959   FLUoxetine (PROZAC) capsule 20 mg  20 mg Oral Daily Attiah, Nadir, MD   20 mg at 10/16/22 0741   gabapentin (NEURONTIN) capsule 600 mg  600 mg Oral TID Abbott Pao, Nadir, MD   600 mg at 10/16/22 1325   hydrOXYzine (ATARAX) tablet 25 mg  25 mg Oral Q6H PRN Attiah, Nadir, MD   25 mg at 10/14/22 1306   insulin aspart (novoLOG) injection 0-15 Units  0-15 Units Subcutaneous TID WC Sindy Guadeloupe, NP   11 Units at 10/16/22 1213   insulin aspart (novoLOG) injection 0-5 Units  0-5 Units Subcutaneous QHS Sindy Guadeloupe, NP   3 Units at 10/15/22 2119   insulin aspart (novoLOG) injection 6 Units  6 Units Subcutaneous TID WC Attiah, Nadir, MD       insulin glargine-yfgn (SEMGLEE) injection 16 Units  16 Units Subcutaneous QHS Attiah, Nadir, MD       linaclotide (LINZESS) capsule 145 mcg  145 mcg Oral QAC breakfast Abbott Pao, Nadir, MD   145 mcg at 10/16/22 0611   magnesium hydroxide (MILK OF MAGNESIA) suspension 30 mL  30 mL Oral Daily PRN Ardis Hughs, NP       nicotine polacrilex (NICORETTE) gum 2 mg  2 mg Oral PRN Abbott Pao, Nadir, MD       OLANZapine zydis (ZYPREXA) disintegrating tablet 5 mg  5 mg Oral Q8H PRN Attiah, Nadir, MD       oxyCODONE-acetaminophen (PERCOCET/ROXICET) 5-325 MG per tablet 1 tablet  1 tablet Oral Q8H PRN Abbott Pao, Nadir, MD   1 tablet at 10/16/22 0741   pantoprazole (PROTONIX) EC tablet 40 mg  40 mg Oral Daily Attiah, Nadir, MD   40 mg at 10/16/22 0742   traZODone (DESYREL) tablet 50 mg  50 mg Oral QHS PRN Ardis Hughs, NP   50 mg at 10/15/22 2112    Lab Results:  Results for orders placed or performed during the hospital encounter of 10/14/22 (from the past 48 hour(s))  Glucose, capillary     Status: Abnormal   Collection Time: 10/14/22 10:08 PM  Result Value Ref Range   Glucose-Capillary 443 (H) 70 - 99 mg/dL    Comment: Glucose reference range  applies only to samples taken after fasting for at least 8 hours.  Glucose, capillary     Status: Abnormal   Collection Time: 10/14/22 11:53 PM  Result Value Ref Range   Glucose-Capillary 337 (H) 70 - 99 mg/dL    Comment: Glucose reference range applies only to samples taken after fasting for at least 8 hours.  Glucose, capillary     Status: Abnormal   Collection Time: 10/15/22  2:00 AM  Result Value  Ref Range   Glucose-Capillary 184 (H) 70 - 99 mg/dL    Comment: Glucose reference range applies only to samples taken after fasting for at least 8 hours.  Glucose, capillary     Status: Abnormal   Collection Time: 10/15/22  5:33 AM  Result Value Ref Range   Glucose-Capillary 192 (H) 70 - 99 mg/dL    Comment: Glucose reference range applies only to samples taken after fasting for at least 8 hours.  Hemoglobin A1c     Status: Abnormal   Collection Time: 10/15/22  6:54 AM  Result Value Ref Range   Hgb A1c MFr Bld 11.2 (H) 4.8 - 5.6 %    Comment: (NOTE)         Prediabetes: 5.7 - 6.4         Diabetes: >6.4         Glycemic control for adults with diabetes: <7.0    Mean Plasma Glucose 275 mg/dL    Comment: (NOTE) Performed At: [facility] [address] [place], [place] [id number] Jolene Schimke MD [id number]   Lipid panel     Status: Abnormal   Collection Time: 10/15/22  6:54 AM  Result Value Ref Range   Cholesterol 274 (H) 0 - 200 mg/dL   Triglycerides 86 <010 mg/dL   HDL 63 >27 mg/dL   Total CHOL/HDL Ratio 4.3 RATIO   VLDL 17 0 - 40 mg/dL   LDL Cholesterol 253 (H) 0 - 99 mg/dL    Comment:        Total Cholesterol/HDL:CHD Risk Coronary Heart Disease Risk Table                     Men   Women  1/2 Average Risk   3.4   3.3  Average Risk       5.0   4.4  2 X Average Risk   9.6   7.1  3 X Average Risk  23.4   11.0        Use the calculated Patient Ratio above and the CHD Risk Table to determine the patient's CHD Risk.        ATP III CLASSIFICATION (LDL):  <100      mg/dL   Optimal  664-403  mg/dL   Near or Above                    Optimal  130-159  mg/dL   Borderline  474-259  mg/dL   High  >563     mg/dL   Very High Performed at [facility], [address] [place], [place] [address]   Glucose, capillary     Status: Abnormal   Collection Time: 10/15/22 11:58 AM  Result Value Ref Range   Glucose-Capillary 322 (H) 70 - 99 mg/dL    Comment: Glucose reference range applies only to samples taken after fasting for at least 8 hours.  Glucose, capillary     Status: Abnormal   Collection Time: 10/15/22 12:50 PM  Result Value Ref Range   Glucose-Capillary 385 (H) 70 - 99 mg/dL    Comment: Glucose reference range applies only to samples taken after fasting for at least 8 hours.  Glucose, capillary     Status: Abnormal   Collection Time: 10/15/22  4:36 PM  Result Value Ref Range   Glucose-Capillary 248 (H) 70 - 99 mg/dL    Comment: Glucose reference range applies only to samples taken after fasting for at least  8 hours.  Glucose, capillary     Status: Abnormal   Collection Time: 10/15/22  8:41 PM  Result Value Ref Range   Glucose-Capillary 283 (H) 70 - 99 mg/dL    Comment: Glucose reference range applies only to samples taken after fasting for at least 8 hours.  Glucose, capillary     Status: Abnormal   Collection Time: 10/16/22  6:00 AM  Result Value Ref Range   Glucose-Capillary 210 (H) 70 - 99 mg/dL    Comment: Glucose reference range applies only to samples taken after fasting for at least 8 hours.  Glucose, capillary     Status: Abnormal   Collection Time: 10/16/22 11:32 AM  Result Value Ref Range   Glucose-Capillary 327 (H) 70 - 99 mg/dL    Comment: Glucose reference range applies only to samples taken after fasting for at least 8 hours.  Glucose, capillary     Status: Abnormal   Collection Time: 10/16/22 12:10 PM  Result Value Ref Range   Glucose-Capillary 333 (H) 70 - 99 mg/dL    Comment: Glucose reference range  applies only to samples taken after fasting for at least 8 hours.   Blood Alcohol level:  Lab Results  Component Value Date   ETH <10 06/17/2022   [facility]  12/11/2009    <5        LOWEST DETECTABLE LIMIT FOR SERUM ALCOHOL IS 5 mg/dL FOR MEDICAL PURPOSES ONLY   Metabolic Disorder Labs: Lab Results  Component Value Date   HGBA1C 11.2 (H) 10/15/2022   MPG 275 10/15/2022   MPG 318 02/01/2022   No results found for: "PROLACTIN" Lab Results  Component Value Date   CHOL 274 (H) 10/15/2022   TRIG 86 10/15/2022   HDL 63 10/15/2022   CHOLHDL 4.3 10/15/2022   VLDL 17 10/15/2022   LDLCALC 194 (H) 10/15/2022   LDLCALC 131 (H) 02/01/2022   Physical Findings: AIMS: Facial and Oral Movements Muscles of Facial Expression: None, normal Lips and Perioral Area: None, normal Jaw: None, normal Tongue: None, normal,Extremity Movements Upper (arms, wrists, hands, fingers): None, normal Lower (legs, knees, ankles, toes): None, normal, Trunk Movements Neck, shoulders, hips: None, normal, Overall Severity Severity of abnormal movements (highest score from questions above): None, normal Incapacitation due to abnormal movements: None, normal Patient's awareness of abnormal movements (rate only patient's report): No Awareness, Dental Status Current problems with teeth and/or dentures?: No Does patient usually wear dentures?: No  CIWA:    COWS:     Musculoskeletal: Strength & Muscle Tone: within normal limits Gait & Station: normal Patient leans: N/A  Psychiatric Specialty Exam:  General Appearance: Appears older than stated age, dressed casually, with an average hygiene   Behavior: Cooperative and calm in general   Psychomotor Activity: No psychomotor agitation or retardation noted   Eye Contact: Limited Speech: Normal   Mood: Dysphoric Affect: Restricted sad affect   Thought Process: Linear and goal-directed Descriptions of Associations: Concrete Thought Content: Hallucinations:  Denies AH, VH, does not appear responding to stimuli Delusions: No paranoia  Suicidal Thoughts: Admits to passive SI wishing self dead and active SI with plan to overdose, able to contract for safety in the hospital but not if outside of the hospital Homicidal Thoughts: Denies HI intention or plan   Alertness/Orientation: Alert and oriented   Insight: Limited Judgment: Limited   Memory: intact   Executive Functions  Concentration: Fair Attention Span: Fair Recall: [employer] of Knowledge: Fair  Assets  Assets: Manufacturing systems engineer;  Physical Health; Resilience; Social Support; Desire for Improvement (Homelessness, desires to go to a halfway house upon discharge.)  Physical Exam: Physical Exam Vitals and nursing note reviewed.  HENT:     Head: Normocephalic.     Nose: Nose normal.     Mouth/Throat:     Mouth: Mucous membranes are moist.  Eyes:     Extraocular Movements: Extraocular movements intact.  Cardiovascular:     Rate and Rhythm: Tachycardia present.  Pulmonary:     Effort: Pulmonary effort is normal.  Abdominal:     Comments: Deferred  Genitourinary:    Comments: Deferred Musculoskeletal:        General: Normal range of motion.     Cervical back: Normal range of motion.  Skin:    General: Skin is warm.  Neurological:     General: No focal deficit present.     Mental Status: She is alert and oriented to person, place, and time.  Psychiatric:        Mood and Affect: Mood normal.        Behavior: Behavior normal.        Thought Content: Thought content normal.    Review of Systems  Gastrointestinal:  Positive for constipation.  All other systems reviewed and are negative.  Blood pressure (!) 88/68, pulse (!) 126, temperature 98 F (36.7 C), temperature source Oral, resp. rate 16, height 5\' 4"  (1.626 m), weight 62 kg, SpO2 100%. Body mass index is 23.45 kg/m.   Treatment Plan Summary: Daily contact with patient to assess and evaluate symptoms and  progress in treatment and Medication management  ASSESSMENT:  Diagnoses / Active Problems: Principal Problem: Major depressive disorder, recurrent severe without psychotic features (HCC) Diagnosis: Principal Problem:   Major depressive disorder, recurrent severe without psychotic features (HCC) Active Problems:   Moderate cocaine use disorder (HCC)   Cannabis use disorder  PLAN:  Safety and Monitoring:             -- Involuntary admission to inpatient psychiatric unit for safety, stabilization and treatment             -- Daily contact with patient to assess and evaluate symptoms and progress in treatment             -- Patient's case to be discussed in multi-disciplinary team meeting             -- Observation Level : q15 minute checks             -- Vital signs:  q12 hours             -- Precautions: suicide, elopement, and assault   2. Medications:      Continue Prozac 20 mg daily for management of depression and anxiety     Continue Abilify 5 mg daily for mood stabilization and depression, titrate to 10 mg daily 9/19                 Continue gabapentin 600 mg 3 times daily for neuropathic pain, home medication     Continue pantoprazole 40 mg daily for GERD, patient was on omeprazole 20 mg daily home medication     Metformin twice daily was listed as home medication at [facility] emergency room note but patient denies she was receiving metformin, history of metformin treatment that led to stomach upset and diarrhea, patient does not recall her home regimen for diabetes and has not been compliant with, consulted diabetes management team for further  management.    Continue Percocet 5/325 3 times daily as needed for chronic pain, patient was on 10/325 3 times daily as needed for pain as home medication    Continue to monitor blood pressure and consider starting hydrochlorothiazide 25 mg daily if elevated blood pressure noted, please note at emergency room patient was noted to have what  elevated blood pressure reading required hydrochlorothiazide treatment, will follow.   Start Linzess 145 mcg daily for irritable bowel syndrome, patient reports was on it until a month ago for IBS but stopped after ran out secondary to lack of compliance with outpatient follow-up   Continue trazodone 50 mg at bedtime as needed for sleep   Continue Atarax 25 mg every 6 hours as needed for anxiety  Agitation protocol: Olanzapine disintegrating tabs 5 mg p.o. every 8 hours as needed for agitation   --  The risks/benefits/side-effects/alternatives to this medication were discussed in detail with the patient and time was given for questions. The patient consents to medication trial.                -- Metabolic profile and EKG monitoring obtained while on an atypical antipsychotic (BMI: Lipid Panel: HbgA1c: QTc:)    3. Labs Reviewed: Lab reviewed from paper chart from [facility] including UDS negative, CBC no abnormalities noted, CMP no significant abnormalities noted EKG 9/17 QTc 453, lipid data shows elevated cholesterol 274 and elevated LDL 194, patient was counseled to check with primary care provider after discharge for consideration of a statin treatment      Lab ordered: hemoglobin A1c   4. Tobacco Use Disorder             -- Patient refuses NicoDerm patch, was started on nicotine gum per her request             -- Smoking cessation encouraged   5. Group and Therapy: -- Encouraged patient to participate in unit milieu and in scheduled group therapies              --Substance Use counseling: Patient was counseled regarding need to abstain completely from marijuana and cocaine after discharge.   6. Discharge Planning:              -- Social work and case management to assist with discharge planning and identification of hospital follow-up needs prior to discharge             -- Estimated LOS: 5-7 days             -- Discharge Concerns: Need to establish a safety plan; Medication  compliance and effectiveness             -- Discharge Goals: Return home with outpatient referrals for mental health follow-up including medication management/psychotherapy   Patient main stress related to being homeless, ongoing substance use problem including marijuana and cocaine, patient is interested in contacting halfway houses, will follow.   The patient is agreeable with the medication plan, as above. We will monitor the patient's response to pharmacologic treatment, and adjust medications as necessary. Patient is encouraged to participate in group therapy while admitted to the psychiatric unit. We will address other chronic and acute stressors, which contributed to the patient's worsening depression and SI, in order to reduce the risk of self-harm at discharge.     Physician Treatment Plan for Primary Diagnosis: Major depressive disorder, recurrent severe without psychotic features (HCC) Long Term Goal(s): Improvement in symptoms so as ready for discharge  Short Term Goals: Ability to identify changes in lifestyle to reduce recurrence of condition will improve, Ability to verbalize feelings will improve, Ability to disclose and discuss suicidal ideas, Ability to demonstrate self-control will improve, and Ability to identify and develop effective coping behaviors will improve     Cecilie Lowers, FNP 10/16/2022, 3:50 PM Patient ID: Sara [name], female   DOB: 01-25-1976, 47 y.o.   MRN: [id number]

## 2022-10-16 NOTE — BHH Counselor (Signed)
  10/16/2022  9:15 AM   Gerline [facility]  Type of note: housing resources   CSW provided [facility] housing list to Pt.   Signed:  Marya Landry MSW, LCSWA 10/16/2022  9:15 AM

## 2022-10-16 NOTE — Plan of Care (Signed)
  Problem: Education: Goal: Mental status will improve Outcome: Progressing   Problem: Activity: Goal: Interest or engagement in activities will improve Outcome: Progressing Goal: Sleeping patterns will improve Outcome: Progressing   Problem: Safety: Goal: Periods of time without injury will increase Outcome: Progressing

## 2022-10-16 NOTE — Progress Notes (Signed)
   10/16/22 0558  15 Minute Checks  Location Bedroom  Visual Appearance Calm  Behavior Sleeping  Sleep (Behavioral Health Patients Only)  Calculate sleep? (Click Yes once per 24 hr at 0600 safety check) Yes  Documented sleep last 24 hours 7.5

## 2022-10-16 NOTE — Plan of Care (Signed)
  Problem: Education: Goal: Emotional status will improve Outcome: Progressing Goal: Mental status will improve Outcome: Progressing   Problem: Activity: Goal: Interest or engagement in activities will improve Outcome: Progressing Goal: Sleeping patterns will improve Outcome: Progressing

## 2022-10-16 NOTE — Inpatient Diabetes Management (Signed)
Inpatient Diabetes Program Recommendations  AACE/ADA: New Consensus Statement on Inpatient Glycemic Control   Target Ranges:  Prepandial:   less than 140 mg/dL      Peak postprandial:   less than 180 mg/dL (1-2 hours)      Critically ill patients:  140 - 180 mg/dL    Latest Reference Range & Units 10/15/22 05:33 10/15/22 11:58 10/15/22 12:50 10/15/22 16:36 10/15/22 20:41 10/16/22 06:00  Glucose-Capillary 70 - 99 mg/dL 086 (H) 578 (H) 469 (H) 248 (H) 283 (H) 210 (H)   Review of Glycemic Control  Current orders for Inpatient glycemic control: Semglee 12 units at bedtime, Novolog 4 units TID with meals, Novolog 0-15 units TID with meals, Novolog 0-5 units QHS  Inpatient Diabetes Program Recommendations:    Insulin: Please consider increasing Semglee to 16 units at bedtime and meal coverage to Novolog 6 units TID with meals.  Thanks, Orlando Penner, RN, MSN, CDCES Diabetes Coordinator Inpatient Diabetes Program [phone] (Team Pager from 8am to 5pm)

## 2022-10-16 NOTE — Progress Notes (Addendum)
Pt denied SI/HI/AVH this morning. Pt rated her depression a 0/10, anxiety a 1/10, and feelings of hopelessness a 3/10. Pt reports she slept "good" last night. Patient complains of chronic back pain and rates it a 7/10. PRN Percocet administered for pain. Pt reports that her goal for today is "to go to groups and get into halfway house". Pt has been pleasant, calm, and cooperative throughout the shift. RN provided support and encouragement to patient. Pt given scheduled medications as prescribed. Q15 min checks verified for safety. Patient verbally contracts for safety. Patient compliant with medications and treatment plan. Patient is interacting well on the unit. Pt is safe on the unit.  10/16/22 1000  Psych Admission Type (Psych Patients Only)  Admission Status Involuntary  Psychosocial Assessment  Patient Complaints Anxiety;Depression  Eye Contact Fair  Facial Expression Flat  Affect Anxious;Depressed  Speech Logical/coherent  Interaction Assertive  Motor Activity Other (Comment) (WDL)  Appearance/Hygiene Unremarkable  Behavior Characteristics Cooperative;Appropriate to situation  Mood Depressed;Anxious  Thought Process  Coherency WDL  Content WDL  Delusions None reported or observed  Perception WDL  Hallucination None reported or observed  Judgment Impaired  Confusion None  Danger to Self  Current suicidal ideation? Denies  Self-Injurious Behavior No self-injurious ideation or behavior indicators observed or expressed   Agreement Not to Harm Self Yes  Description of Agreement Pt verbally contracts for safety  Danger to Others  Danger to Others None reported or observed

## 2022-10-16 NOTE — [facility] Group Notes (Signed)
[facility] Group Notes:  (Nursing/MHT/Case Management/Adjunct)  Date:  10/16/2022  Time:  8:50 PM  Type of Therapy:   wrap up   Participation Level:  Active  Participation Quality:  Appropriate  Affect:  Appropriate  Cognitive:  Alert and Appropriate  Insight:  Appropriate and Good  Engagement in Group:  Engaged  Modes of Intervention:  Discussion  Summary of Progress/Problems: Pt. Shared how today wasn't bad but the new medication has her sleeping a lot.  She also shared she hopes she adjust to the medication soon.   Sara [name] [place] 10/16/2022, 8:50 PM

## 2022-10-16 NOTE — Group Note (Signed)
LCSW Group Therapy Note   Group Date: 10/16/2022 Start Time: 1100 End Time: 1200  LCSW Group Therapy Note     10/16/2022 1:01 PM     Type of Therapy and Topic:  Group Therapy:  Strengths     Participation Level:  Minimal     Description of Group: In this group patients will be encouraged to explore personal strengths that are conducive to recovery and well-being. They will be guided to discuss their thoughts, feelings, and behaviors related to these strengths. The group will process together ways that individualized strengths help patients build resilience and facilitate positive treatment outcomes. Each patient will be challenged to identify personal strengths that they foster as well as positive characteristics they would like to embody as they progress through treatment.This group will be process-oriented, with patients participating in exploration of their own experiences as well as giving and receiving support and challenge from other group members.     Therapeutic Goals:  1.    Patient will identify a collaborative list of positive characteristics that promote positive treatment outcomes and well-being.  2.    Patient will identify three personal strengths that they currently exemplify.  3.    Patient will identify feelings, thought process and behaviors related to these strengths.  4.    Patient will identify two ways they will use these personal strengths to help them reach their individualized treatment goals.         Summary of Patient Progress   Pt was active in group        Therapeutic Modalities:    Cognitive Behavioral Therapy  Solution Focused Therapy  Motivational Interviewing   Sara [name], Theresia Majors 10/16/2022  1:01 PM

## 2022-10-17 DIAGNOSIS — F332 Major depressive disorder, recurrent severe without psychotic features: Secondary | ICD-10-CM | POA: Diagnosis not present

## 2022-10-17 LAB — GLUCOSE, CAPILLARY
Glucose-Capillary: 181 mg/dL — ABNORMAL HIGH (ref 70–99)
Glucose-Capillary: 234 mg/dL — ABNORMAL HIGH (ref 70–99)
Glucose-Capillary: 282 mg/dL — ABNORMAL HIGH (ref 70–99)
Glucose-Capillary: 264 mg/dL — ABNORMAL HIGH (ref 70–99)
Glucose-Capillary: 153 mg/dL — ABNORMAL HIGH (ref 70–99)

## 2022-10-17 NOTE — [facility] Counselor (Signed)
  10/17/2022  3:19 PM   Sara [name]  Type of note: housing resources   CSW provided SLA information to Pt to reach out.   Signed:  Marya Landry MSW, [facility] 10/17/2022  3:19 PM

## 2022-10-17 NOTE — Progress Notes (Signed)
   10/17/22 0950  Psych Admission Type (Psych Patients Only)  Admission Status Involuntary  Psychosocial Assessment  Patient Complaints Hopelessness  Eye Contact Fair  Facial Expression Animated  Affect Appropriate to circumstance  Speech Logical/coherent  Interaction Assertive  Motor Activity Slow  Appearance/Hygiene Unremarkable  Behavior Characteristics Cooperative  Mood Pleasant  Thought Process  Coherency WDL  Content WDL  Delusions None reported or observed  Perception WDL  Hallucination None reported or observed  Judgment Poor  Confusion None  Danger to Self  Current suicidal ideation? Denies  Self-Injurious Behavior No self-injurious ideation or behavior indicators observed or expressed   Agreement Not to Harm Self Yes  Description of Agreement verbal  Danger to Others  Danger to Others None reported or observed

## 2022-10-17 NOTE — Progress Notes (Addendum)
[facility] MD Progress Note  10/17/2022 3:59 PM Sara [name]  MRN:  [id number]  Principal Problem:   Major depressive disorder, recurrent severe without psychotic features [facility]) Active Problems:   Moderate cocaine use disorder (HCC)   Cannabis use disorder  Reason for Admission:  Sara [name] is a 47 y.o., female with a past psychiatric history significant for depression and substance use who presents to the [facility] from [facility] under IVC for evaluation and management of worsening depression and SI. According to outside records, the patient presented to [facility] emergency room reporting worsening depression, SI with plan to overdose.The patient is currently on Hospital Day 3.   24-hour chart review: Vital signs within normal limits with exception of heart rate which was slightly elevated earlier today morning at 103, then 114.  Patient reported a good sleep quality, staff reported that she slept good.  She is compliant with medications.     Patient assessment note: Pt with a slightly less depressed mood than at time of admission,  attention to personal hygiene and grooming is fair, eye contact is good, speech is clear & coherent. Thought contents are organized and logical, and pt currently denies SI/HI/AVH or paranoia. There is no evidence of delusional thoughts.   Patient reports that appetite has slightly improved since admission, and she is now eating more, will therefore discontinue Ensure nutritional shakes at this time.  She reports that even though she is in a lot of pain related to her chronic back problems, she is making efforts to come out to the day room and engage in unit activities and interact with peers.  She reports that sleep last night was fair, reports that she is tolerating medications well.  Even though patient is denying SI at this time, she is not able to verbally contract for safety outside of the hospital building, and remains at risk of danger  to herself if discharged without a proper plan in place for her safety.  We are therefore continuing hospitalization at this time, and continuing medications as below, while patient is continuing to make efforts to get into [facility].  She reports that she has been making efforts to call today, and we will continue to place those calls later today.  We are continuing all medications as listed below, and increasing Abilify to 10 mg starting 9/20 for mood stabilization.  Ordering the following labs: Vitamin D, TSH   Past Psychiatric History:  Prior Psychiatric diagnoses: Reports history of diagnosed bipolar disorder but unable to specify symptoms consistent with mania or hypomania except in the presence of cocaine use Past Psychiatric Hospitalizations: 2 previous hospitalizations to this facility May 2024 for 4 days in June 2024 for 7 days, discharge medications in June included Prozac, Abilify, gabapentin, Percocet, trazodone   History of self mutilation: Denies Past suicide attempts: 2 previous suicide attempts last time years ago by overdose Past history of HI, violent or aggressive behavior: History of getting in fights related to irritability   Past Psychiatric medications trials: History of good response to Prozac and Abilify History of ECT/TMS: Denies   Outpatient psychiatric Follow up: When discharged from [facility] in June.  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 pylori infection    High risk medication use    Hypercholesterolemia    Hyperlipidemia, mixed    Hypertension    Other mixed anxiety disorders    Pollen allergies    Sickle cell trait (HCC)    Sleep apnea    Vitamin D deficiency     Past  Surgical History:  Procedure Laterality Date   ABDOMINAL HYSTERECTOMY     APPENDECTOMY     COLONOSCOPY  08-16-2008   Dr. Vallarie Mare   ESOPHAGOGASTRODUODENOSCOPY  08-16-2008   Dr. Rayfield Citizen    Family History:  Family History  Problem Relation Age of Onset   Clotting disorder Mother    Crohn's disease Mother    Heart disease Mother    Colon cancer Father    Clotting disorder Brother    Diabetes Brother    Diabetes Maternal Aunt    Liver cancer Maternal Uncle    Prostate cancer Maternal Uncle    Colon cancer Maternal Uncle    Diabetes Paternal Aunt    Esophageal cancer Paternal Uncle    Family Psychiatric  History: See H&P  Social History:  Living situation: Homeless for the past 4 years Social support: Poor social support Marital Status: Was married once, divorced since age 77 years old Children: 2 sons ages 4 and 14 lives in [place] and [place], patient reports being estranged from the Education: 10th grade Employment: Reports last job 2 to 3 months ago 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 Legal history: History of jail time related to fights years ago, history of prison time related to driving without license, years ago Trauma: Denies Access to guns: Denies  Current Medications: Current Facility-Administered Medications  Medication Dose Route Frequency Provider Last Rate Last Admin   alum & mag hydroxide-simeth (MAALOX/MYLANTA) 200-200-20 MG/5ML suspension 30 mL  30 mL Oral Q4H PRN Ardis Hughs, NP       ARIPiprazole (ABILIFY) tablet 10 mg  10 mg Oral Daily Attiah, Nadir, MD   10 mg at 10/17/22 0824   feeding supplement (ENSURE ENLIVE / ENSURE PLUS) liquid 237 mL  237 mL Oral BID BM Massengill, Nathan, MD   237 mL at 10/17/22 1358   FLUoxetine (PROZAC) capsule 20 mg  20 mg Oral Daily Attiah, Nadir, MD   20 mg at 10/17/22 0824   gabapentin (NEURONTIN) capsule 600 mg  600 mg Oral TID Abbott Pao, Nadir, MD   600 mg at 10/17/22 1358   hydrOXYzine (ATARAX) tablet 25 mg  25 mg Oral  Q6H PRN Attiah, Nadir, MD   25 mg at 10/17/22 1211   insulin aspart (novoLOG) injection 0-15 Units  0-15 Units Subcutaneous TID WC Sindy Guadeloupe, NP   8 Units at 10/17/22 1206   insulin aspart (novoLOG) injection 0-5 Units  0-5 Units Subcutaneous QHS Sindy Guadeloupe, NP   3 Units at 10/15/22 2119   insulin aspart (novoLOG) injection 6 Units  6 Units Subcutaneous TID WC Attiah, Nadir, MD   6 Units at 10/17/22 1209   insulin glargine-yfgn (SEMGLEE) injection 16 Units  16 Units Subcutaneous QHS Abbott Pao, Nadir, MD   16 Units at 10/16/22 2137   linaclotide (LINZESS) capsule 145 mcg  145 mcg Oral QAC breakfast Abbott Pao, Nadir, MD   145 mcg at 10/17/22 0643   magnesium hydroxide (MILK OF MAGNESIA) suspension 30 mL  30 mL Oral Daily PRN Ardis Hughs, NP       nicotine polacrilex (NICORETTE) gum 2 mg  2 mg  Oral PRN Sarita Bottom, MD       OLANZapine zydis (ZYPREXA) disintegrating tablet 5 mg  5 mg Oral Q8H PRN Abbott Pao, Nadir, MD       oxyCODONE-acetaminophen (PERCOCET/ROXICET) 5-325 MG per tablet 1 tablet  1 tablet Oral Q8H PRN Abbott Pao, Nadir, MD   1 tablet at 10/17/22 0825   pantoprazole (PROTONIX) EC tablet 40 mg  40 mg Oral Daily Attiah, Nadir, MD   40 mg at 10/17/22 0825   traZODone (DESYREL) tablet 50 mg  50 mg Oral QHS PRN Ardis Hughs, NP   50 mg at 10/16/22 2130    Lab Results:  Results for orders placed or performed during the hospital encounter of 10/14/22 (from the past 48 hour(s))  Glucose, capillary     Status: Abnormal   Collection Time: 10/15/22  4:36 PM  Result Value Ref Range   Glucose-Capillary 248 (H) 70 - 99 mg/dL    Comment: Glucose reference range applies only to samples taken after fasting for at least 8 hours.  Glucose, capillary     Status: Abnormal   Collection Time: 10/15/22  8:41 PM  Result Value Ref Range   Glucose-Capillary 283 (H) 70 - 99 mg/dL    Comment: Glucose reference range applies only to samples taken after fasting for at least 8 hours.  Glucose, capillary      Status: Abnormal   Collection Time: 10/16/22  6:00 AM  Result Value Ref Range   Glucose-Capillary 210 (H) 70 - 99 mg/dL    Comment: Glucose reference range applies only to samples taken after fasting for at least 8 hours.  Glucose, capillary     Status: Abnormal   Collection Time: 10/16/22 11:32 AM  Result Value Ref Range   Glucose-Capillary 327 (H) 70 - 99 mg/dL    Comment: Glucose reference range applies only to samples taken after fasting for at least 8 hours.  Glucose, capillary     Status: Abnormal   Collection Time: 10/16/22 12:10 PM  Result Value Ref Range   Glucose-Capillary 333 (H) 70 - 99 mg/dL    Comment: Glucose reference range applies only to samples taken after fasting for at least 8 hours.  Glucose, capillary     Status: Abnormal   Collection Time: 10/16/22  4:43 PM  Result Value Ref Range   Glucose-Capillary 131 (H) 70 - 99 mg/dL    Comment: Glucose reference range applies only to samples taken after fasting for at least 8 hours.  Glucose, capillary     Status: Abnormal   Collection Time: 10/16/22  9:07 PM  Result Value Ref Range   Glucose-Capillary 180 (H) 70 - 99 mg/dL    Comment: Glucose reference range applies only to samples taken after fasting for at least 8 hours.  Glucose, capillary     Status: Abnormal   Collection Time: 10/17/22  6:25 AM  Result Value Ref Range   Glucose-Capillary 181 (H) 70 - 99 mg/dL    Comment: Glucose reference range applies only to samples taken after fasting for at least 8 hours.  Glucose, capillary     Status: Abnormal   Collection Time: 10/17/22  8:03 AM  Result Value Ref Range   Glucose-Capillary 234 (H) 70 - 99 mg/dL    Comment: Glucose reference range applies only to samples taken after fasting for at least 8 hours.  Glucose, capillary     Status: Abnormal   Collection Time: 10/17/22 12:00 PM  Result Value Ref Range   Glucose-Capillary  282 (H) 70 - 99 mg/dL    Comment: Glucose reference range applies only to samples  taken after fasting for at least 8 hours.   Blood Alcohol level:  Lab Results  Component Value Date   ETH <10 06/17/2022   [facility]  12/11/2009    <5        LOWEST DETECTABLE LIMIT FOR SERUM ALCOHOL IS 5 mg/dL FOR MEDICAL PURPOSES ONLY   Metabolic Disorder Labs: Lab Results  Component Value Date   HGBA1C 11.2 (H) 10/15/2022   MPG 275 10/15/2022   MPG 318 02/01/2022   No results found for: "PROLACTIN" Lab Results  Component Value Date   CHOL 274 (H) 10/15/2022   TRIG 86 10/15/2022   HDL 63 10/15/2022   CHOLHDL 4.3 10/15/2022   VLDL 17 10/15/2022   LDLCALC 194 (H) 10/15/2022   LDLCALC 131 (H) 02/01/2022   Physical Findings: AIMS: Facial and Oral Movements Muscles of Facial Expression: None, normal Lips and Perioral Area: None, normal Jaw: None, normal Tongue: None, normal,Extremity Movements Upper (arms, wrists, hands, fingers): None, normal Lower (legs, knees, ankles, toes): None, normal, Trunk Movements Neck, shoulders, hips: None, normal, Overall Severity Severity of abnormal movements (highest score from questions above): None, normal Incapacitation due to abnormal movements: None, normal Patient's awareness of abnormal movements (rate only patient's report): No Awareness, Dental Status Current problems with teeth and/or dentures?: No Does patient usually wear dentures?: No  CIWA:    COWS:     Musculoskeletal: Strength & Muscle Tone: within normal limits Gait & Station: normal Patient leans: N/A  Psychiatric Specialty Exam:  General Appearance: Appears older than stated age, dressed casually, with an average hygiene   Behavior: Cooperative and calm in general   Psychomotor Activity: No psychomotor agitation or retardation noted   Eye Contact: Limited Speech: Normal   Mood: Dysphoric Affect: Restricted sad affect   Thought Process: Linear and goal-directed Descriptions of Associations: Concrete Thought Content: Hallucinations: Denies AH, VH, does not  appear responding to stimuli Delusions: No paranoia  Suicidal Thoughts: Admits to passive SI wishing self dead and active SI with plan to overdose, able to contract for safety in the hospital but not if outside of the hospital Homicidal Thoughts: Denies HI intention or plan   Alertness/Orientation: Alert and oriented   Insight: Limited Judgment: Limited   Memory: intact   Executive Functions  Concentration: Fair Attention Span: Fair Recall: [employer] of Knowledge: Fair  Assets  Assets: Communication Skills  Physical Exam: Physical Exam Vitals and nursing note reviewed.  HENT:     Head: Normocephalic.     Nose: Nose normal.     Mouth/Throat:     Mouth: Mucous membranes are moist.  Eyes:     Extraocular Movements: Extraocular movements intact.  Cardiovascular:     Rate and Rhythm: Tachycardia present.  Pulmonary:     Effort: Pulmonary effort is normal.  Abdominal:     Comments: Deferred  Genitourinary:    Comments: Deferred Musculoskeletal:        General: Normal range of motion.     Cervical back: Normal range of motion.  Skin:    General: Skin is warm.     Findings: No rash.  Neurological:     General: No focal deficit present.     Mental Status: She is alert and oriented to person, place, and time.  Psychiatric:        Mood and Affect: Mood normal.        Behavior:  Behavior normal.        Thought Content: Thought content normal.    Review of Systems  Constitutional: Negative.   HENT: Negative.    Eyes: Negative.   Respiratory: Negative.    Cardiovascular: Negative.   Gastrointestinal:  Positive for constipation.  Genitourinary: Negative.   Musculoskeletal: Negative.   Skin: Negative.  Negative for rash.  Neurological: Negative.  Negative for dizziness.  Psychiatric/Behavioral:  Positive for depression and substance abuse. Negative for hallucinations, memory loss and suicidal ideas. The patient is nervous/anxious and has insomnia.   All other systems  reviewed and are negative.  Blood pressure 100/83, pulse (!) 114, temperature 98.1 F (36.7 C), temperature source Oral, resp. rate 16, height 5\' 4"  (1.626 m), weight 62 kg, SpO2 100%. Body mass index is 23.45 kg/m.   Treatment Plan Summary: Daily contact with patient to assess and evaluate symptoms and progress in treatment and Medication management  Diagnoses / Active Problems: Principal Problem: Major depressive disorder, recurrent severe without psychotic features (HCC) Diagnosis: Principal Problem:   Major depressive disorder, recurrent severe without psychotic features (HCC) Active Problems:   Moderate cocaine use disorder (HCC)   Cannabis use disorder  PLAN:  Safety and Monitoring:             -- Involuntary admission to inpatient psychiatric unit for safety, stabilization and treatment             -- Daily contact with patient to assess and evaluate symptoms and progress in treatment             -- Patient's case to be discussed in multi-disciplinary team meeting             -- Observation Level : q15 minute checks             -- Vital signs:  q12 hours             -- Precautions: suicide, elopement, and assault   2. Medications:  -Continue Prozac 20 mg daily for management of depression and anxiety -Continue Abilify 10 mg daily  for mood stabilization  -Continue trazodone 50 mg at bedtime as needed for sleep -Continue Atarax 25 mg every 6 hours as needed for anxiety -Continue agitation protocol: Olanzapine disintegrating tabs 5 mg p.o. every 8 hours as needed for agitation   Medications for medical reasons -Continue Percocet 5/325 TID PRN for chronic pain -Continue Linzess 145 mcg daily for IBS (home med) --Continue pantoprazole 40 mg daily for GERD -Continue gabapentin 600 mg TID for  neuropathy -Continue insulin Semglee 16 units nightly for diabetes -Continue insulin Novolog 6 units 3 times daily with meals -Continue insulin NovoLog 0 to 5 units as per sliding  scale in the [facility]   --  The risks/benefits/side-effects/alternatives to this medication were discussed in detail with the patient and time was given for questions. The patient consents to medication trial.  -- Metabolic profile and EKG monitoring obtained while on an atypical antipsychotic (BMI: Lipid Panel: HbgA1c: QTc:)    3. Per admissions assessment: Labs Reviewed: Lab reviewed from paper chart from [facility] including UDS negative, CBC no abnormalities noted, CMP no significant abnormalities noted EKG 9/17 QTc 453, lipid data shows elevated cholesterol 274 and elevated LDL 194, patient was counseled to check with primary care provider after discharge for consideration of a statin treatment hemoglobin A1c WNL   4. Tobacco Use Disorder             -- Patient refuses  NicoDerm patch, was started on nicotine gum per her request             -- Smoking cessation encouraged   5. Group and Therapy: -- Encouraged patient to participate in unit milieu and in scheduled group therapies              --Substance Use counseling: Patient was counseled regarding need to abstain completely from marijuana and cocaine after discharge.   6. Discharge Planning:              -- Social work and case management to assist with discharge planning and identification of hospital follow-up needs prior to discharge             -- Estimated LOS: 5-7 days             -- Discharge Concerns: Need to establish a safety plan; Medication compliance and effectiveness             -- Discharge Goals: Return home with outpatient referrals for mental health follow-up including medication management/psychotherapy   Physician Treatment Plan for Primary Diagnosis: Major depressive disorder, recurrent severe without psychotic features (HCC) Long Term Goal(s): Improvement in symptoms so as ready for discharge   Short Term Goals: Ability to identify changes in lifestyle to reduce recurrence of condition will improve, Ability to  verbalize feelings will improve, Ability to disclose and discuss suicidal ideas, Ability to demonstrate self-control will improve, and Ability to identify and develop effective coping behaviors will improve     Starleen Blue, NP 10/17/2022, 3:59 PM Patient ID: [name], female   DOB: [birth year], 47 y.o.   MRN: [id number] Patient ID: ASA DAIUTO, female   DOB: May 04, 1975, 47 y.o.   MRN: [id number]

## 2022-10-17 NOTE — Group Note (Signed)
Recreation Therapy Group Note   Group Topic:Problem Solving  Group Date: 10/17/2022 Start Time: 1008 End Time: 1025 Facilitators: Norris Brumbach-McCall, LRT,CTRS Location: [address] Dayroom   Group Topic: Communication, Team Building, Problem Solving  Goal Area(s) Addresses:  Patient will effectively work with peer towards shared goal.  Patient will identify skills used to make activity successful.  Patient will identify how skills used during activity can be used to reach post d/c goals.   Intervention: STEM Activity  Group Description: Stage manager. In teams of 3-5, patients were given 12 plastic drinking straws and an equal length of masking tape. Using the materials provided, patients were asked to build a landing pad to catch a golf ball dropped from approximately 5 feet in the air. All materials were required to be used by the team in their design. LRT facilitated post-activity discussion.  Education: Pharmacist, community, Scientist, physiological, Discharge Planning   Education Outcome: Acknowledges education/In group clarification offered/Needs additional education.    Affect/Mood: Appropriate   Participation Level: Engaged   Participation Quality: Independent   Behavior: Appropriate   Speech/Thought Process: Focused   Insight: Good   Judgement: Good   Modes of Intervention: STEM Activity   Patient Response to Interventions:  Engaged   Education Outcome:  In group clarification offered    Clinical Observations/Individualized Feedback: Pt was engaged and offered ideas for group to complete the activity. Pt was bright and active throughout group.     Plan: Continue to engage patient in RT group sessions 2-3x/week.   Benney Sommerville-McCall, LRT,CTRS 10/17/2022 12:33 PM

## 2022-10-17 NOTE — Progress Notes (Signed)
   10/16/22 2200  Psych Admission Type (Psych Patients Only)  Admission Status Involuntary  Psychosocial Assessment  Patient Complaints None  Eye Contact Fair  Facial Expression Animated  Affect Appropriate to circumstance  Speech Logical/coherent  Interaction Assertive  Motor Activity Slow  Appearance/Hygiene Unremarkable  Behavior Characteristics Cooperative;Appropriate to situation  Mood Pleasant  Thought Process  Coherency WDL  Content WDL  Delusions None reported or observed  Perception WDL  Hallucination None reported or observed  Judgment Poor  Confusion None  Danger to Self  Current suicidal ideation? Denies  Self-Injurious Behavior No self-injurious ideation or behavior indicators observed or expressed   Agreement Not to Harm Self Yes  Description of Agreement verbal  Danger to Others  Danger to Others None reported or observed

## 2022-10-17 NOTE — Plan of Care (Signed)
Problem: Education: Goal: Knowledge of [facility] Education information/materials will improve Outcome: Progressing   Problem: Activity: Goal: Interest or engagement in activities will improve Outcome: Progressing   Problem: Coping: Goal: Ability to verbalize frustrations and anger appropriately will improve Outcome: Progressing   Problem: Health Behavior/Discharge Planning: Goal: Identification of resources available to assist in meeting health care needs will improve Outcome: Progressing   Problem: Physical Regulation: Goal: Ability to maintain clinical measurements within normal limits will improve Outcome: Progressing

## 2022-10-17 NOTE — [facility] Group Notes (Signed)
Adult Psychoeducational Group Note  Date:  10/17/2022 Time:  9:02 PM  Group Topic/Focus:  Wrap-Up Group:   The focus of this group is to help patients review their daily goal of treatment and discuss progress on daily workbooks.  Participation Level:  Active  Participation Quality:  Attentive  Affect:  Appropriate  Cognitive:  Alert  Insight: Improving  Engagement in Group:  Engaged  Modes of Intervention:  Discussion and Support  Additional Comments:  Pt attended and participated in group.  Maura Crandall Cassandra 10/17/2022, 9:02 PM

## 2022-10-18 DIAGNOSIS — F332 Major depressive disorder, recurrent severe without psychotic features: Secondary | ICD-10-CM | POA: Diagnosis not present

## 2022-10-18 LAB — GLUCOSE, CAPILLARY
Glucose-Capillary: 191 mg/dL — ABNORMAL HIGH (ref 70–99)
Glucose-Capillary: 190 mg/dL — ABNORMAL HIGH (ref 70–99)
Glucose-Capillary: 162 mg/dL — ABNORMAL HIGH (ref 70–99)
Glucose-Capillary: 100 mg/dL — ABNORMAL HIGH (ref 70–99)

## 2022-10-18 NOTE — Progress Notes (Signed)
   10/18/22 1600  Psych Admission Type (Psych Patients Only)  Admission Status Involuntary  Psychosocial Assessment  Patient Complaints None  Eye Contact Fair  Facial Expression Animated  Affect Appropriate to circumstance  Speech Logical/coherent  Interaction Assertive  Motor Activity Slow  Appearance/Hygiene Unremarkable  Behavior Characteristics Cooperative;Appropriate to situation  Mood Pleasant  Thought Process  Coherency WDL  Content WDL  Delusions None reported or observed  Perception WDL  Hallucination None reported or observed  Judgment Poor  Confusion None  Danger to Self  Current suicidal ideation? Denies  Self-Injurious Behavior No self-injurious ideation or behavior indicators observed or expressed   Danger to Others  Danger to Others None reported or observed

## 2022-10-18 NOTE — [facility] Group Notes (Signed)
[facility] Group Notes:  (Nursing/MHT/Case Management/Adjunct)  Date:  10/18/2022  Time:  9:01 PM  Type of Therapy:  The focus of this group is to help patients review their daily goal of treatment and discuss progress on daily workbooks.    Participation Level:  Active  Participation Quality:  Appropriate  Affect:  Appropriate  Cognitive:  Appropriate  Insight:  Appropriate  Engagement in Group:  Supportive  Modes of Intervention:  Socialization and Support  Summary of Progress/Problems: Pt attended group  Sara [name] 10/18/2022, 9:01 PM

## 2022-10-18 NOTE — Group Note (Signed)
Date:  10/18/2022 Time:  10:17 AM  Group Topic/Focus:  Goals Group:   The focus of this group is to help patients establish daily goals to achieve during treatment and discuss how the patient can incorporate goal setting into their daily lives to aide in recovery.    Participation Level:  Minimal  Participation Quality:  Appropriate  Affect:  Appropriate  Cognitive:  Appropriate  Insight: Appropriate  Engagement in Group:  Limited  Modes of Intervention:  Discussion  Additional Comments:     Reymundo Poll 10/18/2022, 10:17 AM

## 2022-10-18 NOTE — [facility] Suicide Risk Assessment (Signed)
[facility] INPATIENT:  Family/Significant Other Suicide Prevention Education  Suicide Prevention Education:  Contact Attempts: Sara [name], (name of family member/significant other) has been identified by the patient as the family member/significant other with whom the patient will be residing, and identified as the person(s) who will aid the patient in the event of a mental health crisis.  With written consent from the patient, two attempts were made to provide suicide prevention education, prior to and/or following the patient's discharge.  We were unsuccessful in providing suicide prevention education.  A suicide education pamphlet was given to the patient to share with family/significant other.  Date and time of second attempt: 10/18/22 at 3:40 PM   Sara [name] 10/18/2022, 3:42 PM

## 2022-10-18 NOTE — Plan of Care (Signed)
  Problem: Education: Goal: Mental status will improve Outcome: Progressing   Problem: Activity: Goal: Interest or engagement in activities will improve Outcome: Progressing   

## 2022-10-18 NOTE — Progress Notes (Signed)
[facility] MD Progress Note  10/18/2022 4:26 PM Sara [name]  MRN:  [id number]  Principal Problem:   Major depressive disorder, recurrent severe without psychotic features (HCC) Active Problems:   Moderate cocaine use disorder (HCC)   Cannabis use disorder  Reason for Admission:  Sara [name] is a 47 y.o., female with a past psychiatric history significant for depression and substance use who presents to the [facility] from [facility] under IVC for evaluation and management of worsening depression and SI. According to outside records, the patient presented to [facility] emergency room reporting worsening depression, SI with plan to overdose.The patient is currently on Hospital Day 4.   Yesterday the psychiatry team made the following recommendations: -Continue Prozac 20 mg daily for management of depression and anxiety -Continue Abilify 10 mg daily  for mood stabilization  -Continue trazodone 50 mg at bedtime as needed for sleep -Continue Atarax 25 mg every 6 hours as needed for anxiety -Continue agitation protocol: Olanzapine disintegrating tabs 5 mg p.o. every 8 hours as needed for agitation  Today's assessment note: On assessment today, the pt reports that her mood is less depressed and rated depression as #1/10, with 10 being high severity.  Attention to personal hygiene and grooming is fair, eye contact is good, speech is clear & coherent. Thought contents are organized and logical, and pt currently attending therapeutic milieu and unit group activities.  Improvement in CBG values ranging 190 to 191 today.  Hemoglobin A1c 11.2 on 10/15/2022. Reports that anxiety is at manageable level. Nursing staff report patient sleeping over 5 hours last night.  Patient reports she is restful.   Appetite is good Concentration is improved  Energy level is adequate Denies suicidal thoughts and denies suicidal intent or plan while in the hospital. Although patient is denying SI at  this time, she is not able to verbally contract for safety outside of the hospital building, and remains at risk of danger to herself if discharged without a proper plan in place for her safety.  We are therefore continuing hospitalization at this time, and continuing medications as below, while patient is continuing to make efforts to get into [facility].  She reports that she has been making efforts and will continue to make a for until she gets into one.   We are continuing all medications as listed below, and continues on Abilify 10 mg for mood stabilization.  Denies having any HI.  Denies having psychotic symptoms. There is no evidence of delusional thoughts or paranoia.   Denies having side effects to current psychiatric medications.   We discussed compliance to current medication regimen.   Past Psychiatric History:  Prior Psychiatric diagnoses: Reports history of diagnosed bipolar disorder but unable to specify symptoms consistent with mania or hypomania except in the presence of cocaine use Past Psychiatric Hospitalizations: 2 previous hospitalizations to this facility May 2024 for 4 days in June 2024 for 7 days, discharge medications in June included Prozac, Abilify, gabapentin, Percocet, trazodone   History of self mutilation: Denies Past suicide attempts: 2 previous suicide attempts last time years ago by overdose Past history of HI, violent or aggressive behavior: History of getting in fights related to irritability   Past Psychiatric medications trials: History of good response to Prozac and Abilify History of ECT/TMS: Denies   Outpatient psychiatric Follow up: When discharged from [facility] in June.  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 pylori infection    High risk medication use    Hypercholesterolemia    Hyperlipidemia, mixed    Hypertension    Other mixed anxiety disorders    Pollen allergies    Sickle cell trait (HCC)    Sleep apnea    Vitamin D deficiency     Past Surgical History:  Procedure Laterality Date   ABDOMINAL HYSTERECTOMY     APPENDECTOMY     COLONOSCOPY  08-16-2008   Dr. Vallarie Mare   ESOPHAGOGASTRODUODENOSCOPY  08-16-2008   Dr. Rayfield Citizen    Family History:  Family History  Problem Relation Age of Onset   Clotting disorder Mother    Crohn's disease Mother    Heart disease Mother    Colon cancer Father    Clotting disorder Brother    Diabetes Brother    Diabetes Maternal Aunt    Liver cancer Maternal Uncle    Prostate cancer Maternal Uncle    Colon cancer Maternal Uncle    Diabetes Paternal Aunt    Esophageal cancer Paternal Uncle    Family Psychiatric  History: See H&P  Social History:  Living situation: Homeless for the past 4 years Social support: Poor social support Marital Status: Was married once, divorced since age 13 years old Children: 2 sons ages 62 and 77 lives in [place] and [place], patient reports being estranged from the Education: 10th grade Employment: Reports last job 2 to 3 months ago 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 Legal history: History of jail time related to fights years ago, history of prison time related to driving without license, years ago Trauma: Denies Access to guns: Denies  Current Medications: Current Facility-Administered Medications  Medication Dose Route Frequency Provider Last Rate Last Admin   alum & mag hydroxide-simeth (MAALOX/MYLANTA) 200-200-20 MG/5ML suspension 30 mL  30 mL Oral Q4H PRN Ardis Hughs, NP       ARIPiprazole (ABILIFY) tablet 10 mg  10 mg Oral Daily Attiah, Nadir, MD   10 mg at 10/18/22 0756   feeding supplement (ENSURE ENLIVE / ENSURE PLUS) liquid 237 mL  237 mL Oral BID BM  Massengill, Nathan, MD   237 mL at 10/17/22 1358   FLUoxetine (PROZAC) capsule 20 mg  20 mg Oral Daily Attiah, Nadir, MD   20 mg at 10/18/22 0756   gabapentin (NEURONTIN) capsule 600 mg  600 mg Oral TID Abbott Pao, Nadir, MD   600 mg at 10/18/22 1500   hydrOXYzine (ATARAX) tablet 25 mg  25 mg Oral Q6H PRN Attiah, Nadir, MD   25 mg at 10/17/22 1211   insulin aspart (novoLOG) injection 0-15 Units  0-15 Units Subcutaneous TID WC Sindy Guadeloupe, NP   3 Units at 10/18/22 1213   insulin aspart (novoLOG) injection 0-5 Units  0-5 Units Subcutaneous QHS Sindy Guadeloupe, NP   3 Units at 10/15/22 2119   insulin aspart (novoLOG) injection 6 Units  6 Units Subcutaneous TID WC Attiah, Nadir, MD   6 Units at 10/18/22 1214   insulin glargine-yfgn (SEMGLEE) injection 16 Units  16 Units Subcutaneous QHS Abbott Pao, Nadir, MD   16 Units at 10/17/22 2221   linaclotide (LINZESS) capsule 145 mcg  145 mcg Oral QAC breakfast Abbott Pao, Nadir, MD   145 mcg at 10/18/22  0636   magnesium hydroxide (MILK OF MAGNESIA) suspension 30 mL  30 mL Oral Daily PRN Ardis Hughs, NP       nicotine polacrilex (NICORETTE) gum 2 mg  2 mg Oral PRN Abbott Pao, Nadir, MD       OLANZapine zydis (ZYPREXA) disintegrating tablet 5 mg  5 mg Oral Q8H PRN Attiah, Nadir, MD       oxyCODONE-acetaminophen (PERCOCET/ROXICET) 5-325 MG per tablet 1 tablet  1 tablet Oral Q8H PRN Abbott Pao, Nadir, MD   1 tablet at 10/18/22 0800   pantoprazole (PROTONIX) EC tablet 40 mg  40 mg Oral Daily Attiah, Nadir, MD   40 mg at 10/18/22 0756   traZODone (DESYREL) tablet 50 mg  50 mg Oral QHS PRN Ardis Hughs, NP   50 mg at 10/17/22 2118   Lab Results:  Results for orders placed or performed during the hospital encounter of 10/14/22 (from the past 48 hour(s))  Glucose, capillary     Status: Abnormal   Collection Time: 10/16/22  4:43 PM  Result Value Ref Range   Glucose-Capillary 131 (H) 70 - 99 mg/dL    Comment: Glucose reference range applies only to samples taken after  fasting for at least 8 hours.  Glucose, capillary     Status: Abnormal   Collection Time: 10/16/22  9:07 PM  Result Value Ref Range   Glucose-Capillary 180 (H) 70 - 99 mg/dL    Comment: Glucose reference range applies only to samples taken after fasting for at least 8 hours.  Glucose, capillary     Status: Abnormal   Collection Time: 10/17/22  6:25 AM  Result Value Ref Range   Glucose-Capillary 181 (H) 70 - 99 mg/dL    Comment: Glucose reference range applies only to samples taken after fasting for at least 8 hours.  Glucose, capillary     Status: Abnormal   Collection Time: 10/17/22  8:03 AM  Result Value Ref Range   Glucose-Capillary 234 (H) 70 - 99 mg/dL    Comment: Glucose reference range applies only to samples taken after fasting for at least 8 hours.  Glucose, capillary     Status: Abnormal   Collection Time: 10/17/22 12:00 PM  Result Value Ref Range   Glucose-Capillary 282 (H) 70 - 99 mg/dL    Comment: Glucose reference range applies only to samples taken after fasting for at least 8 hours.  Glucose, capillary     Status: Abnormal   Collection Time: 10/17/22  5:18 PM  Result Value Ref Range   Glucose-Capillary 264 (H) 70 - 99 mg/dL    Comment: Glucose reference range applies only to samples taken after fasting for at least 8 hours.  Glucose, capillary     Status: Abnormal   Collection Time: 10/17/22  9:24 PM  Result Value Ref Range   Glucose-Capillary 153 (H) 70 - 99 mg/dL    Comment: Glucose reference range applies only to samples taken after fasting for at least 8 hours.  Glucose, capillary     Status: Abnormal   Collection Time: 10/18/22  6:22 AM  Result Value Ref Range   Glucose-Capillary 191 (H) 70 - 99 mg/dL    Comment: Glucose reference range applies only to samples taken after fasting for at least 8 hours.  Glucose, capillary     Status: Abnormal   Collection Time: 10/18/22 11:57 AM  Result Value Ref Range   Glucose-Capillary 190 (H) 70 - 99 mg/dL    Comment:  Glucose reference range  applies only to samples taken after fasting for at least 8 hours.   Blood Alcohol level:  Lab Results  Component Value Date   ETH <10 06/17/2022   [facility]  12/11/2009    <5        LOWEST DETECTABLE LIMIT FOR SERUM ALCOHOL IS 5 mg/dL FOR MEDICAL PURPOSES ONLY   Metabolic Disorder Labs: Lab Results  Component Value Date   HGBA1C 11.2 (H) 10/15/2022   MPG 275 10/15/2022   MPG 318 02/01/2022   No results found for: "PROLACTIN" Lab Results  Component Value Date   CHOL 274 (H) 10/15/2022   TRIG 86 10/15/2022   HDL 63 10/15/2022   CHOLHDL 4.3 10/15/2022   VLDL 17 10/15/2022   LDLCALC 194 (H) 10/15/2022   LDLCALC 131 (H) 02/01/2022   Physical Findings: AIMS: Facial and Oral Movements Muscles of Facial Expression: None, normal Lips and Perioral Area: None, normal Jaw: None, normal Tongue: None, normal,Extremity Movements Upper (arms, wrists, hands, fingers): None, normal Lower (legs, knees, ankles, toes): None, normal, Trunk Movements Neck, shoulders, hips: None, normal, Overall Severity Severity of abnormal movements (highest score from questions above): None, normal Incapacitation due to abnormal movements: None, normal Patient's awareness of abnormal movements (rate only patient's report): No Awareness, Dental Status Current problems with teeth and/or dentures?: No Does patient usually wear dentures?: No  CIWA:    COWS:     Musculoskeletal: Strength & Muscle Tone: within normal limits Gait & Station: normal Patient leans: N/A  Psychiatric Specialty Exam:  General Appearance: Appears older than stated age, dressed casually, with an average hygiene   Behavior: Cooperative and calm in general   Psychomotor Activity: No psychomotor agitation or retardation noted   Eye Contact: Limited Speech: Normal   Mood: Dysphoric Affect: Restricted sad affect   Thought Process: Linear and goal-directed Descriptions of Associations: Concrete Thought  Content: Hallucinations: Denies AH, VH, does not appear responding to stimuli Delusions: No paranoia  Suicidal Thoughts: Admits to passive SI wishing self dead and active SI with plan to overdose, able to contract for safety in the hospital but not if outside of the hospital Homicidal Thoughts: Denies HI intention or plan   Alertness/Orientation: Alert and oriented   Insight: Limited Judgment: Limited   Memory: intact   Executive Functions  Concentration: Fair Attention Span: Fair Recall: [employer] of Knowledge: Fair  Assets  Assets: Manufacturing systems engineer; Desire for Improvement; Physical Health; Resilience  Physical Exam: Physical Exam Vitals and nursing note reviewed.  HENT:     Head: Normocephalic.     Nose: Nose normal.     Mouth/Throat:     Mouth: Mucous membranes are moist.  Eyes:     Extraocular Movements: Extraocular movements intact.  Cardiovascular:     Rate and Rhythm: Tachycardia present.  Pulmonary:     Effort: Pulmonary effort is normal.  Abdominal:     Comments: Deferred  Genitourinary:    Comments: Deferred Musculoskeletal:        General: Normal range of motion.     Cervical back: Normal range of motion.  Skin:    General: Skin is warm.     Findings: No rash.  Neurological:     General: No focal deficit present.     Mental Status: She is alert and oriented to person, place, and time.  Psychiatric:        Mood and Affect: Mood normal.        Behavior: Behavior normal.  Thought Content: Thought content normal.    Review of Systems  Constitutional: Negative.   HENT: Negative.    Eyes: Negative.   Respiratory: Negative.    Cardiovascular: Negative.   Gastrointestinal:  Negative for heartburn, nausea and vomiting.  Genitourinary: Negative.   Musculoskeletal: Negative.   Skin: Negative.  Negative for rash.  Neurological: Negative.  Negative for dizziness.  Psychiatric/Behavioral:  Positive for depression. Negative for hallucinations,  memory loss and suicidal ideas. The patient is nervous/anxious.   All other systems reviewed and are negative.  Blood pressure 123/87, pulse (!) 107, temperature 98.6 F (37 C), temperature source Oral, resp. rate 16, height 5\' 4"  (1.626 m), weight 62 kg, SpO2 100%. Body mass index is 23.45 kg/m.  Treatment Plan Summary: Daily contact with patient to assess and evaluate symptoms and progress in treatment and Medication management  Diagnoses / Active Problems: Principal Problem: Major depressive disorder, recurrent severe without psychotic features (HCC) Diagnosis: Principal Problem:   Major depressive disorder, recurrent severe without psychotic features (HCC) Active Problems:   Moderate cocaine use disorder (HCC)   Cannabis use disorder  PLAN:  Safety and Monitoring:             -- Involuntary admission to inpatient psychiatric unit for safety, stabilization and treatment             -- Daily contact with patient to assess and evaluate symptoms and progress in treatment             -- Patient's case to be discussed in multi-disciplinary team meeting             -- Observation Level : q15 minute checks             -- Vital signs:  q12 hours             -- Precautions: suicide, elopement, and assault   2. Medications:  -Continue Prozac 20 mg daily for management of depression and anxiety -Continue Abilify 10 mg daily  for mood stabilization  -Continue trazodone 50 mg at bedtime as needed for sleep -Continue Atarax 25 mg every 6 hours as needed for anxiety -Continue agitation protocol: Olanzapine disintegrating tabs 5 mg p.o. every 8 hours as needed for agitation   Medications for medical reasons -Continue Percocet 5/325 TID PRN for chronic pain -Continue Linzess 145 mcg daily for IBS (home med) -Continue pantoprazole 40 mg daily for GERD -Continue gabapentin 600 mg TID for  neuropathy -Continue insulin Semglee 16 units nightly for diabetes -Continue insulin Novolog 6 units 3  times daily with meals -Continue insulin NovoLog 0 to 5 units as per sliding scale in the [facility]  --  The risks/benefits/side-effects/alternatives to this medication were discussed in detail with the patient and time was given for questions. The patient consents to medication trial.  -- Metabolic profile and EKG monitoring obtained while on an atypical antipsychotic (BMI: Lipid Panel: HbgA1c: QTc:)    Ordering the following labs: Vitamin D, TSH   3. Per admissions assessment: Labs Reviewed: Lab reviewed from paper chart from [facility] including UDS negative, CBC no abnormalities noted, CMP no significant abnormalities noted EKG 9/17 QTc 453, lipid data shows elevated cholesterol 274 and elevated LDL 194, patient was counseled to check with primary care provider after discharge for consideration of a statin treatment hemoglobin A1c WNL   4. Tobacco Use Disorder             -- Patient refuses NicoDerm patch, was started on  nicotine gum per her request             -- Smoking cessation encouraged   5. Group and Therapy: -- Encouraged patient to participate in unit milieu and in scheduled group therapies              --Substance Use counseling: Patient was counseled regarding need to abstain completely from marijuana and cocaine after discharge.   6. Discharge Planning:              -- Social work and case management to assist with discharge planning and identification of hospital follow-up needs prior to discharge             -- Estimated LOS: 5-7 days             -- Discharge Concerns: Need to establish a safety plan; Medication compliance and effectiveness             -- Discharge Goals: Return home with outpatient referrals for mental health follow-up including medication management/psychotherapy   Physician Treatment Plan for Primary Diagnosis: Major depressive disorder, recurrent severe without psychotic features (HCC) Long Term Goal(s): Improvement in symptoms so as ready for  discharge   Short Term Goals: Ability to identify changes in lifestyle to reduce recurrence of condition will improve, Ability to verbalize feelings will improve, Ability to disclose and discuss suicidal ideas, Ability to demonstrate self-control will improve, and Ability to identify and develop effective coping behaviors will improve     Cecilie Lowers, FNP 10/18/2022, 4:26 PM Patient ID: Sara [name], female   DOB: [birth year], 47 y.o.   MRN: [id number] Patient ID: Sara [name], female   DOB: 09-12-1975, 47 y.o.   MRN: [id number] Patient ID: Sara [name], female   DOB: [birth year], 47 y.o.   MRN: [id number]

## 2022-10-18 NOTE — [facility] Group Notes (Signed)
LCSW Wellness Group Note     10/18/2022 10:00am   Type of Group and Topic: Psychoeducational Group:  Wellness   Participation Level:  active   Description of Group   Wellness group introduces the topic and its focus on developing healthy habits across the spectrum and its relationship to a decrease in hospital admissions.  Six areas of wellness are discussed: physical, social spiritual, intellectual, occupational, and emotional.  Patients are asked to consider their current wellness habits and to identify areas of wellness where they are interested and able to focus on improvements.     Therapeutic Goals Patients will understand components of wellness and how they can positively impact overall health.  Patients will identify areas of wellness where they have developed good habits. Patients will identify areas of wellness where they would like to make improvements.      Summary of Patient Progress: pt attentive in group, did make several comments during group discussion.  Was called out by provider at one point but did return.  Pt identified social and physical as wellness areas of strength.  Pt identified financial as wellness area that needs work.           Therapeutic Modalities: Cognitive Behavioral Therapy Psychoeducation       Lorri Frederick, LCSW

## 2022-10-19 DIAGNOSIS — F332 Major depressive disorder, recurrent severe without psychotic features: Secondary | ICD-10-CM | POA: Diagnosis not present

## 2022-10-19 LAB — GLUCOSE, CAPILLARY
Glucose-Capillary: 146 mg/dL — ABNORMAL HIGH (ref 70–99)
Glucose-Capillary: 173 mg/dL — ABNORMAL HIGH (ref 70–99)
Glucose-Capillary: 213 mg/dL — ABNORMAL HIGH (ref 70–99)
Glucose-Capillary: 115 mg/dL — ABNORMAL HIGH (ref 70–99)

## 2022-10-19 MED ORDER — GLUCERNA SHAKE PO LIQD
237.0000 mL | Freq: Three times a day (TID) | ORAL | Status: DC
  Administered 2022-10-19 – 2022-10-21 (×5): 237 mL via ORAL
  Filled 2022-10-19 (×12): qty 237

## 2022-10-19 NOTE — Progress Notes (Signed)
   10/19/22 1100  Psych Admission Type (Psych Patients Only)  Admission Status Involuntary  Psychosocial Assessment  Patient Complaints None  Eye Contact Fair  Facial Expression Animated  Affect Appropriate to circumstance  Speech Logical/coherent  Interaction Assertive  Motor Activity Slow  Appearance/Hygiene Unremarkable  Behavior Characteristics Cooperative;Appropriate to situation  Mood Pleasant  Thought Process  Coherency WDL  Content WDL  Delusions None reported or observed  Perception WDL  Hallucination None reported or observed  Judgment Poor  Confusion None  Danger to Self  Current suicidal ideation? Denies  Self-Injurious Behavior No self-injurious ideation or behavior indicators observed or expressed   Danger to Others  Danger to Others None reported or observed  Danger to Others Abnormal  Harmful Behavior to others No threats or harm toward other people

## 2022-10-19 NOTE — [facility] Group Notes (Signed)
[facility] Group Notes:  (Nursing/MHT/Case Management/Adjunct)  Date:  10/19/2022  Time:  2015  Type of Therapy:   Wrap up group  Participation Level:  Active  Participation Quality:  Appropriate, Attentive, Sharing, and Supportive  Affect:  Appropriate  Cognitive:  Alert  Insight:  Improving  Engagement in Group:  Engaged  Modes of Intervention:  Clarification, Education, and Support  Summary of Progress/Problems: Positive thinking and positive change were discussed.   Sara [name] 10/19/2022, 10:39 PM

## 2022-10-19 NOTE — Progress Notes (Signed)
[facility] MD Progress Note  10/19/2022 2:53 PM Sara [name]  MRN:  [id number]  Principal Problem:   Major depressive disorder, recurrent severe without psychotic features [facility]) Active Problems:   Moderate cocaine use disorder (HCC)   Cannabis use disorder  Reason for Admission:  Sara [name] is a 47 y.o., female with a past psychiatric history significant for depression and substance use who presents to the [facility] from [facility] under IVC for evaluation and management of worsening depression and SI. According to outside records, the patient presented to [facility] emergency room reporting worsening depression, SI with plan to overdose.The patient is currently on [name] Day 5.   Yesterday the psychiatry team made the following recommendations: -Continue Prozac 20 mg daily for management of depression and anxiety -Continue Abilify 10 mg daily  for mood stabilization  -Continue trazodone 50 mg at bedtime as needed for sleep -Continue Atarax 25 mg every 6 hours as needed for anxiety -Continue agitation protocol: Olanzapine disintegrating tabs 5 mg p.o. every 8 hours as needed for agitation  Today's assessment note: Sara [name] reports that her mood is less depressed today.  She is pleasant with smiles.  She is alert, oriented to person, place, time, and situation.  Able to maintain good eye contact with this provider, and  speech is clear & coherent. Her attention to personal hygiene and grooming is fair, Thought process and contents are organized and logical.  Observed attending therapeutic milieu and unit group activities.  Improvement in CBG values, ranging 100 to 173 today.  Hemoglobin A1c 11.2 on 10/15/2022. Reports that anxiety is at manageable level. Nursing staff report patient sleeping over 8 hours last night.  Patient reports she is restful.   Appetite is good Concentration is better Energy level is adequate Denies suicidal thoughts and denies suicidal intent or  plan while in the [name]. Although patient is denying SI at this time, she is not able to verbally contract for safety outside of the [name] building, and remains at risk of danger to herself if discharged without a proper plan in place for her safety.  We are therefore continuing hospitalization at this time, and continuing medications as below, while patient is continuing to make efforts to get into [facility].  She reports that she has been making efforts and will continue to make calls tomorrow Monday, 10/20/2022.   We are continuing all medications as listed below, and continues on Abilify 10 mg for mood stabilization.  Expected date of discharge is Tuesday, 10/21/2022 and patient is made aware.  No changes in her treatment plan today.  Denies having any HI.  Denies having psychotic symptoms. There is no evidence of delusional thoughts or paranoia.   Denies having side effects to current psychiatric medications.   We discussed compliance to current medication regimen.   Past Psychiatric History:  Prior Psychiatric diagnoses: Reports history of diagnosed bipolar disorder but unable to specify symptoms consistent with mania or hypomania except in the presence of cocaine use Past Psychiatric Hospitalizations: 2 previous hospitalizations to this facility May 2024 for 4 days in June 2024 for 7 days, discharge medications in June included Prozac, Abilify, gabapentin, Percocet, trazodone   History of self mutilation: Denies Past suicide attempts: 2 previous suicide attempts last time years ago by overdose Past history of HI, violent or aggressive behavior: History of getting in fights related to irritability   Past Psychiatric medications trials: History of good response to Prozac and Abilify History of ECT/TMS: Denies  Outpatient psychiatric Follow up: When discharged from [name] in June.  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 pylori infection    High risk medication use    Hypercholesterolemia    Hyperlipidemia, mixed    Hypertension    Other mixed anxiety disorders    Pollen allergies    Sickle cell trait (HCC)    Sleep apnea    Vitamin D deficiency     Past Surgical History:  Procedure Laterality Date   ABDOMINAL HYSTERECTOMY     APPENDECTOMY     COLONOSCOPY  08-16-2008   Dr. Vallarie Mare   ESOPHAGOGASTRODUODENOSCOPY  08-16-2008   Dr. Rayfield Citizen    Family History:  Family History  Problem Relation Age of Onset   Clotting disorder Mother    Crohn's disease Mother    Heart disease Mother    Colon cancer Father    Clotting disorder Brother    Diabetes Brother    Diabetes Maternal Aunt    Liver cancer Maternal Uncle    Prostate cancer Maternal Uncle    Colon cancer Maternal Uncle    Diabetes Paternal Aunt    Esophageal cancer Paternal Uncle    Family Psychiatric  History: See H&P  Social History:  Living situation: Homeless for the past 4 years Social support: Poor social support Marital Status: Was married once, divorced since age 11 years old Children: 2 sons ages 62 and 40 lives in [place] and [place], patient reports being estranged from the Education: 10th grade Employment: Reports last job 2 to 3 months ago 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 Legal history: History of jail time related to fights years ago, history of prison time related to driving without license, years ago Trauma: Denies Access to guns: Denies  Current Medications: Current Facility-Administered Medications  Medication Dose Route Frequency Provider Last Rate Last Admin   alum & mag hydroxide-simeth (MAALOX/MYLANTA) 200-200-20 MG/5ML suspension 30 mL  30 mL Oral Q4H PRN Ardis Hughs, NP        ARIPiprazole (ABILIFY) tablet 10 mg  10 mg Oral Daily Attiah, Nadir, MD   10 mg at 10/19/22 0739   feeding supplement (GLUCERNA SHAKE) (GLUCERNA SHAKE) liquid 237 mL  237 mL Oral TID BM Charlese Gruetzmacher C, FNP   237 mL at 10/19/22 1347   FLUoxetine (PROZAC) capsule 20 mg  20 mg Oral Daily Attiah, Nadir, MD   20 mg at 10/19/22 0739   gabapentin (NEURONTIN) capsule 600 mg  600 mg Oral TID Abbott Pao, Nadir, MD   600 mg at 10/19/22 1357   hydrOXYzine (ATARAX) tablet 25 mg  25 mg Oral Q6H PRN Attiah, Nadir, MD   25 mg at 10/17/22 1211   insulin aspart (novoLOG) injection 0-15 Units  0-15 Units Subcutaneous TID WC Sindy Guadeloupe, NP   3 Units at 10/19/22 1211   insulin aspart (novoLOG) injection 0-5 Units  0-5 Units Subcutaneous QHS Sindy Guadeloupe, NP   3 Units at 10/15/22 2119   insulin aspart (novoLOG) injection 6 Units  6 Units Subcutaneous TID WC Abbott Pao, Nadir, MD   6 Units at 10/19/22 1209   insulin glargine-yfgn (SEMGLEE) injection 16 Units  16 Units Subcutaneous QHS Sarita Bottom, MD  16 Units at 10/18/22 2125   linaclotide (LINZESS) capsule 145 mcg  145 mcg Oral QAC breakfast Abbott Pao, Nadir, MD   145 mcg at 10/18/22 0636   magnesium hydroxide (MILK OF MAGNESIA) suspension 30 mL  30 mL Oral Daily PRN Ardis Hughs, NP       nicotine polacrilex (NICORETTE) gum 2 mg  2 mg Oral PRN Abbott Pao, Nadir, MD       OLANZapine zydis (ZYPREXA) disintegrating tablet 5 mg  5 mg Oral Q8H PRN Attiah, Nadir, MD       oxyCODONE-acetaminophen (PERCOCET/ROXICET) 5-325 MG per tablet 1 tablet  1 tablet Oral Q8H PRN Abbott Pao, Nadir, MD   1 tablet at 10/19/22 0741   pantoprazole (PROTONIX) EC tablet 40 mg  40 mg Oral Daily Attiah, Nadir, MD   40 mg at 10/19/22 0747   traZODone (DESYREL) tablet 50 mg  50 mg Oral QHS PRN Ardis Hughs, NP   50 mg at 10/18/22 2126   Lab Results:  Results for orders placed or performed during the [name] encounter of 10/14/22 (from the past 48 hour(s))  Glucose, capillary     Status: Abnormal    Collection Time: 10/17/22  5:18 PM  Result Value Ref Range   Glucose-Capillary 264 (H) 70 - 99 mg/dL    Comment: Glucose reference range applies only to samples taken after fasting for at least 8 hours.  Glucose, capillary     Status: Abnormal   Collection Time: 10/17/22  9:24 PM  Result Value Ref Range   Glucose-Capillary 153 (H) 70 - 99 mg/dL    Comment: Glucose reference range applies only to samples taken after fasting for at least 8 hours.  Glucose, capillary     Status: Abnormal   Collection Time: 10/18/22  6:22 AM  Result Value Ref Range   Glucose-Capillary 191 (H) 70 - 99 mg/dL    Comment: Glucose reference range applies only to samples taken after fasting for at least 8 hours.  Glucose, capillary     Status: Abnormal   Collection Time: 10/18/22 11:57 AM  Result Value Ref Range   Glucose-Capillary 190 (H) 70 - 99 mg/dL    Comment: Glucose reference range applies only to samples taken after fasting for at least 8 hours.  Glucose, capillary     Status: Abnormal   Collection Time: 10/18/22  4:56 PM  Result Value Ref Range   Glucose-Capillary 162 (H) 70 - 99 mg/dL    Comment: Glucose reference range applies only to samples taken after fasting for at least 8 hours.  Glucose, capillary     Status: Abnormal   Collection Time: 10/18/22  8:04 PM  Result Value Ref Range   Glucose-Capillary 100 (H) 70 - 99 mg/dL    Comment: Glucose reference range applies only to samples taken after fasting for at least 8 hours.  Glucose, capillary     Status: Abnormal   Collection Time: 10/19/22  6:16 AM  Result Value Ref Range   Glucose-Capillary 146 (H) 70 - 99 mg/dL    Comment: Glucose reference range applies only to samples taken after fasting for at least 8 hours.  Glucose, capillary     Status: Abnormal   Collection Time: 10/19/22 11:58 AM  Result Value Ref Range   Glucose-Capillary 173 (H) 70 - 99 mg/dL    Comment: Glucose reference range applies only to samples taken after fasting for  at least 8 hours.   Blood Alcohol level:  Lab Results  Component Value  Date   ETH <10 06/17/2022   ETH  12/11/2009    <5        LOWEST DETECTABLE LIMIT FOR SERUM ALCOHOL IS 5 mg/dL FOR MEDICAL PURPOSES ONLY   Metabolic Disorder Labs: Lab Results  Component Value Date   HGBA1C 11.2 (H) 10/15/2022   MPG 275 10/15/2022   MPG 318 02/01/2022   No results found for: "PROLACTIN" Lab Results  Component Value Date   CHOL 274 (H) 10/15/2022   TRIG 86 10/15/2022   HDL 63 10/15/2022   CHOLHDL 4.3 10/15/2022   VLDL 17 10/15/2022   LDLCALC 194 (H) 10/15/2022   LDLCALC 131 (H) 02/01/2022   Physical Findings: AIMS: Facial and Oral Movements Muscles of Facial Expression: None, normal Lips and Perioral Area: None, normal Jaw: None, normal Tongue: None, normal,Extremity Movements Upper (arms, wrists, hands, fingers): None, normal Lower (legs, knees, ankles, toes): None, normal, Trunk Movements Neck, shoulders, hips: None, normal, Overall Severity Severity of abnormal movements (highest score from questions above): None, normal Incapacitation due to abnormal movements: None, normal Patient's awareness of abnormal movements (rate only patient's report): No Awareness, Dental Status Current problems with teeth and/or dentures?: No Does patient usually wear dentures?: No  CIWA:    COWS:     Musculoskeletal: Strength & Muscle Tone: within normal limits Gait & Station: normal Patient leans: N/A  Psychiatric Specialty Exam:  General Appearance: Appears older than stated age, dressed casually, with an average hygiene   Behavior: Cooperative and calm in general   Psychomotor Activity: No psychomotor agitation or retardation noted   Eye Contact: Limited Speech: Normal   Mood: Dysphoric Affect: Restricted sad affect   Thought Process: Linear and goal-directed Descriptions of Associations: Concrete Thought Content: Hallucinations: Denies AH, VH, does not appear responding to  stimuli Delusions: No paranoia  Suicidal Thoughts: Admits to passive SI wishing self dead and active SI with plan to overdose, able to contract for safety in the [name] but not if outside of the [name] Homicidal Thoughts: Denies HI intention or plan   Alertness/Orientation: Alert and oriented   Insight: Limited Judgment: Limited   Memory: intact   Executive Functions  Concentration: Fair Attention Span: Fair Recall: [employer] of Knowledge: Fair  Assets  Assets: Manufacturing systems engineer; Desire for Improvement; Physical Health; Resilience  Physical Exam: Physical Exam Vitals and nursing note reviewed.  HENT:     Head: Normocephalic.     Nose: Nose normal.     Mouth/Throat:     Mouth: Mucous membranes are moist.  Eyes:     Extraocular Movements: Extraocular movements intact.  Cardiovascular:     Rate and Rhythm: Tachycardia present.  Pulmonary:     Effort: Pulmonary effort is normal.  Abdominal:     Comments: Deferred  Genitourinary:    Comments: Deferred Musculoskeletal:        General: Normal range of motion.     Cervical back: Normal range of motion.  Skin:    General: Skin is warm.     Findings: No rash.  Neurological:     General: No focal deficit present.     Mental Status: She is alert and oriented to person, place, and time.  Psychiatric:        Mood and Affect: Mood normal.        Behavior: Behavior normal.        Thought Content: Thought content normal.    Review of Systems  Constitutional: Negative.   HENT: Negative.    Eyes:  Negative.   Respiratory: Negative.    Cardiovascular: Negative.   Gastrointestinal:  Negative for heartburn, nausea and vomiting.  Genitourinary: Negative.   Musculoskeletal: Negative.   Skin: Negative.  Negative for rash.  Neurological: Negative.  Negative for dizziness.  Psychiatric/Behavioral:  Positive for depression. Negative for hallucinations, memory loss and suicidal ideas. The patient is nervous/anxious.   All  other systems reviewed and are negative.  Blood pressure 115/77, pulse (!) 110, temperature 98.3 F (36.8 C), temperature source Oral, resp. rate 17, height 5\' 4"  (1.626 m), weight 62 kg, SpO2 98%. Body mass index is 23.45 kg/m.  Treatment Plan Summary: Daily contact with patient to assess and evaluate symptoms and progress in treatment and Medication management  Diagnoses / Active Problems: Principal Problem: Major depressive disorder, recurrent severe without psychotic features (HCC) Diagnosis: Principal Problem:   Major depressive disorder, recurrent severe without psychotic features (HCC) Active Problems:   Moderate cocaine use disorder (HCC)   Cannabis use disorder  PLAN:  Safety and Monitoring:             -- Involuntary admission to inpatient psychiatric unit for safety, stabilization and treatment             -- Daily contact with patient to assess and evaluate symptoms and progress in treatment             -- Patient's case to be discussed in multi-disciplinary team meeting             -- Observation Level : q15 minute checks             -- Vital signs:  q12 hours             -- Precautions: suicide, elopement, and assault   2. Medications:  -Continue Prozac 20 mg daily for management of depression and anxiety -Continue Abilify 10 mg daily  for mood stabilization  -Continue trazodone 50 mg at bedtime as needed for sleep -Continue Atarax 25 mg every 6 hours as needed for anxiety -Continue agitation protocol: Olanzapine disintegrating tabs 5 mg p.o. every 8 hours as needed for agitation   Medications for medical reasons -Continue Percocet 5/325 TID PRN for chronic pain -Continue Linzess 145 mcg daily for IBS (home med) -Continue pantoprazole 40 mg daily for GERD -Continue gabapentin 600 mg TID for  neuropathy -Continue insulin Semglee 16 units nightly for diabetes -Continue insulin Novolog 6 units 3 times daily with meals -Continue insulin NovoLog 0 to 5 units as per  sliding scale in the [name]  --  The risks/benefits/side-effects/alternatives to this medication were discussed in detail with the patient and time was given for questions. The patient consents to medication trial.  -- Metabolic profile and EKG monitoring obtained while on an atypical antipsychotic (BMI: Lipid Panel: HbgA1c: QTc:)    Ordering the following labs: Vitamin D, TSH   3. Per admissions assessment: Labs Reviewed: Lab reviewed from paper chart from [facility] including UDS negative, CBC no abnormalities noted, CMP no significant abnormalities noted EKG 9/17 QTc 453, lipid data shows elevated cholesterol 274 and elevated LDL 194, patient was counseled to check with primary care provider after discharge for consideration of a statin treatment hemoglobin A1c WNL   4. Tobacco Use Disorder             -- Patient refuses NicoDerm patch, was started on nicotine gum per her request             -- Smoking cessation encouraged  5. Group and Therapy: -- Encouraged patient to participate in unit milieu and in scheduled group therapies              --Substance Use counseling: Patient was counseled regarding need to abstain completely from marijuana and cocaine after discharge.   6. Discharge Planning:              -- Social work and case management to assist with discharge planning and identification of [name] follow-up needs prior to discharge             -- Estimated LOS: 5-7 days             -- Discharge Concerns: Need to establish a safety plan; Medication compliance and effectiveness             -- Discharge Goals: Return home with outpatient referrals for mental health follow-up including medication management/psychotherapy   Physician Treatment Plan for Primary Diagnosis: Major depressive disorder, recurrent severe without psychotic features (HCC) Long Term Goal(s): Improvement in symptoms so as ready for discharge   Short Term Goals: Ability to identify changes in lifestyle to  reduce recurrence of condition will improve, Ability to verbalize feelings will improve, Ability to disclose and discuss suicidal ideas, Ability to demonstrate self-control will improve, and Ability to identify and develop effective coping behaviors will improve     Cecilie Lowers, FNP 10/19/2022, 2:53 PM Patient ID: [name], female   DOB: Mar 06, 1975, 47 y.o.   MRN: [id number] Patient ID: EMARIA SCHEIB, female   DOB: 1975-10-02, 47 y.o.   MRN: [id number] Patient ID: JANAII MONTAGNA, female   DOB: [birth year], 47 y.o.   MRN: [id number] Patient ID: JAZMERE STAILEY, female   DOB: February 14, 1975, 47 y.o.   MRN: [id number]

## 2022-10-19 NOTE — Progress Notes (Signed)
   10/19/22 0157  Psych Admission Type (Psych Patients Only)  Admission Status Involuntary  Psychosocial Assessment  Patient Complaints None  Eye Contact Fair  Facial Expression Animated  Affect Appropriate to circumstance  Speech Logical/coherent  Interaction Assertive  Motor Activity Slow  Appearance/Hygiene Unremarkable  Behavior Characteristics Cooperative;Appropriate to situation  Mood Pleasant  Thought Process  Coherency WDL  Content WDL  Delusions None reported or observed  Perception WDL  Hallucination None reported or observed  Judgment Poor  Confusion None  Danger to Self  Current suicidal ideation? Denies  Description of Agreement verbal  Danger to Others  Danger to Others Reported or observed  Danger to Others Abnormal  Harmful Behavior to others No threats or harm toward other people

## 2022-10-19 NOTE — Plan of Care (Signed)
Problem: Activity: Goal: Interest or engagement in activities will improve Outcome: Progressing Goal: Sleeping patterns will improve Outcome: Progressing

## 2022-10-19 NOTE — BHH Group Notes (Signed)
Pt did not attend goals group. 

## 2022-10-20 ENCOUNTER — Encounter (HOSPITAL_COMMUNITY): Payer: Self-pay

## 2022-10-20 DIAGNOSIS — F332 Major depressive disorder, recurrent severe without psychotic features: Secondary | ICD-10-CM | POA: Diagnosis not present

## 2022-10-20 LAB — GLUCOSE, CAPILLARY
Glucose-Capillary: 165 mg/dL — ABNORMAL HIGH (ref 70–99)
Glucose-Capillary: 216 mg/dL — ABNORMAL HIGH (ref 70–99)
Glucose-Capillary: 182 mg/dL — ABNORMAL HIGH (ref 70–99)
Glucose-Capillary: 130 mg/dL — ABNORMAL HIGH (ref 70–99)

## 2022-10-20 MED ORDER — METOPROLOL SUCCINATE 12.5 MG HALF TABLET
12.5000 mg | ORAL_TABLET | Freq: Every day | ORAL | Status: DC
  Administered 2022-10-20 – 2022-10-21 (×2): 12.5 mg via ORAL
  Filled 2022-10-20 (×4): qty 1

## 2022-10-20 NOTE — Progress Notes (Signed)
[facility] MD Progress Note  10/20/2022 9:54 AM KATILAYA FROSS  MRN:  [id number]   Reason for Admission:  JOVEY KANTOLA is a 47 y.o., female with a past psychiatric history significant for depression and substance use who presents to the [facility] from [facility] under IVC for evaluation and management of worsening depression and SI.  According to outside records, the patient presented to [facility] emergency room reporting worsening depression, SI with plan to overdose.The patient is currently on Hospital Day 6.   Chart Review from last 24 hours:  The patient's chart was reviewed and nursing notes were reviewed. The patient's case was discussed in multidisciplinary team meeting. Per [facility] patient using as needed Atarax for anxiety on and off with last use 9/20, using Percocet as needed for pain average 2-3 times daily, using trazodone every night for sleep with good efficacy reported.  Information Obtained Today During Patient Interview: Patient was evaluated in her room this morning she continues to report feeling better with less depressed mood and anxiety feeling safe mainly in the hospital but continues to feeling hopeless and helpless regarding her situation being homeless, she is able to contract for safety in the hospital but also reports wishing herself dead and unable to ensure safety if having no place to go at time of discharge.  She has been attempting to contact multiple halfway houses with minimal luck at this time, will continue to explore and follow.  She denies side effect to current medication regimen and agrees to comply but reports difficulty complying with insulin after discharge given she is homeless and has no easy access to refrigerator if discharged to the shelter, after contacting diabetes coordinator team who is following the patient in the hospital they confirmed that given her uncontrolled diabetes at time of admission she will need to be discharged on  insulin, will follow.  Patient's blood pressure was elevated this morning prior to hospitalization she has been on and off on metoprolol, will restart and monitor.  Patient denies active SI intention or plan, denies HI or AVH, reports fair sleep and appetite while in the hospital.  She denies craving to illicit drugs and agrees to comply with recommendation to continue abstaining after discharge but notes "it is difficult I know I will relapse if I go to the shelter" Sleep  Good sleep reported by patient, improved since admission  Principal Problem: Major depressive disorder, recurrent severe without psychotic features (HCC) Diagnosis: Principal Problem:   Major depressive disorder, recurrent severe without psychotic features (HCC) Active Problems:   Moderate cocaine use disorder (HCC)   Cannabis use disorder    Past Psychiatric History:  Prior Psychiatric diagnoses: Reports history of diagnosed bipolar disorder but unable to specify symptoms consistent with mania or hypomania except in the presence of cocaine use Past Psychiatric Hospitalizations: 2 previous hospitalizations to this facility May 2024 for 4 days in June 2024 for 7 days, discharge medications in June included Prozac, Abilify, gabapentin, Percocet, trazodone   History of self mutilation: Denies Past suicide attempts: 2 previous suicide attempts last time years ago by overdose Past history of HI, violent or aggressive behavior: History of getting in fights related to irritability   Past Psychiatric medications trials: History of good response to Prozac and Abilify History of ECT/TMS: Denies   Outpatient psychiatric Follow up: When discharged from [facility] in June.  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 pylori infection    High risk medication use    Hypercholesterolemia    Hyperlipidemia, mixed    Hypertension    Other mixed anxiety disorders    Pollen allergies    Sickle cell trait (HCC)    Sleep apnea    Vitamin D deficiency     Past Surgical History:  Procedure Laterality Date   ABDOMINAL HYSTERECTOMY     APPENDECTOMY     COLONOSCOPY  08-16-2008   Dr. Vallarie Mare   ESOPHAGOGASTRODUODENOSCOPY  08-16-2008   Dr. Rayfield Citizen    Family History:  Family History  Problem Relation Age of Onset   Clotting disorder Mother    Crohn's disease Mother    Heart disease Mother    Colon cancer Father    Clotting disorder Brother    Diabetes Brother    Diabetes Maternal Aunt    Liver cancer Maternal Uncle    Prostate cancer Maternal Uncle    Colon cancer Maternal Uncle    Diabetes Paternal Aunt    Esophageal cancer Paternal Uncle    Family Psychiatric  History:  Psychiatric illness: Reports mother and uncle had bipolar disorder Suicide: Reports mother had history of suicide attempts Substance Abuse: Unknown  Social History:  Living situation: Homeless for the past 4 years Social support: Poor social support Marital Status: Was married once, divorced since age 19 years old Children: 2 sons ages 50 and 58 lives in [place] and [place], patient reports being estranged from the Education: 10th grade Employment: Reports last job 2 to 3 months ago 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 Legal history: History of jail time related to fights years ago, history of prison time related to driving without license, years ago Trauma: Denies Access to guns: Denies  Current Medications: Current Facility-Administered Medications  Medication Dose Route Frequency Provider Last Rate Last Admin   alum & mag hydroxide-simeth (MAALOX/MYLANTA) 200-200-20 MG/5ML suspension 30 mL  30 mL Oral Q4H PRN Ardis Hughs, NP       ARIPiprazole (ABILIFY) tablet 10  mg  10 mg Oral Daily Gurdeep Keesey, MD   10 mg at 10/20/22 0813   feeding supplement (GLUCERNA SHAKE) (GLUCERNA SHAKE) liquid 237 mL  237 mL Oral TID BM Ntuen, Tina C, FNP   237 mL at 10/19/22 1347   FLUoxetine (PROZAC) capsule 20 mg  20 mg Oral Daily Tyrion Glaude, MD   20 mg at 10/20/22 0813   gabapentin (NEURONTIN) capsule 600 mg  600 mg Oral TID Abbott Pao, Analisse Randle, MD   600 mg at 10/20/22 0813   hydrOXYzine (ATARAX) tablet 25 mg  25 mg Oral Q6H PRN Elyse Prevo, MD   25 mg at 10/17/22 1211   insulin aspart (novoLOG) injection 0-15 Units  0-15 Units Subcutaneous TID WC Sindy Guadeloupe, NP   5 Units at 10/19/22 1718   insulin aspart (novoLOG) injection 0-5 Units  0-5 Units Subcutaneous QHS Sindy Guadeloupe, NP   3 Units at 10/15/22 2119   insulin aspart (novoLOG) injection 6 Units  6 Units Subcutaneous TID WC Abbott Pao, Angela Vazguez, MD   6 Units at 10/20/22 0615   insulin glargine-yfgn (SEMGLEE) injection 16 Units  16 Units Subcutaneous QHS Abbott Pao, Shaquille Murdy, MD   16 Units at 10/19/22 2102   linaclotide (LINZESS) capsule 145 mcg  145 mcg Oral QAC breakfast Abbott Pao, Rodricus Candelaria, MD   145 mcg at 10/18/22 0636   magnesium hydroxide (MILK OF MAGNESIA) suspension 30 mL  30 mL Oral Daily PRN Ardis Hughs, NP       metoprolol succinate (TOPROL-XL) 24 hr tablet 12.5 mg  12.5 mg Oral Daily Gad Aymond, MD       nicotine polacrilex (NICORETTE) gum 2 mg  2 mg Oral PRN Abbott Pao, Paarth Cropper, MD       OLANZapine zydis (ZYPREXA) disintegrating tablet 5 mg  5 mg Oral Q8H PRN Yelina Sarratt, MD       oxyCODONE-acetaminophen (PERCOCET/ROXICET) 5-325 MG per tablet 1 tablet  1 tablet Oral Q8H PRN Abbott Pao, Camile Esters, MD   1 tablet at 10/20/22 0814   pantoprazole (PROTONIX) EC tablet 40 mg  40 mg Oral Daily Tameron Lama, MD   40 mg at 10/20/22 0813   traZODone (DESYREL) tablet 50 mg  50 mg Oral QHS PRN Ardis Hughs, NP   50 mg at 10/19/22 2102    Lab Results:  Results for orders placed or performed during the hospital encounter of 10/14/22  (from the past 48 hour(s))  Glucose, capillary     Status: Abnormal   Collection Time: 10/18/22 11:57 AM  Result Value Ref Range   Glucose-Capillary 190 (H) 70 - 99 mg/dL    Comment: Glucose reference range applies only to samples taken after fasting for at least 8 hours.  Glucose, capillary     Status: Abnormal   Collection Time: 10/18/22  4:56 PM  Result Value Ref Range   Glucose-Capillary 162 (H) 70 - 99 mg/dL    Comment: Glucose reference range applies only to samples taken after fasting for at least 8 hours.  Glucose, capillary     Status: Abnormal   Collection Time: 10/18/22  8:04 PM  Result Value Ref Range   Glucose-Capillary 100 (H) 70 - 99 mg/dL    Comment: Glucose reference range applies only to samples taken after fasting for at least 8 hours.  Glucose, capillary     Status: Abnormal   Collection Time: 10/19/22  6:16 AM  Result Value Ref Range   Glucose-Capillary 146 (H) 70 - 99 mg/dL    Comment: Glucose reference range applies only to samples taken after fasting for at least 8 hours.  Glucose, capillary     Status: Abnormal   Collection Time: 10/19/22 11:58 AM  Result Value Ref Range   Glucose-Capillary 173 (H) 70 - 99 mg/dL    Comment: Glucose reference range applies only to samples taken after fasting for at least 8 hours.  Glucose, capillary     Status: Abnormal   Collection Time: 10/19/22  5:05 PM  Result Value Ref Range   Glucose-Capillary 213 (H) 70 - 99 mg/dL    Comment: Glucose reference range applies only to samples taken after fasting for at least 8 hours.  Glucose, capillary     Status: Abnormal   Collection Time: 10/19/22  8:14 PM  Result Value Ref Range   Glucose-Capillary 115 (H) 70 - 99 mg/dL    Comment: Glucose reference range applies only to samples taken after fasting for at least 8 hours.  Glucose, capillary     Status: Abnormal   Collection Time: 10/20/22  6:15 AM  Result Value Ref Range   Glucose-Capillary 165 (H) 70 - 99 mg/dL    Comment:  Glucose reference range applies only to samples taken after fasting for at least 8 hours.  Blood Alcohol level:  Lab Results  Component Value Date   ETH <10 06/17/2022   [facility]  12/11/2009    <5        LOWEST DETECTABLE LIMIT FOR SERUM ALCOHOL IS 5 mg/dL FOR MEDICAL PURPOSES ONLY    Metabolic Disorder Labs: Lab Results  Component Value Date   HGBA1C 11.2 (H) 10/15/2022   MPG 275 10/15/2022   MPG 318 02/01/2022   No results found for: "PROLACTIN" Lab Results  Component Value Date   CHOL 274 (H) 10/15/2022   TRIG 86 10/15/2022   HDL 63 10/15/2022   CHOLHDL 4.3 10/15/2022   VLDL 17 10/15/2022   LDLCALC 194 (H) 10/15/2022   LDLCALC 131 (H) 02/01/2022    Physical Findings: AIMS: Facial and Oral Movements Muscles of Facial Expression: None, normal Lips and Perioral Area: None, normal Jaw: None, normal Tongue: None, normal,Extremity Movements Upper (arms, wrists, hands, fingers): None, normal Lower (legs, knees, ankles, toes): None, normal, Trunk Movements Neck, shoulders, hips: None, normal, Overall Severity Severity of abnormal movements (highest score from questions above): None, normal Incapacitation due to abnormal movements: None, normal Patient's awareness of abnormal movements (rate only patient's report): No Awareness, Dental Status Current problems with teeth and/or dentures?: No Does patient usually wear dentures?: No  CIWA:    COWS:     Musculoskeletal: Strength & Muscle Tone: within normal limits Gait & Station: normal Patient leans: N/A  Psychiatric Specialty Exam:  General Appearance: Appears older than stated age, dressed casually, with an average hygiene   Behavior: Cooperative and calm in general   Psychomotor Activity: No psychomotor agitation or retardation noted   Eye Contact: Limited Speech: Normal   Mood: Euthymic primarily but getting dysphoric as she talks about her stressors Affect: Congruent   Thought Process: Linear and  goal-directed Descriptions of Associations: Concrete Thought Content: Hallucinations: Denies AH, VH, does not appear responding to stimuli Delusions: No paranoia  Suicidal Thoughts: Admits to passive SI wishing self dead condition to the fact of being homeless, denies any current active SI intention or plan, able to contract for safety in the hospital but not if outside of the hospital secondary to increased stressors. Homicidal Thoughts: Denies HI intention or plan   Alertness/Orientation: Alert and oriented   Insight: Improved gait limited Judgment: Limited   Memory: intact   Executive Functions  Concentration: Fair Attention Span: Fair Recall: [employer] of Knowledge: Fair   Assets  Assets: Manufacturing systems engineer; Desire for Improvement; Physical Health; Resilience    Physical Exam: Physical Exam Vitals and nursing note reviewed.    Review of Systems  Gastrointestinal:  Positive for constipation.  All other systems reviewed and are negative.  Blood pressure (!) 154/97, pulse (!) 107, temperature 98.6 F (37 C), temperature source Oral, resp. rate 18, height 5\' 4"  (1.626 m), weight 62 kg, SpO2 100%. Body mass index is 23.45 kg/m.   Treatment Plan Summary: Daily contact with patient to assess and evaluate symptoms and progress in treatment and Medication management  ASSESSMENT:  Diagnoses / Active Problems: Principal Problem: Major depressive disorder, recurrent severe without psychotic features (HCC) Diagnosis: Principal Problem:   Major depressive disorder, recurrent severe without psychotic features (HCC) Active Problems:   Moderate cocaine use disorder (HCC)   Cannabis use disorder   PLAN:  Safety and Monitoring:             -- Involuntary admission to inpatient psychiatric unit for safety, stabilization and treatment             --  Daily contact with patient to assess and evaluate symptoms and progress in treatment             -- Patient's case to be  discussed in multi-disciplinary team meeting             -- Observation Level : q15 minute checks             -- Vital signs:  q12 hours             -- Precautions: suicide, elopement, and assault   2. Medications:             Continue Prozac 20 mg daily for management of depression and anxiety Continue Abilify 10 mg daily, last adjusted 9/19             continue gabapentin 600 mg 3 times daily for neuropathic pain, home medication             Continue pantoprazole 40 mg daily for GERD, patient was on omeprazole 20 mg daily home medication             Continue current insulin per recommendation from diabetes coordinated, they recommend at time of discharge for patient to be discharged on insulin 70/30 10 units twice daily, to follow.             Continue Percocet 5/325 3 times daily as needed for chronic pain, patient was on 10/325 3 times daily as needed for pain as home medication   Continue to monitor blood pressure and consider starting hydrochlorothiazide 25 mg daily if elevated blood pressure noted, please note at emergency room patient was noted to have what elevated blood pressure reading required hydrochlorothiazide treatment, will follow.  Continue Linzess 145 mcg daily for irritable bowel syndrome, patient reports was on it until a month ago for IBS but stopped after ran out secondary to lack of compliance with outpatient follow-up  Start Toprol-XL 12.5 mg daily for hypertension, patient was on at home on and off  Continue trazodone 50 mg at bedtime as needed for sleep  Continue Atarax 25 mg every 6 hours as needed for anxiety   --  The risks/benefits/side-effects/alternatives to this medication were discussed in detail with the patient and time was given for questions. The patient consents to medication trial.                -- Metabolic profile and EKG monitoring obtained while on an atypical antipsychotic (BMI: Lipid Panel: HbgA1c: QTc:)    3. Labs Reviewed: Lab reviewed from  paper chart from [facility] including UDS negative, CBC no abnormalities noted, CMP no significant abnormalities noted EKG 9/17 QTc 453, lipid data shows elevated cholesterol 274 and elevated LDL 194, patient was counseled to check with primary care provider after discharge for consideration of a statin treatment Hemoglobin A1c 11.2 10/15/2022     Lab ordered: None   4. Tobacco Use Disorder             -- Patient refuses NicoDerm patch, was started on nicotine gum per her request             -- Smoking cessation encouraged   5. Group and Therapy: -- Encouraged patient to participate in unit milieu and in scheduled group therapies              --Substance Use counseling: Patient was counseled regarding need to abstain completely from marijuana and cocaine after discharge.   6. Discharge Planning:              --  Social work and case management to assist with discharge planning and identification of hospital follow-up needs prior to discharge             -- Estimated LOS: 5-7 days             -- Discharge Concerns: Need to establish a safety plan; Medication compliance and effectiveness             -- Discharge Goals: Return home with outpatient referrals for mental health follow-up including medication management/psychotherapy   Patient main stress related to being homeless, ongoing substance use problem including marijuana and cocaine, patient is interested in contacting halfway houses, will follow.   The patient is agreeable with the medication plan, as above. We will monitor the patient's response to pharmacologic treatment, and adjust medications as necessary. Patient is encouraged to participate in group therapy while admitted to the psychiatric unit. We will address other chronic and acute stressors, which contributed to the patient's worsening depression and SI, in order to reduce the risk of self-harm at discharge.     Physician Treatment Plan for Primary Diagnosis: Major  depressive disorder, recurrent severe without psychotic features (HCC) Long Term Goal(s): Improvement in symptoms so as ready for discharge   Short Term Goals: Ability to identify changes in lifestyle to reduce recurrence of condition will improve, Ability to verbalize feelings will improve, Ability to disclose and discuss suicidal ideas, Ability to demonstrate self-control will improve, and Ability to identify and develop effective coping behaviors will improve     Total Time Spent in Direct Patient Care:  I personally spent 35 minutes on the unit in direct patient care. The direct patient care time included face-to-face time with the patient, reviewing the patient's chart, communicating with other professionals, and coordinating care. Greater than 50% of this time was spent in counseling or coordinating care with the patient regarding goals of hospitalization, psycho-education, and discharge planning needs.   Braylei Totino Abbott Pao, MD 10/20/2022, 9:54 AM

## 2022-10-20 NOTE — [facility] Group Notes (Signed)
Spiritual care group on grief and loss facilitated by Chaplain Dyanne Carrel, Bcc and Arlyce Dice, Mdiv  Group Goal: Support / Education around grief and loss  Members engage in facilitated group support and psycho-social education.  Group Description:  Following introductions and group rules, group members engaged in facilitated group dialogue and support around topic of loss, with particular support around experiences of loss in their lives. Group Identified types of loss (relationships / self / things) and identified patterns, circumstances, and changes that precipitate losses. Reflected on thoughts / feelings around loss, normalized grief responses, and recognized variety in grief experience. Group encouraged individual reflection on safe space and on the coping skills that they are already utilizing.  Group drew on Adlerian / Rogerian and narrative framework  Patient Progress: Did not attend.

## 2022-10-20 NOTE — BH IP Treatment Plan (Signed)
Interdisciplinary Treatment and Diagnostic Plan Update  10/20/2022 Time of Session: 9:10am (UPDATE) Sara [name] MRN: [id number]  Principal Diagnosis: Major depressive disorder, recurrent severe without psychotic features (HCC)  Secondary Diagnoses: Principal Problem:   Major depressive disorder, recurrent severe without psychotic features (HCC) Active Problems:   Moderate cocaine use disorder (HCC)   Cannabis use disorder   Current Medications:  Current Facility-Administered Medications  Medication Dose Route Frequency Provider Last Rate Last Admin   alum & mag hydroxide-simeth (MAALOX/MYLANTA) 200-200-20 MG/5ML suspension 30 mL  30 mL Oral Q4H PRN Ardis Hughs, NP       ARIPiprazole (ABILIFY) tablet 10 mg  10 mg Oral Daily Attiah, Nadir, MD   10 mg at 10/20/22 0813   feeding supplement (GLUCERNA SHAKE) (GLUCERNA SHAKE) liquid 237 mL  237 mL Oral TID BM Ntuen, Tina C, FNP   237 mL at 10/19/22 1347   FLUoxetine (PROZAC) capsule 20 mg  20 mg Oral Daily Attiah, Nadir, MD   20 mg at 10/20/22 0813   gabapentin (NEURONTIN) capsule 600 mg  600 mg Oral TID Abbott Pao, Nadir, MD   600 mg at 10/20/22 0813   hydrOXYzine (ATARAX) tablet 25 mg  25 mg Oral Q6H PRN Abbott Pao, Nadir, MD   25 mg at 10/17/22 1211   insulin aspart (novoLOG) injection 0-15 Units  0-15 Units Subcutaneous TID WC Sindy Guadeloupe, NP   5 Units at 10/19/22 1718   insulin aspart (novoLOG) injection 0-5 Units  0-5 Units Subcutaneous QHS Sindy Guadeloupe, NP   3 Units at 10/15/22 2119   insulin aspart (novoLOG) injection 6 Units  6 Units Subcutaneous TID WC Attiah, Nadir, MD   6 Units at 10/20/22 0615   insulin glargine-yfgn (SEMGLEE) injection 16 Units  16 Units Subcutaneous QHS Attiah, Nadir, MD   16 Units at 10/19/22 2102   linaclotide (LINZESS) capsule 145 mcg  145 mcg Oral QAC breakfast Abbott Pao, Nadir, MD   145 mcg at 10/18/22 0636   magnesium hydroxide (MILK OF MAGNESIA) suspension 30 mL  30 mL Oral Daily PRN Ardis Hughs,  NP       nicotine polacrilex (NICORETTE) gum 2 mg  2 mg Oral PRN Abbott Pao, Nadir, MD       OLANZapine zydis (ZYPREXA) disintegrating tablet 5 mg  5 mg Oral Q8H PRN Attiah, Nadir, MD       oxyCODONE-acetaminophen (PERCOCET/ROXICET) 5-325 MG per tablet 1 tablet  1 tablet Oral Q8H PRN Abbott Pao, Nadir, MD   1 tablet at 10/20/22 0814   pantoprazole (PROTONIX) EC tablet 40 mg  40 mg Oral Daily Attiah, Nadir, MD   40 mg at 10/20/22 0813   traZODone (DESYREL) tablet 50 mg  50 mg Oral QHS PRN Ardis Hughs, NP   50 mg at 10/19/22 2102   PTA Medications: Medications Prior to Admission  Medication Sig Dispense Refill Last Dose   albuterol (VENTOLIN HFA) 108 (90 Base) MCG/ACT inhaler Inhale 1-2 puffs into the lungs every 6 (six) hours as needed for wheezing or shortness of breath.      diphenhydrAMINE (BENADRYL) 25 mg capsule Take 25 mg by mouth daily as needed for itching or allergies.      omeprazole (PRILOSEC) 20 MG capsule Take 40 mg by mouth daily.      oxyCODONE-acetaminophen (PERCOCET) 10-325 MG tablet Take 1 tablet by mouth 3 (three) times daily.   10/07/2022   ARIPiprazole (ABILIFY) 10 MG tablet Take 1 tablet (10 mg total) by mouth at bedtime. 30 tablet  0    FLUoxetine (PROZAC) 20 MG capsule Take 1 capsule (20 mg total) by mouth daily. 30 capsule 0    gabapentin (NEURONTIN) 300 MG capsule Take 2 capsules (600 mg total) by mouth 3 (three) times daily. 180 capsule 0    nicotine polacrilex (NICORETTE) 2 MG gum Take 1 each (2 mg total) by mouth as needed for smoking cessation. 100 tablet 0    psyllium (HYDROCIL/METAMUCIL) 95 % PACK Take 1 packet by mouth daily. 240 each     traZODone (DESYREL) 50 MG tablet Take 1 tablet (50 mg total) by mouth at bedtime as needed for sleep. (Patient taking differently: Take 100 mg by mouth at bedtime as needed for sleep.) 30 tablet 0     Patient Stressors:    Patient Strengths:    Treatment Modalities: Medication Management, Group therapy, Case management,  1 to  1 session with clinician, Psychoeducation, Recreational therapy.   Physician Treatment Plan for Primary Diagnosis: Major depressive disorder, recurrent severe without psychotic features (HCC) Long Term Goal(s): Improvement in symptoms so as ready for discharge   Short Term Goals: Ability to identify changes in lifestyle to reduce recurrence of condition will improve Ability to verbalize feelings will improve Ability to disclose and discuss suicidal ideas Ability to demonstrate self-control will improve Ability to identify and develop effective coping behaviors will improve  Medication Management: Evaluate patient's response, side effects, and tolerance of medication regimen.  Therapeutic Interventions: 1 to 1 sessions, Unit Group sessions and Medication administration.  Evaluation of Outcomes: Progressing  Physician Treatment Plan for Secondary Diagnosis: Principal Problem:   Major depressive disorder, recurrent severe without psychotic features (HCC) Active Problems:   Moderate cocaine use disorder (HCC)   Cannabis use disorder  Long Term Goal(s): Improvement in symptoms so as ready for discharge   Short Term Goals: Ability to identify changes in lifestyle to reduce recurrence of condition will improve Ability to verbalize feelings will improve Ability to disclose and discuss suicidal ideas Ability to demonstrate self-control will improve Ability to identify and develop effective coping behaviors will improve     Medication Management: Evaluate patient's response, side effects, and tolerance of medication regimen.  Therapeutic Interventions: 1 to 1 sessions, Unit Group sessions and Medication administration.  Evaluation of Outcomes: Progressing   RN Treatment Plan for Primary Diagnosis: Major depressive disorder, recurrent severe without psychotic features (HCC) Long Term Goal(s): Knowledge of disease and therapeutic regimen to maintain health will improve  Short Term  Goals: Ability to remain free from injury will improve, Ability to verbalize frustration and anger appropriately will improve, Ability to participate in decision making will improve, Ability to verbalize feelings will improve, Ability to identify and develop effective coping behaviors will improve, and Compliance with prescribed medications will improve  Medication Management: RN will administer medications as ordered by provider, will assess and evaluate patient's response and provide education to patient for prescribed medication. RN will report any adverse and/or side effects to prescribing provider.  Therapeutic Interventions: 1 on 1 counseling sessions, Psychoeducation, Medication administration, Evaluate responses to treatment, Monitor vital signs and CBGs as ordered, Perform/monitor CIWA, COWS, AIMS and Fall Risk screenings as ordered, Perform wound care treatments as ordered.  Evaluation of Outcomes: Progressing   LCSW Treatment Plan for Primary Diagnosis: Major depressive disorder, recurrent severe without psychotic features (HCC) Long Term Goal(s): Safe transition to appropriate next level of care at discharge, Engage patient in therapeutic group addressing interpersonal concerns.  Short Term Goals: Engage patient in aftercare  planning with referrals and resources, Increase social support, Increase emotional regulation, Facilitate acceptance of mental health diagnosis and concerns, Identify triggers associated with mental health/substance abuse issues, and Increase skills for wellness and recovery  Therapeutic Interventions: Assess for all discharge needs, 1 to 1 time with Social worker, Explore available resources and support systems, Assess for adequacy in community support network, Educate family and significant other(s) on suicide prevention, Complete Psychosocial Assessment, Interpersonal group therapy.  Evaluation of Outcomes: Progressing   Progress in Treatment: Attending groups:  Yes. Participating in groups: Yes. Taking medication as prescribed: Yes. Toleration medication: Yes. Family/Significant other contact made: No, will contact:  Ayzha Prusha (son) [phone] Patient understands diagnosis: Yes. Discussing patient identified problems/goals with staff: Yes. Medical problems stabilized or resolved: Yes. Denies suicidal/homicidal ideation: Yes. Issues/concerns per patient self-inventory: Yes. Other: N/A   New problem(s) identified: No, Describe:  None Reported   New Short Term/Long Term Goal(s): medication stabilization, elimination of SI thoughts, development of comprehensive mental wellness plan.    Patient Goals:  Medication Stabilization   Discharge Plan or Barriers: Patient recently admitted. CSW will continue to follow and assess for appropriate referrals and possible discharge planning.    Reason for Continuation of Hospitalization: Anxiety Depression Medical Issues Medication stabilization Suicidal ideation Withdrawal symptoms   Estimated Length of Stay:5-7 Days  Last 3 [place] Suicide Severity Risk Score: Flowsheet Row [facility] Ad[facility] (Discharged) [facility] Admission (Discharged) [facility]  C-SSRS RISK CATEGORY Moderate Risk High Risk Moderate Risk       Last PHQ 2/9 Scores:     No data to display          Scribe for Treatment Team: Izell [place], LCSW 10/20/2022 9:42 AM

## 2022-10-20 NOTE — Progress Notes (Signed)
   10/19/22 2300  Psych Admission Type (Psych Patients Only)  Admission Status Involuntary  Psychosocial Assessment  Patient Complaints Worrying  Eye Contact Fair  Facial Expression Animated  Affect Appropriate to circumstance  Speech Logical/coherent  Interaction Assertive  Motor Activity Slow  Appearance/Hygiene Unremarkable  Behavior Characteristics Cooperative;Appropriate to situation  Mood Pleasant  Thought Process  Coherency WDL  Content WDL  Delusions None reported or observed  Perception WDL  Hallucination None reported or observed  Judgment Poor  Confusion None  Danger to Self  Current suicidal ideation? Denies

## 2022-10-20 NOTE — Plan of Care (Signed)
  Problem: Education: Goal: Knowledge of [facility] Education information/materials will improve Outcome: Progressing Goal: Emotional status will improve Outcome: Progressing Goal: Mental status will improve Outcome: Progressing Goal: Verbalization of understanding the information provided will improve Outcome: Progressing   Problem: Activity: Goal: Interest or engagement in activities will improve Outcome: Progressing Goal: Sleeping patterns will improve Outcome: Progressing   Problem: Coping: Goal: Ability to verbalize frustrations and anger appropriately will improve Outcome: Progressing Goal: Ability to demonstrate self-control will improve Outcome: Progressing   Problem: Health Behavior/Discharge Planning: Goal: Identification of resources available to assist in meeting health care needs will improve Outcome: Progressing Goal: Compliance with treatment plan for underlying cause of condition will improve Outcome: Progressing   Problem: Physical Regulation: Goal: Ability to maintain clinical measurements within normal limits will improve Outcome: Progressing   Problem: Safety: Goal: Periods of time without injury will increase Outcome: Progressing   Problem: Education: Goal: Ability to describe self-care measures that may prevent or decrease complications (Diabetes Survival Skills Education) will improve Outcome: Progressing Goal: Individualized Educational Video(s) Outcome: Progressing   Problem: Coping: Goal: Ability to adjust to condition or change in health will improve Outcome: Progressing   Problem: Fluid Volume: Goal: Ability to maintain a balanced intake and output will improve Outcome: Progressing   Problem: Health Behavior/Discharge Planning: Goal: Ability to identify and utilize available resources and services will improve Outcome: Progressing Goal: Ability to manage health-related needs will improve Outcome: Progressing   Problem:  Metabolic: Goal: Ability to maintain appropriate glucose levels will improve Outcome: Progressing   Problem: Nutritional: Goal: Maintenance of adequate nutrition will improve Outcome: Progressing Goal: Progress toward achieving an optimal weight will improve Outcome: Progressing   Problem: Skin Integrity: Goal: Risk for impaired skin integrity will decrease Outcome: Progressing   Problem: Tissue Perfusion: Goal: Adequacy of tissue perfusion will improve Outcome: Progressing

## 2022-10-20 NOTE — Inpatient Diabetes Management (Addendum)
Inpatient Diabetes Program Recommendations  AACE/ADA: New Consensus Statement on Inpatient Glycemic Control (2015)  Target Ranges:  Prepandial:   less than 140 mg/dL      Peak postprandial:   less than 180 mg/dL (1-2 hours)      Critically ill patients:  140 - 180 mg/dL   Lab Results  Component Value Date   GLUCAP 165 (H) 10/20/2022   HGBA1C 11.2 (H) 10/15/2022    Review of Glycemic Control  Latest Reference Range & Units 10/19/22 06:16 10/19/22 11:58 10/19/22 17:05 10/19/22 20:14 10/20/22 06:15  Glucose-Capillary 70 - 99 mg/dL 657 (H) 846 (H) 962 (H) 115 (H) 165 (H)   Diabetes history: DM  Outpatient Diabetes medications:   None at present (has been prescribed both Semglee and 70/30 10 units bid in the past)  Current orders for Inpatient glycemic control:  Novolog 0-15 units tid with meals and HS Semglee 16 units q HS Novolog 6 units tid with meals Inpatient Diabetes Program Recommendations:    It appears patient needs insulin at home.  Needs close f/u with PCP regarding DM management.   She was not taking any meds for DM prior to admit.   Pt will need f/u with PCP for diabetes management. Needs insulin prescription, glucose meter and supplies at discharge. She has taken 70/30 10 units bid in the past.    Thanks,  Beryl Meager, RN, BC-ADM Inpatient Diabetes Coordinator Pager [phone]  (8a-5p)

## 2022-10-20 NOTE — [facility] Group Notes (Signed)

## 2022-10-20 NOTE — Group Note (Signed)

## 2022-10-20 NOTE — Progress Notes (Signed)
   10/20/22 1500  Psych Admission Type (Psych Patients Only)  Admission Status Involuntary  Psychosocial Assessment  Patient Complaints Worrying  Eye Contact Fair  Facial Expression Animated  Affect Appropriate to circumstance  Speech Logical/coherent  Interaction Assertive  Motor Activity Other (Comment) (wnl)  Appearance/Hygiene Unremarkable  Behavior Characteristics Cooperative  Thought Process  Coherency WDL  Content WDL  Delusions None reported or observed  Perception WDL  Hallucination None reported or observed  Judgment Poor  Confusion None  Danger to Self  Current suicidal ideation? Denies  Danger to Others  Danger to Others None reported or observed  Danger to Others Abnormal  Harmful Behavior to others No threats or harm toward other people

## 2022-10-20 NOTE — Progress Notes (Signed)
   10/20/22 2219  Psych Admission Type (Psych Patients Only)  Admission Status Involuntary  Psychosocial Assessment  Patient Complaints None  Eye Contact Fair  Facial Expression Animated  Affect Appropriate to circumstance  Speech Logical/coherent  Interaction Assertive  Motor Activity Other (Comment) (wdl)  Appearance/Hygiene Unremarkable  Behavior Characteristics Cooperative  Mood Pleasant  Thought Process  Coherency WDL  Content WDL  Delusions None reported or observed  Perception WDL  Hallucination None reported or observed  Judgment Impaired  Confusion None  Danger to Self  Current suicidal ideation? Denies  Self-Injurious Behavior No self-injurious ideation or behavior indicators observed or expressed   Danger to Others  Danger to Others None reported or observed  Danger to Others Abnormal  Harmful Behavior to others No threats or harm toward other people   D: Patient in dayroom reports she had a good day and is able to engage therapeutically. Pt attended evening wrap up group. Pt reports her main stressor is finding a safe place to go after discharge. A: Medications administered as prescribed. Support and encouragement provided as needed.  R: Patient remains safe on the unit.

## 2022-10-20 NOTE — Group Note (Signed)
Recreation Therapy Group Note   Group Topic:Stress Management  Group Date: 10/20/2022 Start Time: 0935 End Time: 0950 Facilitators: Aleyah Balik-McCall, LRT,CTRS Location: [address] Dayroom   Group Topic: Stress Management   Goal Area(s) Addresses:  Patient will actively participate in stress management techniques presented during session.  Patient will successfully identify benefit of practicing stress management post d/c.   Intervention: Relaxation exercise with ambient sound and script   Group Description: Guided Imagery. LRT provided education, instruction, and demonstration on practice of visualization via guided imagery. Patient was asked to participate in the technique introduced during session. LRT debriefed including topics of mindfulness, stress management and specific scenarios each patient could use these techniques. Patients were given suggestions of ways to access scripts post d/c and encouraged to explore Youtube and other apps available on smartphones, tablets, and computers.  Education:  Stress Management, Discharge Planning.   Education Outcome: Acknowledges education   Affect/Mood: Appropriate   Participation Level: Engaged   Participation Quality: Independent   Behavior: Appropriate   Speech/Thought Process: Focused   Insight: Good   Judgement: Good   Modes of Intervention: Stress Management   Patient Response to Interventions:  Engaged   Education Outcome:  In group clarification offered    Clinical Observations/Individualized Feedback: Pt attended and participated in group session. Pt was attentive during group session.      Plan: Continue to engage patient in RT group sessions 2-3x/week.   Sara [name], LRT,CTRS  10/20/2022 12:18 PM

## 2022-10-20 NOTE — [facility] Group Notes (Signed)
[facility] Group Notes:  (Nursing/MHT/Case Management/Adjunct)  Date:  10/20/2022  Time:  9:33 PM  Type of Therapy:  Psychoeducational Skills  Participation Level:  Active  Participation Quality:  Attentive  Affect:  Appropriate  Cognitive:  Appropriate  Insight:  Good  Engagement in Group:  Developing/Improving  Modes of Intervention:  Education  Summary of Progress/Problems: Patient rated her day as a 6 out of a possible 10. Patient states that her blood pressure and blood sugar are now within normal limits. Her goal for today was to attend the different groups. Her goal for tomorrow is to "communicate more".   Westly Pam 10/20/2022, 9:33 PM

## 2022-10-21 DIAGNOSIS — F332 Major depressive disorder, recurrent severe without psychotic features: Secondary | ICD-10-CM | POA: Diagnosis not present

## 2022-10-21 LAB — GLUCOSE, CAPILLARY
Glucose-Capillary: 195 mg/dL — ABNORMAL HIGH (ref 70–99)
Glucose-Capillary: 116 mg/dL — ABNORMAL HIGH (ref 70–99)

## 2022-10-21 MED ORDER — LINACLOTIDE 145 MCG PO CAPS: 145.0000 ug | ORAL_CAPSULE | Freq: Every day | ORAL | 0 refills | Status: DC

## 2022-10-21 MED ORDER — HYDROXYZINE HCL 25 MG PO TABS: 25.0000 mg | ORAL_TABLET | Freq: Four times a day (QID) | ORAL | 0 refills | Status: DC | PRN

## 2022-10-21 MED ORDER — INSULIN GLARGINE-YFGN 100 UNIT/ML ~~LOC~~ SOLN: 16.0000 [IU] | Freq: Every day | SUBCUTANEOUS | 11 refills | Status: DC

## 2022-10-21 MED ORDER — GABAPENTIN 300 MG PO CAPS: 600.0000 mg | ORAL_CAPSULE | Freq: Three times a day (TID) | ORAL | 0 refills | Status: DC

## 2022-10-21 MED ORDER — FLUOXETINE HCL 20 MG PO CAPS: 20.0000 mg | ORAL_CAPSULE | Freq: Every day | ORAL | 0 refills | Status: DC

## 2022-10-21 MED ORDER — INSULIN ASPART 100 UNIT/ML IJ SOLN: 0.0000 [IU] | Freq: Three times a day (TID) | INTRAMUSCULAR | 11 refills | Status: DC

## 2022-10-21 MED ORDER — ARIPIPRAZOLE 10 MG PO TABS: 10.0000 mg | ORAL_TABLET | Freq: Every day | ORAL | 0 refills | Status: DC

## 2022-10-21 MED ORDER — NICOTINE POLACRILEX 2 MG MT GUM: 2.0000 mg | CHEWING_GUM | OROMUCOSAL | 0 refills | Status: DC | PRN

## 2022-10-21 MED ORDER — PANTOPRAZOLE SODIUM 40 MG PO TBEC: 40.0000 mg | DELAYED_RELEASE_TABLET | Freq: Every day | ORAL | 0 refills | Status: DC

## 2022-10-21 MED ORDER — INSULIN ASPART 100 UNIT/ML IJ SOLN: 0.0000 [IU] | Freq: Every day | INTRAMUSCULAR | 11 refills | Status: DC

## 2022-10-21 MED ORDER — OXYCODONE-ACETAMINOPHEN 5-325 MG PO TABS: 1.0000 | ORAL_TABLET | Freq: Three times a day (TID) | ORAL | 0 refills | Status: DC | PRN

## 2022-10-21 MED ORDER — METOPROLOL SUCCINATE ER 25 MG PO TB24: 12.5000 mg | ORAL_TABLET | Freq: Every day | ORAL | 0 refills | Status: DC

## 2022-10-21 NOTE — Group Note (Signed)
LCSW Group Therapy Note  Group Date: 10/21/2022 Start Time: 1100 End Time: 1145   Type of Therapy and Topic:  Group Therapy - How To Cope with Nervousness about Discharge   Participation Level:  Did Not Attend   Description of Group This process group involved identification of patients' feelings about discharge. Some of them are scheduled to be discharged soon, while others are new admissions, but each of them was asked to share thoughts and feelings surrounding discharge from the hospital. One common theme was that they are excited at the prospect of going home, while another was that many of them are apprehensive about sharing why they were hospitalized. Patients were given the opportunity to discuss these feelings with their peers in preparation for discharge.  Therapeutic Goals  Patient will identify their overall feelings about pending discharge. Patient will think about how they might proactively address issues that they believe will once again arise once they get home (i.e. with parents). Patients will participate in discussion about having hope for change.     Therapeutic Modalities Cognitive Behavioral Therapy   Ane Payment, LCSW 10/21/2022  1:31 PM

## 2022-10-21 NOTE — Progress Notes (Signed)
Discharge Note;  Pt has been awake and alert x4.  Bright affect and congruent mood. Visible in milieu and interacting appropriately with peers.  Pt verbalized readiness for discharge.     Pt given taxi voucher per SW and Suicide safety plan completed.  Pt denies SI, HI or AVH. Pt was given AVS along with follow up appointments and instructions on picking up her called in prescriptions. She verbalized understanding.   In addition to called in medications pt was also given a prescription for Percocet 5/325, Nicotine Gum, prozac,and gabapentin.  Pt verbalized understanding on medication management.   Pt was given back all belongings from locker #52 and escorted to lobby.   No distress noted.

## 2022-10-21 NOTE — [facility] Group Notes (Signed)
[facility] Group Notes:  (Nursing/MHT/Case Management/Adjunct) Adult Psychoeducational Group Note  Date:  10/21/2022 Time:  10:08 AM  Group Topic/Focus:  Emotional Education:   The focus of this group is to discuss what feelings/emotions are, and how they are experienced. Goals Group:   The focus of this group is to help patients establish daily goals to achieve during treatment and discuss how the patient can incorporate goal setting into their daily lives to aide in recovery. Orientation:   The focus of this group is to educate the patient on the purpose and policies of crisis stabilization and provide a format to answer questions about their admission.  The group details unit policies and expectations of patients while admitted.  Participation Level:  Active  Participation Quality:  Appropriate  Affect:  Appropriate  Cognitive:  Appropriate  Insight: Good  Engagement in Group:  Engaged  Modes of Intervention:  Discussion  Additional Comments:    Sara [name] 10/21/2022, 10:08 AM

## 2022-10-21 NOTE — Progress Notes (Signed)
  [facility] Adult Case Management Discharge Plan :  Will you be returning to the same living situation after discharge:  Yes,  Pt is homeless At discharge, do you have transportation home?: Yes,  Pt will be provided a Taxi from [place]  Do you have the ability to pay for your medications: Yes,  [employer] plan   Release of information consent forms completed and in the chart;  Patient's signature needed at discharge.  Patient to Follow up at:  Follow-up Information     [facility] [employer]. Go to.   Why: Please go to this provider for group therapy and medication management services. They are open 24/7. Contact information: [address] [place] [address] [phone]                 Next level of care provider has access to [facility] Link:no  Safety Planning and Suicide Prevention discussed: No.     Has patient been referred to the Quitline?: Patient refused referral for treatment  Patient has been referred for addiction treatment: Patient refused referral for treatment.  Izell [place], LCSW 10/21/2022, 1:26 PM

## 2022-10-21 NOTE — Plan of Care (Signed)
  Problem: Activity: Goal: Interest or engagement in activities will improve Outcome: Progressing Goal: Sleeping patterns will improve Outcome: Progressing   

## 2022-10-21 NOTE — [facility]HH Counselor (Signed)
10/21/2022  Sara [name] DOB: 1976/01/12 MRN: [id number]   RIDER WAIVER AND RELEASE OF LIABILITY  For the purposes of helping with transportation needs, [facility] partners with outside transportation providers (taxi companies, [employer], Catering manager.) to give [employer] patients or other approved people the choice of on-demand rides [employer]") to our buildings for non-emergency visits.  By using [employer], I, the person signing this document, on behalf of myself and/or any legal minors (in my care using the [employer]), agree:  Science writer given to me are supplied by independent, outside transportation providers who do not work for, or have any affiliation with, [employer]. [facility] is not a transportation company. [facility] has no control over the quality or safety of the rides I get using [employer]. [facility] has no control over whether any outside ride will happen on time or not. [facility] gives no guarantee on the reliability, quality, safety, or availability on any rides, or that no mistakes will happen. I know and accept that traveling by vehicle (car, truck, [employer], [employer]enaida Niece, bus, taxi, etc.) has risks of serious injuries such as disability, being paralyzed, and death. I know and agree the risk of using [employer] is mine alone, and not [employer]. Transport Services are provided "as is" and as are available. The transportation providers are in charge for all inspections and care of the vehicles used to provide these rides. I agree not to take legal action against [facility], its agents, employees, officers, directors, representatives, insurers, attorneys, assigns, successors, subsidiaries, and affiliates at any time for any reasons related directly or indirectly to using [employer]. I also agree not to take legal action against [facility] or its affiliates for any injury, death, or damage to property caused by or related to using  [employer]. I have read this Waiver and Release of Liability, and I understand the terms used in it and their legal meaning. This Waiver is freely and voluntarily given with the understanding that my right (or any legal minors) to legal action against  relating to [employer] is knowingly given up to use these services.   I attest that I read the Ride Waiver and Release of Liability to Sara [name], gave Ms. Nesheim the opportunity to ask questions and answered the questions asked (if any). I affirm that Sara [name] then provided consent for assistance with transportation.
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
## 2022-10-21 NOTE — Discharge Summary (Signed)
Physician Discharge Summary Note  Patient:  Sara [name] is an 47 y.o., female MRN:  [id number] DOB:  December 14, 1975 Patient phone:  [phone] (home)  Patient address:   [address] [place] [address],  Total Time spent with patient: 30 minutes  Date of Admission:  10/14/2022 Date of Discharge:   10/21/2022  Reason for Admission:    Principal Problem: Major depressive disorder, recurrent severe without psychotic features [facility]) Discharge Diagnoses: Principal Problem:   Major depressive disorder, recurrent severe without psychotic features (HCC) Active Problems:   Moderate cocaine use disorder (HCC)   Cannabis use disorder  Past Psychiatric History:  Prior Psychiatric diagnoses: Reports history of diagnosed bipolar disorder but unable to specify symptoms consistent with mania or hypomania except in the presence of cocaine use  Past Psychiatric Hospitalizations: 2 previous hospitalizations to this facility May 2024 for 4 days in June 2024 for 7 days, discharge medications in June included Prozac, Abilify, gabapentin, Percocet, trazodone   History of self mutilation: Denies Past suicide attempts: 2 previous suicide attempts last time years ago by overdose Past history of HI, violent or aggressive behavior: History of getting in fights related to irritability   History of good response to Prozac and Abilify History of ECT/TMS: Denies   Outpatient psychiatric Follow up: When discharged from [facility] in June.  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 pylori infection    High risk medication use    Hypercholesterolemia    Hyperlipidemia, mixed    Hypertension    Other mixed anxiety disorders     Pollen allergies    Sickle cell trait (HCC)    Sleep apnea    Vitamin D deficiency     Past Surgical History:  Procedure Laterality Date   ABDOMINAL HYSTERECTOMY     APPENDECTOMY     COLONOSCOPY  08-16-2008   Dr. Vallarie Mare   ESOPHAGOGASTRODUODENOSCOPY  08-16-2008   Dr. Rayfield Citizen    Family History:  Family History  Problem Relation Age of Onset   Clotting disorder Mother    Crohn's disease Mother    Heart disease Mother    Colon cancer Father    Clotting disorder Brother    Diabetes Brother    Diabetes Maternal Aunt    Liver cancer Maternal Uncle    Prostate cancer Maternal Uncle    Colon cancer Maternal Uncle    Diabetes Paternal Aunt    Esophageal cancer Paternal Uncle    Family Psychiatric  History: See H&P  Social History:  Social History   Substance and Sexual Activity  Alcohol Use Not Currently     Social History   Substance and Sexual Activity  Drug Use Not Currently   Types: Marijuana, Cocaine   Comment: 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    Social History   Socioeconomic History   Marital status: Divorced    Spouse name: Not on file   Number of children: 2   Years of education: Not on file   Highest education level: Not on file  Occupational History   Occupation: n/a  Tobacco Use   Smoking status: Every Day    Current  packs/day: 1.00    Types: Cigarettes   Smokeless tobacco: Never   Tobacco comments:    Will order nicotine gum  Substance and Sexual Activity   Alcohol use: Not Currently   Drug use: Not Currently    Types: Marijuana, Cocaine    Comment: 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   Sexual activity: Not Currently  Other Topics Concern   Not on file  Social History Narrative   Not on file   Social Determinants of Health   Financial Resource Strain: High Risk (01/21/2022)   Received from [facility], [facility]   Overall Financial Resource Strain (CARDIA)    Difficulty of Paying Living Expenses:  Very hard  Food Insecurity: Food Insecurity Present (10/14/2022)   Hunger Vital Sign    Worried About Running Out of Food in the Last Year: Sometimes true    Ran Out of Food in the Last Year: Sometimes true  Transportation Needs: Unmet Transportation Needs (10/14/2022)   PRAPARE - Transportation    Lack of Transportation (Medical): No    Lack of Transportation (Non-Medical): Yes  Physical Activity: Sufficiently Active (01/21/2022)   Received from [facility], [facility]   Exercise Vital Sign    Days of Exercise per Week: 7 days    Minutes of Exercise per Session: 30 min  Stress: Stress Concern Present (01/21/2022)   Received from [facility], [facility]   [employer] of Occupational Health - Occupational Stress Questionnaire    Feeling of Stress : Very much  Social Connections: Socially Isolated (01/21/2022)   Received from [facility], [facility]   Social Network    How would you rate your social network (family, work, friends)?: Little participation, lonely and socially isolated    Hospital Course:  During the patient's hospitalization, patient had extensive initial psychiatric evaluation, and follow-up psychiatric evaluations every day.  Psychiatric diagnoses provided upon initial assessment:   MDD (major depressive disorder), severe (HCC) [F32.2]   Patient's psychiatric medications were adjusted on admission:              Restarted Prozac 20 mg daily for management of depression and anxiety             Restart Abilify 5 mg daily for mood stabilization and depression, titrate to 10 mg daily in the next few days if well-tolerated             Restarted gabapentin 600 mg 3 times daily for neuropathic pain, home medication             Start pantoprazole 40 mg daily for GERD, patient was on omeprazole 20 mg daily home medication             Metformin twice daily was listed as home medication at [facility] emergency room note but patient denies she was receiving  metformin, history of metformin treatment that led to stomach upset and diarrhea, patient does not recall her home regimen for diabetes and has not been compliant with, consulted diabetes management team for further management.             Restart Percocet 5/325 3 times daily as needed for chronic pain, patient was on 10/325 3 times daily as needed for pain as home medication  During the hospitalization, other adjustments were made to the patient's psychiatric medication regimen:  Abilify increased to 10 mg p.o. daily for mood stabilization and depression  Patient's care was discussed during the interdisciplinary team meeting every day during  the hospitalization.  The patient denies having side effects to prescribed psychiatric medication.  Gradually, patient started adjusting to milieu. The patient was evaluated each day by a clinical provider to ascertain response to treatment. Improvement was noted by the patient's report of decreasing symptoms, improved sleep and appetite, affect, medication tolerance, behavior, and participation in unit programming.  Patient was asked each day to complete a self inventory noting mood, mental status, pain, new symptoms, anxiety and concerns.    Symptoms were reported as significantly decreased or resolved completely by discharge.   On day of discharge, the patient reports that their mood is stable. The patient denied having suicidal thoughts for more than 48 hours prior to discharge.  Patient denies having homicidal thoughts.  Patient denies having auditory hallucinations.  Patient denies any visual hallucinations or other symptoms of psychosis. The patient was motivated to continue taking medication with a goal of continued improvement in mental health.   The patient reports her target psychiatric symptoms of depression responded well to the psychiatric medications, and the patient reports overall benefit other psychiatric hospitalization. Supportive  psychotherapy was provided to the patient. The patient also participated in regular group therapy while hospitalized. Coping skills, problem solving as well as relaxation therapies were also part of the unit programming.  Labs were reviewed with the patient, and abnormal results were discussed with the patient.  The patient is able to verbalize their individual safety plan to this provider.  # It is recommended to the patient to continue psychiatric medications as prescribed, after discharge from the hospital.    # It is recommended to the patient to follow up with your outpatient psychiatric provider and PCP.  # It was discussed with the patient, the impact of alcohol, drugs, tobacco have been there overall psychiatric and medical wellbeing, and total abstinence from substance use was recommended the patient.ed.  # Prescriptions provided or sent directly to preferred pharmacy at discharge. Patient agreeable to plan. Given opportunity to ask questions. Appears to feel comfortable with discharge.    # In the event of worsening symptoms, the patient is instructed to call the crisis hotline, 911 and or go to the nearest ED for appropriate evaluation and treatment of symptoms. To follow-up with primary care provider for other medical issues, concerns and or health care needs  # Patient was discharged home with a plan to follow up as noted below.   Addendum: The following medications Abilify, nicotine gum, Prozac, gabapentin, has components of red dye which patient has allergy.  However, patient reports she has been taking these medication for a long time and does not react to them.  Physical Findings: AIMS: Facial and Oral Movements Muscles of Facial Expression: None, normal Lips and Perioral Area: None, normal Jaw: None, normal Tongue: None, normal,Extremity Movements Upper (arms, wrists, hands, fingers): None, normal Lower (legs, knees, ankles, toes): None, normal, Trunk Movements Neck,  shoulders, hips: None, normal, Overall Severity Severity of abnormal movements (highest score from questions above): None, normal Incapacitation due to abnormal movements: None, normal Patient's awareness of abnormal movements (rate only patient's report): No Awareness, Dental Status Current problems with teeth and/or dentures?: No Does patient usually wear dentures?: No  CIWA:    COWS:     Musculoskeletal: Strength & Muscle Tone: within normal limits Gait & Station: normal Patient leans: N/A  Psychiatric Specialty Exam:  Presentation  General Appearance:  Casual; Fairly Groomed; Appropriate for Environment  Eye Contact: Good  Speech: Clear and Coherent; Normal Rate  Speech Volume: Normal  Handedness: Right  Mood and Affect  Mood: Euthymic  Affect: Appropriate; Congruent  Thought Process  Thought Processes: Coherent  Descriptions of Associations:Intact  Orientation:Full (Time, Place and Person)  Thought Content:Logical  History of Schizophrenia/Schizoaffective disorder:No data recorded Duration of Psychotic Symptoms:No data recorded Hallucinations:No data recorded Ideas of Reference:None  Suicidal Thoughts:No data recorded Homicidal Thoughts:No data recorded  Sensorium  Memory: Immediate Fair; Recent Fair  Judgment: Fair  Insight: Fair  Art therapist  Concentration: Good  Attention Span: Good  Recall: [employer] of Knowledge: Fair  Language: Fair  Psychomotor Activity  Psychomotor Activity:No data recorded  Assets  Assets: Communication Skills; Desire for Improvement; Physical Health; Resilience  Sleep  Sleep:No data recorded  Physical Exam: Physical Exam Vitals and nursing note reviewed.  HENT:     Head: Normocephalic.     Nose: Nose normal.     Mouth/Throat:     Mouth: Mucous membranes are moist.  Eyes:     Extraocular Movements: Extraocular movements intact.  Cardiovascular:     Rate and Rhythm:  Tachycardia present.     Comments: Blood pressure 105/72, pulse 102.  Patient is asymptomatic. Pulmonary:     Effort: Pulmonary effort is normal.  Abdominal:     Comments: Deferred  Genitourinary:    Comments: Deferred Musculoskeletal:        General: Normal range of motion.     Cervical back: Normal range of motion.  Skin:    General: Skin is warm.  Neurological:     General: No focal deficit present.     Mental Status: She is alert and oriented to person, place, and time.  Psychiatric:        Mood and Affect: Mood normal.        Behavior: Behavior normal.        Thought Content: Thought content normal.        Judgment: Judgment normal.    Review of Systems  Constitutional:  Negative for chills and fever.  HENT:  Negative for sore throat.   Eyes:  Negative for blurred vision.  Respiratory:  Negative for cough, shortness of breath and wheezing.   Cardiovascular:  Negative for chest pain and palpitations.       Blood pressure 105/72, pulse 102.  Patient is asymptomatic.  Gastrointestinal:  Negative for abdominal pain, heartburn, nausea and vomiting.  Genitourinary:  Negative for dysuria, frequency and urgency.  Musculoskeletal:  Positive for back pain, myalgias and neck pain.  Skin:  Negative for itching and rash.  Neurological:  Negative for dizziness, tingling, tremors and headaches.  Endo/Heme/Allergies:        See allergy listing does good  Psychiatric/Behavioral:  Positive for depression (Stable with medication). The patient is nervous/anxious (Improved with medication) and has insomnia (Improved with medication).    Blood pressure 105/72, pulse (!) 102, temperature 98.4 F (36.9 C), temperature source Oral, resp. rate 18, height 5\' 4"  (1.626 m), weight 62 kg, SpO2 99%. Body mass index is 23.45 kg/m.   Social History   Tobacco Use  Smoking Status Every Day   Current packs/day: 1.00   Types: Cigarettes  Smokeless Tobacco Never  Tobacco Comments   Will order  nicotine gum   Tobacco Cessation:  A prescription for an FDA-approved tobacco cessation medication provided at discharge   Blood Alcohol level:  Lab Results  Component Value Date   [facility] <10 06/17/2022   [facility]  12/11/2009    <5  LOWEST DETECTABLE LIMIT FOR SERUM ALCOHOL IS 5 mg/dL FOR MEDICAL PURPOSES ONLY   Metabolic Disorder Labs:  Lab Results  Component Value Date   HGBA1C 11.2 (H) 10/15/2022   MPG 275 10/15/2022   MPG 318 02/01/2022   No results found for: "PROLACTIN" Lab Results  Component Value Date   CHOL 274 (H) 10/15/2022   TRIG 86 10/15/2022   HDL 63 10/15/2022   CHOLHDL 4.3 10/15/2022   VLDL 17 10/15/2022   LDLCALC 194 (H) 10/15/2022   LDLCALC 131 (H) 02/01/2022   See Psychiatric Specialty Exam and Suicide Risk Assessment completed by Attending Physician prior to discharge.  Discharge destination:  Home  Is patient on multiple antipsychotic therapies at discharge:  No   Has Patient had three or more failed trials of antipsychotic monotherapy by history:  No  Recommended Plan for Multiple Antipsychotic Therapies: NA  Discharge Instructions     Ambulatory referral to Nutrition and Diabetic Education   Complete by: As directed    Uncontrolled DM2 with HgbA1C of 12.7%   Diet - low sodium heart healthy   Complete by: As directed    Diet Carb Modified   Complete by: As directed    Increase activity slowly   Complete by: As directed    Increase activity slowly   Complete by: As directed       Allergies as of 10/21/2022       Reactions   Clarithromycin Itching, Rash   Doxycycline Anaphylaxis, Rash   Penicillins Anaphylaxis, Rash   Tetracyclines & Related Anaphylaxis   Metronidazole Itching, Rash   Red Dye #40 (allura Red) Itching   Likely red dye allergy - rx with Tylenol elixir, can tolerate tablets        Medication List     STOP taking these medications    albuterol 108 (90 Base) MCG/ACT inhaler Commonly known as: VENTOLIN HFA    diphenhydrAMINE 25 mg capsule Commonly known as: BENADRYL   omeprazole 20 MG capsule Commonly known as: PRILOSEC   oxyCODONE-acetaminophen 10-325 MG tablet Commonly known as: PERCOCET Replaced by: oxyCODONE-acetaminophen 5-325 MG tablet   psyllium 95 % Pack Commonly known as: HYDROCIL/METAMUCIL   traZODone 50 MG tablet Commonly known as: DESYREL       TAKE these medications      Indication  ARIPiprazole 10 MG tablet Commonly known as: ABILIFY Take 1 tablet (10 mg total) by mouth daily. Start taking on: October 22, 2022 What changed: when to take this  Indication: Major Depressive Disorder   FLUoxetine 20 MG capsule Commonly known as: PROZAC Take 1 capsule (20 mg total) by mouth daily.  Indication: Major Depressive Disorder   gabapentin 300 MG capsule Commonly known as: NEURONTIN Take 2 capsules (600 mg total) by mouth 3 (three) times daily.  Indication: Diabetes with Nerve Disease   hydrOXYzine 25 MG tablet Commonly known as: ATARAX Take 1 tablet (25 mg total) by mouth every 6 (six) hours as needed for anxiety.  Indication: Feeling Anxious   insulin aspart 100 UNIT/ML injection Commonly known as: novoLOG Inject 0-15 Units into the skin 3 (three) times daily with meals.  Indication: Type 2 Diabetes   insulin aspart 100 UNIT/ML injection Commonly known as: novoLOG Inject 0-5 Units into the skin at bedtime.  Indication: Type 2 Diabetes   insulin glargine-yfgn 100 UNIT/ML injection Commonly known as: SEMGLEE Inject 0.16 mLs (16 Units total) into the skin at bedtime.  Indication: Type 2 Diabetes   linaclotide 145 MCG Caps capsule  Commonly known as: LINZESS Take 1 capsule (145 mcg total) by mouth daily before breakfast. Start taking on: October 22, 2022  Indication: Opioid-Induced Constipation   metoprolol succinate 25 MG 24 hr tablet Commonly known as: TOPROL-XL Take 0.5 tablets (12.5 mg total) by mouth daily. Start taking on: October 22, 2022   Indication: High Blood Pressure   nicotine polacrilex 2 MG gum Commonly known as: NICORETTE Take 1 each (2 mg total) by mouth as needed for smoking cessation.  Indication: Nicotine Addiction   oxyCODONE-acetaminophen 5-325 MG tablet Commonly known as: PERCOCET/ROXICET Take 1 tablet by mouth every 8 (eight) hours as needed for moderate pain. Replaces: oxyCODONE-acetaminophen 10-325 MG tablet  Indication: Pain   pantoprazole 40 MG tablet Commonly known as: PROTONIX Take 1 tablet (40 mg total) by mouth daily. Start taking on: October 22, 2022  Indication: Gastroesophageal Reflux Disease        Follow-up Information     [facility]. Go to.   Why: Please go to this provider for group therapy and medication management services. They are open 24/7. Contact information: [address] [place] [address] [phone]                Follow-up recommendations:   Discharge Recommendations:  The patient is being discharged with her family. Patient is to take her discharge medications as ordered.  See follow up above. We recommend that she participates in individual therapy to target uncontrollable agitation and substance abuse.  We recommend that she participates in therapy to target the conflict with her family, to improve communication skills and conflict resolution skills.  patient is to initiate/implement a contingency based behavioral model to address patient's behavior. We recommend that she gets AIMS scale, height, weight, blood pressure, fasting lipid panel, fasting blood sugar in three months from discharge if she's on atypical antipsychotics.  Patient will benefit from monitoring of recurrent suicidal ideation since patient is on antidepressant medication. The patient should abstain from all illicit substances and alcohol. If the patient's symptoms worsen or do not continue to improve or if the patient becomes actively suicidal or homicidal then it  is recommended that the patient return to the closest hospital emergency room or call 911 for further evaluation and treatment. National Suicide Prevention Lifeline 1800-SUICIDE or (808)039-6372. Please follow up with your primary medical doctor for all other medical needs.  The patient has been educated on the possible side effects to medications and she/her guardian is to contact a medical professional and inform outpatient provider of any new side effects of medication. She is to take regular diet and activity as tolerated.  Will benefit from moderate daily exercise. Patient was educated about removing/locking any firearms, medications or dangerous products from the home.  Activity:  As tolerated Diet:  Regular Diet   Signed: Cecilie Lowers, FNP 10/21/2022, 1:04 PM

## 2022-10-21 NOTE — Group Note (Signed)
Recreation Therapy Group Note   Group Topic:Animal Assisted Therapy   Group Date: 10/21/2022 Start Time: 0945 End Time: 1030 Facilitators: Tametra Ahart-McCall, LRT,CTRS Location: [address] Dayroom   Animal-Assisted Activity (AAA) Program Checklist/Progress Notes Patient Eligibility Criteria Checklist & Daily Group note for Rec Tx Intervention  AAA/T Program Assumption of Risk Form signed by Patient/ or Parent Legal Guardian Yes  Patient understands his/her participation is voluntary Yes   Affect/Mood: N/A   Participation Level: Did not attend    Clinical Observations/Individualized Feedback:     Plan: Continue to engage patient in RT group sessions 2-3x/week.   Johonna Binette-McCall, LRT,CTRS 10/21/2022 1:31 PM

## 2023-01-06 ENCOUNTER — Other Ambulatory Visit: Payer: Self-pay | Admitting: Critical Care Medicine

## 2023-01-06 ENCOUNTER — Telehealth: Payer: Self-pay | Admitting: Critical Care Medicine

## 2023-01-06 LAB — GLUCOSE, POCT (MANUAL RESULT ENTRY): POC Glucose: 355 mg/dL — AB (ref 70–99)

## 2023-01-06 NOTE — Congregational Nurse Program (Signed)
Dept: [phone]   Congregational Nurse Program Note  Date of Encounter: 01/06/2023  Pulse 114, BP 148/95, weight 150.6 lbs, non fasting glucose 355. Recent admission to [facility] few days ago, was in a tent prior to [employer]. -History diabetes 10 years, family members also with diabetes, hypertension, sciatica, scoliosis, osteoporosis, neuropathy, vaginitis. -Last medical appointment [facility] -Next scheduled appointment [facility] January 2025, refills on medications Client reported this am she ate bowl grits with salt and pepper, toast, 2 cups caffienated coffee, cheese doodles with salt, no water today feels thirsty. Instructed to hydrate due glucose reading, salt and caffeine reported. Client verbalized understanding. -Reported recent visit [facility] 01-04-23  informed she had veritgo, blood sugar was 200 at that visit. RX prescribed at visit Meclizine HCL 25 mg 3 times per day, still at [facility], not dizzy today. Recent medication history: -Reported was taking Farxiga 5 mg daily, out of med for 3 weeks, refill sent to [place], [place], has not obtained yet. History taking Lantus, Novalog insulin for 3 months, did not have a refrigerator when in a tent, not taking, Metformin side effects nausea.  -Did not have food to eat was reason client reported not compliant with medications. -Not taking Abilitfy, last dose August 2024, reported not depressed, not hearing voices, still struggling witih OCD. -Not taking Trazadone, last dose August 2024 -Prozac last dose August 2024 -Levofloxacin 750 mg take 1 daily prescribed 09/02/22, 8 pills remaining, prescribed for vaginitis and itching, [facility] -Clindamycin 300 mg 1 capsule by mouth three times daily 11/22/22, 8 pills remaining, [facility] -Cyclobenzaprine 5 mg, 1 tab 3 times daily, 12/19/22, out of this med Instructed importance of taking entire amount of antibiotic to  treat infection, client verbalized understanding. Yes taking Gabapentin 300 mg 2 capsules daily =600 mg  Yes taking Omeprazole 40 mg daily Yes taking Docusate Sodium 100 mg , 2 times daily -Out of Oxycodone 5 mg for sciatica  Reported 7 am, Tylenol 1500 mg did not improve back pain -Client requested a PCP closer to [employer].  PCP Reather Littler called [phone] not accepting new clients until end of January 2025. Contacted [facility], scheduled visit January 23, 2023, 10 am, arrive 945 am. [phone], client informed, and wrote down on paper provided by nurse. -Nurse recommend scripts sent to [facility] pharmacy, close to [employer], ask for delivery to [employer]. Client called pharmacy today asking if [place] script, after prior auth, sent to [employer], [place], copay for [employer] 4.00 copay. -Instructed client to schedule with Medicaid transportation for doctor visits, referral to [employer], client reported understanding and  plans to contact her about obtaining a new card. Jeanene Erb [facility] [phone] [facility] Nurse referred client to Hospital. 1:26 pm called Dr. Delford Field, per his directions, client instructed to obtain [facility] prescription today and go to Dr. Sedalia Muta appointment this month: Client instructed the same, client verbalized understanding instructions, 01/23/23 Dr. Sedalia Muta, 10 am, arrive 945 am. [phone] 6:23 pm nurse called client, client reported Marcelline Deist available 01/07/23 [facility], reported no current symptoms hyperglycemia, reviewed signs/symptoms with her: excessive thirst, frequent hunger, large amounts urination, including weakness and fatigue, headache, blurred vision, instructed to get Farxiga 01/07/23, go to ED tonight or tomrorow if signs/symptoms  hyperglycemia and if unable to get [place] tomorrow, client verbalized understanding.  Per Dr. Delford Field 6:26 pm, in phone note, agreed with above  plan. 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 pylori infection    High risk medication use    Hypercholesterolemia    Hyperlipidemia, mixed    Hypertension    Other mixed anxiety disorders    Pollen allergies    Sickle cell trait (HCC)    Sleep apnea    Vitamin D deficiency     Encounter Details:  Community Questionnaire - 01/06/23 1110       Questionnaire   Ask client: Do you give verbal consent for me to treat you today? Yes    Student Assistance N/A    Location Patient Served  [facility]    Encounter Setting CN site    Population Status Unknown    Insurance Medicaid   needs a new Medicaid card, did not come to [facility] address   Insurance/Financial Assistance Referral N/A    Medication Have Medication Insecurities    Medical Provider Yes    Screening Referrals Made N/A    Medical Referrals Made [facility] PCP[facility]inic    Medical Appointment Completed N/A    CNP Interventions Advocate/Support;Navigate Healthcare System;Case Management;Counsel;Educate;Reviewed Medications    Screenings CN Performed Blood Pressure;Blood Glucose;Weight    ED Visit Averted Yes    Life-Saving Intervention Made Yes

## 2023-01-06 NOTE — Telephone Encounter (Signed)
Received call from congressional nurse will refer patient has blood sugar 355 out of all medications has been on insulin and Farxiga in the past.  The patient is stable at this time.  Patient does have refills on Farxiga.  She can get an appointment in 1 month for primary care.  Plan is for the patient to obtain her Marcelline Deist if she cannot then she will have to go to the emergency room for further evaluation

## 2023-01-09 ENCOUNTER — Inpatient Hospital Stay (HOSPITAL_COMMUNITY)
Admission: AD | Admit: 2023-01-09 | Discharge: 2023-01-11 | DRG: 948 | Disposition: A | Discharge status: Home / Self Care | Payer: MEDICAID | Source: Ambulatory Visit | Attending: Internal Medicine | Admitting: Internal Medicine

## 2023-01-09 ENCOUNTER — Inpatient Hospital Stay (HOSPITAL_COMMUNITY): Payer: MEDICAID

## 2023-01-09 ENCOUNTER — Encounter (HOSPITAL_COMMUNITY): Payer: Self-pay

## 2023-01-09 DIAGNOSIS — Z8249 Family history of ischemic heart disease and other diseases of the circulatory system: Secondary | ICD-10-CM

## 2023-01-09 DIAGNOSIS — D573 Sickle-cell trait: Secondary | ICD-10-CM | POA: Diagnosis present

## 2023-01-09 DIAGNOSIS — E1151 Type 2 diabetes mellitus with diabetic peripheral angiopathy without gangrene: Secondary | ICD-10-CM | POA: Diagnosis present

## 2023-01-09 DIAGNOSIS — Z5941 Food insecurity: Secondary | ICD-10-CM | POA: Diagnosis not present

## 2023-01-09 DIAGNOSIS — F17210 Nicotine dependence, cigarettes, uncomplicated: Secondary | ICD-10-CM | POA: Diagnosis present

## 2023-01-09 DIAGNOSIS — Z888 Allergy status to other drugs, medicaments and biological substances status: Secondary | ICD-10-CM

## 2023-01-09 DIAGNOSIS — K760 Fatty (change of) liver, not elsewhere classified: Secondary | ICD-10-CM | POA: Diagnosis present

## 2023-01-09 DIAGNOSIS — Z5900 Homelessness unspecified: Secondary | ICD-10-CM

## 2023-01-09 DIAGNOSIS — Z832 Family history of diseases of the blood and blood-forming organs and certain disorders involving the immune mechanism: Secondary | ICD-10-CM

## 2023-01-09 DIAGNOSIS — Z79899 Other long term (current) drug therapy: Secondary | ICD-10-CM

## 2023-01-09 DIAGNOSIS — I6389 Other cerebral infarction: Secondary | ICD-10-CM | POA: Diagnosis not present

## 2023-01-09 DIAGNOSIS — G4730 Sleep apnea, unspecified: Secondary | ICD-10-CM | POA: Diagnosis present

## 2023-01-09 DIAGNOSIS — Z833 Family history of diabetes mellitus: Secondary | ICD-10-CM | POA: Diagnosis not present

## 2023-01-09 DIAGNOSIS — G459 Transient cerebral ischemic attack, unspecified: Secondary | ICD-10-CM | POA: Diagnosis not present

## 2023-01-09 DIAGNOSIS — G8929 Other chronic pain: Secondary | ICD-10-CM | POA: Diagnosis present

## 2023-01-09 DIAGNOSIS — Z800 Family history of malignant neoplasm of digestive organs: Secondary | ICD-10-CM | POA: Diagnosis not present

## 2023-01-09 DIAGNOSIS — K219 Gastro-esophageal reflux disease without esophagitis: Secondary | ICD-10-CM | POA: Diagnosis present

## 2023-01-09 DIAGNOSIS — Z881 Allergy status to other antibiotic agents status: Secondary | ICD-10-CM

## 2023-01-09 DIAGNOSIS — F32A Depression, unspecified: Secondary | ICD-10-CM | POA: Diagnosis present

## 2023-01-09 DIAGNOSIS — Z91148 Patient's other noncompliance with medication regimen for other reason: Secondary | ICD-10-CM | POA: Diagnosis not present

## 2023-01-09 DIAGNOSIS — Z794 Long term (current) use of insulin: Secondary | ICD-10-CM

## 2023-01-09 DIAGNOSIS — E1165 Type 2 diabetes mellitus with hyperglycemia: Secondary | ICD-10-CM | POA: Diagnosis present

## 2023-01-09 DIAGNOSIS — Z5986 Financial insecurity: Secondary | ICD-10-CM

## 2023-01-09 DIAGNOSIS — Z9282 Status post administration of tPA (rtPA) in a different facility within the last 24 hours prior to admission to current facility: Secondary | ICD-10-CM

## 2023-01-09 DIAGNOSIS — I639 Cerebral infarction, unspecified: Secondary | ICD-10-CM | POA: Diagnosis not present

## 2023-01-09 DIAGNOSIS — E782 Mixed hyperlipidemia: Secondary | ICD-10-CM | POA: Diagnosis present

## 2023-01-09 DIAGNOSIS — R2990 Unspecified symptoms and signs involving the nervous system: Secondary | ICD-10-CM

## 2023-01-09 DIAGNOSIS — Z880 Allergy status to penicillin: Secondary | ICD-10-CM

## 2023-01-09 DIAGNOSIS — I10 Essential (primary) hypertension: Secondary | ICD-10-CM | POA: Diagnosis present

## 2023-01-09 DIAGNOSIS — Z9102 Food additives allergy status: Secondary | ICD-10-CM

## 2023-01-09 DIAGNOSIS — R531 Weakness: Secondary | ICD-10-CM | POA: Diagnosis present

## 2023-01-09 DIAGNOSIS — Z5982 Transportation insecurity: Secondary | ICD-10-CM

## 2023-01-09 LAB — COMPREHENSIVE METABOLIC PANEL
ALT: 13 U/L (ref 0–44)
AST: 14 U/L — ABNORMAL LOW (ref 15–41)
Albumin: 3.2 g/dL — ABNORMAL LOW (ref 3.5–5.0)
Alkaline Phosphatase: 61 U/L (ref 38–126)
Anion gap: 8 (ref 5–15)
BUN: 11 mg/dL (ref 6–20)
CO2: 22 mmol/L (ref 22–32)
Calcium: 9.2 mg/dL (ref 8.9–10.3)
Chloride: 108 mmol/L (ref 98–111)
Creatinine, Ser: 0.61 mg/dL (ref 0.44–1.00)
GFR, Estimated: >60 mL/min (ref >60)
Glucose, Bld: 290 mg/dL — ABNORMAL HIGH (ref 70–99)
Potassium: 3.7 mmol/L (ref 3.5–5.1)
Sodium: 138 mmol/L (ref 135–145)
Total Bilirubin: 0.8 mg/dL (ref <1.2)
Total Protein: 6.6 g/dL (ref 6.5–8.1)

## 2023-01-09 LAB — GLUCOSE, CAPILLARY
Glucose-Capillary: 267 mg/dL — ABNORMAL HIGH (ref 70–99)
Glucose-Capillary: 309 mg/dL — ABNORMAL HIGH (ref 70–99)
Glucose-Capillary: 110 mg/dL — ABNORMAL HIGH (ref 70–99)
Glucose-Capillary: 237 mg/dL — ABNORMAL HIGH (ref 70–99)

## 2023-01-09 LAB — CBC WITH DIFFERENTIAL/PLATELET
Abs Immature Granulocytes: 0.03 10*3/uL (ref 0.00–0.07)
Basophils Absolute: 0.1 10*3/uL (ref 0.0–0.1)
Basophils Relative: 1 %
Eosinophils Absolute: 0.1 10*3/uL (ref 0.0–0.5)
Eosinophils Relative: 2 %
HCT: 39.9 % (ref 36.0–46.0)
Hemoglobin: 13.6 g/dL (ref 12.0–15.0)
Immature Granulocytes: 0 %
Lymphocytes Relative: 35 %
Lymphs Abs: 3 10*3/uL (ref 0.7–4.0)
MCH: 28.3 pg (ref 26.0–34.0)
MCHC: 34.1 g/dL (ref 30.0–36.0)
MCV: 83.1 fL (ref 80.0–100.0)
Monocytes Absolute: 0.5 10*3/uL (ref 0.1–1.0)
Monocytes Relative: 6 %
Neutro Abs: 4.9 10*3/uL (ref 1.7–7.7)
Neutrophils Relative %: 56 %
Platelets: 166 10*3/uL (ref 150–400)
RBC: 4.8 MIL/uL (ref 3.87–5.11)
RDW: 12.1 % (ref 11.5–15.5)
WBC: 8.6 10*3/uL (ref 4.0–10.5)
nRBC: 0 % (ref 0.0–0.2)

## 2023-01-09 LAB — RAPID URINE DRUG SCREEN, HOSP PERFORMED
Amphetamines: NOT DETECTED
Barbiturates: NOT DETECTED
Benzodiazepines: NOT DETECTED
Cocaine: NOT DETECTED
Opiates: NOT DETECTED
Tetrahydrocannabinol: POSITIVE — AB

## 2023-01-09 LAB — HEMOGLOBIN A1C
Hgb A1c MFr Bld: 10.8 % — ABNORMAL HIGH (ref 4.8–5.6)
Mean Plasma Glucose: 263.26 mg/dL

## 2023-01-09 LAB — HIV ANTIBODY (ROUTINE TESTING W REFLEX): HIV Screen 4th Generation wRfx: NONREACTIVE

## 2023-01-09 LAB — MRSA NEXT GEN BY PCR, NASAL: MRSA by PCR Next Gen: NOT DETECTED

## 2023-01-09 MED ORDER — INSULIN ASPART 100 UNIT/ML IJ SOLN
0.0000 [IU] | Freq: Three times a day (TID) | INTRAMUSCULAR | Status: DC
  Administered 2023-01-09: 11 [IU] via SUBCUTANEOUS
  Administered 2023-01-10: 3 [IU] via SUBCUTANEOUS
  Administered 2023-01-10: 8 [IU] via SUBCUTANEOUS
  Administered 2023-01-10: 5 [IU] via SUBCUTANEOUS
  Administered 2023-01-11: 15 [IU] via SUBCUTANEOUS

## 2023-01-09 MED ORDER — NICOTINE POLACRILEX 2 MG MT GUM
2.0000 mg | CHEWING_GUM | OROMUCOSAL | Status: DC | PRN
  Administered 2023-01-09: 2 mg via ORAL
  Filled 2023-01-09 (×2): qty 1

## 2023-01-09 MED ORDER — HYDRALAZINE HCL 20 MG/ML IJ SOLN: 10.0000 mg | INTRAMUSCULAR | Status: DC | PRN

## 2023-01-09 MED ORDER — GABAPENTIN 300 MG PO CAPS
300.0000 mg | ORAL_CAPSULE | Freq: Two times a day (BID) | ORAL | Status: DC
  Administered 2023-01-09 – 2023-01-11 (×5): 300 mg via ORAL
  Filled 2023-01-09 (×5): qty 1

## 2023-01-09 MED ORDER — PANTOPRAZOLE SODIUM 40 MG PO TBEC
40.0000 mg | DELAYED_RELEASE_TABLET | Freq: Every day | ORAL | Status: DC
  Administered 2023-01-09 – 2023-01-11 (×3): 40 mg via ORAL
  Filled 2023-01-09 (×3): qty 1

## 2023-01-09 MED ORDER — STROKE: EARLY STAGES OF RECOVERY BOOK
Freq: Once | Status: AC
  Filled 2023-01-09: qty 1

## 2023-01-09 MED ORDER — SODIUM CHLORIDE 0.9 % IV SOLN: INTRAVENOUS | Status: DC

## 2023-01-09 MED ORDER — PANTOPRAZOLE SODIUM 40 MG IV SOLR: 40.0000 mg | Freq: Every day | INTRAVENOUS | Status: DC

## 2023-01-09 MED ORDER — METOPROLOL TARTRATE 12.5 MG HALF TABLET
12.5000 mg | ORAL_TABLET | Freq: Two times a day (BID) | ORAL | Status: DC
  Administered 2023-01-09 – 2023-01-11 (×5): 12.5 mg via ORAL
  Filled 2023-01-09 (×5): qty 1

## 2023-01-09 MED ORDER — ORAL CARE MOUTH RINSE: 15.0000 mL | OROMUCOSAL | Status: DC | PRN

## 2023-01-09 MED ORDER — NICOTINE 21 MG/24HR TD PT24
21.0000 mg | MEDICATED_PATCH | Freq: Every day | TRANSDERMAL | Status: DC
  Administered 2023-01-09: 21 mg via TRANSDERMAL
  Filled 2023-01-09 (×3): qty 1

## 2023-01-09 MED ORDER — ACETAMINOPHEN 325 MG PO TABS
650.0000 mg | ORAL_TABLET | Freq: Four times a day (QID) | ORAL | Status: DC | PRN
  Administered 2023-01-09 – 2023-01-10 (×5): 650 mg via ORAL
  Filled 2023-01-09 (×6): qty 2

## 2023-01-09 MED ORDER — LABETALOL HCL 5 MG/ML IV SOLN: 10.0000 mg | INTRAVENOUS | Status: DC | PRN

## 2023-01-09 MED ORDER — CLEVIDIPINE BUTYRATE 0.5 MG/ML IV EMUL: 0.0000 mg/h | INTRAVENOUS | Status: DC

## 2023-01-09 MED ORDER — CHLORHEXIDINE GLUCONATE CLOTH 2 % EX PADS
6.0000 | MEDICATED_PAD | Freq: Every day | CUTANEOUS | Status: DC
  Administered 2023-01-09 – 2023-01-11 (×2): 6 via TOPICAL

## 2023-01-09 MED ORDER — ARIPIPRAZOLE 10 MG PO TABS
10.0000 mg | ORAL_TABLET | Freq: Every day | ORAL | Status: DC
  Administered 2023-01-09 – 2023-01-11 (×3): 10 mg via ORAL
  Filled 2023-01-09 (×3): qty 1

## 2023-01-09 MED ORDER — ATORVASTATIN CALCIUM 40 MG PO TABS
40.0000 mg | ORAL_TABLET | Freq: Every day | ORAL | Status: DC
  Administered 2023-01-09 – 2023-01-11 (×3): 40 mg via ORAL
  Filled 2023-01-09 (×3): qty 1

## 2023-01-09 NOTE — Inpatient Diabetes Management (Addendum)
Inpatient Diabetes Program Recommendations  AACE/ADA: New Consensus Statement on Inpatient Glycemic Control (2015)  Target Ranges:  Prepandial:   less than 140 mg/dL      Peak postprandial:   less than 180 mg/dL (1-2 hours)      Critically ill patients:  140 - 180 mg/dL   Lab Results  Component Value Date   GLUCAP 309 (H) 01/09/2023   HGBA1C 10.8 (H) 01/09/2023    Review of Glycemic Control  Latest Reference Range & Units 01/09/23 08:58 01/09/23 11:15  Glucose-Capillary 70 - 99 mg/dL 409 (H) 811 (H)   Diabetes history: DM 2 Outpatient Diabetes medications:  Farxiga 5 mg daily (Not currently taking insulin) Current orders for Inpatient glycemic control:  Novolog 0-15 units tid with meals  Inpatient Diabetes Program Recommendations:    Note admit.  Patient had run out of [place] and was not taking insulin due to being housed in tent.  A1C is a little lower than September of 2024.   May need low dose basal insulin restarted.  She is now staying at "[facility]" and may have access to store insulin?    Consider adding Semglee 10 units daily.  Will follow.   1500- Spoke with patient at bedside.  She states that she does have a place to store insulin now if ordered at discharge.  She will need a new meter/strips as she has lost hers.  A1C is slightly lower than last check.  Patient did get new Rx. For farxiga filled. She has f/u appointment at [facility] on December 27.    Thanks,  Beryl Meager, RN, BC-ADM Inpatient Diabetes Coordinator Pager [phone]  (8a-5p)

## 2023-01-09 NOTE — Evaluation (Addendum)
Physical Therapy Evaluation Patient Details Name: Sara [name] MRN: [id number] DOB: [birth year] Today's Date: 01/09/2023  History of Present Illness  47 yo female transferred from [facility] ED received TNK for L side weakness.  PMH CVA, cocaine use disorder, cannabis use, major depressive disorder, fatty liver,sickle cell trait  Clinical Impression  Pt admitted with/for L sided weakness.  Presently, not at baseline, needing moderate assist of 1 to 2 persons dependent on activity.  Pt currently limited functionally due to the problems listed. ( See problems list.)   Pt will benefit from PT to maximize function and safety in order to get ready for next venue listed below.  Patient will benefit from continued inpatient follow up therapy, <3 hours/day due to housing difficulties.           If plan is discharge home, recommend the following: A lot of help with walking and/or transfers;A lot of help with bathing/dressing/bathroom;Assistance with cooking/housework;Assist for transportation;Help with stairs or ramp for entrance   Can travel by private vehicle   No    Equipment Recommendations Other (comment) (TBD)  Recommendations for Other Services  Rehab consult    Functional Status Assessment Patient has had a recent decline in their functional status and demonstrates the ability to make significant improvements in function in a reasonable and predictable amount of time.     Precautions / Restrictions Precautions Precautions: Fall Restrictions Weight Bearing Restrictions Per Provider Order: No      Mobility  Bed Mobility Overal bed mobility: Needs Assistance Bed Mobility: Rolling, Supine to Sit Rolling: Mod assist         General bed mobility comments: elevate from bed surface and heavy use of arms on rail    Transfers Overall transfer level: Needs assistance Equipment used: Rolling walker (2 wheels) Transfers: Sit to/from Stand Sit to Stand: +2 physical assistance, +2  safety/equipment, Mod assist                Ambulation/Gait Ambulation/Gait assistance: Mod assist, +2 physical assistance Gait Distance (Feet): 15 Feet (x2 to/from the toilet) Assistive device: Rolling walker (2 wheels) Gait Pattern/deviations: Step-to pattern, Step-through pattern, Decreased step length - right, Decreased step length - left, Decreased stance time - left, Decreased stride length, Ataxic   Gait velocity interpretation: <1.31 ft/sec, indicative of household ambulator   General Gait Details: slow ataxic, uncoordinated pattern needing stability and assist with the RW.  Stairs            Wheelchair Mobility     Tilt Bed    Modified Rankin (Stroke Patients Only) Modified Rankin (Stroke Patients Only) Pre-Morbid Rankin Score: No symptoms Modified Rankin: Moderately severe disability     Balance Overall balance assessment: Needs assistance Sitting-balance support: Bilateral upper extremity supported, Feet supported Sitting balance-Leahy Scale: Poor     Standing balance support: Bilateral upper extremity supported, During functional activity, Reliant on assistive device for balance Standing balance-Leahy Scale: Poor                               Pertinent Vitals/Pain      Home Living Family/patient expects to be discharged to:: Group home   Available Help at Discharge: Family (son in [place], brother) Type of Home: House Home Access: Ramped entrance       Home Layout: One level Home Equipment: Shower seat;Grab bars - tub/shower;Grab bars - toilet Additional Comments: unhoused, shelter in [place], [place] mother in the facility  Prior Function Prior Level of Function : Independent/Modified Independent               ADLs Comments: reports that she has morning dish duties as her chore     Extremity/Trunk Assessment   Upper Extremity Assessment Upper Extremity Assessment: Defer to OT evaluation    Lower Extremity  Assessment Lower Extremity Assessment: LLE deficits/detail LLE Deficits / Details: grossly 3/5, ataxic, incoordinated.  able to bear weight for gait.       Communication   Communication Communication: Other (comment) (reports the stutter to her speech is new and change from baseline. one word answers very clear without deficits "yes no")  Cognition Arousal: Alert Behavior During Therapy: WFL for tasks assessed/performed Overall Cognitive Status: No family/caregiver present to determine baseline cognitive functioning                                          General Comments      Exercises Other Exercises Other Exercises: pt with ataxic movement. pt with knee buckle but able to correct with RW   Assessment/Plan    PT Assessment Patient needs continued PT services  PT Problem List Decreased strength;Decreased activity tolerance;Decreased balance;Decreased mobility;Decreased coordination;Decreased knowledge of use of DME       PT Treatment Interventions DME instruction;Gait training;Stair training;Functional mobility training;Therapeutic activities;Balance training;Neuromuscular re-education;Patient/family education    PT Goals (Current goals can be found in the Care Plan section)  Acute Rehab PT Goals Patient Stated Goal: Be able to move/walk better. PT Goal Formulation: With patient Time For Goal Achievement: 01/23/23 Potential to Achieve Goals: Fair    Frequency Min 1X/week     Co-evaluation               AM-PAC PT "6 Clicks" Mobility  Outcome Measure Help needed turning from your back to your side while in a flat bed without using bedrails?: A Lot Help needed moving from lying on your back to sitting on the side of a flat bed without using bedrails?: A Lot Help needed moving to and from a bed to a chair (including a wheelchair)?: Total Help needed standing up from a chair using your arms (e.g., wheelchair or bedside chair)?: Total Help needed  to walk in hospital room?: Total Help needed climbing 3-5 steps with a railing? : Total 6 Click Score: 8    End of Session Equipment Utilized During Treatment: Gait belt Activity Tolerance: Patient tolerated treatment well Patient left: in chair;with call bell/phone within reach   PT Visit Diagnosis: Other abnormalities of gait and mobility (R26.89);Ataxic gait (R26.0);Difficulty in walking, not elsewhere classified (R26.2)    Time: 1255-1320 PT Time Calculation (min) (ACUTE ONLY): 25 min   Charges:   PT Evaluation $PT Eval Moderate Complexity: 1 Mod   PT General Charges $$ ACUTE PT VISIT: 1 Visit         01/09/2023  Jacinto Halim., PT Acute Rehabilitation Services [phone]  (office)  Eliseo Gum Ronique Simerly 01/09/2023, 7:47 PM

## 2023-01-09 NOTE — Progress Notes (Signed)
OT Cancellation Note  Patient Details Name: Sara [name] MRN: [id number] DOB: 15-Feb-1975   Cancelled Treatment:    Reason Eval/Treat Not Completed: Active bedrest order  [name] 01/09/2023, 12:19 PM

## 2023-01-09 NOTE — H&P (Signed)
NEUROLOGY H&P NOTE   Date of service: January 09, 2023 Patient Name: Sara [name] MRN:  [id number] DOB:  1975/08/07 Chief Complaint: "Left sided weakness"  History of Present Illness  Sara [name] is a 47 y.o. 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 pylori infection, High risk medication use, Hypercholesterolemia, Hyperlipidemia, mixed, Hypertension, Other mixed anxiety disorders, Pollen allergies, Sickle cell trait (HCC), Sleep apnea, and Vitamin D deficiency., who presents to [facility] in transfer from the [facility] ED where she received TNK for acute onset of left sided weakness. The patient is homeless. On arrival to [facility] she was noted to be hyperglycemic with glucose of 326 but not in DKA or HHS. Teleneurology was consulted for new onset of left sided weakness. NIHSS 9. CT head was negative for hemorrhage. CTA was negative for LVO. She was deemed to be eligible for TNK which was administered at [facility]. She has been transferred to [facility] for post-TNK management and stroke work up.  LKN and labs as per documentation from [facility]. Currently with a bifrontal 4/10 headache.   NIHSS components Score: Comment  1a Level of Conscious 0[x]  1[]  2[]  3[]      1b LOC Questions 0[x]  1[]  2[]       1c LOC Commands 0[x]  1[]  2[]       2 Best Gaze 0[x]  1[]  2[]       3 Visual 0[x]  1[]  2[]  3[]      4 Facial Palsy 0[x]  1[]  2[]  3[]      5a Motor Arm - left 0[]  1[]  2[x]  3[]  4[]  UN[]    5b Motor Arm - Right 0[x]  1[]  2[]  3[]  4[]  UN[]    6a Motor Leg - Left 0[]  1[x]  2[]  3[]  4[]  UN[]    6b Motor Leg - Right 0[x]  1[]  2[]  3[]  4[]  UN[]    7 Limb Ataxia 0[x]  1[]  2[]  3[]  UN[]     8 Sensory 0[]  1[x]  2[]  UN[]      9 Best Language 0[x]  1[]  2[]  3[]      10 Dysarthria 0[x]  1[]  2[]  UN[]      11 Extinct. 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 pylori infection    High risk medication use    Hypercholesterolemia    Hyperlipidemia, mixed    Hypertension    Other mixed anxiety disorders    Pollen allergies    Sickle cell trait (HCC)    Sleep apnea    Vitamin D deficiency    Past Surgical History:  Procedure Laterality Date   ABDOMINAL HYSTERECTOMY     APPENDECTOMY     COLONOSCOPY  08-16-2008   Dr. Vallarie Mare   ESOPHAGOGASTRODUODENOSCOPY  08-16-2008   Dr. Rayfield Citizen    Family History  Problem Relation Age of Onset   Clotting disorder Mother    Crohn's disease Mother    Heart disease Mother    Colon cancer Father    Clotting disorder Brother    Diabetes Brother    Diabetes Maternal  Aunt    Liver cancer Maternal Uncle    Prostate cancer Maternal Uncle    Colon cancer Maternal Uncle    Diabetes Paternal Aunt    Esophageal cancer Paternal Uncle    Social History   Socioeconomic History   Marital status: Divorced    Spouse name: Not on file   Number of children: 2   Years of education: Not on file   Highest education level: Not on file  Occupational History   Occupation: n/a  Tobacco Use   Smoking status: Every Day    Current packs/day: 1.00    Types: Cigarettes   Smokeless tobacco: Never   Tobacco comments:    Will order nicotine gum  Substance and Sexual Activity   Alcohol use: Not Currently   Drug use: Not Currently    Types: Marijuana, Cocaine    Comment: 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   Sexual activity: Not Currently  Other Topics Concern   Not on file  Social History Narrative   Not on file   Social Drivers of Health   Financial Resource Strain: High Risk (12/19/2022)   Received from [facility]   Overall  Financial Resource Strain (CARDIA)    Difficulty of Paying Living Expenses: Hard  Food Insecurity: No Food Insecurity (01/06/2023)   Hunger Vital Sign    Worried About Running Out of Food in the Last Year: Never true    Ran Out of Food in the Last Year: Never true  Recent Concern: Food Insecurity - Food Insecurity Present (01/06/2023)   Hunger Vital Sign    Worried About Running Out of Food in the Last Year: Sometimes true    Ran Out of Food in the Last Year: Sometimes true  Transportation Needs: Unmet Transportation Needs (01/06/2023)   PRAPARE - Administrator, Civil Service (Medical): Yes    Lack of Transportation (Non-Medical): Yes  Physical Activity: Sufficiently Active (01/21/2022)   Received from [facility], [facility]   Exercise Vital Sign    Days of Exercise per Week: 7 days    Minutes of Exercise per Session: 30 min  Stress: Stress Concern Present (01/21/2022)   Received from [facility], [facility]   [employer] [facility] - Occupational Stress Questionnaire    Feeling of Stress : Very much  Social Connections: Socially Isolated (01/21/2022)   Received from [facility], [facility]   Social Network    How would you rate your social network (family, work, friends)?: Little participation, lonely and socially isolated   Allergies  Allergen Reactions   Clarithromycin Itching and Rash   Doxycycline Anaphylaxis and Rash   Penicillins Anaphylaxis and Rash   Tetracyclines & Related Anaphylaxis   Metronidazole Itching and Rash   Red Dye #40 (Allura Red) Itching    Likely red dye allergy - rx with Tylenol elixir, can tolerate tablets    Medications   Current Facility-Administered Medications:    Chlorhexidine Gluconate Cloth 2 % PADS 6 each, 6 each, Topical, Daily, Caryl Pina, MD   Oral care mouth rinse, 15 mL, Mouth Rinse, PRN, Caryl Pina, MD   Current Outpatient Medications  Medication Instructions   ARIPiprazole  (ABILIFY) 10 mg, Oral, Daily   FLUoxetine (PROZAC) 20 mg, Oral, Daily   gabapentin (NEURONTIN) 600 mg, Oral, 3 times daily   hydrOXYzine (ATARAX) 25 mg, Oral, Every 6 hours PRN   insulin aspart (NOVOLOG) 0-15 Units, Subcutaneous, 3 times daily with meals  insulin aspart (NOVOLOG) 0-5 Units, Subcutaneous, Daily at bedtime   insulin glargine-yfgn (SEMGLEE) 16 Units, Subcutaneous, Daily at bedtime   linaclotide (LINZESS) 145 mcg, Oral, Daily before breakfast   metoprolol succinate (TOPROL-XL) 12.5 mg, Oral, Daily   nicotine polacrilex (NICORETTE) 2 mg, Oral, As needed   oxyCODONE-acetaminophen (PERCOCET/ROXICET) 5-325 MG tablet 1 tablet, Oral, Every 8 hours PRN   pantoprazole (PROTONIX) 40 mg, Oral, Daily     Vitals   Vitals:   01/09/23 0604 01/09/23 0618 01/09/23 0630  BP: (!) 184/104 (!) 156/86 (!) 155/92  Pulse: (!) 104 (!) 102 (!) 102  Resp: 20 14 13   Temp: 98.4 F (36.9 C)    TempSrc: Oral    SpO2: 98% 98% 99%  Weight: 67.8 kg    Height: 5\' 4"  (1.626 m)       Body mass index is 25.66 kg/m.  Physical Exam   Physical Exam  HEENT-  Brilliant/AT    Lungs- Respirations unlabored Extremities- No edema  Neurological Examination Mental Status: Alert, oriented x 5, thought content appropriate.  Speech fluent without evidence of aphasia.  Able to follow all commands without difficulty. Cranial Nerves: II: Temporal visual fields intact with no extinction to DSS. PERRL  III,IV, VI: No ptosis. EOMI.  V: Temp sensation equal bilaterally  VII: Smile symmetric VIII: Hearing intact to voice IX,X: No hoarseness XI: Symmetric shoulder shrug XII: Midline tongue extension Motor: BUE able to hold antigravity for 7 seconds before abruptly dropping each limb. Against resistance RUE is 5/5 and LUE is with giveway weakness fluctuating between 4-/5 and 5/5, improving with coaching.  Tremulous movements of LUE and effortful affect noted when LUE is held by patient antigravity Gesticulates  normally with LUE during conversation when distracted RLE 5/5 proximally and distally  LLE holds antigravity for 5 seconds with bobbing drift. Max strength against resistance to knee extension is 4/5 with giveway, ADF is 5/5 Sensory: Subjectively with decreased temp and FT sensation on the left. Extinction on the left with DSS. Marland Kitchen  Deep Tendon Reflexes: 2+ and symmetric throughout Cerebellar: No ataxia with FNF bilaterally, but with prominent bilateral tremor that waxes and wanes in frequency and amplitude, going away completely when distracted.  Gait: Deferred  Labs   CBC: No results for input(s): "WBC", "NEUTROABS", "HGB", "HCT", "MCV", "PLT" in the last 168 hours. Basic Metabolic Panel:  Lab Results  Component Value Date   NA 135 06/17/2022   K 4.2 06/17/2022   CO2 24 06/17/2022   GLUCOSE 217 (H) 06/17/2022   BUN 24 (H) 06/17/2022   CREATININE 0.96 06/17/2022   CALCIUM 9.2 06/17/2022   GFRNONAA >60 06/17/2022   GFRAA  12/11/2009    >60        The eGFR has been calculated using the MDRD equation. This calculation has not been validated in all clinical situations. eGFR's persistently <60 mL/min signify possible Chronic Kidney Disease.   Lipid Panel:  Lab Results  Component Value Date   LDLCALC 194 (H) 10/15/2022   HgbA1c:  Lab Results  Component Value Date   HGBA1C 11.2 (H) 10/15/2022   Urine Drug Screen:     Component Value Date/Time   LABOPIA NONE DETECTED 07/20/2022 1632   COCAINSCRNUR NONE DETECTED 07/20/2022 1632   LABBENZ NONE DETECTED 07/20/2022 1632   AMPHETMU NONE DETECTED 07/20/2022 1632   THCU NONE DETECTED 07/20/2022 1632   LABBARB NONE DETECTED 07/20/2022 1632    Alcohol Level     Component Value Date/Time   ETH <  10 06/17/2022 1937   INR No results found for: "INR" APTT No results found for: "APTT"   Assessment  CHACE FLEER is a 47 y.o. female with a past medical history of Acid reflux, Chronic pain, Depression, Diabetes type 2 with  atherosclerosis of arteries of extremities (HCC), Fatty liver, Gastritis (2010), Gastritis, H. pylori infection, High risk medication use, Hypercholesterolemia, Hyperlipidemia, mixed, Hypertension, Other mixed anxiety disorders, Pollen allergies, Sickle cell trait (HCC), Sleep apnea, and Vitamin D deficiency., who presents to [facility] in transfer from the [facility] ED where she received TNK for acute onset of left sided weakness. The patient is homeless. On arrival to [facility] she was noted to be hyperglycemic with glucose of 326 but not in DKA or HHS. Teleneurology was consulted for new onset of left sided weakness. NIHSS 9. CT head was negative for hemorrhage. CTA was negative for LVO. She was deemed to be eligible for TNK which was administered at [facility]. She has been transferred to [facility] for post-TNK management and stroke work up.  LKN and labs as per documentation from [facility]. Currently with a bifrontal 4/10 headache.  - Exam reveals left sided giveway weakness and intermittent stuttering of her speech that are suggestive of a possible functional etiology for her presentation. NIHSS 4.  - Stroke risk factors: DM, sickle cell trait, HLD, HTN, family history of clotting disorder (mother and brother), smoking and sleep apnea - Overall impression:  - Stroke symptoms s/p TNK. 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 Admitting to Neuro ICU.  2. Post-TNK order set to include frequent neuro checks and BP management.  3. No antiplatelet medications or anticoagulants for at least 24 hours following TNK.  4. DVT prophylaxis with SCDs.  5. Will need to be started on a statin.  6. Will need to be started on antiplatelet therapy if follow up CT at 24 hours is negative for hemorrhagic conversion. 7. Cardiac telemetry.  8. TTE.  9. MRI brain.  10. PT/OT/Speech.  11. NPO until passes swallow evaluation.  12. Sliding scale insulin.  13. Fasting lipid  panel, HgbA1c 14. May need Case Management consult for assistance obtaining medications as she is homeless and has been noncompliant, most likely due to financial issues.   ______________________________________________________________________  Addendum (01/12/23 1610): - Correction to HPI: The patient received tPA at [facility], not TNK. This was verified with Pharmacy at [facility] - tPA was administered at 0124   Signed, Otelia Limes, Derica Leiber, MD [facility]

## 2023-01-09 NOTE — Progress Notes (Signed)
Reviewing for TPA administration/documentation time:  Per paper chart from [facility], MD [facility] made decision to give TPA at 0058; TPA could not be administered until BP was within parameters. 20mg  labetalol given at 0103 and 10mg  labetalol given at 0123. Nicardipine gtt started at 0115. TPA bolus given at 0124, subsequent gtt started at 0125.  Meryl Dare, SRN

## 2023-01-09 NOTE — Progress Notes (Signed)
OT NOTE PT admitted with L side weakness pending MRI . Pt currently with functional limitiations due to the deficits listed below (see OT problem list).  Pt at baseline indep with all adls. Pt at this time requries two person (A) due to balance deficits and ataxic movement.  Pt will benefit from skilled OT to increase their independence and safety with adls and balance to allow discharge Patient will benefit from intensive inpatient follow up therapy, >3 hours/day .   01/09/23 1200  OT Visit Information  Last OT Received On 01/09/23  Assistance Needed +1  Reason Eval/Treat Not Completed Active bedrest order  History of Present Illness 47 yo female transferred from [facility] ED received TNK for L side weakness.  PMH CVA, cocaine use disorder, cannabis use, major depressive disorder, fatty liver,sickle cell trait  Precautions  Precautions Fall  Restrictions  Weight Bearing Restrictions Per Provider Order No  Home Living  Family/patient expects to be discharged to: Group home  Available Help at Discharge Family (son in [place], brother)  Type of Home House  Home Access Ramped entrance  Home Layout One level  Bathroom Shower/Tub Walk-in shower  Bathroom Toilet Handicapped height  Home Equipment Shower seat;Grab bars - tub/shower;Grab bars - toilet  Additional Comments unhoused, shelter in [place], [place] mother in the facility  Prior Function  Prior Level of Function  Independent/Modified Independent  ADLs Comments reports that she has morning dish duties as her chore  Pain Assessment  Pain Assessment No/denies pain  Cognition  Arousal Alert  Behavior During Therapy WFL for tasks assessed/performed  Overall Cognitive Status No family/caregiver present to determine baseline cognitive functioning  Communication  Communication Other (comment) (reports the stutter to her speech is new and change from baseline. one word answers very clear without deficits "yes no")  Upper Extremity Assessment   Upper Extremity Assessment Left hand dominant;LUE deficits/detail  LUE Deficits / Details decrease coordination  LUE Sensation decreased light touch  LUE Coordination decreased gross motor;decreased fine motor  Lower Extremity Assessment  Lower Extremity Assessment Defer to PT evaluation  Vision- History  Baseline Vision/History 1 Wears glasses  Vision- Assessment  Vision Assessment? Wears glasses for reading  Additional Comments reports light sensitive at this time and prefers the room dark  ADL  Overall ADL's  Needs assistance/impaired  Eating/Feeding Supervision/ safety;Sitting  Grooming Supervision/safety;Sitting  Toilet Transfer +2 for physical assistance;Moderate assistance;Ambulation;Regular Toilet;Grab bars;Rolling walker (2 wheels)  Toileting- Clothing Manipulation and Hygiene Contact guard assist;Sitting/lateral lean  Functional mobility during ADLs Moderate assistance;+2 for physical assistance;+2 for safety/equipment;Rolling walker (2 wheels)  Bed Mobility  Overal bed mobility Needs Assistance  Bed Mobility Rolling;Supine to Sit  Rolling Mod assist  General bed mobility comments elevate from bed surface and heavy use of arms on rail  Transfers  Overall transfer level Needs assistance  Equipment used Rolling walker (2 wheels)  Transfers Sit to/from Stand  Sit to Stand +2 physical assistance;+2 safety/equipment;Mod assist  Balance  Overall balance assessment Needs assistance  Sitting-balance support Bilateral upper extremity supported;Feet supported  Sitting balance-Leahy Scale Poor  Standing balance support Bilateral upper extremity supported;During functional activity;Reliant on assistive device for balance  Standing balance-Leahy Scale Poor  General Comments  General comments (skin integrity, edema, etc.) VSS  Exercises  Exercises Other exercises  Other Exercises  Other Exercises pt with ataxic movement. pt with knee buckle but able to correct with RW  OT - End  of Session  Equipment Utilized During Treatment Gait belt;Rolling walker (2 wheels)  Activity Tolerance Patient tolerated treatment well  Patient left in chair;with call bell/phone within reach;with chair alarm set  Nurse Communication Mobility status;Precautions  OT Assessment  OT Recommendation/Assessment Patient needs continued OT Services  OT Visit Diagnosis Unsteadiness on feet (R26.81);Muscle weakness (generalized) (M62.81)  OT Problem List Decreased strength;Decreased activity tolerance;Impaired balance (sitting and/or standing);Decreased cognition;Decreased safety awareness;Decreased knowledge of use of DME or AE;Decreased knowledge of precautions  OT Plan  OT Frequency (ACUTE ONLY) Min 1X/week  OT Treatment/Interventions (ACUTE ONLY) Self-care/ADL training;Therapeutic exercise;Energy conservation;Neuromuscular education;DME and/or AE instruction;Manual therapy;Modalities;Therapeutic activities;Cognitive remediation/compensation;Patient/family education;Balance training  AM-PAC OT "6 Clicks" Daily Activity Outcome Measure (Version 2)  Help from another person eating meals? 3  Help from another person taking care of personal grooming? 3  Help from another person toileting, which includes using toliet, bedpan, or urinal? 3  Help from another person bathing (including washing, rinsing, drying)? 3  Help from another person to put on and taking off regular upper body clothing? 3  Help from another person to put on and taking off regular lower body clothing? 2  6 Click Score 17  Progressive Mobility  What is the highest level of mobility based on the progressive mobility assessment? Level 4 (Walks with assist in room) - Balance while marching in place and cannot step forward and back - Complete  Mobility Referral No  Activity Moved into chair position in bed  OT Recommendation  Recommendations for Other Services Rehab consult  Follow Up Recommendations Acute inpatient rehab (3hours/day)   Patient can return home with the following A little help with walking and/or transfers;A little help with bathing/dressing/bathroom  Functional Status Assessent Patient has had a recent decline in their functional status and demonstrates the ability to make significant improvements in function in a reasonable and predictable amount of time.  OT Equipment Other (comment) (RW)  Individuals Consulted  Consulted and Agree with Results and Recommendations Patient  Acute Rehab OT Goals  Patient Stated Goal to situp and eat  OT Goal Formulation With patient  Time For Goal Achievement 01/23/23  Potential to Achieve Goals Good  OT Time Calculation  OT Start Time (ACUTE ONLY) 1255  OT Stop Time (ACUTE ONLY) 1320  OT Time Calculation (min) 25 min  OT General Charges  $OT Visit 1 Visit  OT Evaluation  $OT Eval Moderate Complexity 1 Mod   Brynn, OTR/L  Acute Rehabilitation Services Office: [phone] .

## 2023-01-09 NOTE — Progress Notes (Signed)
  Inpatient Rehab Admissions Coordinator :  Per therapy recommendations, patient was screened for CIR candidacy by Ottie Glazier RN MSN. Noted patient on 12/10 with congregational Nursing to reside in [facility] recently. Was in a tent prior to that placement.  At this time patient not appropriate for CIR level rehab for is unhoused and needs housing,follow up care, medications, etc. Other rehab venues should be pursued. Please call me with any questions.  Ottie Glazier RN MSN Admissions Coordinator [phone]

## 2023-01-09 NOTE — Progress Notes (Addendum)
STROKE TEAM PROGRESS NOTE   BRIEF HPI Ms. Sara [name] is a 47 y.o. 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 pylori infection, High risk medication use, Hypercholesterolemia, Hyperlipidemia, mixed, Hypertension, Sickle cell trait, Sleep apnea, and Vitamin D deficiency who presents to [facility] in transfer from the [facility] ED where she received TNK (0124) for acute onset of left sided weakness.   NIH on Admission 5  SIGNIFICANT HOSPITAL EVENTS   INTERIM HISTORY/SUBJECTIVE Stroke symptoms improving. MRI tomorrow overnight. Headache improving.  Hemodynamically stable, no neurological complaints at this time.  May work with physical therapy. Does endorse difficulty getting her medications as she is homeless  OBJECTIVE  CBC    Component Value Date/Time   WBC 9.3 06/17/2022 1937   RBC 4.54 06/17/2022 1937   HGB 13.4 06/17/2022 1937   HCT 39.9 06/17/2022 1937   PLT 198 06/17/2022 1937   MCV 87.9 06/17/2022 1937   MCH 29.5 06/17/2022 1937   MCHC 33.6 06/17/2022 1937   RDW 11.9 06/17/2022 1937   LYMPHSABS 3.9 06/17/2022 1937   MONOABS 0.5 06/17/2022 1937   EOSABS 0.1 06/17/2022 1937   BASOSABS 0.1 06/17/2022 1937    BMET    Component Value Date/Time   NA 135 06/17/2022 1937   K 4.2 06/17/2022 1937   CL 103 06/17/2022 1937   CO2 24 06/17/2022 1937   GLUCOSE 217 (H) 06/17/2022 1937   BUN 24 (H) 06/17/2022 1937   CREATININE 0.96 06/17/2022 1937   CALCIUM 9.2 06/17/2022 1937   GFRNONAA >60 06/17/2022 1937    IMAGING past 24 hours No results found.  Vitals:   01/09/23 0630 01/09/23 0700 01/09/23 0730 01/09/23 0800  BP: (!) 155/92 122/79 (!) 144/90 (!) 156/97  Pulse: (!) 102 (!) 104 100 (!) 104  Resp: 13 19 18 14   Temp:      TempSrc:      SpO2: 99% 99% 98% 96%  Weight:      Height:         PHYSICAL EXAM General:  Alert, well-nourished, well-developed patient in  no acute distress Psych:  Mood and affect appropriate for situation CV: Regular rate and rhythm on monitor Respiratory:  Regular, unlabored respirations on room air GI: Abdomen soft and nontender   NEURO:  Mental Status: AA&Ox3, patient is able to give clear and coherent history Speech/Language: Paucity of speech, endorses difficulty getting the words she wants to say out.  Naming, repetition, fluency, and comprehension intact.  Cranial Nerves:  II: PERRL. Visual fields full.  III, IV, VI: EOMI. Eyelids elevate symmetrically.  V: Sensation is intact to light touch and symmetrical to face.  VII: Face is symmetrical resting and smiling VIII: hearing intact to voice. IX, X: Palate elevates symmetrically. Phonation is normal.  YN:[id number] shrug 5/5. XII: tongue is midline without fasciculations. Motor:  LUE and LLE with give way weakness RUE and RLE with full strength  Tone: is normal and bulk is normal Sensation- subjective diminished sensation on the left upper and lower extremity  Coordination: FTN intact bilaterally, HKS: no ataxia in BLE.No drift.  Gait- deferred  Most Recent NIH 3     ASSESSMENT/PLAN  Stroke like symptoms s/p TNK Possible functional neurological disorder, need to rule out stroke as pt dose have uncontrolled stroke risk factors Neuro exam showed functional component Code Stroke CT head - No acute intracranial pathology CTA head & neck No acute cranial  or cervical pathology noted MRI  Pending 2D Echo pending  LDL pending HgbA1c 10.8 VTE prophylaxis - SCDs No antithrombotic prior to admission, now on No antithrombotic until 24 hours post TNK Therapy recommendations:  Pending Disposition:  Pending   Hypertension Home meds:  Metoprolol Stable Long term BP goal normotensive  Hyperlipidemia Home meds none LDL pending goal < 70 Add Atorvastatin 40mg   Continue statin at discharge  Diabetes type II Uncontrolled Home meds:  insulin, Farxiga HgbA1c  10.8, goal < 7.0 CBGs SSI Recommend close follow-up with PCP for better DM control  Tobacco Abuse Patient smokes cigarretes      Ready to quit? No Nicotine replacement therapy provided  Other Stroke Risk Factors ETOH use, alcohol level <10, advised to drink no more than 1 drink(s) a day  Other Active Problems Homelessness- medication noncompliance due to difficulty obtaining medcations TOC consult placed  Hospital day # 0  Patient seen and examined by NP/APP with MD. MD to update note as needed.   Elmer Picker, DNP, FNP-BC [facility] Pager: [phone]  ATTENDING NOTE: I reviewed above note and agree with the assessment and plan. Pt was seen and examined.   No family at the bedside. Pt sitting in bed, stated that she still has left sided weakness but seems some better than before. However on exam, significant giveaway weakness on the left, mild giveaway weakness on the right. B/l FTN psychogenic movement. AAO x 3 with hesitant speech and stuttering. Pending MRI tonight. Put on lipitor. Aggressive risk factor modification. However, pt would not want to quit smoking. PT and OT pending.  For detailed assessment and plan, please refer to above/below as I have made changes wherever appropriate.   Marvel Plan, MD PhD Stroke Neurology 01/09/2023 10:27 PM  This patient is critically ill due to stroke like symptoms s/p TNK and at significant risk of neurological worsening, death form bleeding from TNK. This patient's care requires constant monitoring of vital signs, hemodynamics, respiratory and cardiac monitoring, review of multiple databases, neurological assessment, discussion with family, other specialists and medical decision making of high complexity. I spent 30 minutes of neurocritical care time in the care of this patient.    To contact Stroke Continuity provider, please refer to [email or web address]. After hours, contact General Neurology

## 2023-01-09 NOTE — Evaluation (Signed)
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 pylori infection    High risk medication use    Hypercholesterolemia    Hyperlipidemia, mixed    Hypertension    Other mixed anxiety disorders    Pollen allergies    Sickle cell trait (HCC)    Sleep apnea    Vitamin D deficiency    Past Surgical History:  Past Surgical History:  Procedure Laterality Date   ABDOMINAL HYSTERECTOMY     APPENDECTOMY     COLONOSCOPY  08-16-2008   Dr. Vallarie Mare   ESOPHAGOGASTRODUODENOSCOPY  08-16-2008   Dr. Rayfield Citizen    HPI:      Assessment / Plan / Recommendation Clinical Impression  Cognitive-linguistic evaluation complete, somewhat limited as patient also eating breakfast and with somewhat inconsistent deficits. Patient is alert and oriented x 4. Intellectual Awareness and basic problem solving, as well as moderately complex mathmatical reasoning appear intact, however safety awareness and decision making as it related to acute dx mildly impaired and short term recall of new information moderately impaired.  Verbal expression intact and patient 100% intelligible however does present with  an inconsistent stutter at word onset that she  reports is new, possible neurogenic related to suspected CVA although with inconsistency of symptoms cannot r/o psychogenic factors. Will benefit from SLP f/u to maximize cognitive-linguistic abilities as patient being further evaluated for potential CVA.    SLP Assessment  SLP Recommendation/Assessment: Patient needs continued Speech Lanaguage Pathology Services SLP Visit Diagnosis: Cognitive communication deficit (R41.841)    Recommendations for follow up therapy are one component of a multi-disciplinary discharge planning process, led by the attending physician.  Recommendations may be updated based on patient status, additional functional criteria and insurance authorization.             Frequency and Duration min 2x/week  2 weeks      SLP Evaluation Cognition  Overall Cognitive Status: No family/caregiver present to determine baseline cognitive functioning Arousal/Alertness: Awake/alert Orientation Level: Oriented X4 Memory: Impaired Memory Impairment: Storage deficit;Retrieval deficit Awareness: Appears intact Problem Solving: Appears intact Executive Function: Reasoning;Decision Making Reasoning: Impaired Reasoning Impairment: Verbal basic Decision Making: Impaired Decision Making Impairment: Verbal basic Safety/Judgment: Impaired Comments: decreased safety awareness       Comprehension  Auditory Comprehension Overall Auditory Comprehension: Appears within functional limits for tasks assessed Visual Recognition/Discrimination Discrimination: Within Function Limits Reading Comprehension Reading Status: Within funtional limits (for basic signage in room)    Expression Expression Primary Mode of Expression: Verbal Verbal Expression Overall Verbal Expression: Appears within functional limits for tasks assessed Initiation: No impairment Automatic Speech: Name;Social Response Level of Generative/Spontaneous Verbalization: Conversation Repetition: No  impairment Naming: No impairment Pragmatics: No impairment Written Expression  Dominant Hand: Left   Oral / Motor  Oral Motor/Sensory Function Overall Oral Motor/Sensory Function: Within functional limits Motor Speech Overall Motor Speech: Impaired Respiration: Within functional limits Phonation: Normal Resonance: Within functional limits Articulation: Within functional limitis Intelligibility: Intelligible Motor Planning: Impaired Level of Impairment: Word Motor Speech Errors: Groping for words (neurogenic stuttering)           Ferdinand Lango MA, CCC-SLP  Promiss Labarbera Meryl 01/09/2023, 11:07 AM

## 2023-01-10 ENCOUNTER — Other Ambulatory Visit (HOSPITAL_COMMUNITY): Payer: Self-pay

## 2023-01-10 ENCOUNTER — Inpatient Hospital Stay (HOSPITAL_COMMUNITY): Payer: MEDICAID

## 2023-01-10 DIAGNOSIS — I6389 Other cerebral infarction: Secondary | ICD-10-CM | POA: Diagnosis not present

## 2023-01-10 DIAGNOSIS — I639 Cerebral infarction, unspecified: Secondary | ICD-10-CM | POA: Diagnosis not present

## 2023-01-10 LAB — ECHOCARDIOGRAM COMPLETE
AR max vel: 3.14 cm2
AV Area VTI: 2.55 cm2
AV Area mean vel: 2.89 cm2
AV Mean grad: 3 mm[Hg]
AV Peak grad: 5.9 mm[Hg]
Ao pk vel: 1.21 m/s
Area-P 1/2: 4.49 cm2
Calc EF: 54.9 %
Height: 64 in
S' Lateral: 2.4 cm
Single Plane A2C EF: 53.7 %
Single Plane A4C EF: 56.5 %
Weight: 2391.55 [oz_av]

## 2023-01-10 LAB — GLUCOSE, CAPILLARY
Glucose-Capillary: 298 mg/dL — ABNORMAL HIGH (ref 70–99)
Glucose-Capillary: 171 mg/dL — ABNORMAL HIGH (ref 70–99)
Glucose-Capillary: 204 mg/dL — ABNORMAL HIGH (ref 70–99)
Glucose-Capillary: 279 mg/dL — ABNORMAL HIGH (ref 70–99)

## 2023-01-10 LAB — CBC
HCT: 37.7 % (ref 36.0–46.0)
Hemoglobin: 13 g/dL (ref 12.0–15.0)
MCH: 28.6 pg (ref 26.0–34.0)
MCHC: 34.5 g/dL (ref 30.0–36.0)
MCV: 82.9 fL (ref 80.0–100.0)
Platelets: 163 10*3/uL (ref 150–400)
RBC: 4.55 MIL/uL (ref 3.87–5.11)
RDW: 12.1 % (ref 11.5–15.5)
WBC: 7.8 10*3/uL (ref 4.0–10.5)
nRBC: 0 % (ref 0.0–0.2)

## 2023-01-10 LAB — BASIC METABOLIC PANEL
Anion gap: 4 — ABNORMAL LOW (ref 5–15)
BUN: 10 mg/dL (ref 6–20)
CO2: 21 mmol/L — ABNORMAL LOW (ref 22–32)
Calcium: 8.7 mg/dL — ABNORMAL LOW (ref 8.9–10.3)
Chloride: 106 mmol/L (ref 98–111)
Creatinine, Ser: 0.63 mg/dL (ref 0.44–1.00)
GFR, Estimated: >60 mL/min (ref >60)
Glucose, Bld: 268 mg/dL — ABNORMAL HIGH (ref 70–99)
Potassium: 3.8 mmol/L (ref 3.5–5.1)
Sodium: 131 mmol/L — ABNORMAL LOW (ref 135–145)

## 2023-01-10 LAB — LIPID PANEL
Cholesterol: 215 mg/dL — ABNORMAL HIGH (ref 0–200)
HDL: 51 mg/dL (ref >40)
LDL Cholesterol: 148 mg/dL — ABNORMAL HIGH (ref 0–99)
Total CHOL/HDL Ratio: 4.2 {ratio}
Triglycerides: 79 mg/dL (ref <150)
VLDL: 16 mg/dL (ref 0–40)

## 2023-01-10 MED ORDER — POLYETHYLENE GLYCOL 3350 17 G PO PACK
17.0000 g | PACK | Freq: Every day | ORAL | Status: AC
  Administered 2023-01-10 – 2023-01-11 (×2): 17 g via ORAL
  Filled 2023-01-10 (×2): qty 1

## 2023-01-10 MED ORDER — ASPIRIN 81 MG PO TBEC
81.0000 mg | DELAYED_RELEASE_TABLET | Freq: Every day | ORAL | Status: DC
Start: 2023-01-10 — End: 2023-01-11
  Administered 2023-01-10 – 2023-01-11 (×2): 81 mg via ORAL
  Filled 2023-01-10 (×2): qty 1

## 2023-01-10 NOTE — Progress Notes (Signed)
Echocardiogram 2D Echocardiogram has been performed.  Kline Bulthuis N Osamu Olguin,RDCS 01/10/2023, 3:46 PM

## 2023-01-10 NOTE — Plan of Care (Signed)
  Problem: Pain Management: Goal: General experience of comfort will improve Outcome: Progressing   Problem: Safety: Goal: Ability to remain free from injury will improve Outcome: Progressing

## 2023-01-10 NOTE — Progress Notes (Addendum)
STROKE TEAM PROGRESS NOTE   BRIEF HPI Ms. Sara [name] is a 47 y.o. 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 pylori infection, High risk medication use, Hypercholesterolemia, Hyperlipidemia, mixed, Hypertension, Sickle cell trait, Sleep apnea, and Vitamin D deficiency who presents to [facility] in transfer from the [facility] ED where she received TNK (0124) for acute onset of left sided weakness.   NIH on Admission 5  SIGNIFICANT HOSPITAL EVENTS 12/13- TNK admit to ICU 12/14- Transfer out of ICU, need TOC assistance with placement/discharge  INTERIM HISTORY/SUBJECTIVE Stroke symptoms improving. MRI negative for acute infarct. Transfer out of ICU Echo still pending  OBJECTIVE  CBC    Component Value Date/Time   WBC 7.8 01/10/2023 0339   RBC 4.55 01/10/2023 0339   HGB 13.0 01/10/2023 0339   HCT 37.7 01/10/2023 0339   PLT 163 01/10/2023 0339   MCV 82.9 01/10/2023 0339   MCH 28.6 01/10/2023 0339   MCHC 34.5 01/10/2023 0339   RDW 12.1 01/10/2023 0339   LYMPHSABS 3.0 01/09/2023 0817   MONOABS 0.5 01/09/2023 0817   EOSABS 0.1 01/09/2023 0817   BASOSABS 0.1 01/09/2023 0817    BMET    Component Value Date/Time   NA 131 (L) 01/10/2023 0339   K 3.8 01/10/2023 0339   CL 106 01/10/2023 0339   CO2 21 (L) 01/10/2023 0339   GLUCOSE 268 (H) 01/10/2023 0339   BUN 10 01/10/2023 0339   CREATININE 0.63 01/10/2023 0339   CALCIUM 8.7 (L) 01/10/2023 0339   GFRNONAA >60 01/10/2023 0339    IMAGING past 24 hours MR BRAIN WO CONTRAST Result Date: 01/10/2023 CLINICAL DATA:  Follow-up examination for stroke, acute left-sided weakness. EXAM: MRI HEAD WITHOUT CONTRAST TECHNIQUE: Multiplanar, multiecho pulse sequences of the brain and surrounding structures were obtained without intravenous contrast. COMPARISON:  Prior studies from 01/09/2023. FINDINGS: Brain: Cerebral volume within normal  limits. No significant cerebral white matter disease or other focal parenchymal signal abnormality. No evidence for acute or subacute ischemia. No areas of chronic cortical infarction. No acute intracranial hemorrhage. Single small focus of susceptibility artifact noted at the left dorsal pons (series 12, image 18), likely a small chronic microhemorrhage, of doubtful significance in isolation. No mass lesion, midline shift or mass effect. No hydrocephalus or extra-axial fluid collection. Pituitary gland suprasellar region within normal limits. Vascular: Major intracranial vascular flow voids are maintained. Skull and upper cervical spine: Craniocervical junction within normal limits. Bone marrow signal intensity normal. No scalp soft tissue abnormality. Sinuses/Orbits: Globes orbital soft tissues within normal limits. Paranasal sinuses are clear. No mastoid effusion. Other: None. IMPRESSION: Normal brain MRI.  No acute intracranial abnormality. Electronically Signed   By: Rise Mu M.D.   On: 01/10/2023 01:48    Vitals:   01/10/23 0500 01/10/23 0600 01/10/23 0700 01/10/23 0800  BP: (!) 149/90 (!) 156/85 (!) 161/89 (!) 155/94  Pulse: 88 89 93 98  Resp: 17 16 19  (!) 22  Temp:      TempSrc:      SpO2: 98% 98% 97% 100%  Weight:      Height:         PHYSICAL EXAM General:  Alert, well-nourished, well-developed patient in no acute distress Psych:  Mood and affect appropriate for situation CV: Regular rate and rhythm on monitor Respiratory:  Regular, unlabored respirations on room air GI: Abdomen soft and nontender   NEURO:  Mental Status: AA&Ox3, no  aphasia. Follows all commands.   Cranial Nerves:  II: PERRL. Visual fields full.  III, IV, VI: EOMI. Eyelids elevate symmetrically.  V: Sensation is intact to light touch and symmetrical to face.  VII: Face is symmetrical resting and smiling VIII: hearing intact to voice. IX, X: Palate elevates symmetrically. Phonation is normal.   EX:[id number] shrug 5/5. XII: tongue is midline without fasciculations. Motor:  LUE and LLE with give way weakness RUE and RLE with full strength  Tone: is normal and bulk is normal Sensation- intact to LT. Tingling in toes/fingers b/l. Coordination: FTN intact bilaterally, HKS: no ataxia in BLE.No drift.  Gait- deferred    ASSESSMENT/PLAN  Stroke like symptoms s/p TNK Possible functional neurological disorder, need to rule out stroke as pt dose have uncontrolled stroke risk factors Neuro exam showed functional component Code Stroke CT head - No acute intracranial pathology CTA head & neck No acute cranial or cervical pathology noted MRI  No acute infarct  2D Echo Pending  LDL 148 HgbA1c 10.8 VTE prophylaxis - SCDs No antithrombotic prior to admission, now on ASA 81mg   Therapy recommendations:  Pending Disposition:  Pending   Hypertension Home meds:  Metoprolol Stable Long term BP goal normotensive  Hyperlipidemia Home meds none LDL 148 goal < 70 Con't Atorvastatin 40mg   Continue statin at discharge  Diabetes type II Uncontrolled Home meds:  insulin, Farxiga HgbA1c 10.8, goal < 7.0 CBGs SSI Recommend close follow-up with PCP for better DM control  Tobacco Abuse Patient smokes cigarretes      Ready to quit? No Nicotine replacement therapy provided  Other Stroke Risk Factors ETOH use, alcohol level <10, advised to drink no more than 1 drink(s) a day  Other Active Problems Homelessness- medication noncompliance due to difficulty obtaining medcations TOC consult placed  Hospital day # 1  Patient seen and examined by NP/APP with MD. MD to update note as needed.   Elmer Picker, DNP, FNP-BC [facility] Pager: [phone]  ATTENDING ATTESTATION:  Likely MRI neg stroke s/p TNK. Stroke workup pending-echo.  Con't PT/OT therapy. Transfer out of ICU. Start antiplatelet. Likely D/C tomorrow.  Dr. Viviann Spare evaluated pt independently, reviewed  imaging, chart, labs. Discussed and formulated plan with the Resident/APP. Changes were made to the note where appropriate. Please see APP/resident note above for details.      This patient is critically ill due to stroke s/p tPA and at significant risk of neurological worsening, death form heart failure, respiratory failure, recurrent stroke, bleeding from [facility], seizure, sepsis. This patient's care requires constant monitoring of vital signs, hemodynamics, respiratory and cardiac monitoring, review of multiple databases, neurological assessment, discussion with family, other specialists and medical decision making of high complexity. I spent 35 minutes of neurocritical care time in the care of this patient.   Odessie Polzin,MD    To contact Stroke Continuity provider, please refer to [email or web address]. After hours, contact General Neurology

## 2023-01-10 NOTE — Discharge Summary (Incomplete)
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   Moderate cocaine use disorder (HCC) 10/14/2022   Cannabis use disorder 10/14/2022   Major depressive disorder, recurrent severe without psychotic features (HCC) 01/29/2022   Helicobacter pylori gastritis 11/28/2013   Nausea with vomiting 08/05/2013   Abdominal pain 08/05/2013     Allergies as of 01/11/2023       Reactions   Clarithromycin Itching, Rash   Doxycycline Anaphylaxis, Rash   Penicillins Anaphylaxis, Rash   Tetracyclines & Related Anaphylaxis   Metronidazole Itching, Rash   Red Dye #40 (allura Red) Itching   Likely red dye allergy - rx with Tylenol elixir, can tolerate tablets        Medication List     TAKE these medications    acetaminophen 500 MG tablet Commonly known as: TYLENOL Take 1,000 mg by mouth every 8 (eight) hours as needed for moderate pain (pain score 4-6).   ARIPiprazole 10 MG tablet Commonly known as: ABILIFY Take 1 tablet (10 mg total) by mouth daily.   aspirin EC 81 MG tablet Take 1 tablet (81 mg total) by mouth daily. Swallow whole. Start taking on: January 12, 2023   atorvastatin 40 MG tablet Commonly known as: LIPITOR Take 1 tablet (40 mg total) by mouth daily. Start taking on: January 12, 2023   dapagliflozin propanediol 5 MG Tabs tablet Commonly known as: FARXIGA Take 5 mg by mouth daily. Patient stated they are suppose to be sending medication. 01/09/2023.   FLUoxetine 20 MG capsule Commonly known as: PROZAC Take 1 capsule (20 mg total) by mouth  daily.   gabapentin 300 MG capsule Commonly known as: NEURONTIN Take 2 capsules (600 mg total) by mouth 3 (three) times daily.   hydrOXYzine 25 MG tablet Commonly known as: ATARAX Take 1 tablet (25 mg total) by mouth every 6 (six) hours as needed for anxiety.   insulin aspart 100 UNIT/ML injection Commonly known as: novoLOG Inject 0-15 Units into the skin 3 (three) times daily with meals.   insulin aspart 100 UNIT/ML injection Commonly known as: novoLOG Inject 0-5 Units into the skin at bedtime.   insulin glargine-yfgn 100 UNIT/ML injection Commonly known as: SEMGLEE Inject 0.16 mLs (16 Units total) into the skin at bedtime.   linaclotide 145 MCG Caps capsule Commonly known as: LINZESS Take 1 capsule (145 mcg total) by mouth daily before breakfast.   metoprolol succinate 25 MG 24 hr tablet Commonly known as: TOPROL-XL Take 0.5 tablets (12.5 mg total) by mouth daily.   nicotine 21 mg/24hr patch Commonly known as: NICODERM CQ - dosed in mg/24 hours Place 1 patch (21 mg total) onto the skin daily. Start taking on: January 12, 2023   nicotine polacrilex 2 MG gum Commonly known as: NICORETTE Take 1 each (2 mg total) by mouth as needed for smoking cessation.   omeprazole 40 MG capsule Commonly known as: PRILOSEC Take 40 mg by mouth daily.   oxyCODONE-acetaminophen 5-325 MG tablet Commonly known as: PERCOCET/ROXICET Take 1 tablet by mouth every 8 (eight) hours as needed  for moderate pain.        LABORATORY STUDIES CBC    Component Value Date/Time   WBC 7.8 01/10/2023 0339   RBC 4.55 01/10/2023 0339   HGB 13.0 01/10/2023 0339   HCT 37.7 01/10/2023 0339   PLT 163 01/10/2023 0339   MCV 82.9 01/10/2023 0339   MCH 28.6 01/10/2023 0339   MCHC 34.5 01/10/2023 0339   RDW 12.1 01/10/2023 0339   LYMPHSABS 3.0 01/09/2023 0817   MONOABS 0.5 01/09/2023 0817   EOSABS 0.1 01/09/2023 0817   BASOSABS 0.1 01/09/2023 0817   CMP    Component Value Date/Time   NA 131 (L)  01/10/2023 0339   K 3.8 01/10/2023 0339   CL 106 01/10/2023 0339   CO2 21 (L) 01/10/2023 0339   GLUCOSE 268 (H) 01/10/2023 0339   BUN 10 01/10/2023 0339   CREATININE 0.63 01/10/2023 0339   CALCIUM 8.7 (L) 01/10/2023 0339   PROT 6.6 01/09/2023 0817   ALBUMIN 3.2 (L) 01/09/2023 0817   AST 14 (L) 01/09/2023 0817   ALT 13 01/09/2023 0817   ALKPHOS 61 01/09/2023 0817   BILITOT 0.8 01/09/2023 0817   GFRNONAA >60 01/10/2023 0339   GFRAA  12/11/2009 1210    >60        The eGFR has been calculated using the MDRD equation. This calculation has not been validated in all clinical situations. eGFR's persistently <60 mL/min signify possible Chronic Kidney Disease.   COAGSNo results found for: "INR", "PROTIME" Lipid Panel    Component Value Date/Time   CHOL 215 (H) 01/10/2023 0339   TRIG 79 01/10/2023 0339   HDL 51 01/10/2023 0339   CHOLHDL 4.2 01/10/2023 0339   VLDL 16 01/10/2023 0339   LDLCALC 148 (H) 01/10/2023 0339   HgbA1C  Lab Results  Component Value Date   HGBA1C 10.8 (H) 01/09/2023   Urine Drug Screen Positive for THC Alcohol Level    Component Value Date/Time   [facility] <10 06/17/2022 1937     SIGNIFICANT DIAGNOSTIC STUDIES ECHOCARDIOGRAM COMPLETE Result Date: 01/10/2023    ECHOCARDIOGRAM REPORT   Patient Name:   JORENE LAHRMAN Date of Exam: 01/10/2023 Medical Rec #:  [id number]      Height:       64.0 in Accession #:    [id number]     Weight:       149.5 lb Date of Birth:  Jun 06, 1975      BSA:          1.729 m Patient Age:    47 years       BP:           145/88 mmHg Patient Gender: F              HR:           91 bpm. Exam Location:  Inpatient Procedure: 2D Echo, Color Doppler and Cardiac Doppler Indications:    Stroke  History:        Patient has no prior history of Echocardiogram examinations.                 Risk Factors:Diabetes, Dyslipidemia, Hypertension, Sleep Apnea                 and Current Smoker. Sickle Cell.  Sonographer:    Raeford Razor RDCS Referring Phys: [id number]  ERIC LINDZEN IMPRESSIONS  1. Left ventricular ejection fraction, by estimation, is 55 to 60%. The left ventricle has normal function. The left ventricle has no  regional wall motion abnormalities. Left ventricular diastolic parameters are consistent with Grade I diastolic dysfunction (impaired relaxation).  2. Right ventricular systolic function is normal. The right ventricular size is normal.  3. There is no evidence of cardiac tamponade.  4. The mitral valve is normal in structure. Mild mitral valve regurgitation. No evidence of mitral stenosis.  5. The aortic valve is tricuspid. Aortic valve regurgitation is not visualized. No aortic stenosis is present.  6. The inferior vena cava is normal in size with greater than 50% respiratory variability, suggesting right atrial pressure of 3 mmHg. Comparison(s): No significant change from prior study. Prior images reviewed side by side. FINDINGS  Left Ventricle: Left ventricular ejection fraction, by estimation, is 55 to 60%. The left ventricle has normal function. The left ventricle has no regional wall motion abnormalities. The left ventricular internal cavity size was normal in size. There is  no left ventricular hypertrophy. Left ventricular diastolic parameters are consistent with Grade I diastolic dysfunction (impaired relaxation). Right Ventricle: The right ventricular size is normal. No increase in right ventricular wall thickness. Right ventricular systolic function is normal. Left Atrium: Left atrial size was normal in size. Right Atrium: Right atrial size was normal in size. Pericardium: There is no evidence of pericardial effusion. There is no evidence of cardiac tamponade. Mitral Valve: The mitral valve is normal in structure. Mild mitral valve regurgitation. No evidence of mitral valve stenosis. Tricuspid Valve: The tricuspid valve is normal in structure. Tricuspid valve regurgitation is trivial. No evidence of tricuspid stenosis. Aortic Valve: The aortic  valve is tricuspid. Aortic valve regurgitation is not visualized. No aortic stenosis is present. Aortic valve mean gradient measures 3.0 mmHg. Aortic valve peak gradient measures 5.9 mmHg. Aortic valve area, by VTI measures 2.55 cm. Pulmonic Valve: The pulmonic valve was normal in structure. Pulmonic valve regurgitation is not visualized. No evidence of pulmonic stenosis. Aorta: The aortic root is normal in size and structure. Venous: The inferior vena cava is normal in size with greater than 50% respiratory variability, suggesting right atrial pressure of 3 mmHg. IAS/Shunts: No atrial level shunt detected by color flow Doppler.  LEFT VENTRICLE PLAX 2D LVIDd:         4.30 cm     Diastology LVIDs:         2.40 cm     LV e' medial:    6.74 cm/s LV PW:         1.00 cm     LV E/e' medial:  13.3 LV IVS:        1.10 cm     LV e' lateral:   7.07 cm/s LVOT diam:     2.00 cm     LV E/e' lateral: 12.7 LV SV:         56 LV SV Index:   32 LVOT Area:     3.14 cm  LV Volumes (MOD) LV vol d, MOD A2C: 85.1 ml LV vol d, MOD A4C: 83.4 ml LV vol s, MOD A2C: 39.4 ml LV vol s, MOD A4C: 36.3 ml LV SV MOD A2C:     45.7 ml LV SV MOD A4C:     83.4 ml LV SV MOD BP:      47.2 ml RIGHT VENTRICLE             IVC RV Basal diam:  3.00 cm     IVC diam: 1.80 cm RV S prime:     11.30 cm/s TAPSE (M-mode): 1.4 cm LEFT  ATRIUM             Index        RIGHT ATRIUM           Index LA diam:        3.00 cm 1.74 cm/m   RA Area:     10.80 cm LA Vol (A2C):   47.1 ml 27.25 ml/m  RA Volume:   20.20 ml  11.69 ml/m LA Vol (A4C):   49.6 ml 28.69 ml/m LA Biplane Vol: 50.4 ml 29.16 ml/m  AORTIC VALVE AV Area (Vmax):    3.14 cm AV Area (Vmean):   2.89 cm AV Area (VTI):     2.55 cm AV Vmax:           121.00 cm/s AV Vmean:          82.100 cm/s AV VTI:            0.218 m AV Peak Grad:      5.9 mmHg AV Mean Grad:      3.0 mmHg LVOT Vmax:         121.00 cm/s LVOT Vmean:        75.500 cm/s LVOT VTI:          0.177 m LVOT/AV VTI ratio: 0.81  AORTA Ao Asc diam:  2.90 cm MITRAL VALVE MV Area (PHT): 4.49 cm    SHUNTS MV Decel Time: 169 msec    Systemic VTI:  0.18 m MV E velocity: 89.60 cm/s  Systemic Diam: 2.00 cm MV A velocity: 80.20 cm/s MV E/A ratio:  1.12 Donato Schultz MD Electronically signed by Donato Schultz MD Signature Date/Time: 01/10/2023/4:04:02 PM    Final    MR BRAIN WO CONTRAST Result Date: 01/10/2023 CLINICAL DATA:  Follow-up examination for stroke, acute left-sided weakness. EXAM: MRI HEAD WITHOUT CONTRAST TECHNIQUE: Multiplanar, multiecho pulse sequences of the brain and surrounding structures were obtained without intravenous contrast. COMPARISON:  Prior studies from 01/09/2023. FINDINGS: Brain: Cerebral volume within normal limits. No significant cerebral white matter disease or other focal parenchymal signal abnormality. No evidence for acute or subacute ischemia. No areas of chronic cortical infarction. No acute intracranial hemorrhage. Single small focus of susceptibility artifact noted at the left dorsal pons (series 12, image 18), likely a small chronic microhemorrhage, of doubtful significance in isolation. No mass lesion, midline shift or mass effect. No hydrocephalus or extra-axial fluid collection. Pituitary gland suprasellar region within normal limits. Vascular: Major intracranial vascular flow voids are maintained. Skull and upper cervical spine: Craniocervical junction within normal limits. Bone marrow signal intensity normal. No scalp soft tissue abnormality. Sinuses/Orbits: Globes orbital soft tissues within normal limits. Paranasal sinuses are clear. No mastoid effusion. Other: None. IMPRESSION: Normal brain MRI.  No acute intracranial abnormality. Electronically Signed   By: Rise Mu M.D.   On: 01/10/2023 01:48       HISTORY OF PRESENT ILLNESS Ms. APRIL GAMBA is a 47 y.o. 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 pylori infection, High risk medication use, Hypercholesterolemia, Hyperlipidemia, mixed, Hypertension, Sickle cell trait, Sleep apnea, and Vitamin D deficiency who presents to [facility] in transfer from the [facility] ED where she received TNK (0124) for acute onset of left sided weakness.   HOSPITAL COURSE Stroke like symptoms s/p TNK Possible functional neurological disorder, with stroke ruled out Neuro exam showed functional component Code Stroke CT head - No acute intracranial  pathology CTA head & neck No acute cranial or cervical pathology noted MRI  No acute infarct  2D Echo EF 55-60% LDL 148 HgbA1c 10.8 VTE prophylaxis - SCDs No antithrombotic prior to admission, now on ASA 81mg   Therapy recommendations:  SNF Disposition:  Return to womens shelter in [place]   Hypertension Home meds:  Metoprolol Stable Long term BP goal normotensive   Hyperlipidemia Home meds none LDL 148 goal < 70 Con't Atorvastatin 40mg   Continue statin at discharge   Diabetes type II Uncontrolled Home meds:  insulin, Farxiga HgbA1c 10.8, goal < 7.0 CBGs SSI Recommend close follow-up with PCP for better DM control   Tobacco Abuse Patient smokes cigarretes      Ready to quit? No Nicotine replacement therapy provided   Other Stroke Risk Factors ETOH use, alcohol level <10, advised to drink no more than 1 drink(s) a day     DISCHARGE EXAM General:  Alert, well-nourished, well-developed patient in no acute distress Psych:  Mood and affect appropriate for situation CV: Regular rate and rhythm on monitor Respiratory:  Regular, unlabored respirations on room air GI: Abdomen soft and nontender     NEURO:  Mental Status: AA&Ox3, no aphasia. Follows all commands.    Cranial Nerves:  II: PERRL. Visual fields full.  III, IV, VI: EOMI. Eyelids elevate symmetrically.  V: Sensation is intact to light touch and symmetrical to face.  VII: Face is symmetrical resting and smiling VIII: hearing intact to  voice. IX, X: Palate elevates symmetrically. Phonation is normal.  WU:[id number] shrug 5/5. XII: tongue is midline without fasciculations. Motor:  LUE and LLE with give way weakness RUE and RLE with full strength  Tone: is normal and bulk is normal Sensation- intact to LT. Tingling in toes/fingers b/l. Coordination: FTN intact bilaterally, HKS: no ataxia in BLE.No drift.  Gait- deferred    1a Level of Conscious.: 0 1b LOC Questions: 0 1c LOC Commands: 0 2 Best Gaze: 0 3 Visual: 0 4 Facial Palsy: 0 5a Motor Arm - left: 0 5b Motor Arm - Right: 0 6a Motor Leg - Left: 0 6b Motor Leg - Right: 0 7 Limb Ataxia: 0 8 Sensory: 0 9 Best Language: 0 10 Dysarthria: 0 11 Extinct. and Inatten.: 0 TOTAL: 0   Discharge Diet       Diet   Diet heart healthy/carb modified Fluid consistency: Thin   liquids  DISCHARGE PLAN Disposition: Home - [facility] Shel[facility] in [place] aspirin 81 mg daily for secondary stroke prevention  Ongoing stroke risk factor control by Primary Care Physician at time of discharge Follow-up PCP Center, [facility] in 2 weeks. Follow-up in [facility] in 8 weeks, office to schedule an appointment.   30 minutes were spent preparing discharge.  Patient seen and examined by NP/APP with MD. MD to update note as needed.   Elmer Picker, DNP, FNP-BC [facility] Pager: [phone]  ATTENDING ATTESTATION:  Exam back to baseline, no deficits. She is wanting to be discharged.   Dr. Viviann Spare evaluated pt independently, reviewed imaging, chart, labs. Discussed and formulated plan with the Resident/APP. Changes were made to the note where appropriate. Please see APP/resident note above for details.   Total 36 minutes spent on counseling patient and coordinating care, writing notes and reviewing chart.   Gaurang Palikh,MD

## 2023-01-10 NOTE — TOC Initial Note (Signed)
Transition of Care [facility]) - Initial/Assessment Note    Patient Details  Name: Sara [name] MRN: [id number] Date of Birth: 12-18-1975  Transition of Care [facility]) CM/SW Contact:    Mearl Latin, LCSW Phone Number: 01/10/2023, 10:42 AM  Clinical Narrative:                 Patient admitted from [facility] in [place] (she has the key card on her wrist). CSW received SNF recommendation due to weakness. Placement barriers include age, Medicaid Access ([id number]), and substance use. Will continue to follow.     Barriers to Discharge: English as a second language teacher, Continued Medical Work up, SNF Pending bed offer   Patient Goals and CMS Choice            Expected Discharge Plan and Services In-house Referral: Clinical Social Work   Post Acute Care Choice: Skilled Nursing Facility Living arrangements for the past 2 months: Homeless Shelter, Homeless                                      Prior Living Arrangements/Services Living arrangements for the past 2 months: Homeless Shelter, Homeless Lives with:: Self Patient language and need for interpreter reviewed:: Yes Do you feel safe going back to the place where you live?: Yes      Need for Family Participation in Patient Care: No (Comment) Care giver support system in place?: No (comment)   Criminal Activity/Legal Involvement Pertinent to Current Situation/Hospitalization: No - Comment as needed  Activities of Daily Living   ADL Screening (condition at time of admission) Independently performs ADLs?: No Does the patient have a NEW difficulty with bathing/dressing/toileting/self-feeding that is expected to last >3 days?: Yes (Initiates electronic notice to provider for possible OT consult) Does the patient have a NEW difficulty with getting in/out of bed, walking, or climbing stairs that is expected to last >3 days?: Yes (Initiates electronic notice to provider for possible PT consult) Does the patient have  a NEW difficulty with communication that is expected to last >3 days?: No Is the patient deaf or have difficulty hearing?: No Does the patient have difficulty seeing, even when wearing glasses/contacts?: No Does the patient have difficulty concentrating, remembering, or making decisions?: No  Permission Sought/Granted                  Emotional Assessment Appearance:: Appears stated age     Orientation: : Oriented to Self, Oriented to Place, Oriented to  Time, Oriented to Situation Alcohol / Substance Use: Illicit Drugs Psych Involvement: No (comment)  Admission diagnosis:  Stroke (cerebrum) (HCC) [I63.9] Patient Active Problem List   Diagnosis Date Noted   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   Moderate cocaine use disorder (HCC) 10/14/2022   Cannabis use disorder 10/14/2022   Major depressive disorder, recurrent severe without psychotic features (HCC) 01/29/2022   Helicobacter pylori gastritis 11/28/2013   Nausea with vomiting 08/05/2013   Abdominal pain 08/05/2013   PCP:  [facility]:   [facility] [place], [place] - [address] [place] [address] Phone: [phone] Fax: [phone]     Social Drivers of Health (SDOH) Social History: SDOH Screenings   Food Insecurity: Food Insecurity Present (01/09/2023)  Housing: High Risk (01/09/2023)  Transportation Needs: Unmet Transportation Needs (01/09/2023)  Utilities: Not At Risk (01/09/2023)  Alcohol Screen: Low Risk  (10/14/2022)  Financial  Resource Strain: High Risk (12/19/2022)   Received from [facility]  Physical Activity: Sufficiently Active (01/21/2022)   Received from [facility]  Social Connections: Socially Isolated (01/21/2022)   Received from [facility], [facility]  Stress: Stress Concern Present (01/21/2022)   Received from [facility], [facility]  Tobacco Use: High Risk (12/19/2022)   Received from [facility]   SDOH Interventions:     Readmission Risk Interventions     No data to display

## 2023-01-11 DIAGNOSIS — G459 Transient cerebral ischemic attack, unspecified: Secondary | ICD-10-CM | POA: Diagnosis not present

## 2023-01-11 LAB — GLUCOSE, CAPILLARY
Glucose-Capillary: 317 mg/dL — ABNORMAL HIGH (ref 70–99)
Glucose-Capillary: 101 mg/dL — ABNORMAL HIGH (ref 70–99)

## 2023-01-11 MED ORDER — NICOTINE 21 MG/24HR TD PT24: 21.0000 mg | MEDICATED_PATCH | Freq: Every day | TRANSDERMAL | 0 refills | Status: DC

## 2023-01-11 MED ORDER — ATORVASTATIN CALCIUM 40 MG PO TABS: 40.0000 mg | ORAL_TABLET | Freq: Every day | ORAL | 1 refills | Status: DC

## 2023-01-11 MED ORDER — ASPIRIN 81 MG PO TBEC: 81.0000 mg | DELAYED_RELEASE_TABLET | Freq: Every day | ORAL | 12 refills | Status: DC

## 2023-01-11 NOTE — Progress Notes (Addendum)
Patient verbalized understanding of dc instructions.All belongings and dc papers given to patient. This patient going home via cab.voucher provided by Child psychotherapist.

## 2023-01-11 NOTE — Plan of Care (Signed)

## 2023-01-11 NOTE — Plan of Care (Signed)
Problem: Education: Goal: Ability to describe self-care measures that may prevent or decrease complications (Diabetes Survival Skills Education) will improve 01/11/2023 1402 by Beryle Flock, RN Outcome: Adequate for Discharge 01/11/2023 1049 by Beryle Flock, RN Outcome: Progressing Goal: Individualized Educational Video(s) 01/11/2023 1402 by Beryle Flock, RN Outcome: Adequate for Discharge 01/11/2023 1049 by Beryle Flock, RN Outcome: Progressing   Problem: Coping: Goal: Ability to adjust to condition or change in health will improve 01/11/2023 1402 by Beryle Flock, RN Outcome: Adequate for Discharge 01/11/2023 1049 by Beryle Flock, RN Outcome: Progressing   Problem: Fluid Volume: Goal: Ability to maintain a balanced intake and output will improve 01/11/2023 1402 by Beryle Flock, RN Outcome: Adequate for Discharge 01/11/2023 1049 by Beryle Flock, RN Outcome: Progressing   Problem: Health Behavior/Discharge Planning: Goal: Ability to identify and utilize available resources and services will improve 01/11/2023 1402 by Beryle Flock, RN Outcome: Adequate for Discharge 01/11/2023 1049 by Beryle Flock, RN Outcome: Progressing Goal: Ability to manage health-related needs will improve 01/11/2023 1402 by Beryle Flock, RN Outcome: Adequate for Discharge 01/11/2023 1049 by Beryle Flock, RN Outcome: Progressing   Problem: Metabolic: Goal: Ability to maintain appropriate glucose levels will improve 01/11/2023 1402 by Beryle Flock, RN Outcome: Adequate for Discharge 01/11/2023 1049 by Beryle Flock, RN Outcome: Progressing   Problem: Nutritional: Goal: Maintenance of adequate nutrition will improve 01/11/2023 1402 by Beryle Flock, RN Outcome: Adequate for Discharge 01/11/2023 1049 by Beryle Flock, RN Outcome: Progressing Goal: Progress toward achieving an optimal weight will improve 01/11/2023 1402 by Beryle Flock, RN Outcome: Adequate for  Discharge 01/11/2023 1049 by Beryle Flock, RN Outcome: Progressing   Problem: Skin Integrity: Goal: Risk for impaired skin integrity will decrease 01/11/2023 1402 by Beryle Flock, RN Outcome: Adequate for Discharge 01/11/2023 1049 by Beryle Flock, RN Outcome: Progressing   Problem: Tissue Perfusion: Goal: Adequacy of tissue perfusion will improve 01/11/2023 1402 by Beryle Flock, RN Outcome: Adequate for Discharge 01/11/2023 1049 by Beryle Flock, RN Outcome: Progressing   Problem: Education: Goal: Knowledge of disease or condition will improve 01/11/2023 1402 by Beryle Flock, RN Outcome: Adequate for Discharge 01/11/2023 1049 by Beryle Flock, RN Outcome: Progressing Goal: Knowledge of secondary prevention will improve (MUST DOCUMENT ALL) 01/11/2023 1402 by Beryle Flock, RN Outcome: Adequate for Discharge 01/11/2023 1049 by Beryle Flock, RN Outcome: Progressing Goal: Knowledge of patient specific risk factors will improve Loraine Leriche N/A or DELETE if not current risk factor) 01/11/2023 1402 by Beryle Flock, RN Outcome: Adequate for Discharge 01/11/2023 1049 by Beryle Flock, RN Outcome: Progressing   Problem: Ischemic Stroke/TIA Tissue Perfusion: Goal: Complications of ischemic stroke/TIA will be minimized 01/11/2023 1402 by Beryle Flock, RN Outcome: Adequate for Discharge 01/11/2023 1049 by Beryle Flock, RN Outcome: Progressing   Problem: Coping: Goal: Will verbalize positive feelings about self 01/11/2023 1402 by Beryle Flock, RN Outcome: Adequate for Discharge 01/11/2023 1049 by Beryle Flock, RN Outcome: Progressing Goal: Will identify appropriate support needs 01/11/2023 1402 by Beryle Flock, RN Outcome: Adequate for Discharge 01/11/2023 1049 by Beryle Flock, RN Outcome: Progressing   Problem: Health Behavior/Discharge Planning: Goal: Ability to manage health-related needs will improve 01/11/2023 1402 by Beryle Flock, RN Outcome: Adequate for  Discharge 01/11/2023 1049 by Beryle Flock, RN Outcome: Progressing Goal: Goals will be collaboratively established with patient/family 01/11/2023 1402 by Beryle Flock, RN Outcome: Adequate for Discharge 01/11/2023 1049 by Beryle Flock, RN Outcome: Progressing   Problem: Self-Care: Goal: Ability to participate in self-care as condition permits will improve 01/11/2023 1402 by Beryle Flock, RN Outcome:  Adequate for Discharge 01/11/2023 1049 by Beryle Flock, RN Outcome: Progressing Goal: Verbalization of feelings and concerns over difficulty with self-care will improve 01/11/2023 1402 by Beryle Flock, RN Outcome: Adequate for Discharge 01/11/2023 1049 by Beryle Flock, RN Outcome: Progressing Goal: Ability to communicate needs accurately will improve 01/11/2023 1402 by Beryle Flock, RN Outcome: Adequate for Discharge 01/11/2023 1049 by Beryle Flock, RN Outcome: Progressing   Problem: Nutrition: Goal: Risk of aspiration will decrease 01/11/2023 1402 by Beryle Flock, RN Outcome: Adequate for Discharge 01/11/2023 1049 by Beryle Flock, RN Outcome: Progressing Goal: Dietary intake will improve 01/11/2023 1402 by Beryle Flock, RN Outcome: Adequate for Discharge 01/11/2023 1049 by Beryle Flock, RN Outcome: Progressing   Problem: Education: Goal: Knowledge of General Education information will improve Description: Including pain rating scale, medication(s)/side effects and non-pharmacologic comfort measures 01/11/2023 1402 by Beryle Flock, RN Outcome: Adequate for Discharge 01/11/2023 1049 by Beryle Flock, RN Outcome: Progressing   Problem: Health Behavior/Discharge Planning: Goal: Ability to manage health-related needs will improve 01/11/2023 1402 by Beryle Flock, RN Outcome: Adequate for Discharge 01/11/2023 1049 by Beryle Flock, RN Outcome: Progressing   Problem: Clinical Measurements: Goal: Ability to maintain clinical measurements within normal limits  will improve 01/11/2023 1402 by Beryle Flock, RN Outcome: Adequate for Discharge 01/11/2023 1049 by Beryle Flock, RN Outcome: Progressing Goal: Will remain free from infection 01/11/2023 1402 by Beryle Flock, RN Outcome: Adequate for Discharge 01/11/2023 1049 by Beryle Flock, RN Outcome: Progressing Goal: Diagnostic test results will improve 01/11/2023 1402 by Beryle Flock, RN Outcome: Adequate for Discharge 01/11/2023 1049 by Beryle Flock, RN Outcome: Progressing Goal: Respiratory complications will improve 01/11/2023 1402 by Beryle Flock, RN Outcome: Adequate for Discharge 01/11/2023 1049 by Beryle Flock, RN Outcome: Progressing Goal: Cardiovascular complication will be avoided 01/11/2023 1402 by Beryle Flock, RN Outcome: Adequate for Discharge 01/11/2023 1049 by Beryle Flock, RN Outcome: Progressing   Problem: Activity: Goal: Risk for activity intolerance will decrease 01/11/2023 1402 by Beryle Flock, RN Outcome: Adequate for Discharge 01/11/2023 1049 by Beryle Flock, RN Outcome: Progressing   Problem: Nutrition: Goal: Adequate nutrition will be maintained 01/11/2023 1402 by Beryle Flock, RN Outcome: Adequate for Discharge 01/11/2023 1049 by Beryle Flock, RN Outcome: Progressing   Problem: Coping: Goal: Level of anxiety will decrease 01/11/2023 1402 by Beryle Flock, RN Outcome: Adequate for Discharge 01/11/2023 1049 by Beryle Flock, RN Outcome: Progressing   Problem: Elimination: Goal: Will not experience complications related to bowel motility 01/11/2023 1402 by Beryle Flock, RN Outcome: Adequate for Discharge 01/11/2023 1049 by Beryle Flock, RN Outcome: Progressing Goal: Will not experience complications related to urinary retention 01/11/2023 1402 by Beryle Flock, RN Outcome: Adequate for Discharge 01/11/2023 1049 by Beryle Flock, RN Outcome: Progressing   Problem: Pain Management: Goal: General experience of comfort will  improve 01/11/2023 1402 by Beryle Flock, RN Outcome: Adequate for Discharge 01/11/2023 1049 by Beryle Flock, RN Outcome: Progressing   Problem: Safety: Goal: Ability to remain free from injury will improve 01/11/2023 1402 by Beryle Flock, RN Outcome: Adequate for Discharge 01/11/2023 1049 by Beryle Flock, RN Outcome: Progressing   Problem: Skin Integrity: Goal: Risk for impaired skin integrity will decrease 01/11/2023 1402 by Beryle Flock, RN Outcome: Adequate for Discharge 01/11/2023 1049 by Beryle Flock, RN Outcome: Progressing

## 2023-01-14 ENCOUNTER — Telehealth: Payer: Self-pay

## 2023-01-14 NOTE — Patient Outreach (Addendum)
  Emmi Stroke Care Coordination Follow Up  01/14/2023 Name:  Sara [name] MRN:  [id number] DOB:  04/21/1975  Subjective: Sara [name] is a 47 y.o. year old female who is a primary care patient of [facility] An Emmi alert was received indicating patient responded to questions: Filled new prescriptions?. I reached out by phone to follow up on the alert and spoke to Patient. Patient voices she is doing okay-currently outside taking a walk. She feels like he is getting stronger and better. Dens any pain or acute sxs. Pt voices she is staying at shelter in [name]-plans to be there 'for a while and voices she like it there. She is getting good care and they assist her by providing meals and transportation. Patient reports she picked up her new meds except nicotine patches as she continues to smoke and not interested in quitting at present. Smoking cessation education provided. She states he is still waiting on insurance auth/approval for [place]. She called her Medicaid CM this morning and left message. Provided pt with [employer] customer service info to call and f/u on status of auth for med.She voices that her cbg meter is in [name] with her sister so she has not been able to monitor cbgs. Discussed importance of cbg monitoring. Pt asked shelter staff during call if someone could take her to go get meter and was told yes-so she plans to go get meter today.She has follow up appts in place with providers-[facility] for pain mgmt, [facility] for PCP. She voices she will be seeing a local doctor while she is staying in [place] and has appt on 01/23/23. Confirmed that shelter staff will transport pt to appts. Pt declined needing any resources/assistance at this time. Appetite has been good-trying to adhere to diet restrictions. No BM in about 3-4 days-pt voices this is normal for her-she will get something OTC today to take. Denies any RN CM needs or concerns at  this time.  Care Coordination Interventions:  Yes, provided   Interventions Today    Flowsheet Row Most Recent Value  Chronic Disease   Chronic disease during today's visit Other, Diabetes  [post stroke mgmt]  General Interventions   General Interventions Discussed/Reviewed General Interventions Discussed, Durable Medical Equipment (DME), Doctor Visits  Doctor Visits Discussed/Reviewed Doctor Visits Discussed, PCP, Specialist  Durable Medical Equipment (DME) Glucomoter  PCP/Specialist Visits Compliance with follow-up visit  Education Interventions   Education Provided Provided Education  Provided Verbal Education On Nutrition, When to see the doctor, Blood Sugar Monitoring, Medication, Insurance Plans, Other  [smoking cessation]  Nutrition Interventions   Nutrition Discussed/Reviewed Nutrition Discussed, Carbohydrate meal planning, Decreasing sugar intake, Decreasing salt, Decreasing fats, Increasing proteins, Adding fruits and vegetables, Fluid intake  Pharmacy Interventions   Pharmacy Dicussed/Reviewed Pharmacy Topics Discussed, Medications and their functions  Safety Interventions   Safety Discussed/Reviewed Safety Discussed        Follow up plan: Advised patient that they would continue to get automated EMMI-Stroke post discharge calls to assess how they are doing following recent hospitalization and will receive a call from a nurse if any of their responses were abnormal. Patient voiced understanding and was appreciative of f/u call.   Encounter Outcome:  Patient Visit Completed    Antionette Fairy, RN,BSN,CCM RN Care Manager Transitions of Care  [facility]  Direct Phone: [phone] Toll Free: [phone] Fax: [phone]

## 2023-01-14 NOTE — Progress Notes (Signed)
Received a red flag Emmi stroke notification. I have assigned Roshanda Florance, RN to call for follow up and determine if there are any Case Management needs.    Laura Greeson, CBCS, CMAA [facility] Care Management Assistant [facility] Care Management [phone]  

## 2023-02-19 ENCOUNTER — Encounter: Payer: Self-pay | Admitting: Adult Health

## 2023-02-19 ENCOUNTER — Inpatient Hospital Stay: Payer: MEDICAID | Admitting: Adult Health

## 2023-03-18 ENCOUNTER — Inpatient Hospital Stay: Payer: MEDICAID | Admitting: Adult Health
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
## 2023-04-08 ENCOUNTER — Emergency Department (HOSPITAL_COMMUNITY)
Admission: EM | Admit: 2023-04-08 | Discharge: 2023-04-08 | Discharge status: Against Medical Advice | Payer: MEDICAID | Attending: Emergency Medicine | Admitting: Emergency Medicine

## 2023-04-08 ENCOUNTER — Encounter (HOSPITAL_COMMUNITY): Payer: Self-pay

## 2023-04-08 ENCOUNTER — Other Ambulatory Visit: Payer: Self-pay

## 2023-04-08 ENCOUNTER — Ambulatory Visit (INDEPENDENT_AMBULATORY_CARE_PROVIDER_SITE_OTHER): Payer: MEDICAID | Admitting: Adult Health

## 2023-04-08 ENCOUNTER — Encounter: Payer: Self-pay | Admitting: Adult Health

## 2023-04-08 VITALS — BP 202/100 | HR 107 | Ht 64.5 in | Wt 162.4 lb

## 2023-04-08 DIAGNOSIS — J45909 Unspecified asthma, uncomplicated: Secondary | ICD-10-CM | POA: Diagnosis not present

## 2023-04-08 DIAGNOSIS — I169 Hypertensive crisis, unspecified: Secondary | ICD-10-CM

## 2023-04-08 DIAGNOSIS — Z87891 Personal history of nicotine dependence: Secondary | ICD-10-CM | POA: Diagnosis not present

## 2023-04-08 DIAGNOSIS — E785 Hyperlipidemia, unspecified: Secondary | ICD-10-CM

## 2023-04-08 DIAGNOSIS — Z79899 Other long term (current) drug therapy: Secondary | ICD-10-CM | POA: Diagnosis not present

## 2023-04-08 DIAGNOSIS — Z5329 Procedure and treatment not carried out because of patient's decision for other reasons: Secondary | ICD-10-CM | POA: Insufficient documentation

## 2023-04-08 DIAGNOSIS — I10 Essential (primary) hypertension: Secondary | ICD-10-CM | POA: Insufficient documentation

## 2023-04-08 DIAGNOSIS — Z5901 Sheltered homelessness: Secondary | ICD-10-CM | POA: Insufficient documentation

## 2023-04-08 DIAGNOSIS — R1084 Generalized abdominal pain: Secondary | ICD-10-CM | POA: Diagnosis not present

## 2023-04-08 DIAGNOSIS — R110 Nausea: Secondary | ICD-10-CM

## 2023-04-08 DIAGNOSIS — E1169 Type 2 diabetes mellitus with other specified complication: Secondary | ICD-10-CM | POA: Diagnosis not present

## 2023-04-08 DIAGNOSIS — R0602 Shortness of breath: Secondary | ICD-10-CM

## 2023-04-08 DIAGNOSIS — Z794 Long term (current) use of insulin: Secondary | ICD-10-CM | POA: Insufficient documentation

## 2023-04-08 DIAGNOSIS — R197 Diarrhea, unspecified: Secondary | ICD-10-CM | POA: Diagnosis not present

## 2023-04-08 DIAGNOSIS — E119 Type 2 diabetes mellitus without complications: Secondary | ICD-10-CM | POA: Insufficient documentation

## 2023-04-08 DIAGNOSIS — Z7982 Long term (current) use of aspirin: Secondary | ICD-10-CM | POA: Insufficient documentation

## 2023-04-08 DIAGNOSIS — I639 Cerebral infarction, unspecified: Secondary | ICD-10-CM

## 2023-04-08 LAB — URINALYSIS, ROUTINE W REFLEX MICROSCOPIC
Bilirubin Urine: NEGATIVE
Glucose, UA: >=500 mg/dL — AB
Ketones, ur: NEGATIVE mg/dL
Leukocytes,Ua: NEGATIVE
Nitrite: NEGATIVE
Protein, ur: >=300 mg/dL — AB
Specific Gravity, Urine: 1.013 (ref 1.005–1.030)
pH: 5 (ref 5.0–8.0)

## 2023-04-08 LAB — LIPASE, BLOOD: Lipase: 26 U/L (ref 11–51)

## 2023-04-08 LAB — COMPREHENSIVE METABOLIC PANEL
ALT: 13 U/L (ref 0–44)
AST: 13 U/L — ABNORMAL LOW (ref 15–41)
Albumin: 2.6 g/dL — ABNORMAL LOW (ref 3.5–5.0)
Alkaline Phosphatase: 58 U/L (ref 38–126)
Anion gap: 6 (ref 5–15)
BUN: 11 mg/dL (ref 6–20)
CO2: 25 mmol/L (ref 22–32)
Calcium: 8.7 mg/dL — ABNORMAL LOW (ref 8.9–10.3)
Chloride: 106 mmol/L (ref 98–111)
Creatinine, Ser: 0.83 mg/dL (ref 0.44–1.00)
GFR, Estimated: >60 mL/min (ref >60)
Glucose, Bld: 336 mg/dL — ABNORMAL HIGH (ref 70–99)
Potassium: 3.8 mmol/L (ref 3.5–5.1)
Sodium: 137 mmol/L (ref 135–145)
Total Bilirubin: 0.4 mg/dL (ref 0.0–1.2)
Total Protein: 5.9 g/dL — ABNORMAL LOW (ref 6.5–8.1)

## 2023-04-08 LAB — CBC
HCT: 37.4 % (ref 36.0–46.0)
Hemoglobin: 12.6 g/dL (ref 12.0–15.0)
MCH: 29 pg (ref 26.0–34.0)
MCHC: 33.7 g/dL (ref 30.0–36.0)
MCV: 86.2 fL (ref 80.0–100.0)
Platelets: 154 10*3/uL (ref 150–400)
RBC: 4.34 MIL/uL (ref 3.87–5.11)
RDW: 12.4 % (ref 11.5–15.5)
WBC: 9.3 10*3/uL (ref 4.0–10.5)
nRBC: 0 % (ref 0.0–0.2)

## 2023-04-08 MED ORDER — ONDANSETRON 4 MG PO TBDP: 4.0000 mg | ORAL_TABLET | Freq: Three times a day (TID) | ORAL | 0 refills | Status: DC | PRN

## 2023-04-08 NOTE — ED Provider Notes (Signed)
  EMERGENCY DEPARTMENT AT [facility] Provider Note   CSN: [id number] Arrival date & time: 04/08/23  1012     History {Add pertinent medical, surgical, social history, OB history to HPI:1} Chief Complaint  Patient presents with   Nausea   Hypertension   Diarrhea    Sara [name] is a 48 y.o. 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  She states that she typically has sharp chest pain in the morning that lasts only a couple minutes and resolves on its own.  She states that this does not worsen with exertion and worsens with coughing.  She has been using her inhaler 4 times a week over the past 2 weeks when she typically only uses it sparingly once every 2 weeks or so.  She also complains of diffuse abdominal pain, diarrhea over past 2 weeks.  She [facility] in a [facility] shelter in [place] and notes that some residents have had the flu.  However, she states that she was tested negative for COVID, flu, RSV at [facility] 4 days ago.  She denies blood in stool, difficulty with oral hydration, vomiting, fevers at home.  She also endorses increased urinary frequency with slow stream.  She was treated for UTI 3 weeks ago and reports full resolution of symptoms but these persisted at that time as well.  Denies urinary retention, burning with urination, flank pain, hematuria.  She was seen by her neurologist today for strokelike symptoms follow-up status post TNK.  They recommended emergency department evaluation for high blood pressure of 202 systolic in addition to chest pain and shortness of breath symptoms.  She denies visual disturbances, altered mentation, strokelike  symptoms.  She endorses that she has not taken her blood pressure medicine nor insulin in 1 week secondary to not feeling good.  Hypertension Associated symptoms include chest pain, abdominal pain and shortness of breath. Pertinent negatives include no headaches.  Diarrhea Associated symptoms: abdominal pain   Associated symptoms: no chills, no fever, no headaches and no vomiting       Home Medications Prior to Admission medications   Medication Sig Start Date End Date Taking? Authorizing Provider  acetaminophen (TYLENOL) 500 MG tablet Take 1,000 mg by mouth every 8 (eight) hours as needed for moderate pain (pain score 4-6).    [provider]  ARIPiprazole (ABILIFY) 10 MG tablet Take 1 tablet (10 mg total) by mouth daily. Patient not taking: Reported on 01/09/2023 10/22/22   Cecilie Lowers, FNP  aspirin EC 81 MG tablet Take 1 tablet (81 mg total) by mouth daily. Swallow whole. 01/12/23   Elmer Picker, NP  atorvastatin (LIPITOR) 40 MG tablet Take 1 tablet (40 mg total) by mouth daily. 01/12/23   Elmer Picker, NP  dapagliflozin propanediol (FARXIGA) 5 MG TABS tablet Take 5 mg by mouth daily. Patient stated they are suppose to be sending medication. 01/09/2023. Patient not taking: Reported on 04/08/2023 08/07/22   [provider]  FLUoxetine (PROZAC) 20 MG capsule Take 1 capsule (20 mg total) by mouth daily. Patient not taking: Reported on 01/09/2023 10/21/22 11/20/22  Cecilie Lowers, FNP  gabapentin (NEURONTIN) 300 MG capsule Take 2 capsules (600 mg total) by mouth 3 (three) times daily. 10/21/22 04/08/23  Ntuen,  Jesusita Oka, FNP  hydrOXYzine (ATARAX) 25 MG tablet Take 1 tablet (25 mg total) by mouth every 6 (six) hours as needed for anxiety. Patient not taking: Reported on 01/09/2023 10/21/22   Cecilie Lowers, FNP  insulin aspart (NOVOLOG) 100 UNIT/ML injection Inject 0-15 Units into the skin 3 (three) times daily with meals. 10/21/22   Ntuen, Jesusita Oka, FNP  insulin aspart (NOVOLOG) 100  UNIT/ML injection Inject 0-5 Units into the skin at bedtime. 10/21/22   Ntuen, Jesusita Oka, FNP  insulin glargine-yfgn (SEMGLEE) 100 UNIT/ML injection Inject 0.16 mLs (16 Units total) into the skin at bedtime. Patient not taking: Reported on 01/09/2023 10/21/22   Cecilie Lowers, FNP  linaclotide [facility]) 145 MCG CAPS capsule Take 1 capsule (145 mcg total) by mouth daily before breakfast. Patient not taking: Reported on 01/09/2023 10/22/22   Cecilie Lowers, FNP  metoprolol succinate (TOPROL-XL) 25 MG 24 hr tablet Take 0.5 tablets (12.5 mg total) by mouth daily. 10/22/22   Ntuen, Jesusita Oka, FNP  nicotine (NICODERM CQ - DOSED IN MG/24 HOURS) 21 mg/24hr patch Place 1 patch (21 mg total) onto the skin daily. Patient not taking: Reported on 04/08/2023 01/12/23   Elmer Picker, NP  nicotine polacrilex (NICORETTE) 2 MG gum Take 1 each (2 mg total) by mouth as needed for smoking cessation. Patient not taking: Reported on 01/09/2023 10/21/22   Cecilie Lowers, FNP  omeprazole (PRILOSEC) 40 MG capsule Take 40 mg by mouth daily.    [provider]  oxyCODONE-acetaminophen (PERCOCET/ROXICET) 5-325 MG tablet Take 1 tablet by mouth every 8 (eight) hours as needed for moderate pain. 10/21/22   Cecilie Lowers, FNP      Allergies    Clarithromycin, Doxycycline, Penicillins, Tetracyclines & related, Metronidazole, and Red dye #40 (allura red)    Review of Systems   Review of Systems  Constitutional:  Negative for chills, fatigue and fever.  Respiratory:  Positive for shortness of breath. Negative for cough, chest tightness and wheezing.   Cardiovascular:  Positive for chest pain. Negative for palpitations.  Gastrointestinal:  Positive for abdominal pain and diarrhea. Negative for constipation, nausea and vomiting.  Neurological:  Negative for dizziness, seizures, weakness, light-headedness, numbness and headaches.    Physical Exam Updated Vital Signs BP (!) 175/94   Pulse (!) 102   Temp 98.6 F (37 C)   Resp 18    Ht 5' 4.5" (1.638 m)   Wt 74.8 kg   SpO2 100%   BMI 27.88 kg/m  Physical Exam Vitals and nursing note reviewed.  Constitutional:      General: She is not in acute distress.    Appearance: Normal appearance.  HENT:     Head: Normocephalic and atraumatic.  Eyes:     Conjunctiva/sclera: Conjunctivae normal.  Cardiovascular:     Rate and Rhythm: Normal rate.  Pulmonary:     Effort: Pulmonary effort is normal. No respiratory distress.     Breath sounds: Normal breath sounds.  Abdominal:     General: Bowel sounds are normal.     Palpations: Abdomen is soft.     Tenderness: There is generalized abdominal tenderness (with light palpation). There is no right CVA tenderness or left CVA tenderness.  Musculoskeletal:     Right lower leg: No edema.     Left lower leg: No edema.  Skin:    Coloration: Skin is not jaundiced or pale.  Neurological:     Mental Status: She is alert and oriented to person,  place, and time. Mental status is at baseline.     GCS: GCS eye subscore is 4. GCS verbal subscore is 5. GCS motor subscore is 6.     Deep Tendon Reflexes:     Reflex Scores:      Bicep reflexes are 2+ on the right side and 2+ on the left side.      Patellar reflexes are 2+ on the right side and 2+ on the left side.    ED Results / Procedures / Treatments   Labs (all labs ordered are listed, but only abnormal results are displayed) Labs Reviewed  COMPREHENSIVE METABOLIC PANEL - Abnormal; Notable for the following components:      Result Value   Glucose, Bld 336 (*)    Calcium 8.7 (*)    Total Protein 5.9 (*)    Albumin 2.6 (*)    AST 13 (*)    All other components within normal limits  URINALYSIS, ROUTINE W REFLEX MICROSCOPIC - Abnormal; Notable for the following components:   Glucose, UA >=500 (*)    Hgb urine dipstick MODERATE (*)    Protein, ur >=300 (*)    Bacteria, UA RARE (*)    All other components within normal limits  LIPASE, BLOOD  CBC    EKG EKG  Interpretation Date/Time:  Wednesday April 08 2023 10:45:59 EDT Ventricular Rate:  102 PR Interval:  118 QRS Duration:  80 QT Interval:  328 QTC Calculation: 427 R Axis:   80  Text Interpretation: Sinus tachycardia Right atrial enlargement Borderline ECG No significant change since last tracing Confirmed by Melene Plan [phone]) on 04/08/2023 2:55:25 PM  Radiology No results found.  Procedures Procedures  {Document cardiac monitor, telemetry assessment procedure when appropriate:1}  Medications Ordered in ED Medications - No data to display  ED Course/ Medical Decision Making/ A&P   {   Click here for ABCD2, HEART and other calculatorsREFRESH Note before signing :1}                              Medical Decision Making Amount and/or Complexity of Data Reviewed Labs: ordered.  Risk Prescription drug management.     Patient presents to the ED for concern of ***, this involves an extensive number of treatment options, and is a complaint that carries with it a high risk of complications and morbidity.  The differential diagnosis includes ***   Co morbidities that complicate the patient evaluation  ***   Additional history obtained:  Additional history obtained from *** {Blank multiple:19196::"EMS","Family","Nursing","Outside Medical Records","Past Admission"}   External records from outside source obtained and reviewed including ***   Lab Tests:  I Ordered, and personally interpreted labs.  The pertinent results include:  ***   Imaging Studies ordered:  I ordered imaging studies including ***  I independently visualized and interpreted imaging which showed *** I agree with the radiologist interpretation   Cardiac Monitoring:  The patient was maintained on a cardiac monitor.  I personally viewed and interpreted the cardiac monitored which showed an underlying rhythm of: ***   Medicines ordered and prescription drug management:  I ordered medication  including ***  for ***  Reevaluation of the patient after these medicines showed that the patient {resolved/improved/worsened:23923::"improved"} I have reviewed the patients home medicines and have made adjustments as needed   Test Considered:  Troponin, CXR, CT abd pelvis Left AMA prior to completion of ED workup    Problem  List / ED Course:  Asymptomatic hypertension Was neurologically intact at neurology visit.  She is also neurologically tact here in ED.  No visual disturbances nor headache.  Creatinine WNL.  I do not feel that she is having hypertensive emergency at this time. Likely secondary to not taking her blood pressure medication metoprolol for past week I discussed importance of taking medicine as prescribed She refused me providing her a dose of her metoprolol in ED as she has it at home and her ride is here Nausea Diarrhea Abd pain Diffuse abd pain. No leukocytosis   Reevaluation:  After the interventions noted above, I reevaluated the patient and found that they have :{resolved/improved/worsened:23923::"improved"}   Social Determinants of Health:  ***   Dispostion:  Patient wishes to leave AMA prior to completion of ED workup as her ride is here from [employer]. She does not wish to complete any additional lab work nor CXR. I discussed that I have not ruled out acute emergent causes of abd pain, CP, SHOB, however she continues to be adamant about leaving. Overall she is stable appearing. Neurologically intact and capable of leaving AMA.   Date: 04/08/2023 Patient: Sara [name] Admitted: 04/08/2023 10:23 AM Attending Provider: No att. providers found  [name] or her authorized caregiver has made the decision for the patient to leave the emergency department against the advice of No att. providers found.  She or her authorized caregiver has been informed and understands the inherent risks, including death.  She or her authorized caregiver has  decided to accept the responsibility for this decision. [name] and all necessary parties have been advised that she may return for further evaluation or treatment. Her condition at time of discharge was Stable.  [name] had current vital signs as follows:  Blood pressure (!) 175/94, pulse (!) 102, temperature 98.6 F (37 C), resp. rate 18, height 5' 4.5" (1.638 m), weight 74.8 kg, SpO2 100%.   [name] or her authorized caregiver has signed the Leaving Against Medical Advice form prior to leaving the department.  Judithann Sheen 04/08/2023   Final Clinical Impression(s) / ED Diagnoses Final diagnoses:  None    Rx / DC Orders ED Discharge Orders     None

## 2023-04-08 NOTE — ED Provider Triage Note (Addendum)
 Emergency Medicine Provider Triage Evaluation Note  Sara [name] , a 48 y.o. female  was evaluated in triage.  Pt complains of high blood pressure.  Patient reports that she presented to neurology clinic for routine appointment.  She was noted to have elevated blood pressure.  She was sent to the ED for evaluation.  She also complains of concurrent nausea.  This has been ongoing for 2 weeks.  Review of Systems  Positive: Elevated blood pressure.  Nausea. Negative: Shortness of breath, abdominal pain  Physical Exam  BP (!) 198/93   Pulse (!) 109   Temp 98.2 F (36.8 C)   Resp 18   Ht 5' 4.5" (1.638 m)   Wt 74.8 kg   SpO2 100%   BMI 27.88 kg/m  Gen:   Awake, no distress   Resp:  Normal effort  MSK:   Moves extremities without difficulty  Other:    Medical Decision Making  Medically screening exam initiated at 10:57 AM.  Appropriate orders placed.  Sara [name] was informed that the remainder of the evaluation will be completed by another provider, this initial triage assessment does not replace that evaluation, and the importance of remaining in the ED until their evaluation is complete.  Patient understands importance of remaining for entirety of ED evaluation   Wynetta Fines, MD 04/08/23 1103    Wynetta Fines, MD 04/08/23 [phone]

## 2023-04-08 NOTE — ED Triage Notes (Signed)
 Pt reports that she has not been feeling well  having nausea and diarrhea. Pt reports  a small amount of chest pain and that she took her blood pressure and it was high.

## 2023-04-09 NOTE — Progress Notes (Signed)
 I agree with the above plan

## 2023-04-10 LAB — URINE CULTURE: Culture: NO GROWTH

## 2023-04-13 ENCOUNTER — Telehealth: Payer: Self-pay

## 2023-04-13 NOTE — Patient Outreach (Signed)
 First telephone outreach attempt to obtain mRS. Patient answered but asked that I call back later.  [facility]  Population Health Care Management Assistant  Direct Dial: [phone]  Fax: [phone] Website: [email or web address]

## 2023-04-17 ENCOUNTER — Telehealth: Payer: Self-pay

## 2023-04-17 NOTE — Patient Outreach (Signed)
 Telephone outreach to patient to obtain mRS was successfully completed. MRS= 1  Vanice Sarah [facility] Health Care Management Assistant  Direct Dial: [phone]  Fax: [phone] Website: [email or web address]

## 2023-06-14 DIAGNOSIS — I10 Essential (primary) hypertension: Secondary | ICD-10-CM | POA: Diagnosis not present

## 2023-09-24 ENCOUNTER — Other Ambulatory Visit (HOSPITAL_BASED_OUTPATIENT_CLINIC_OR_DEPARTMENT_OTHER): Payer: Self-pay | Admitting: Family Medicine

## 2023-09-24 DIAGNOSIS — Z1231 Encounter for screening mammogram for malignant neoplasm of breast: Secondary | ICD-10-CM

## 2023-10-05 ENCOUNTER — Inpatient Hospital Stay (HOSPITAL_COMMUNITY)
Admission: EM | Admit: 2023-10-05 | Discharge: 2023-10-09 | DRG: 190 | Discharge status: Against Medical Advice | Payer: MEDICAID | Attending: Family Medicine | Admitting: Family Medicine

## 2023-10-05 ENCOUNTER — Encounter (HOSPITAL_COMMUNITY): Payer: Self-pay

## 2023-10-05 ENCOUNTER — Other Ambulatory Visit: Payer: Self-pay

## 2023-10-05 ENCOUNTER — Emergency Department (HOSPITAL_COMMUNITY): Payer: MEDICAID

## 2023-10-05 DIAGNOSIS — T50916A Underdosing of multiple unspecified drugs, medicaments and biological substances, initial encounter: Secondary | ICD-10-CM | POA: Diagnosis present

## 2023-10-05 DIAGNOSIS — Z5329 Procedure and treatment not carried out because of patient's decision for other reasons: Secondary | ICD-10-CM | POA: Diagnosis not present

## 2023-10-05 DIAGNOSIS — E1165 Type 2 diabetes mellitus with hyperglycemia: Secondary | ICD-10-CM | POA: Diagnosis present

## 2023-10-05 DIAGNOSIS — D573 Sickle-cell trait: Secondary | ICD-10-CM | POA: Diagnosis present

## 2023-10-05 DIAGNOSIS — Z833 Family history of diabetes mellitus: Secondary | ICD-10-CM

## 2023-10-05 DIAGNOSIS — G4730 Sleep apnea, unspecified: Secondary | ICD-10-CM | POA: Insufficient documentation

## 2023-10-05 DIAGNOSIS — T361X5A Adverse effect of cephalosporins and other beta-lactam antibiotics, initial encounter: Secondary | ICD-10-CM | POA: Diagnosis not present

## 2023-10-05 DIAGNOSIS — E1151 Type 2 diabetes mellitus with diabetic peripheral angiopathy without gangrene: Secondary | ICD-10-CM | POA: Diagnosis present

## 2023-10-05 DIAGNOSIS — I10 Essential (primary) hypertension: Secondary | ICD-10-CM | POA: Insufficient documentation

## 2023-10-05 DIAGNOSIS — Z79899 Other long term (current) drug therapy: Secondary | ICD-10-CM

## 2023-10-05 DIAGNOSIS — K219 Gastro-esophageal reflux disease without esophagitis: Secondary | ICD-10-CM | POA: Diagnosis present

## 2023-10-05 DIAGNOSIS — Z880 Allergy status to penicillin: Secondary | ICD-10-CM

## 2023-10-05 DIAGNOSIS — L2989 Other pruritus: Secondary | ICD-10-CM | POA: Diagnosis not present

## 2023-10-05 DIAGNOSIS — Z888 Allergy status to other drugs, medicaments and biological substances status: Secondary | ICD-10-CM

## 2023-10-05 DIAGNOSIS — Z90710 Acquired absence of both cervix and uterus: Secondary | ICD-10-CM

## 2023-10-05 DIAGNOSIS — E872 Acidosis, unspecified: Secondary | ICD-10-CM | POA: Diagnosis not present

## 2023-10-05 DIAGNOSIS — I110 Hypertensive heart disease with heart failure: Secondary | ICD-10-CM | POA: Diagnosis present

## 2023-10-05 DIAGNOSIS — R809 Proteinuria, unspecified: Secondary | ICD-10-CM | POA: Diagnosis present

## 2023-10-05 DIAGNOSIS — E119 Type 2 diabetes mellitus without complications: Secondary | ICD-10-CM

## 2023-10-05 DIAGNOSIS — Z91141 Patient's other noncompliance with medication regimen due to financial hardship: Secondary | ICD-10-CM

## 2023-10-05 DIAGNOSIS — E785 Hyperlipidemia, unspecified: Secondary | ICD-10-CM | POA: Insufficient documentation

## 2023-10-05 DIAGNOSIS — D649 Anemia, unspecified: Secondary | ICD-10-CM | POA: Diagnosis present

## 2023-10-05 DIAGNOSIS — J189 Pneumonia, unspecified organism: Secondary | ICD-10-CM | POA: Diagnosis present

## 2023-10-05 DIAGNOSIS — I5030 Unspecified diastolic (congestive) heart failure: Secondary | ICD-10-CM | POA: Insufficient documentation

## 2023-10-05 DIAGNOSIS — Z1152 Encounter for screening for COVID-19: Secondary | ICD-10-CM

## 2023-10-05 DIAGNOSIS — F17200 Nicotine dependence, unspecified, uncomplicated: Secondary | ICD-10-CM | POA: Insufficient documentation

## 2023-10-05 DIAGNOSIS — Z5901 Sheltered homelessness: Secondary | ICD-10-CM

## 2023-10-05 DIAGNOSIS — I5032 Chronic diastolic (congestive) heart failure: Secondary | ICD-10-CM | POA: Diagnosis present

## 2023-10-05 DIAGNOSIS — G4733 Obstructive sleep apnea (adult) (pediatric): Secondary | ICD-10-CM | POA: Diagnosis present

## 2023-10-05 DIAGNOSIS — Z800 Family history of malignant neoplasm of digestive organs: Secondary | ICD-10-CM

## 2023-10-05 DIAGNOSIS — E875 Hyperkalemia: Secondary | ICD-10-CM | POA: Diagnosis not present

## 2023-10-05 DIAGNOSIS — J209 Acute bronchitis, unspecified: Secondary | ICD-10-CM | POA: Diagnosis present

## 2023-10-05 DIAGNOSIS — E782 Mixed hyperlipidemia: Secondary | ICD-10-CM | POA: Diagnosis present

## 2023-10-05 DIAGNOSIS — E559 Vitamin D deficiency, unspecified: Secondary | ICD-10-CM | POA: Diagnosis present

## 2023-10-05 DIAGNOSIS — F17210 Nicotine dependence, cigarettes, uncomplicated: Secondary | ICD-10-CM | POA: Diagnosis present

## 2023-10-05 DIAGNOSIS — Z832 Family history of diseases of the blood and blood-forming organs and certain disorders involving the immune mechanism: Secondary | ICD-10-CM

## 2023-10-05 DIAGNOSIS — Z8249 Family history of ischemic heart disease and other diseases of the circulatory system: Secondary | ICD-10-CM

## 2023-10-05 DIAGNOSIS — R042 Hemoptysis: Secondary | ICD-10-CM | POA: Diagnosis present

## 2023-10-05 DIAGNOSIS — J440 Chronic obstructive pulmonary disease with acute lower respiratory infection: Secondary | ICD-10-CM | POA: Diagnosis present

## 2023-10-05 DIAGNOSIS — R81 Glycosuria: Secondary | ICD-10-CM | POA: Diagnosis present

## 2023-10-05 DIAGNOSIS — Z794 Long term (current) use of insulin: Secondary | ICD-10-CM

## 2023-10-05 DIAGNOSIS — Z881 Allergy status to other antibiotic agents status: Secondary | ICD-10-CM

## 2023-10-05 DIAGNOSIS — F32A Depression, unspecified: Secondary | ICD-10-CM | POA: Diagnosis present

## 2023-10-05 DIAGNOSIS — J441 Chronic obstructive pulmonary disease with (acute) exacerbation: Principal | ICD-10-CM | POA: Diagnosis present

## 2023-10-05 DIAGNOSIS — R0602 Shortness of breath: Principal | ICD-10-CM

## 2023-10-05 DIAGNOSIS — Z7982 Long term (current) use of aspirin: Secondary | ICD-10-CM

## 2023-10-05 DIAGNOSIS — R739 Hyperglycemia, unspecified: Secondary | ICD-10-CM

## 2023-10-05 DIAGNOSIS — G8929 Other chronic pain: Secondary | ICD-10-CM | POA: Diagnosis present

## 2023-10-05 DIAGNOSIS — N179 Acute kidney failure, unspecified: Secondary | ICD-10-CM | POA: Diagnosis present

## 2023-10-05 LAB — URINALYSIS, ROUTINE W REFLEX MICROSCOPIC
Bilirubin Urine: NEGATIVE
Glucose, UA: >=500 mg/dL — AB
Ketones, ur: NEGATIVE mg/dL
Leukocytes,Ua: NEGATIVE
Nitrite: NEGATIVE
Protein, ur: >=300 mg/dL — AB
Specific Gravity, Urine: 1.012 (ref 1.005–1.030)
pH: 6 (ref 5.0–8.0)

## 2023-10-05 LAB — BASIC METABOLIC PANEL WITH GFR
Anion gap: 12 (ref 5–15)
BUN: 32 mg/dL — ABNORMAL HIGH (ref 6–20)
CO2: 19 mmol/L — ABNORMAL LOW (ref 22–32)
Calcium: 8.7 mg/dL — ABNORMAL LOW (ref 8.9–10.3)
Chloride: 105 mmol/L (ref 98–111)
Creatinine, Ser: 1.81 mg/dL — ABNORMAL HIGH (ref 0.44–1.00)
GFR, Estimated: 34 mL/min — ABNORMAL LOW (ref >60)
Glucose, Bld: 334 mg/dL — ABNORMAL HIGH (ref 70–99)
Potassium: 4.6 mmol/L (ref 3.5–5.1)
Sodium: 136 mmol/L (ref 135–145)

## 2023-10-05 LAB — CBC
HCT: 26.9 % — ABNORMAL LOW (ref 36.0–46.0)
Hemoglobin: 8.5 g/dL — ABNORMAL LOW (ref 12.0–15.0)
MCH: 27.7 pg (ref 26.0–34.0)
MCHC: 31.6 g/dL (ref 30.0–36.0)
MCV: 87.6 fL (ref 80.0–100.0)
Platelets: 253 K/uL (ref 150–400)
RBC: 3.07 MIL/uL — ABNORMAL LOW (ref 3.87–5.11)
RDW: 13.4 % (ref 11.5–15.5)
WBC: 15.2 K/uL — ABNORMAL HIGH (ref 4.0–10.5)
nRBC: 0 % (ref 0.0–0.2)

## 2023-10-05 LAB — POC OCCULT BLOOD, ED: Fecal Occult Bld: NEGATIVE

## 2023-10-05 LAB — CBG MONITORING, ED: Glucose-Capillary: 333 mg/dL — ABNORMAL HIGH (ref 70–99)

## 2023-10-05 LAB — I-STAT CHEM 8, ED
BUN: 30 mg/dL — ABNORMAL HIGH (ref 6–20)
Calcium, Ion: 1.13 mmol/L — ABNORMAL LOW (ref 1.15–1.40)
Chloride: 110 mmol/L (ref 98–111)
Creatinine, Ser: 1.9 mg/dL — ABNORMAL HIGH (ref 0.44–1.00)
Glucose, Bld: 335 mg/dL — ABNORMAL HIGH (ref 70–99)
HCT: 32 % — ABNORMAL LOW (ref 36.0–46.0)
Hemoglobin: 10.9 g/dL — ABNORMAL LOW (ref 12.0–15.0)
Potassium: 4.7 mmol/L (ref 3.5–5.1)
Sodium: 138 mmol/L (ref 135–145)
TCO2: 20 mmol/L — ABNORMAL LOW (ref 22–32)

## 2023-10-05 LAB — BRAIN NATRIURETIC PEPTIDE: B Natriuretic Peptide: 147.5 pg/mL — ABNORMAL HIGH (ref 0.0–100.0)

## 2023-10-05 LAB — TROPONIN I (HIGH SENSITIVITY)
Troponin I (High Sensitivity): 24 ng/L — ABNORMAL HIGH (ref <18)
Troponin I (High Sensitivity): 24 ng/L — ABNORMAL HIGH (ref <18)

## 2023-10-05 MED ORDER — ONDANSETRON 4 MG PO TBDP
8.0000 mg | ORAL_TABLET | Freq: Once | ORAL | Status: AC
  Administered 2023-10-05: 8 mg via ORAL
  Filled 2023-10-05: qty 2

## 2023-10-05 NOTE — ED Notes (Signed)
 2nd Trop due @2009 

## 2023-10-05 NOTE — ED Triage Notes (Signed)
 PT BIB [facility] from a friends house D/t Cough last couple weeks c/o blood in suptum today. Pt states last week she was a [facility] for a week breathing where she was given breathing treatments. She has been out of inhaler, insulin  , lasix since she was released from [facility] on Friday  She has wheezing bilateral, Hx asthma, swelling on both lower legs. EMS gave duo neb she stated she still feels tight, lungs clear CBG 360.

## 2023-10-05 NOTE — ED Provider Notes (Signed)
 [place] [facility] AT [facility] Provider Note   CSN: [id number] Arrival date & time: 10/05/23  1626     Patient presents with: No chief complaint on file.   Sara [name] is a 48 y.o. female.  {Add pertinent medical, surgical, social history, OB history to HPI:32947} 48 y/o female with hx of HTN, HLD, DM, OSA, tobacco use presents to the ED for evaluation of chest pain and SOB. States she had a recent admission at [facility] for same. Sounds like patient signed out AMA on Friday. She felt okay on Saturday, but developed central chest heaviness again on Sunday which has persisted. Notes sensation of chest congestion with cough. Today reports coughing up frank blood. She feels SOB which is worsened with exertion. Notes some nausea and recurrent swelling in bilateral feet. She reports receiving Lasix during her recent admission, but was not discharged with any meds given her signing out AMA. She has continued daily metoprolol  and lisinopril , evening statin. Is out of her inhaler and insulin .  The history is provided by the patient. No language interpreter was used.       Prior to Admission medications   Medication Sig Start Date End Date Taking? Authorizing Provider  acetaminophen  (TYLENOL ) 500 MG tablet Take 1,000 mg by mouth every 8 (eight) hours as needed for moderate pain (pain score 4-6).    [provider]  ARIPiprazole  (ABILIFY ) 10 MG tablet Take 1 tablet (10 mg total) by mouth daily. Patient not taking: Reported on 01/09/2023 10/22/22   Raye Ellouise BROCKS, FNP  aspirin  EC 81 MG tablet Take 1 tablet (81 mg total) by mouth daily. Swallow whole. 01/12/23   Remi Pippin, NP  atorvastatin  (LIPITOR) 40 MG tablet Take 1 tablet (40 mg total) by mouth daily. 01/12/23   Remi Pippin, NP  dapagliflozin propanediol (FARXIGA) 5 MG TABS tablet Take 5 mg by mouth daily. Patient stated they are suppose to be sending medication. 01/09/2023. Patient not taking:  Reported on 04/08/2023 08/07/22   [provider]  FLUoxetine  (PROZAC ) 20 MG capsule Take 1 capsule (20 mg total) by mouth daily. Patient not taking: Reported on 01/09/2023 10/21/22 11/20/22  Raye Ellouise BROCKS, FNP  gabapentin  (NEURONTIN ) 300 MG capsule Take 2 capsules (600 mg total) by mouth 3 (three) times daily. 10/21/22 04/08/23  Ntuen, Tina C, FNP  hydrOXYzine  (ATARAX ) 25 MG tablet Take 1 tablet (25 mg total) by mouth every 6 (six) hours as needed for anxiety. Patient not taking: Reported on 01/09/2023 10/21/22   Ntuen, Tina C, FNP  insulin  aspart (NOVOLOG ) 100 UNIT/ML injection Inject 0-15 Units into the skin 3 (three) times daily with meals. 10/21/22   Ntuen, Tina C, FNP  insulin  aspart (NOVOLOG ) 100 UNIT/ML injection Inject 0-5 Units into the skin at bedtime. 10/21/22   Ntuen, Tina C, FNP  insulin  glargine-yfgn (SEMGLEE ) 100 UNIT/ML injection Inject 0.16 mLs (16 Units total) into the skin at bedtime. Patient not taking: Reported on 01/09/2023 10/21/22   Ntuen, Tina C, FNP  linaclotide  (LINZESS ) 145 MCG CAPS capsule Take 1 capsule (145 mcg total) by mouth daily before breakfast. Patient not taking: Reported on 01/09/2023 10/22/22   Ntuen, Tina C, FNP  metoprolol  succinate (TOPROL -XL) 25 MG 24 hr tablet Take 0.5 tablets (12.5 mg total) by mouth daily. 10/22/22   Ntuen, Tina C, FNP  nicotine  (NICODERM CQ  - DOSED IN MG/24 HOURS) 21 mg/24hr patch Place 1 patch (21 mg total) onto the skin daily. Patient not taking: Reported on  04/08/2023 01/12/23   Remi Pippin, NP  nicotine  polacrilex (NICORETTE ) 2 MG gum Take 1 each (2 mg total) by mouth as needed for smoking cessation. Patient not taking: Reported on 01/09/2023 10/21/22   Raye Ellouise BROCKS, FNP  omeprazole  (PRILOSEC) 40 MG capsule Take 40 mg by mouth daily.    [provider]  ondansetron  (ZOFRAN -ODT) 4 MG disintegrating tablet Take 1 tablet (4 mg total) by mouth every 8 (eight) hours as needed for nausea or vomiting. 04/08/23   Minnie Tinnie BRAVO,  PA  oxyCODONE -acetaminophen  (PERCOCET/ROXICET) 5-325 MG tablet Take 1 tablet by mouth every 8 (eight) hours as needed for moderate pain. 10/21/22   Ntuen, Tina C, FNP    Allergies: Clarithromycin , Doxycycline, Penicillins, Tetracyclines & related, Metronidazole , and Red dye #40 (allura red)    Review of Systems Ten systems reviewed and are negative for acute change, except as noted in the HPI.    Updated Vital Signs BP (!) 193/99   Pulse (!) 108   Temp 98.6 F (37 C)   Resp 20   SpO2 100%   Physical Exam Vitals and nursing note reviewed.  Constitutional:      General: She is not in acute distress.    Appearance: She is well-developed. She is not diaphoretic.     Comments: Pleasant, alert AA female, sitting upright in bed.  HENT:     Head: Normocephalic and atraumatic.  Eyes:     General: No scleral icterus.    Conjunctiva/sclera: Conjunctivae normal.  Cardiovascular:     Rate and Rhythm: Regular rhythm. Tachycardia present.     Pulses: Normal pulses.  Pulmonary:     Effort: Pulmonary effort is normal. No respiratory distress.     Breath sounds: No stridor. No wheezing.     Comments: Lungs grossly CTAB. Appears dyspneic without tachypnea. No overt distress. Genitourinary:    Comments: Exam chaperoned by RN. Brown stool on DRE. Musculoskeletal:        General: Normal range of motion.     Cervical back: Normal range of motion.     Comments: Mild edema to BLE and feet.   Skin:    General: Skin is warm and dry.     Coloration: Skin is not pale.     Findings: No erythema or rash.  Neurological:     Mental Status: She is alert and oriented to person, place, and time.  Psychiatric:        Behavior: Behavior normal.     (all labs ordered are listed, but only abnormal results are displayed) Labs Reviewed  BASIC METABOLIC PANEL WITH GFR - Abnormal; Notable for the following components:      Result Value   CO2 19 (*)    Glucose, Bld 334 (*)    BUN 32 (*)    Creatinine,  Ser 1.81 (*)    Calcium  8.7 (*)    GFR, Estimated 34 (*)    All other components within normal limits  CBC - Abnormal; Notable for the following components:   WBC 15.2 (*)    RBC 3.07 (*)    Hemoglobin 8.5 (*)    HCT 26.9 (*)    All other components within normal limits  URINALYSIS, ROUTINE W REFLEX MICROSCOPIC - Abnormal; Notable for the following components:   Glucose, UA >=500 (*)    Hgb urine dipstick MODERATE (*)    Protein, ur >=300 (*)    Bacteria, UA RARE (*)    All other components within normal limits  BRAIN NATRIURETIC PEPTIDE - Abnormal; Notable for the following components:   B Natriuretic Peptide 147.5 (*)    All other components within normal limits  CBG MONITORING, ED - Abnormal; Notable for the following components:   Glucose-Capillary 333 (*)    All other components within normal limits  I-STAT CHEM 8, ED - Abnormal; Notable for the following components:   BUN 30 (*)    Creatinine, Ser 1.90 (*)    Glucose, Bld 335 (*)    Calcium , Ion 1.13 (*)    TCO2 20 (*)    Hemoglobin 10.9 (*)    HCT 32.0 (*)    All other components within normal limits  TROPONIN I (HIGH SENSITIVITY) - Abnormal; Notable for the following components:   Troponin I (High Sensitivity) 24 (*)    All other components within normal limits  TROPONIN I (HIGH SENSITIVITY) - Abnormal; Notable for the following components:   Troponin I (High Sensitivity) 24 (*)    All other components within normal limits  POC OCCULT BLOOD, ED  CBG MONITORING, ED    EKG: EKG Interpretation Date/Time:  Monday October 05 2023 16:53:48 EDT Ventricular Rate:  110 PR Interval:  118 QRS Duration:  74 QT Interval:  316 QTC Calculation: 427 R Axis:   75  Text Interpretation: Sinus tachycardia Otherwise normal ECG When compared with ECG of 08-Apr-2023 10:45, PREVIOUS ECG IS PRESENT Confirmed by Cleotilde Rogue ([id number]) on 10/05/2023 10:23:01 PM  Radiology: [facility] Chest 2 View Result Date: 10/05/2023 CLINICAL DATA:  CP  SOB EXAM: CHEST - 2 VIEW COMPARISON:  None available. FINDINGS: Diffuse interstitial opacities throughout both lungs. No focal airspace consolidation, pleural effusion, or pneumothorax. No cardiomegaly.No acute fracture or destructive lesion. IMPRESSION: Bilateral perihilar interstitial opacities, which may represent interstitial edema or atypical/viral infection, in the correct clinical context. Electronically Signed   By: Rogelia Myers M.D.   On: 10/05/2023 17:56    {Document cardiac monitor, telemetry assessment procedure when appropriate:32947} Procedures   Medications Ordered in the ED  ondansetron  (ZOFRAN -ODT) disintegrating tablet 8 mg (8 mg Oral Given 10/05/23 1751)    Clinical Course as of 10/05/23 2324  Mon Oct 05, 2023  2315 Patient complaining of chest pain and shortness of breath.  It sounds that she was admitted for similar complaints at [facility].  She signed out AMA 3 days ago.  Reports today that she coughed up frank blood.  This may be related to bronchitis, but pulmonary embolus remains a concern given her tachycardia and recent hospitalization.  She is not chronically on anticoagulation.  Does suggest having had a CTA while admitted at [facility] which was negative for pulmonary embolus.  Sounds like she was treated more for CHF exacerbation and diuresed.  I am unable to see the patient's CBC results from her recent admission, but her hemoglobin today is 8.5 which is certainly a drop from her baseline of around 13.  This change has happened more acutely, in the past 6 months.  She denies any increased bleeding or bruising.  No longer has a menstrual cycle with history of total hysterectomy.  Denies melena and hematochezia.  She did not have any gross blood or melena on bedside DRE.  Acute anemia could be contributing to patient's shortness of breath and dyspnea on exertion.  She also has evidence of AKI. Creatinine is 1.81, up from 0.8 in March. AKI could be responsible for mild  troponin elevation of 24. Repeat is stable, flat. Troponin leak may also be secondary  to hypertensive urgency/emergency. Systolic BPs have been in the 819-809'd in the ED despite reported medication compliance. Historically is usually around 140-160 systolic.  Hyperglycemia present without evidence of DKA. C/w reported noncompliance with insulin . [KH]  2322 Will attempt to obtain records from [facility] to see which avenues need continued evaluation. CTA held pending records review. Patient also states she had an echocardiogram completed during her recent admission. [KH]  2323 DG Chest 2 View Suspect mild interstitial edema over infection. No O2 requirement while at rest. May ambulate to assess for hypoxemia on exertion. BNP with only mild elevation at 147.5. Does not appear overtly fluids overloaded on exam. [KH]    Clinical Course User Index [KH] Keith Sor, PA-C   {Click here for ABCD2, HEART and other calculators REFRESH Note before signing:1}                              Medical Decision Making Amount and/or Complexity of Data Reviewed Labs: ordered. Radiology: ordered.   ***  {Document critical care time when appropriate  Document review of labs and clinical decision tools ie CHADS2VASC2, etc  Document your independent review of radiology images and any outside records  Document your discussion with family members, caretakers and with consultants  Document social determinants of health affecting pt's care  Document your decision making why or why not admission, treatments were needed:32947:::1}   Final diagnoses:  None    ED Discharge Orders     None

## 2023-10-05 NOTE — ED Provider Triage Note (Signed)
 Emergency Medicine Provider Triage Evaluation Note  Sara [name] , a 48 y.o. female  was evaluated in triage.  Pt complains of shortness of breath and chest pain.  History of CHF, diabetes, asthma.  Recently admitted in [facility] for CHF exacerbation  Review of Systems  Positive: Chest pain, shortness of breath, nausea Negative: Fever, chills, vomiting, abdominal pain, diarrhea  Physical Exam  BP 139/75   Pulse (!) 109   Temp 98.4 F (36.9 C)   Resp 16   SpO2 98%  Gen:   Awake, no distress   Resp:  Normal effort  MSK:   Moves extremities without difficulty  Other:    Medical Decision Making  Medically screening exam initiated at 5:21 PM.  Appropriate orders placed.  Sara [name] was informed that the remainder of the evaluation will be completed by another provider, this initial triage assessment does not replace that evaluation, and the importance of remaining in the ED until their evaluation is complete.  Labs and imaging ordered   Francis Ileana SAILOR, DEVONNA 10/05/23 8277

## 2023-10-05 NOTE — ED Notes (Signed)
 Patient transported to X-ray

## 2023-10-06 ENCOUNTER — Emergency Department (HOSPITAL_COMMUNITY): Payer: MEDICAID

## 2023-10-06 DIAGNOSIS — E782 Mixed hyperlipidemia: Secondary | ICD-10-CM | POA: Diagnosis present

## 2023-10-06 DIAGNOSIS — G4733 Obstructive sleep apnea (adult) (pediatric): Secondary | ICD-10-CM | POA: Diagnosis present

## 2023-10-06 DIAGNOSIS — E1165 Type 2 diabetes mellitus with hyperglycemia: Secondary | ICD-10-CM | POA: Diagnosis present

## 2023-10-06 DIAGNOSIS — J209 Acute bronchitis, unspecified: Secondary | ICD-10-CM | POA: Diagnosis present

## 2023-10-06 DIAGNOSIS — I10 Essential (primary) hypertension: Secondary | ICD-10-CM | POA: Insufficient documentation

## 2023-10-06 DIAGNOSIS — E785 Hyperlipidemia, unspecified: Secondary | ICD-10-CM | POA: Insufficient documentation

## 2023-10-06 DIAGNOSIS — J189 Pneumonia, unspecified organism: Secondary | ICD-10-CM | POA: Diagnosis present

## 2023-10-06 DIAGNOSIS — Z8249 Family history of ischemic heart disease and other diseases of the circulatory system: Secondary | ICD-10-CM | POA: Diagnosis not present

## 2023-10-06 DIAGNOSIS — I110 Hypertensive heart disease with heart failure: Secondary | ICD-10-CM | POA: Diagnosis present

## 2023-10-06 DIAGNOSIS — J441 Chronic obstructive pulmonary disease with (acute) exacerbation: Secondary | ICD-10-CM | POA: Diagnosis present

## 2023-10-06 DIAGNOSIS — D649 Anemia, unspecified: Secondary | ICD-10-CM | POA: Diagnosis present

## 2023-10-06 DIAGNOSIS — E875 Hyperkalemia: Secondary | ICD-10-CM | POA: Diagnosis not present

## 2023-10-06 DIAGNOSIS — N179 Acute kidney failure, unspecified: Secondary | ICD-10-CM | POA: Diagnosis present

## 2023-10-06 DIAGNOSIS — R042 Hemoptysis: Secondary | ICD-10-CM | POA: Diagnosis present

## 2023-10-06 DIAGNOSIS — K219 Gastro-esophageal reflux disease without esophagitis: Secondary | ICD-10-CM | POA: Diagnosis present

## 2023-10-06 DIAGNOSIS — Z1152 Encounter for screening for COVID-19: Secondary | ICD-10-CM | POA: Diagnosis not present

## 2023-10-06 DIAGNOSIS — E872 Acidosis, unspecified: Secondary | ICD-10-CM | POA: Diagnosis not present

## 2023-10-06 DIAGNOSIS — Z79899 Other long term (current) drug therapy: Secondary | ICD-10-CM | POA: Diagnosis not present

## 2023-10-06 DIAGNOSIS — G4730 Sleep apnea, unspecified: Secondary | ICD-10-CM | POA: Diagnosis not present

## 2023-10-06 DIAGNOSIS — Z5901 Sheltered homelessness: Secondary | ICD-10-CM | POA: Diagnosis not present

## 2023-10-06 DIAGNOSIS — F32A Depression, unspecified: Secondary | ICD-10-CM | POA: Diagnosis present

## 2023-10-06 DIAGNOSIS — J208 Acute bronchitis due to other specified organisms: Secondary | ICD-10-CM | POA: Diagnosis not present

## 2023-10-06 DIAGNOSIS — F17200 Nicotine dependence, unspecified, uncomplicated: Secondary | ICD-10-CM | POA: Diagnosis not present

## 2023-10-06 DIAGNOSIS — E1151 Type 2 diabetes mellitus with diabetic peripheral angiopathy without gangrene: Secondary | ICD-10-CM | POA: Diagnosis present

## 2023-10-06 DIAGNOSIS — I5030 Unspecified diastolic (congestive) heart failure: Secondary | ICD-10-CM | POA: Insufficient documentation

## 2023-10-06 DIAGNOSIS — R079 Chest pain, unspecified: Secondary | ICD-10-CM | POA: Diagnosis present

## 2023-10-06 DIAGNOSIS — Z833 Family history of diabetes mellitus: Secondary | ICD-10-CM | POA: Diagnosis not present

## 2023-10-06 DIAGNOSIS — E119 Type 2 diabetes mellitus without complications: Secondary | ICD-10-CM

## 2023-10-06 DIAGNOSIS — J440 Chronic obstructive pulmonary disease with acute lower respiratory infection: Secondary | ICD-10-CM | POA: Diagnosis present

## 2023-10-06 DIAGNOSIS — Z794 Long term (current) use of insulin: Secondary | ICD-10-CM | POA: Diagnosis not present

## 2023-10-06 DIAGNOSIS — D573 Sickle-cell trait: Secondary | ICD-10-CM | POA: Diagnosis present

## 2023-10-06 DIAGNOSIS — I5032 Chronic diastolic (congestive) heart failure: Secondary | ICD-10-CM | POA: Diagnosis present

## 2023-10-06 LAB — COMPREHENSIVE METABOLIC PANEL WITH GFR
ALT: 14 U/L (ref 0–44)
AST: 16 U/L (ref 15–41)
Albumin: 2.2 g/dL — ABNORMAL LOW (ref 3.5–5.0)
Alkaline Phosphatase: 57 U/L (ref 38–126)
Anion gap: 8 (ref 5–15)
BUN: 28 mg/dL — ABNORMAL HIGH (ref 6–20)
CO2: 20 mmol/L — ABNORMAL LOW (ref 22–32)
Calcium: 8.2 mg/dL — ABNORMAL LOW (ref 8.9–10.3)
Chloride: 112 mmol/L — ABNORMAL HIGH (ref 98–111)
Creatinine, Ser: 1.89 mg/dL — ABNORMAL HIGH (ref 0.44–1.00)
GFR, Estimated: 32 mL/min — ABNORMAL LOW (ref >60)
Glucose, Bld: 125 mg/dL — ABNORMAL HIGH (ref 70–99)
Potassium: 4.3 mmol/L (ref 3.5–5.1)
Sodium: 140 mmol/L (ref 135–145)
Total Bilirubin: 0.4 mg/dL (ref 0.0–1.2)
Total Protein: 5.3 g/dL — ABNORMAL LOW (ref 6.5–8.1)

## 2023-10-06 LAB — HEPATIC FUNCTION PANEL
ALT: 15 U/L (ref 0–44)
AST: 18 U/L (ref 15–41)
Albumin: 2.4 g/dL — ABNORMAL LOW (ref 3.5–5.0)
Alkaline Phosphatase: 61 U/L (ref 38–126)
Bilirubin, Direct: 0.2 mg/dL (ref 0.0–0.2)
Indirect Bilirubin: 0.2 mg/dL — ABNORMAL LOW (ref 0.3–0.9)
Total Bilirubin: 0.4 mg/dL (ref 0.0–1.2)
Total Protein: 5.5 g/dL — ABNORMAL LOW (ref 6.5–8.1)

## 2023-10-06 LAB — CBC
HCT: 26.8 % — ABNORMAL LOW (ref 36.0–46.0)
HCT: 27.7 % — ABNORMAL LOW (ref 36.0–46.0)
Hemoglobin: 8.6 g/dL — ABNORMAL LOW (ref 12.0–15.0)
Hemoglobin: 8.7 g/dL — ABNORMAL LOW (ref 12.0–15.0)
MCH: 27.5 pg (ref 26.0–34.0)
MCH: 27.3 pg (ref 26.0–34.0)
MCHC: 32.1 g/dL (ref 30.0–36.0)
MCHC: 31.4 g/dL (ref 30.0–36.0)
MCV: 85.6 fL (ref 80.0–100.0)
MCV: 86.8 fL (ref 80.0–100.0)
Platelets: 178 K/uL (ref 150–400)
Platelets: 175 K/uL (ref 150–400)
RBC: 3.13 MIL/uL — ABNORMAL LOW (ref 3.87–5.11)
RBC: 3.19 MIL/uL — ABNORMAL LOW (ref 3.87–5.11)
RDW: 13.5 % (ref 11.5–15.5)
RDW: 13.5 % (ref 11.5–15.5)
WBC: 10.8 K/uL — ABNORMAL HIGH (ref 4.0–10.5)
WBC: 10.9 K/uL — ABNORMAL HIGH (ref 4.0–10.5)
nRBC: 0 % (ref 0.0–0.2)
nRBC: 0 % (ref 0.0–0.2)

## 2023-10-06 LAB — VITAMIN B12: Vitamin B-12: 718 pg/mL (ref 180–914)

## 2023-10-06 LAB — RESP PANEL BY RT-PCR (RSV, FLU A&B, COVID)  RVPGX2
Influenza A by PCR: NEGATIVE
Influenza B by PCR: NEGATIVE
Resp Syncytial Virus by PCR: NEGATIVE
SARS Coronavirus 2 by RT PCR: NEGATIVE

## 2023-10-06 LAB — IRON AND TIBC
Iron: 39 ug/dL (ref 28–170)
Saturation Ratios: 14 % (ref 10.4–31.8)
TIBC: 274 ug/dL (ref 250–450)
UIBC: 236 ug/dL

## 2023-10-06 LAB — HEMOGLOBIN A1C
Hgb A1c MFr Bld: 8.6 % — ABNORMAL HIGH (ref 4.8–5.6)
Mean Plasma Glucose: 200.12 mg/dL

## 2023-10-06 LAB — GLUCOSE, CAPILLARY
Glucose-Capillary: 121 mg/dL — ABNORMAL HIGH (ref 70–99)
Glucose-Capillary: 175 mg/dL — ABNORMAL HIGH (ref 70–99)

## 2023-10-06 LAB — FOLATE: Folate: 12.6 ng/mL (ref >5.9)

## 2023-10-06 LAB — CBG MONITORING, ED
Glucose-Capillary: 361 mg/dL — ABNORMAL HIGH (ref 70–99)
Glucose-Capillary: 251 mg/dL — ABNORMAL HIGH (ref 70–99)
Glucose-Capillary: 139 mg/dL — ABNORMAL HIGH (ref 70–99)
Glucose-Capillary: 219 mg/dL — ABNORMAL HIGH (ref 70–99)

## 2023-10-06 LAB — MAGNESIUM: Magnesium: 2 mg/dL (ref 1.7–2.4)

## 2023-10-06 LAB — FERRITIN: Ferritin: 152 ng/mL (ref 11–307)

## 2023-10-06 LAB — PHOSPHORUS: Phosphorus: 3.9 mg/dL (ref 2.5–4.6)

## 2023-10-06 MED ORDER — ACETAMINOPHEN 325 MG PO TABS
650.0000 mg | ORAL_TABLET | Freq: Four times a day (QID) | ORAL | Status: DC | PRN
  Administered 2023-10-07 – 2023-10-08 (×2): 650 mg via ORAL
  Filled 2023-10-06 (×2): qty 2

## 2023-10-06 MED ORDER — INSULIN ASPART 100 UNIT/ML IJ SOLN: 0.0000 [IU] | Freq: Every day | INTRAMUSCULAR | Status: DC

## 2023-10-06 MED ORDER — MELATONIN 5 MG PO TABS
5.0000 mg | ORAL_TABLET | Freq: Every evening | ORAL | Status: DC | PRN
  Administered 2023-10-06: 5 mg via ORAL
  Filled 2023-10-06: qty 1

## 2023-10-06 MED ORDER — ALBUTEROL SULFATE (2.5 MG/3ML) 0.083% IN NEBU
2.5000 mg | INHALATION_SOLUTION | RESPIRATORY_TRACT | Status: DC | PRN
  Administered 2023-10-08: 2.5 mg via RESPIRATORY_TRACT
  Filled 2023-10-06 (×2): qty 3

## 2023-10-06 MED ORDER — IPRATROPIUM-ALBUTEROL 0.5-2.5 (3) MG/3ML IN SOLN: 3.0000 mL | Freq: Four times a day (QID) | RESPIRATORY_TRACT | Status: DC | PRN

## 2023-10-06 MED ORDER — ONDANSETRON HCL 4 MG PO TABS: 4.0000 mg | ORAL_TABLET | Freq: Four times a day (QID) | ORAL | Status: DC | PRN

## 2023-10-06 MED ORDER — ONDANSETRON HCL 4 MG/2ML IJ SOLN
4.0000 mg | Freq: Four times a day (QID) | INTRAMUSCULAR | Status: DC | PRN
  Administered 2023-10-06 – 2023-10-08 (×3): 4 mg via INTRAVENOUS
  Filled 2023-10-06 (×3): qty 2

## 2023-10-06 MED ORDER — ATORVASTATIN CALCIUM 40 MG PO TABS
40.0000 mg | ORAL_TABLET | Freq: Every day | ORAL | Status: DC
  Administered 2023-10-06 – 2023-10-07 (×2): 40 mg via ORAL
  Filled 2023-10-06 (×3): qty 1

## 2023-10-06 MED ORDER — ACETAMINOPHEN 650 MG RE SUPP: 650.0000 mg | Freq: Four times a day (QID) | RECTAL | Status: DC | PRN

## 2023-10-06 MED ORDER — ENOXAPARIN SODIUM 40 MG/0.4ML IJ SOSY
40.0000 mg | PREFILLED_SYRINGE | INTRAMUSCULAR | Status: DC
Start: 2023-10-06 — End: 2023-10-06
  Administered 2023-10-06: 40 mg via SUBCUTANEOUS
  Filled 2023-10-06: qty 0.4

## 2023-10-06 MED ORDER — GUAIFENESIN ER 600 MG PO TB12
1200.0000 mg | ORAL_TABLET | Freq: Two times a day (BID) | ORAL | Status: DC
  Administered 2023-10-06 (×2): 1200 mg via ORAL
  Filled 2023-10-06 (×6): qty 2

## 2023-10-06 MED ORDER — FLUTICASONE FUROATE-VILANTEROL 100-25 MCG/ACT IN AEPB
1.0000 | INHALATION_SPRAY | Freq: Every day | RESPIRATORY_TRACT | Status: DC
  Administered 2023-10-06 – 2023-10-08 (×3): 1 via RESPIRATORY_TRACT
  Filled 2023-10-06: qty 28

## 2023-10-06 MED ORDER — IOHEXOL 350 MG/ML SOLN
75.0000 mL | Freq: Once | INTRAVENOUS | Status: AC | PRN
  Administered 2023-10-06: 75 mL via INTRAVENOUS

## 2023-10-06 MED ORDER — METOPROLOL SUCCINATE ER 25 MG PO TB24
12.5000 mg | ORAL_TABLET | Freq: Every day | ORAL | Status: DC
  Administered 2023-10-06 – 2023-10-08 (×3): 12.5 mg via ORAL
  Filled 2023-10-06 (×3): qty 1

## 2023-10-06 MED ORDER — SODIUM CHLORIDE 0.9 % IV BOLUS
500.0000 mL | Freq: Once | INTRAVENOUS | Status: AC
  Administered 2023-10-06: 500 mL via INTRAVENOUS

## 2023-10-06 MED ORDER — NICOTINE 14 MG/24HR TD PT24
14.0000 mg | MEDICATED_PATCH | Freq: Every day | TRANSDERMAL | Status: DC
  Filled 2023-10-06 (×2): qty 1

## 2023-10-06 MED ORDER — POLYETHYLENE GLYCOL 3350 17 G PO PACK: 17.0000 g | PACK | Freq: Every day | ORAL | Status: DC | PRN

## 2023-10-06 MED ORDER — SODIUM CHLORIDE 0.9 % IV SOLN
2.0000 g | INTRAVENOUS | Status: DC
  Administered 2023-10-06 – 2023-10-07 (×2): 2 g via INTRAVENOUS
  Filled 2023-10-06 (×2): qty 20

## 2023-10-06 MED ORDER — OXYCODONE HCL 5 MG PO TABS
5.0000 mg | ORAL_TABLET | Freq: Four times a day (QID) | ORAL | Status: DC | PRN
  Administered 2023-10-06 – 2023-10-08 (×6): 5 mg via ORAL
  Filled 2023-10-06 (×7): qty 1

## 2023-10-06 MED ORDER — PANTOPRAZOLE SODIUM 40 MG PO TBEC
40.0000 mg | DELAYED_RELEASE_TABLET | Freq: Every day | ORAL | Status: DC
  Administered 2023-10-06 – 2023-10-08 (×3): 40 mg via ORAL
  Filled 2023-10-06 (×3): qty 1

## 2023-10-06 MED ORDER — LEVOFLOXACIN IN D5W 750 MG/150ML IV SOLN: 750.0000 mg | INTRAVENOUS | Status: DC

## 2023-10-06 MED ORDER — INSULIN GLARGINE 100 UNIT/ML ~~LOC~~ SOLN
15.0000 [IU] | Freq: Every day | SUBCUTANEOUS | Status: DC
  Administered 2023-10-06: 15 [IU] via SUBCUTANEOUS
  Filled 2023-10-06 (×2): qty 0.15

## 2023-10-06 MED ORDER — SODIUM CHLORIDE 0.9 % IV SOLN
INTRAVENOUS | Status: AC
Start: 2023-10-06 — End: 2023-10-06

## 2023-10-06 MED ORDER — OXYCODONE-ACETAMINOPHEN 5-325 MG PO TABS
1.0000 | ORAL_TABLET | Freq: Once | ORAL | Status: AC
  Administered 2023-10-06: 1 via ORAL
  Filled 2023-10-06: qty 1

## 2023-10-06 MED ORDER — IPRATROPIUM-ALBUTEROL 0.5-2.5 (3) MG/3ML IN SOLN
3.0000 mL | Freq: Four times a day (QID) | RESPIRATORY_TRACT | Status: DC
  Administered 2023-10-06 – 2023-10-07 (×7): 3 mL via RESPIRATORY_TRACT
  Filled 2023-10-06 (×7): qty 3

## 2023-10-06 MED ORDER — MONTELUKAST SODIUM 10 MG PO TABS
10.0000 mg | ORAL_TABLET | Freq: Every day | ORAL | Status: DC
  Administered 2023-10-06 – 2023-10-07 (×2): 10 mg via ORAL
  Filled 2023-10-06 (×3): qty 1

## 2023-10-06 MED ORDER — INSULIN ASPART 100 UNIT/ML IJ SOLN
0.0000 [IU] | Freq: Three times a day (TID) | INTRAMUSCULAR | Status: DC
  Administered 2023-10-06 (×2): 2 [IU] via SUBCUTANEOUS
  Administered 2023-10-06: 5 [IU] via SUBCUTANEOUS
  Administered 2023-10-06: 15 [IU] via SUBCUTANEOUS
  Administered 2023-10-07: 3 [IU] via SUBCUTANEOUS
  Administered 2023-10-07: 8 [IU] via SUBCUTANEOUS

## 2023-10-06 NOTE — Progress Notes (Signed)
   10/06/23 2238  BiPAP/CPAP/SIPAP  BiPAP/CPAP/SIPAP Pt Type Adult  Reason BIPAP/CPAP not in use Non-compliant (Patient states that she has never used a CPAP at home and does not want to try it. RT advised to call if she changes her mind.)  BiPAP/CPAP /SiPAP Vitals  Resp 20  MEWS Score/Color  MEWS Score 1  MEWS Score Color Green

## 2023-10-06 NOTE — ED Notes (Signed)
 BREO inhaler and handheld neb moved w/ Pt.

## 2023-10-06 NOTE — ED Notes (Signed)
 [facility] contacted to transport the patient to [facility].  RN made aware of transport update.  No ETA.

## 2023-10-06 NOTE — ED Notes (Signed)
 CCMD called to place patient on monitor

## 2023-10-06 NOTE — Plan of Care (Signed)
  Problem: Coping: Goal: Ability to adjust to condition or change in health will improve Outcome: Progressing   Problem: Fluid Volume: Goal: Ability to maintain a balanced intake and output will improve Outcome: Progressing   Problem: Tissue Perfusion: Goal: Adequacy of tissue perfusion will improve Outcome: Progressing

## 2023-10-06 NOTE — ED Notes (Signed)
 Pt refused nasal swab

## 2023-10-06 NOTE — H&P (Addendum)
 History and Physical    Sara [name] FMW:[id number] DOB: 1975-05-10 DOA: 10/05/2023  DOS: the patient was seen and examined on 10/05/2023  PCP: Lenon Homer, MD   Patient coming from: Shelter  I have personally briefly reviewed patient's old medical records in [facility] and CareEverywhere  HPI:   Sara [name] is a 48 y.o. year old female with past medical history of HTN, HLD, DM, OSA, tobacco use, GERD, Chronic pain, Depression, Type II diabetes mellitus, Pollen allergies, Sickle cell trait (HCC), Sleep apnea, and Vitamin D deficiency. Sara [name] presented to [facility] ED with dyspnea and chest heaviness with associated chest congestion and productive cough that began 2 weeks ago. Of note she was hospitalized for the same last week at [facility] and left AMA. Records not available in Care Everywhere, per patient's report she was being diuresed for what sounds like a CHF exacerbation diagnosis.  She called 911 last night due to a goal episode of hemoptysis. She denies any known recent sick contacts though she lives in a shelter.  She endorses bilateral lower extremity edema that she first noticed 2 weeks ago that resolves with elevation of her feet.  She also endorses a 15 to 20 pound weight gain over the last 2 to 3 months that she attributes to dietary indiscretion.  Lastly, she endorses medication noncompliance secondary to social issues with affordability of medication and her ability to actually get the medication.  She denies palpitations, nausea, vomiting, diarrhea, weakness.  ED Course: On arrival to [facility] ED patient was noted to be afebrile temp 36.9 C, BP 139/75, HR 109, RR 16, SpO2 98% on room air.  CXR obtained and shows bilateral perihilar interstitial opacities and CT angio PE obtained shows small airway infection/inflammation and no pulmonary embolism.  Labs notable for UA with glycosuria and proteinuria, mildly elevated BNP at 147.5, CO2 19, glucose 334,  BUN 32, creatinine 1.81, WBC 15.2, hemoglobin 8.5, troponin 24-->24, albumin 2.4.  She was given a 500 mL NS bolus, started on normal saline at 75 mL an hour, given, Zofran , and Percocet.  TRH contacted for admission.   Review of Systems: As mentioned in the history of present illness. All other systems reviewed and are negative.  Review of Systems  Constitutional:  Positive for malaise/fatigue. Negative for chills, fever and weight loss.  HENT:  Positive for congestion. Negative for nosebleeds, sinus pain and sore throat.   Respiratory:  Positive for cough, hemoptysis, sputum production and shortness of breath. Negative for wheezing.   Cardiovascular:  Positive for chest pain, palpitations and leg swelling.  Gastrointestinal:  Positive for heartburn. Negative for abdominal pain, blood in stool, constipation, diarrhea, melena, nausea and vomiting.  Musculoskeletal:  Positive for back pain. Negative for falls.  Neurological:  Negative for dizziness, focal weakness, weakness and headaches.  Psychiatric/Behavioral:  Negative for substance abuse.   All other systems reviewed and are negative.   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 pylori infection    High risk medication use    Hypercholesterolemia    Hyperlipidemia, mixed    Hypertension    Other mixed anxiety disorders    Pollen allergies    Sickle cell trait (HCC)    Sleep  apnea    Vitamin D deficiency     Past Surgical History:  Procedure Laterality Date   ABDOMINAL HYSTERECTOMY     APPENDECTOMY     COLONOSCOPY  08-16-2008   Dr. Princella   ESOPHAGOGASTRODUODENOSCOPY  08-16-2008   Dr. Anette      reports that she has been smoking cigarettes. She has never used smokeless tobacco. She reports that she does not currently use alcohol. She reports that  she does not currently use drugs after having used the following drugs: Marijuana and Cocaine.  Allergies  Allergen Reactions   Clarithromycin  Itching and Rash   Doxycycline Anaphylaxis and Rash   Penicillins Anaphylaxis and Rash    Received ceftriaxone  in 2022- pt doesn't recall issues. Ok with receiving CTX   Tetracyclines & Related Anaphylaxis   Metronidazole  Itching and Rash   Red Dye #40 (Allura Red) Itching    Likely red dye allergy - rx with Tylenol  elixir, can tolerate tablets    Family History  Problem Relation Age of Onset   Clotting disorder Mother    Crohn's disease Mother    Heart disease Mother    Colon cancer Father    Clotting disorder Brother    Diabetes Brother    Diabetes Maternal Aunt    Liver cancer Maternal Uncle    Prostate cancer Maternal Uncle    Colon cancer Maternal Uncle    Diabetes Paternal Aunt    Esophageal cancer Paternal Uncle     Prior to Admission medications   Medication Sig Start Date End Date Taking? Authorizing Provider  acetaminophen  (TYLENOL ) 500 MG tablet Take 1,000 mg by mouth every 8 (eight) hours as needed for moderate pain (pain score 4-6).    [provider]  ARIPiprazole  (ABILIFY ) 10 MG tablet Take 1 tablet (10 mg total) by mouth daily. Patient not taking: Reported on 01/09/2023 10/22/22   Raye Ellouise BROCKS, FNP  aspirin  EC 81 MG tablet Take 1 tablet (81 mg total) by mouth daily. Swallow whole. 01/12/23   Remi Pippin, NP  atorvastatin  (LIPITOR) 40 MG tablet Take 1 tablet (40 mg total) by mouth daily. 01/12/23   Remi Pippin, NP  dapagliflozin propanediol (FARXIGA) 5 MG TABS tablet Take 5 mg by mouth daily. Patient stated they are suppose to be sending medication. 01/09/2023. Patient not taking: Reported on 04/08/2023 08/07/22   [provider]  FLUoxetine  (PROZAC ) 20 MG capsule Take 1 capsule (20 mg total) by mouth daily. Patient not taking: Reported on 01/09/2023 10/21/22 11/20/22  Raye Ellouise BROCKS, FNP  gabapentin   (NEURONTIN ) 300 MG capsule Take 2 capsules (600 mg total) by mouth 3 (three) times daily. 10/21/22 04/08/23  Ntuen, Tina C, FNP  hydrOXYzine  (ATARAX ) 25 MG tablet Take 1 tablet (25 mg total) by mouth every 6 (six) hours as needed for anxiety. Patient not taking: Reported on 01/09/2023 10/21/22   Ntuen, Tina C, FNP  insulin  aspart (NOVOLOG ) 100 UNIT/ML injection Inject 0-15 Units into the skin 3 (three) times daily with meals. 10/21/22   Ntuen, Tina C, FNP  insulin  aspart (NOVOLOG ) 100 UNIT/ML injection Inject 0-5 Units into the skin at bedtime. 10/21/22   Ntuen, Tina C, FNP  insulin  glargine-yfgn (SEMGLEE ) 100 UNIT/ML injection Inject 0.16 mLs (16 Units total) into the skin at bedtime. Patient not taking: Reported on 01/09/2023 10/21/22   Ntuen, Tina C, FNP  linaclotide  (LINZESS ) 145 MCG CAPS capsule Take 1 capsule (145 mcg total) by mouth daily before breakfast. Patient not taking: Reported on 01/09/2023 10/22/22  Ntuen, Tina C, FNP  metoprolol  succinate (TOPROL -XL) 25 MG 24 hr tablet Take 0.5 tablets (12.5 mg total) by mouth daily. 10/22/22   Ntuen, Tina C, FNP  nicotine  (NICODERM CQ  - DOSED IN MG/24 HOURS) 21 mg/24hr patch Place 1 patch (21 mg total) onto the skin daily. Patient not taking: Reported on 04/08/2023 01/12/23   Remi Pippin, NP  nicotine  polacrilex (NICORETTE ) 2 MG gum Take 1 each (2 mg total) by mouth as needed for smoking cessation. Patient not taking: Reported on 01/09/2023 10/21/22   Ntuen, Tina C, FNP  omeprazole  (PRILOSEC) 40 MG capsule Take 40 mg by mouth daily.    [provider]  ondansetron  (ZOFRAN -ODT) 4 MG disintegrating tablet Take 1 tablet (4 mg total) by mouth every 8 (eight) hours as needed for nausea or vomiting. 04/08/23   Minnie Tinnie BRAVO, PA  oxyCODONE -acetaminophen  (PERCOCET/ROXICET) 5-325 MG tablet Take 1 tablet by mouth every 8 (eight) hours as needed for moderate pain. 10/21/22   Raye Ellouise BROCKS, FNP    Physical Exam: Vitals:   10/06/23 0245 10/06/23 0500  10/06/23 0548 10/06/23 0630  BP:  134/69  (!) 154/76  Pulse: 100 (!) 102  99  Resp: 14 14  19   Temp:   98.2 F (36.8 C)   TempSrc:   Oral   SpO2: 96% 98%  98%    Physical Exam Vitals and nursing note reviewed.  Constitutional:      Appearance: She is well-developed.  HENT:     Head: Normocephalic.  Eyes:     Pupils: Pupils are equal, round, and reactive to light.  Cardiovascular:     Rate and Rhythm: Normal rate and regular rhythm.  Pulmonary:     Effort: Pulmonary effort is normal.     Breath sounds: No wheezing, rhonchi or rales.  Abdominal:     General: Bowel sounds are normal.     Palpations: Abdomen is soft.  Musculoskeletal:     Cervical back: Normal range of motion.  Skin:    General: Skin is warm and dry.     Capillary Refill: Capillary refill takes less than 2 seconds.  Neurological:     General: No focal deficit present.     Mental Status: She is alert and oriented to person, place, and time.  Psychiatric:        Mood and Affect: Mood normal.        Behavior: Behavior normal.      Labs on Admission: I have personally reviewed following labs and imaging studies  CBC: Recent Labs  Lab 10/05/23 1809 10/05/23 1811  WBC 15.2*  --   HGB 8.5* 10.9*  HCT 26.9* 32.0*  MCV 87.6  --   PLT 253  --    Basic Metabolic Panel: Recent Labs  Lab 10/05/23 1809 10/05/23 1811  NA 136 138  K 4.6 4.7  CL 105 110  CO2 19*  --   GLUCOSE 334* 335*  BUN 32* 30*  CREATININE 1.81* 1.90*  CALCIUM  8.7*  --    GFR: CrCl cannot be calculated (Unknown ideal weight.). Liver Function Tests: Recent Labs  Lab 10/05/23 2010  AST 18  ALT 15  ALKPHOS 61  BILITOT 0.4  PROT 5.5*  ALBUMIN 2.4*   No results for input(s): LIPASE, AMYLASE in the last 168 hours. No results for input(s): AMMONIA in the last 168 hours. Coagulation Profile: No results for input(s): INR, PROTIME in the last 168 hours. Cardiac Enzymes: Recent Labs  Lab 10/05/23 1809  10/05/23 2010  TROPONINIHS 24* 24*   BNP (last 3 results) Recent Labs    10/05/23 1800  BNP 147.5*   HbA1C: Recent Labs    10/06/23 0302  HGBA1C 8.6*   CBG: Recent Labs  Lab 10/05/23 1707 10/06/23 0300 10/06/23 0415  GLUCAP 333* 361* 251*   Lipid Profile: No results for input(s): CHOL, HDL, LDLCALC, TRIG, CHOLHDL, LDLDIRECT in the last 72 hours. Thyroid Function Tests: No results for input(s): TSH, T4TOTAL, FREET4, T3FREE, THYROIDAB in the last 72 hours. Anemia Panel: No results for input(s): VITAMINB12, FOLATE, FERRITIN, TIBC, IRON, RETICCTPCT in the last 72 hours. Urine analysis:    Component Value Date/Time   COLORURINE YELLOW 10/05/2023 1715   APPEARANCEUR CLEAR 10/05/2023 1715   LABSPEC 1.012 10/05/2023 1715   PHURINE 6.0 10/05/2023 1715   GLUCOSEU >=500 (A) 10/05/2023 1715   HGBUR MODERATE (A) 10/05/2023 1715   BILIRUBINUR NEGATIVE 10/05/2023 1715   KETONESUR NEGATIVE 10/05/2023 1715   PROTEINUR >=300 (A) 10/05/2023 1715   UROBILINOGEN 0.2 12/11/2009 1249   NITRITE NEGATIVE 10/05/2023 1715   LEUKOCYTESUR NEGATIVE 10/05/2023 1715    Radiological Exams on Admission: I have personally reviewed images CT Angio Chest PE W and/or Wo Contrast Result Date: 10/06/2023 EXAM: CTA of the Chest with contrast for PE 10/06/2023 01:44:28 AM TECHNIQUE: CTA of the chest was performed after the administration of intravenous contrast. Multiplanar reformatted images are provided for review. MIP images are provided for review. Automated exposure control, iterative reconstruction, and/or weight based adjustment of the mA/kV was utilized to reduce the radiation dose to as low as reasonably achievable. COMPARISON: X-ray 10/05/2023 and CT chest 06/14/2023. CLINICAL HISTORY: Hemoptysis. Pt complains of shortness of breath and chest pain. History of CHF, diabetes, asthma. Recently admitted in [facility] [place] for CHF exacerbation. FINDINGS: PULMONARY  ARTERIES: Pulmonary arteries are adequately opacified for evaluation. No pulmonary embolism. Main pulmonary artery is normal in caliber. MEDIASTINUM: The heart and pericardium demonstrate no acute abnormality. There is no acute abnormality of the thoracic aorta. Normal caliber thoracic aorta without dissection. Aortic atherosclerotic calcification. No pericardial effusion. LYMPH NODES: No mediastinal, hilar or axillary lymphadenopathy. LUNGS AND PLEURA: Diffuse centrilobular micronodules and ground-glass nodules greatest in the upper lobes. Findings are compatible with small airway infection/inflammation. No pleural effusion or pneumothorax. UPPER ABDOMEN: Limited images of the upper abdomen are unremarkable. SOFT TISSUES AND BONES: No acute bone or soft tissue abnormality. IMPRESSION: 1. No pulmonary embolism. 2. Small airway infection/inflammation with an upper lobe predominance . Electronically signed by: Norman Gatlin MD 10/06/2023 01:57 AM EDT RP Workstation: [id number]   DG Chest 2 View Result Date: 10/05/2023 CLINICAL DATA:  CP SOB EXAM: CHEST - 2 VIEW COMPARISON:  None available. FINDINGS: Diffuse interstitial opacities throughout both lungs. No focal airspace consolidation, pleural effusion, or pneumothorax. No cardiomegaly.No acute fracture or destructive lesion. IMPRESSION: Bilateral perihilar interstitial opacities, which may represent interstitial edema or atypical/viral infection, in the correct clinical context. Electronically Signed   By: Rogelia Myers M.D.   On: 10/05/2023 17:56    EKG: My personal interpretation of EKG shows: Sinus tachycardia HR 110   Assessment/Plan Principal Problem:   Acute bronchitis   #Acute Bronchitis #Community Acquired Pneumonia Residual edema seen on CXR which could be edema or infection.  With leukocytosis and productive cough as well as being housed in a shelter will cover for CAP. - IV Rocephin  - Check COVID, Flu, RSV - Duonebs - Mucinex  -  Supplemental O2 as needed - Pulmonary toilet: Mobilize/incentive spirometry/flutter  valve  - Urine studies: Legionella, Strep Antigen  #Acute Kidney Injury - Gentle IV fluid hydration, monitor respiratory status closely - Hold home lisinopril  - Avoid nephrotoxins, contrast Dyes, Hypotension and Dehydration t - Repeat metabolic panel with a.m. labs  #Hemoptysis #Normocytic Anemia CTA PE negative - Check Iron Panel, B12, folate - Repeat CBC in 8 hrs - Hold chemical VTE prophylaxis  Chronic medical issues: #Type II Diabetes Mellitus with Hyperglycemia - Continue home Lantus  15U daily - AC/HS CBG monitoring and SSI   #HFpEF ECHO from December 2024 with LVEF 55 to 60% and grade 1 diastolic dysfunction.  - Continue home metoprolol   - Hold home lisinopril   #Hypertension Soft blood pressure documented at 0715A 94/53, I was in the room shortly thereafter and this blood pressure was positional patient was lying on her left side and blood pressure cuff was on her right arm.  Blood pressure recycled with the patient on her back with SBP in the 120s - Continue home metoprolol   - Hold home lisinopril   #Hyperlipidemia - Continue home atorvastatin   #OSA  - PRN qHS CPAP  #Tobacco use  - Patient counseled on importance of continued cessation - Nicotine  replacement therapy while inpatient with nicotine  patch and as needed nicotine  gum   #GERD - Protonix   VTE prophylaxis:  SCDs  GI prophylaxis: Protonix  Diet: Heart Healthy/Carb Modified Access: PIV Lines: None Code Status:  Full Code Telemetry: Yes  Admission status: Inpatient, Telemetry bed Patient is from: Shelter  Anticipated d/c is to: Previous Environment  Anticipated d/c date is: 3-4 days Patient currently: Receiving IV antibiotics, IV fluids, and pending symptomatic improvement  Family Communication: No family at bedside. Plan of care discussed with patient. Questions welcomed and elicited. Questions answered to patients  expressed satisfaction.   Consults called: N/A    Severity of Illness: The appropriate patient status for this patient is INPATIENT. Inpatient status is judged to be reasonable and necessary in order to provide the required intensity of service to ensure the patient's safety. The patient's presenting symptoms, physical exam findings, and initial radiographic and laboratory data in the context of their chronic comorbidities is felt to place them at high risk for further clinical deterioration. Furthermore, it is not anticipated that the patient will be medically stable for discharge from the hospital within 2 midnights of admission.   * I certify that at the point of admission it is my clinical judgment that the patient will require inpatient hospital care spanning beyond 2 midnights from the point of admission due to high intensity of service, high risk for further deterioration and high frequency of surveillance required.*  To reach the provider On-Call:   7AM- 7PM see care teams to locate the attending and reach out to them via [email or web address] Password: [email or web address] 7PM-7AM contact night-coverage If you still have difficulty reaching the appropriate provider, please page the [facility] (Director on Call) for [facility] on amion for assistance  This document was prepared using Conservation officer, historic buildings and may include unintentional dictation errors.  Rockie Rams FNP-BC, PMHNP-BC Nurse Practitioner [facility]

## 2023-10-06 NOTE — ED Notes (Signed)
 [facility] at bedside to transfer to [facility].

## 2023-10-07 DIAGNOSIS — J209 Acute bronchitis, unspecified: Secondary | ICD-10-CM | POA: Diagnosis not present

## 2023-10-07 DIAGNOSIS — F17200 Nicotine dependence, unspecified, uncomplicated: Secondary | ICD-10-CM

## 2023-10-07 DIAGNOSIS — N179 Acute kidney failure, unspecified: Secondary | ICD-10-CM | POA: Diagnosis not present

## 2023-10-07 DIAGNOSIS — J189 Pneumonia, unspecified organism: Secondary | ICD-10-CM | POA: Diagnosis not present

## 2023-10-07 LAB — CBC
HCT: 26.2 % — ABNORMAL LOW (ref 36.0–46.0)
Hemoglobin: 8.1 g/dL — ABNORMAL LOW (ref 12.0–15.0)
MCH: 26.7 pg (ref 26.0–34.0)
MCHC: 30.9 g/dL (ref 30.0–36.0)
MCV: 86.5 fL (ref 80.0–100.0)
Platelets: 159 K/uL (ref 150–400)
RBC: 3.03 MIL/uL — ABNORMAL LOW (ref 3.87–5.11)
RDW: 13.4 % (ref 11.5–15.5)
WBC: 11.2 K/uL — ABNORMAL HIGH (ref 4.0–10.5)
nRBC: 0 % (ref 0.0–0.2)

## 2023-10-07 LAB — BASIC METABOLIC PANEL WITH GFR
Anion gap: 10 (ref 5–15)
BUN: 28 mg/dL — ABNORMAL HIGH (ref 6–20)
CO2: 18 mmol/L — ABNORMAL LOW (ref 22–32)
Calcium: 8.2 mg/dL — ABNORMAL LOW (ref 8.9–10.3)
Chloride: 108 mmol/L (ref 98–111)
Creatinine, Ser: 1.8 mg/dL — ABNORMAL HIGH (ref 0.44–1.00)
GFR, Estimated: 34 mL/min — ABNORMAL LOW (ref >60)
Glucose, Bld: 187 mg/dL — ABNORMAL HIGH (ref 70–99)
Potassium: 4.2 mmol/L (ref 3.5–5.1)
Sodium: 136 mmol/L (ref 135–145)

## 2023-10-07 LAB — GLUCOSE, CAPILLARY
Glucose-Capillary: 192 mg/dL — ABNORMAL HIGH (ref 70–99)
Glucose-Capillary: 256 mg/dL — ABNORMAL HIGH (ref 70–99)
Glucose-Capillary: 100 mg/dL — ABNORMAL HIGH (ref 70–99)
Glucose-Capillary: 232 mg/dL — ABNORMAL HIGH (ref 70–99)

## 2023-10-07 LAB — STREP PNEUMONIAE URINARY ANTIGEN: Strep Pneumo Urinary Antigen: NEGATIVE

## 2023-10-07 MED ORDER — INSULIN ASPART 100 UNIT/ML IJ SOLN
0.0000 [IU] | Freq: Every day | INTRAMUSCULAR | Status: DC
  Administered 2023-10-07: 4 [IU] via SUBCUTANEOUS

## 2023-10-07 MED ORDER — LEVOFLOXACIN IN D5W 750 MG/150ML IV SOLN
750.0000 mg | INTRAVENOUS | Status: DC
  Administered 2023-10-07: 750 mg via INTRAVENOUS
  Filled 2023-10-07: qty 150

## 2023-10-07 MED ORDER — METHYLPREDNISOLONE SODIUM SUCC 40 MG IJ SOLR
40.0000 mg | Freq: Two times a day (BID) | INTRAMUSCULAR | Status: DC
  Administered 2023-10-07 – 2023-10-08 (×2): 40 mg via INTRAVENOUS
  Filled 2023-10-07 (×2): qty 1

## 2023-10-07 MED ORDER — INSULIN ASPART 100 UNIT/ML IJ SOLN
0.0000 [IU] | Freq: Three times a day (TID) | INTRAMUSCULAR | Status: DC
  Administered 2023-10-08: 7 [IU] via SUBCUTANEOUS
  Administered 2023-10-08: 3 [IU] via SUBCUTANEOUS
  Administered 2023-10-08: 11 [IU] via SUBCUTANEOUS

## 2023-10-07 MED ORDER — INSULIN GLARGINE 100 UNIT/ML ~~LOC~~ SOLN
18.0000 [IU] | Freq: Every day | SUBCUTANEOUS | Status: DC
  Administered 2023-10-07: 18 [IU] via SUBCUTANEOUS
  Filled 2023-10-07 (×2): qty 0.18

## 2023-10-07 MED ORDER — HEPARIN SODIUM (PORCINE) 5000 UNIT/ML IJ SOLN
5000.0000 [IU] | Freq: Three times a day (TID) | INTRAMUSCULAR | Status: DC
  Administered 2023-10-07 – 2023-10-08 (×3): 5000 [IU] via SUBCUTANEOUS
  Filled 2023-10-07 (×4): qty 1

## 2023-10-07 MED ORDER — DIPHENHYDRAMINE HCL 50 MG/ML IJ SOLN
25.0000 mg | Freq: Two times a day (BID) | INTRAMUSCULAR | Status: DC | PRN
  Administered 2023-10-07: 25 mg via INTRAVENOUS
  Filled 2023-10-07: qty 1

## 2023-10-07 MED ORDER — SODIUM CHLORIDE 0.9 % IV SOLN: INTRAVENOUS | Status: AC

## 2023-10-07 NOTE — Progress Notes (Addendum)
 PROGRESS NOTE  Sara [name], is a 48 y.o. female, DOB - 09-22-1975, FMW:[id number]  Admit date - 10/05/2023   Admitting Physician Aimee Somerset, MD  Outpatient Primary MD for the patient is Lenon Homer, MD  LOS - 1  Chief Complaint  Patient presents with   Cough   Shortness of Breath      Brief Narrative:  48 yo F PMH  HTN, HLD, DM2, OSA, tobacco use, GERD, Chronic pain, Depression, Type II diabetes mellitus, Pollen allergies, Sickle cell trait and Vitamin D deficiency admitted on 10/06/2023 with acute COPD exacerbation with concerns for bronchitis/CAP and AKI    -Assessment and Plan: 1)Acute  bronchitis/possible CAP--patient presented with hemoptysis, CTA chest without PE - Finding of infectious/inflammatory changes -COVID flu and RSV negative - Patient received IV Rocephin ,--complains of itching after Rocephin  patient had removed history of penicillin allergy - unable to use doxycycline or azithromycin  due to allergies -Strep pneumo antigen negative -Okay to switch to Levaquin  to complete treatment course - Mucolytics and bronchodilators as prescribed  2) acute COPD exacerbation--due to #1 above --- Okay to add Solu-Medrol  - Management as above #1  3)AKI----acute kidney injury -  creatinine on admission= 1.89 ,  baseline creatinine = 0.8 on 04/08/2023 and 0.6 on 01/10/2023  - creatinine is now= 1.8  , -Continue to hold home lisinopril  -Patient had contrast study on 10/06/2023 --Give IV fluid  renally adjust medications, avoid nephrotoxic agents / dehydration  / hypotension  4)HTN--continue to hold PTA lisinopril  due to #3 above, - Okay to continue metoprolol   5)OSA--CPAP use encouraged  6)GERD--continue Protonix  especially while on steroids  7)DM2-A1c is 8.6 reflecting uncontrolled diabetic with hyperglycemia PTA -- Anticipate follow-up worsening of hyperglycemia with steroid - Increase Lantus  to 18 units nightly inhaled corticosteroids   8)HLD--- continue  atorvastatin   9)HFpEF--- history of chronic diastolic dysfunction CHF - No acute exacerbation' ECHO from December 2024 with LVEF 55 to 60% and grade 1 diastolic dysfunction.  - Continue home metoprolol   - Hold home lisinopril   10)Social/Ethics--- patient is technically homeless, she lives in a homeless shelter -- [facility] consult to help with disposition   Status is: Inpatient   Disposition: The patient is from: Home--- homeless shelter              Anticipated d/c is to: Home--- homeless shelter              Anticipated d/c date is: 1 day              Patient currently is not medically stable to d/c. Barriers: Not Clinically Stable-   Code Status : -  Code Status: Full Code   Family Communication:   NA (patient is alert, awake and coherent)   DVT Prophylaxis  :   - SCDs \\Place  and maintain sequential compression device Start: 10/06/23 1018 Place TED hose Start: 10/06/23 0839   Lab Results  Component Value Date   PLT 159 10/07/2023    Inpatient Medications  Scheduled Meds:  atorvastatin   40 mg Oral QHS   fluticasone  furoate-vilanterol  1 puff Inhalation Daily   guaiFENesin   1,200 mg Oral BID   insulin  aspart  0-15 Units Subcutaneous TID WC   insulin  aspart  0-5 Units Subcutaneous QHS   insulin  glargine  15 Units Subcutaneous QHS   ipratropium-albuterol   3 mL Nebulization Q6H   metoprolol  succinate  12.5 mg Oral Daily   montelukast   10 mg Oral QHS   nicotine   14 mg Transdermal Daily  pantoprazole   40 mg Oral Daily   Continuous Infusions:  cefTRIAXone  (ROCEPHIN )  IV 2 g (10/07/23 0333)   PRN Meds:.acetaminophen  **OR** acetaminophen , albuterol , melatonin, ondansetron  **OR** ondansetron  (ZOFRAN ) IV, oxyCODONE , polyethylene glycol   Anti-infectives (From admission, onward)    Start     Dose/Rate Route Frequency Ordered Stop   10/06/23 0300  cefTRIAXone  (ROCEPHIN ) 2 g in sodium chloride  0.9 % 100 mL IVPB        2 g 200 mL/hr over 30 Minutes Intravenous Every 24 hours  10/06/23 0259     10/06/23 0245  levofloxacin  (LEVAQUIN ) IVPB 750 mg  Status:  Discontinued        750 mg 100 mL/hr over 90 Minutes Intravenous Every 48 hours 10/06/23 0239 10/06/23 0259         Subjective: Sara [name] today has no fevers, no emesis,  No chest pain,    - Cough and dyspnea improved  Objective: Vitals:   10/07/23 0611 10/07/23 0753 10/07/23 1334 10/07/23 1358  BP: (!) 160/92  (!) 169/87   Pulse: (!) 104  (!) 103   Resp: 16  20   Temp: 99 F (37.2 C)  98.6 F (37 C)   TempSrc: Oral  Oral   SpO2: 100% 100% 100% 99%  Weight:      Height:        Intake/Output Summary (Last 24 hours) at 10/07/2023 1628 Last data filed at 10/07/2023 1620 Gross per 24 hour  Intake 240 ml  Output 3200 ml  Net -2960 ml   Filed Weights   10/07/23 0516  Weight: 64.5 kg    Physical Exam  Gen:- Awake Alert, dyspnea on exertion but no dyspnea at rest HEENT:- [place].AT, No sclera icterus Neck-Supple Neck,No JVD,.  Lungs-improving air movement, no significant wheezing CV- S1, S2 normal, regular  Abd-  +ve B.Sounds, Abd Soft, No tenderness,    Extremity/Skin:- No  edema, pedal pulses present  Psych-affect is appropriate, oriented x3 Neuro-no new focal deficits, no tremors  Data Reviewed: I have personally reviewed following labs and imaging studies  CBC: Recent Labs  Lab 10/05/23 1809 10/05/23 1811 10/06/23 0859 10/06/23 1615 10/07/23 0437  WBC 15.2*  --  10.8* 10.9* 11.2*  HGB 8.5* 10.9* 8.6* 8.7* 8.1*  HCT 26.9* 32.0* 26.8* 27.7* 26.2*  MCV 87.6  --  85.6 86.8 86.5  PLT 253  --  178 175 159   Basic Metabolic Panel: Recent Labs  Lab 10/05/23 1809 10/05/23 1811 10/06/23 0859 10/07/23 0437  NA 136 138 140 136  K 4.6 4.7 4.3 4.2  CL 105 110 112* 108  CO2 19*  --  20* 18*  GLUCOSE 334* 335* 125* 187*  BUN 32* 30* 28* 28*  CREATININE 1.81* 1.90* 1.89* 1.80*  CALCIUM  8.7*  --  8.2* 8.2*  MG  --   --  2.0  --   PHOS  --   --  3.9  --    GFR: Estimated  Creatinine Clearance: 38.6 mL/min (A) (by C-G formula based on SCr of 1.8 mg/dL (H)). Liver Function Tests: Recent Labs  Lab 10/05/23 2010 10/06/23 0859  AST 18 16  ALT 15 14  ALKPHOS 61 57  BILITOT 0.4 0.4  PROT 5.5* 5.3*  ALBUMIN 2.4* 2.2*   HbA1C: Recent Labs    10/06/23 0302  HGBA1C 8.6*   Recent Results (from the past 240 hours)  Resp panel by RT-PCR (RSV, Flu A&B, Covid) Anterior Nasal Swab     Status: None  Collection Time: 10/06/23  5:22 PM   Specimen: Anterior Nasal Swab  Result Value Ref Range Status   SARS Coronavirus 2 by RT PCR NEGATIVE NEGATIVE Final    Comment: (NOTE) SARS-CoV-2 target nucleic acids are NOT DETECTED.  The SARS-CoV-2 RNA is generally detectable in upper respiratory specimens during the acute phase of infection. The lowest concentration of SARS-CoV-2 viral copies this assay can detect is 138 copies/mL. A negative result does not preclude SARS-Cov-2 infection and should not be used as the sole basis for treatment or other patient management decisions. A negative result may occur with  improper specimen collection/handling, submission of specimen other than nasopharyngeal swab, presence of viral mutation(s) within the areas targeted by this assay, and inadequate number of viral copies(<138 copies/mL). A negative result must be combined with clinical observations, patient history, and epidemiological information. The expected result is Negative.  Fact Sheet for Patients:  BloggerCourse.com  Fact Sheet for Healthcare Providers:  SeriousBroker.it  This test is no t yet approved or cleared by the [place]  FDA and  has been authorized for detection and/or diagnosis of SARS-CoV-2 by FDA under an Emergency Use Authorization (EUA). This EUA will remain  in effect (meaning this test can be used) for the duration of the COVID-19 declaration under Section 564(b)(1) of the Act, 21 U.S.C.section  360bbb-3(b)(1), unless the authorization is terminated  or revoked sooner.       Influenza A by PCR NEGATIVE NEGATIVE Final   Influenza B by PCR NEGATIVE NEGATIVE Final    Comment: (NOTE) The Xpert Xpress SARS-CoV-2/FLU/RSV plus assay is intended as an aid in the diagnosis of influenza from Nasopharyngeal swab specimens and should not be used as a sole basis for treatment. Nasal washings and aspirates are unacceptable for Xpert Xpress SARS-CoV-2/FLU/RSV testing.  Fact Sheet for Patients: BloggerCourse.com  Fact Sheet for Healthcare Providers: SeriousBroker.it  This test is not yet approved or cleared by the [place]  FDA and has been authorized for detection and/or diagnosis of SARS-CoV-2 by FDA under an Emergency Use Authorization (EUA). This EUA will remain in effect (meaning this test can be used) for the duration of the COVID-19 declaration under Section 564(b)(1) of the Act, 21 U.S.C. section 360bbb-3(b)(1), unless the authorization is terminated or revoked.     Resp Syncytial Virus by PCR NEGATIVE NEGATIVE Final    Comment: (NOTE) Fact Sheet for Patients: BloggerCourse.com  Fact Sheet for Healthcare Providers: SeriousBroker.it  This test is not yet approved or cleared by the [place]  FDA and has been authorized for detection and/or diagnosis of SARS-CoV-2 by FDA under an Emergency Use Authorization (EUA). This EUA will remain in effect (meaning this test can be used) for the duration of the COVID-19 declaration under Section 564(b)(1) of the Act, 21 U.S.C. section 360bbb-3(b)(1), unless the authorization is terminated or revoked.  Performed at [facility], [address] [place], [place] [address]     Radiology Studies: CT Angio Chest PE W and/or Wo Contrast Result Date: 10/06/2023 EXAM: CTA of the Chest with contrast for PE 10/06/2023  01:44:28 AM TECHNIQUE: CTA of the chest was performed after the administration of intravenous contrast. Multiplanar reformatted images are provided for review. MIP images are provided for review. Automated exposure control, iterative reconstruction, and/or weight based adjustment of the mA/kV was utilized to reduce the radiation dose to as low as reasonably achievable. COMPARISON: X-ray 10/05/2023 and CT chest 06/14/2023. CLINICAL HISTORY: Hemoptysis. Pt complains of shortness of breath and chest pain. History  of CHF, diabetes, asthma. [facility] admitted in [facility] for CHF exacerbation. FINDINGS: PULMONARY ARTERIES: Pulmonary arteries are adequately opacified for evaluation. No pulmonary embolism. Main pulmonary artery is normal in caliber. MEDIASTINUM: The heart and pericardium demonstrate no acute abnormality. There is no acute abnormality of the thoracic aorta. Normal caliber thoracic aorta without dissection. Aortic atherosclerotic calcification. No pericardial effusion. LYMPH NODES: No mediastinal, hilar or axillary lymphadenopathy. LUNGS AND PLEURA: Diffuse centrilobular micronodules and ground-glass nodules greatest in the upper lobes. Findings are compatible with small airway infection/inflammation. No pleural effusion or pneumothorax. UPPER ABDOMEN: Limited images of the upper abdomen are unremarkable. SOFT TISSUES AND BONES: No acute bone or soft tissue abnormality. IMPRESSION: 1. No pulmonary embolism. 2. Small airway infection/inflammation with an upper lobe predominance . Electronically signed by: Norman Gatlin MD 10/06/2023 01:57 AM EDT RP Workstation: [id number]   DG Chest 2 View Result Date: 10/05/2023 CLINICAL DATA:  CP SOB EXAM: CHEST - 2 VIEW COMPARISON:  None available. FINDINGS: Diffuse interstitial opacities throughout both lungs. No focal airspace consolidation, pleural effusion, or pneumothorax. No cardiomegaly.No acute fracture or destructive lesion. IMPRESSION: Bilateral perihilar  interstitial opacities, which may represent interstitial edema or atypical/viral infection, in the correct clinical context. Electronically Signed   By: Rogelia Myers M.D.   On: 10/05/2023 17:56   Scheduled Meds:  atorvastatin   40 mg Oral QHS   fluticasone  furoate-vilanterol  1 puff Inhalation Daily   guaiFENesin   1,200 mg Oral BID   insulin  aspart  0-15 Units Subcutaneous TID WC   insulin  aspart  0-5 Units Subcutaneous QHS   insulin  glargine  15 Units Subcutaneous QHS   ipratropium-albuterol   3 mL Nebulization Q6H   metoprolol  succinate  12.5 mg Oral Daily   montelukast   10 mg Oral QHS   nicotine   14 mg Transdermal Daily   pantoprazole   40 mg Oral Daily   Continuous Infusions:  cefTRIAXone  (ROCEPHIN )  IV 2 g (10/07/23 0333)    LOS: 1 day   Rendall Carwin M.D on 10/07/2023 at 4:28 PM  Go to [email or web address] - for contact info  [facility] - Office  [phone]  If 7PM-7AM, please contact night-coverage [email or web address] 10/07/2023, 4:28 PM

## 2023-10-07 NOTE — Progress Notes (Signed)
 Pt had dose of Rocephin  as ordered, at end of infusion pt called writer to room to c/o itching and nausea. Flushed IV line with addition of 30 ml of NS and zofran  given for nausea. Pt stated relief after addition IV fluids. Updated day shift RN of findings. SRP, RN

## 2023-10-08 DIAGNOSIS — J209 Acute bronchitis, unspecified: Secondary | ICD-10-CM | POA: Diagnosis not present

## 2023-10-08 LAB — RENAL FUNCTION PANEL
Albumin: 3 g/dL — ABNORMAL LOW (ref 3.5–5.0)
Anion gap: 10 (ref 5–15)
BUN: 27 mg/dL — ABNORMAL HIGH (ref 6–20)
CO2: 19 mmol/L — ABNORMAL LOW (ref 22–32)
Calcium: 8.7 mg/dL — ABNORMAL LOW (ref 8.9–10.3)
Chloride: 107 mmol/L (ref 98–111)
Creatinine, Ser: 1.79 mg/dL — ABNORMAL HIGH (ref 0.44–1.00)
GFR, Estimated: 34 mL/min — ABNORMAL LOW (ref >60)
Glucose, Bld: 315 mg/dL — ABNORMAL HIGH (ref 70–99)
Phosphorus: 3.4 mg/dL (ref 2.5–4.6)
Potassium: 5.3 mmol/L — ABNORMAL HIGH (ref 3.5–5.1)
Sodium: 135 mmol/L (ref 135–145)

## 2023-10-08 LAB — GLUCOSE, CAPILLARY
Glucose-Capillary: 230 mg/dL — ABNORMAL HIGH (ref 70–99)
Glucose-Capillary: 251 mg/dL — ABNORMAL HIGH (ref 70–99)
Glucose-Capillary: 248 mg/dL — ABNORMAL HIGH (ref 70–99)
Glucose-Capillary: 133 mg/dL — ABNORMAL HIGH (ref 70–99)
Glucose-Capillary: 110 mg/dL — ABNORMAL HIGH (ref 70–99)

## 2023-10-08 MED ORDER — METHYLPREDNISOLONE SODIUM SUCC 40 MG IJ SOLR: 40.0000 mg | Freq: Every day | INTRAMUSCULAR | Status: DC

## 2023-10-08 MED ORDER — IPRATROPIUM-ALBUTEROL 0.5-2.5 (3) MG/3ML IN SOLN
3.0000 mL | Freq: Four times a day (QID) | RESPIRATORY_TRACT | Status: DC
  Administered 2023-10-08 (×3): 3 mL via RESPIRATORY_TRACT
  Filled 2023-10-08 (×5): qty 3

## 2023-10-08 MED ORDER — INSULIN GLARGINE 100 UNIT/ML ~~LOC~~ SOLN
20.0000 [IU] | Freq: Every day | SUBCUTANEOUS | Status: DC
  Filled 2023-10-08 (×2): qty 0.2

## 2023-10-08 MED ORDER — SODIUM CHLORIDE 0.45 % IV SOLN
INTRAVENOUS | Status: AC
  Filled 2023-10-08: qty 75

## 2023-10-08 MED ORDER — HYDRALAZINE HCL 20 MG/ML IJ SOLN: 10.0000 mg | Freq: Four times a day (QID) | INTRAMUSCULAR | Status: DC | PRN

## 2023-10-08 MED ORDER — AMLODIPINE BESYLATE 10 MG PO TABS
10.0000 mg | ORAL_TABLET | Freq: Every day | ORAL | Status: DC
  Administered 2023-10-08: 10 mg via ORAL
  Filled 2023-10-08: qty 1

## 2023-10-08 NOTE — Progress Notes (Signed)
  Progress Note   Patient: Sara [name] FMW:[id number] DOB: [birth year] DOA: 10/05/2023     2 DOS: the patient was seen and examined on 10/08/2023   Brief hospital course:  48 yo F PMH  HTN, HLD, DM2, OSA, tobacco use, GERD, Chronic pain, Depression, Type II diabetes mellitus, Pollen allergies, Sickle cell trait and Vitamin D deficiency admitted on 10/06/2023 with acute COPD exacerbation with concerns for bronchitis/CAP and AKI     Assessment and Plan:  1)Acute  bronchitis/possible CAP--patient presented with hemoptysis, CTA chest without PE - Finding of infectious/inflammatory changes -COVID flu and RSV negative - Patient received IV Rocephin ,--complains of itching after Rocephin  patient had removed history of penicillin allergy - unable to use doxycycline or azithromycin  due to allergies -Strep pneumo antigen negative - Continue Levaquin  to complete treatment course - Mucolytics and bronchodilators as prescribed   2) Acute COPD exacerbation Improved Will discontinue systemic steroids   3)AKI----acute kidney injury -  creatinine on admission= 1.89 ,  baseline creatinine = 0.8 on 04/08/2023 and 0.6 on 01/10/2023  - creatinine is now= 1.79 with mild metabolic acidosis and mild hyperkalemia, -Continue to hold home lisinopril  -Patient had contrast study on 10/06/2023 -- Administer bicarbonate infusion and repeat renal parameters  renally adjust medications, avoid nephrotoxic agents / dehydration  / hypotension    4)HTN Uncontrolled Start patient on amlodipine  10 mg daily Continue metoprolol    5)OSA CPAP use encouraged   6)GERD-- Continue Protonix  especially while on steroids    7)DM2-A1c is 8.6 reflecting uncontrolled diabetes with hyperglycemia PTA -- Anticipate follow-up worsening of hyperglycemia with steroid - Increase Lantus  to 20 units nightly inhaled corticosteroids    8)HLD--- continue atorvastatin    9)HFpEF--- history of chronic diastolic dysfunction CHF - No  acute exacerbation' ECHO from December 2024 with LVEF 55 to 60% and grade 1 diastolic dysfunction.  - Continue metoprolol   - Hold home lisinopril    10)Social/Ethics--- patient is technically homeless, she lives in a homeless shelter -- [facility] consult to help with disposition            Subjective: Feels better  Physical Exam: Vitals:   10/07/23 1959 10/08/23 0527 10/08/23 0810 10/08/23 1355  BP: (!) 180/93 (!) 188/96  (!) 146/82  Pulse: (!) 106   (!) 108  Resp: 18 20  16   Temp: 98.4 F (36.9 C) 98.5 F (36.9 C)  98.7 F (37.1 C)  TempSrc: Oral Oral  Oral  SpO2: 100% 99% 99% 100%  Weight:      Height:       Gen:- Awake Alert, dyspnea on exertion but no dyspnea at rest HEENT:- [place].AT, No sclera icterus Neck-Supple Neck,No JVD,.  Lungs-improving air movement, no significant wheezing CV- S1, S2 normal, regular  Abd-  +ve B.Sounds, Abd Soft, No tenderness,    Extremity/Skin:- No  edema, pedal pulses present  Psych-affect is appropriate, oriented x3 Neuro-no new focal deficits, no tremors    Data Reviewed: Potassium 5.3, bicarb 19, BUN 27, creatinine 1.79, anion gap 10 Labs reviewed  Family Communication: Plan of care discussed with patient in detail  Disposition: Status is: Inpatient Remains inpatient appropriate because: On IV fluids for AKI  Planned Discharge Destination: Homeless shelter    Time spent: 55 minutes  Author: Aimee Somerset, MD 10/08/2023 3:40 PM  For on call review [email or web address].

## 2023-10-08 NOTE — Plan of Care (Signed)

## 2023-10-08 NOTE — Plan of Care (Signed)

## 2023-10-08 NOTE — Inpatient Diabetes Management (Signed)
 Inpatient Diabetes Program Recommendations  AACE/ADA: New Consensus Statement on Inpatient Glycemic Control (2015)  Target Ranges:  Prepandial:   less than 140 mg/dL      Peak postprandial:   less than 180 mg/dL (1-2 hours)      Critically ill patients:  140 - 180 mg/dL   Lab Results  Component Value Date   GLUCAP 248 (H) 10/08/2023   HGBA1C 8.6 (H) 10/06/2023    Review of Glycemic Control  Diabetes history: DM2 Outpatient Diabetes medications: Lantus  14 daily, Novolog  0-15 TID (not taking any insulin ) Current orders for Inpatient glycemic control: Lantus  18 units at bedtime, Novolog  0-20 TID with meals and 0-10 HS On Solumedrol 40 daily  HgbA1C - 8.6%  Inpatient Diabetes Program Recommendations:    Increase Lantus  to 20 units at bedtime  Add Novolog  4 units TID with meals if eating > 50% while on steroids.  Will continue to follow.  Thank you. Shona Brandy, RD, LDN, CDCES Inpatient Diabetes Coordinator [phone]

## 2023-10-09 DIAGNOSIS — G4730 Sleep apnea, unspecified: Secondary | ICD-10-CM

## 2023-10-09 DIAGNOSIS — J208 Acute bronchitis due to other specified organisms: Secondary | ICD-10-CM

## 2023-10-09 DIAGNOSIS — N179 Acute kidney failure, unspecified: Secondary | ICD-10-CM | POA: Diagnosis not present

## 2023-10-09 LAB — LEGIONELLA PNEUMOPHILA SEROGP 1 UR AG: L. pneumophila Serogp 1 Ur Ag: NEGATIVE

## 2023-10-09 MED ORDER — OMEPRAZOLE 40 MG PO CPDR: 40.0000 mg | DELAYED_RELEASE_CAPSULE | Freq: Every day | ORAL | 3 refills | Status: DC

## 2023-10-09 MED ORDER — ALBUTEROL SULFATE HFA 108 (90 BASE) MCG/ACT IN AERS: 2.0000 | INHALATION_SPRAY | Freq: Four times a day (QID) | RESPIRATORY_TRACT | 2 refills | Status: DC | PRN

## 2023-10-09 MED ORDER — LEVOFLOXACIN 750 MG PO TABS: 750.0000 mg | ORAL_TABLET | ORAL | 0 refills | Status: AC

## 2023-10-09 MED ORDER — LISINOPRIL 20 MG PO TABS: 20.0000 mg | ORAL_TABLET | Freq: Every day | ORAL | 5 refills | Status: DC

## 2023-10-09 MED ORDER — AMLODIPINE BESYLATE 10 MG PO TABS: 10.0000 mg | ORAL_TABLET | Freq: Every day | ORAL | 5 refills | Status: DC

## 2023-10-09 MED ORDER — MONTELUKAST SODIUM 10 MG PO TABS: 10.0000 mg | ORAL_TABLET | Freq: Every day | ORAL | 5 refills | Status: DC

## 2023-10-09 MED ORDER — FLUTICASONE FUROATE-VILANTEROL 100-25 MCG/ACT IN AEPB: 1.0000 | INHALATION_SPRAY | Freq: Every day | RESPIRATORY_TRACT | 4 refills | Status: DC

## 2023-10-09 MED ORDER — PREDNISONE 20 MG PO TABS: 40.0000 mg | ORAL_TABLET | Freq: Every day | ORAL | 0 refills | Status: AC

## 2023-10-09 MED ORDER — ASPIRIN 81 MG PO TBEC: 81.0000 mg | DELAYED_RELEASE_TABLET | Freq: Every day | ORAL | 11 refills | Status: DC

## 2023-10-09 MED ORDER — METOPROLOL SUCCINATE ER 25 MG PO TB24
12.5000 mg | ORAL_TABLET | Freq: Every day | ORAL | 0 refills | Status: DC
  Filled 2024-01-22: qty 30, 60d supply, fill #0

## 2023-10-09 NOTE — Progress Notes (Signed)
 Pt left Against Medical Advice(AMA).MD notified and charge nurse notified.AMA paperwork signed.Attempted to explain risks and consequences to the patient by MD,but the patient chose to leave AMA.

## 2023-10-09 NOTE — Plan of Care (Signed)

## 2023-10-09 NOTE — TOC Transition Note (Signed)
 Transition of Care [facility]) - Discharge Note   Patient Details  Name: Sara [name] MRN: [id number] Date of Birth: 1975-09-28  [facility] CM/SW Contact:  Tawni CHRISTELLA Eva, LCSW Phone Number: 10/09/2023, 8:24 AM   Clinical Narrative:     CSW received consult for PCP and Homelessness. Pt has Ronalee Piety list in chart as PCP. CSW spoke with pt to discuss consult pt stated I don't need nothing but my discharge papers.  CSW informed MD and RN. No further IPCM need, Signing off.     Final next level of care: Home/Self Care Barriers to Discharge: Barriers Resolved   Patient Goals and CMS Choice Patient states their goals for this hospitalization and ongoing recovery are:: retun home          Discharge Placement                    Patient and family notified of of transfer: 10/09/23  Discharge Plan and Services Additional resources added to the After Visit Summary for                                       Social Drivers of Health (SDOH) Interventions SDOH Screenings   Food Insecurity: No Food Insecurity (10/06/2023)  Housing: High Risk (10/06/2023)  Transportation Needs: Unmet Transportation Needs (10/06/2023)  Utilities: Patient Declined (10/06/2023)  Alcohol Screen: Low Risk  (10/14/2022)  Financial Resource Strain: At Risk (03/16/2023)   Received from [facility]  Physical Activity: Not at Risk (03/16/2023)   Received from [facility]  Social Connections: At Risk (03/16/2023)   Received from [facility]  Stress: Not at Risk (03/16/2023)   Received from [facility]  Tobacco Use: High Risk (10/05/2023)     Readmission Risk Interventions     No data to display

## 2023-10-09 NOTE — Discharge Summary (Signed)
 Sara [name], is a 48 y.o. female  DOB [birth year]  MRN [id number].  Admission date:  10/05/2023  Admitting Physician  Aimee Somerset, MD  Discharge Date:  10/09/2023   Primary MD  Lenon Homer, MD  Recommendations for primary care physician for things to follow:  1)Avoid ibuprofen /Advil /Aleve/Motrin Josefine Powders/Naproxen/BC powders/Meloxicam/Diclofenac/Indomethacin and other Nonsteroidal anti-inflammatory medications as these will make you more likely to bleed and can cause stomach ulcers, can also cause Kidney problems.   2)Follow up with Lenon Homer, MD (Primary Care Provider)-----for repeat BMP in 1 week  Admission Diagnosis  Acute bronchitis [J20.9] Shortness of breath [R06.02] Hyperglycemia [R73.9] AKI (acute kidney injury) (HCC) [N17.9] Symptomatic anemia [D64.9]   Discharge Diagnosis  Acute bronchitis [J20.9] Shortness of breath [R06.02] Hyperglycemia [R73.9] AKI (acute kidney injury) (HCC) [N17.9] Symptomatic anemia [D64.9]    Principal Problem:   Acute bronchitis Active Problems:   CAP (community acquired pneumonia)   AKI (acute kidney injury) (HCC)   Normocytic anemia   Type II diabetes mellitus (HCC)   (HFpEF) heart failure with preserved ejection fraction (HCC)   Essential hypertension   Hyperlipidemia   Sleep apnea   Tobacco use disorder      Past Medical History:  Diagnosis Date   Acid reflux    Chronic pain    Depression    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 pylori infection    High risk medication use    Hypercholesterolemia    Hyperlipidemia, mixed    Hypertension    Other mixed anxiety disorders    Pollen allergies    Sickle cell trait (HCC)    Sleep apnea    Vitamin D deficiency     Past Surgical History:  Procedure Laterality Date   ABDOMINAL HYSTERECTOMY      APPENDECTOMY     COLONOSCOPY  08-16-2008   Dr. Princella   ESOPHAGOGASTRODUODENOSCOPY  08-16-2008   Dr. Anette      HPI  from the history and physical done on the day of admission:   HPI:    Sara [name] is a 48 y.o. year old female with past medical history of HTN, HLD, DM, OSA, tobacco use, GERD, Chronic pain, Depression, Type II diabetes mellitus, Pollen allergies, Sickle cell trait (HCC), Sleep apnea, and Vitamin D deficiency. [name] presented to [facility], ED with dyspnea and chest heaviness with associated chest congestion and productive cough that began 2 weeks ago. Of note she was hospitalized for the same last week at [facility] and left AMA. Records not available in Care Everywhere, per patient's report she was being diuresed for what sounds like a CHF exacerbation diagnosis.  She called 911 last night due to a goal episode of hemoptysis. She denies any known recent sick contacts though she lives in a shelter.  She endorses bilateral lower extremity edema that she first noticed 2 weeks ago that resolves with elevation of her feet.  She also endorses a 15 to 20 pound weight gain over the last 2 to 3 months that she attributes to dietary indiscretion.  Lastly, she endorses medication noncompliance secondary to social issues with affordability of medication and her ability to actually get the medication.  She denies palpitations, nausea, vomiting, diarrhea, weakness.   ED Course: On arrival to [facility] ED patient was noted to be afebrile temp 36.9 C, BP 139/75, HR 109, RR 16, SpO2 98% on room air.  CXR obtained and shows bilateral perihilar interstitial opacities and CT angio PE obtained shows small airway infection/inflammation and no pulmonary embolism.  Labs notable for UA with glycosuria and proteinuria, mildly elevated BNP at 147.5, CO2 19, glucose 334, BUN 32, creatinine 1.81, WBC 15.2, hemoglobin 8.5, troponin 24-->24, albumin 2.4.  She was given a 500 mL NS  bolus, started on normal saline at 75 mL an hour, given, Zofran , and Percocet.  [facility] contacted for admission.     Review of Systems: As mentioned in the history of present illness. All other systems reviewed and are negative.     Hospital Course:   48 yo F PMH  HTN, HLD, DM2, OSA, tobacco use, GERD, Chronic pain, Depression, Type II diabetes mellitus, Pollen allergies, Sickle cell trait and Vitamin D deficiency admitted on 10/06/2023 with acute COPD exacerbation with concerns for bronchitis/CAP and AKI    Patient had elevated potassium and metabolic acidosis, on 10/08/2023, she was treated, refused recheck, refused blood draw and left AMA on 10/09/2023   Assessment and Plan:   1)Acute  bronchitis/possible CAP--patient presented with hemoptysis, CTA chest without PE - Finding of infectious/inflammatory changes -COVID flu and RSV negative - Patient received IV Rocephin ,--complained of itching after Rocephin  patient had removed history of penicillin allergy - unable to use doxycycline or azithromycin  due to allergies -Strep pneumo antigen negative - prescribed Levaquin  to complete treatment course - Mucolytics and bronchodilators as prescribed --I called patient's pharmacy in  to confirm that she  will get  only 3 tablets of Levaquin  750 mg to be taken every other day and Not 20 tablets   2) Acute COPD exacerbation Improved Okay to discharge on p.o. prednisone      3)AKI----acute kidney injury with hyperkalemia and metabolic acidosis -  creatinine on admission= 1.89 ,  baseline creatinine = 0.8 on 04/08/2023 and 0.6 on 01/10/2023  - creatinine is now= 1.79 with mild metabolic acidosis and mild hyperkalemia, -Continue to hold home lisinopril  -Patient had contrast study on 10/06/2023 -Patient received bicarbonate infusion  - Patient had elevated potassium and metabolic acidosis, on 10/08/2023, she was treated, refused recheck, refused blood draw and left AMA on 10/09/2023    4)HTN -Resume PTA regimen   5)OSA CPAP use encouraged   6)GERD-- Continue Protonix  especially while on steroids     7)DM2-A1c is 8.6 reflecting uncontrolled diabetes with hyperglycemia PTA -- resume PTA regimen and follow-up PCP for adjustment8)HLD--- continue atorvastatin    9)HFpEF--- history of chronic diastolic dysfunction CHF - No acute exacerbation' ECHO from December 2024 with LVEF 55 to 60% and grade 1 diastolic dysfunction.  - Continue metoprolol   Follow-up PCP to discuss resumption of lisinopril  after BMP checked   10)Social/Ethics--- patient is technically homeless, she lives in a homeless shelter -- [facility] consult to help with disposition - Patient refused input from [facility] staff -Patient had elevated potassium and metabolic acidosis, on 10/08/2023, she was treated, refused recheck, refused blood draw and left AMA on 10/09/2023   Discharge Condition: Left AMA  Follow UP=-PCP  Diet and Activity recommendation:  As advised  Discharge Instructions    Discharge Instructions     Diet - low sodium heart healthy   Complete by: As directed    Discharge instructions   Complete by: As directed    1)Avoid ibuprofen /Advil /Aleve/Motrin /Goody Powders/Naproxen/BC powders/Meloxicam/Diclofenac/Indomethacin and other Nonsteroidal anti-inflammatory medications as these will make you more likely to bleed and can cause stomach ulcers, can also cause Kidney problems.   2)Follow up with Lenon Homer, MD (Primary Care Provider)-----for repeat BMP in 1 week   Increase activity slowly   Complete by: As directed          Discharge Medications     Allergies as of 10/09/2023       Reactions   Amoxicillin Anaphylaxis, Rash   Biaxin  [clarithromycin ] Itching, Rash   Penicillins Anaphylaxis, Rash   Received ceftriaxone  in 2022 with no issues   Tetracyclines & Related Anaphylaxis   Vibramycin [doxycycline] Anaphylaxis, Rash   Flagyl  [metronidazole ] Itching, Rash   Red Dye #40  (allura Red) Itching        Medication List     STOP taking these medications    nabumetone 500 MG tablet Commonly known as: RELAFEN       TAKE these medications    acetaminophen  500 MG tablet Commonly known as: TYLENOL  Take 500-1,000 mg by mouth every 8 (eight) hours as needed for moderate pain (pain score 4-6), headache or fever.   albuterol  108 (90 Base) MCG/ACT inhaler Commonly known as: VENTOLIN  HFA Inhale 2 puffs into the lungs every 6 (six) hours as needed for wheezing or shortness of breath.   amLODipine  10 MG tablet Commonly known as: NORVASC  Take 1 tablet (10 mg total) by mouth daily. Start taking on: October 10, 2023   aspirin  EC 81 MG tablet Take 1 tablet (81 mg total) by mouth daily with breakfast. Swallow whole. What changed: when to take this   atorvastatin  40 MG tablet Commonly known as: LIPITOR Take 1 tablet (40 mg total) by mouth daily. What changed: when to take this   Colace 2-IN-1 8.6-50 MG tablet Generic drug: senna-docusate Take 1 tablet by mouth daily as needed (constipation).   cyclobenzaprine 10 MG tablet Commonly known as: FLEXERIL Take 10 mg by mouth 2 (two) times daily.   fluticasone  furoate-vilanterol 100-25 MCG/ACT Aepb Commonly known as: BREO ELLIPTA  Inhale 1 puff into the lungs daily. Start taking on: October 10, 2023   gabapentin  300 MG capsule Commonly known as: NEURONTIN  Take 2 capsules (600 mg total) by mouth 3 (three) times daily.   insulin  aspart 100 UNIT/ML injection Commonly known as: novoLOG  Inject 0-15 Units into the skin 3 (three) times daily with meals.   levofloxacin  750 MG tablet Commonly known as: Levaquin  Take 1 tablet (750 mg total) by mouth See admin instructions for 3 days. Take 1 Tab on Saturday, Monday and Wednesday --First dose on 10/10/23 Start taking on: October 10, 2023   lisinopril  20 MG tablet Commonly known as: ZESTRIL  Take 1 tablet (20 mg total) by mouth daily.   metoprolol   succinate 25 MG 24 hr tablet Commonly known as: TOPROL -XL Take 0.5 tablets (12.5 mg total) by mouth daily.   montelukast  10 MG tablet Commonly known as: SINGULAIR  Take 1 tablet (10 mg total) by mouth at bedtime.   omeprazole  40 MG capsule Commonly known as: PRILOSEC Take 1 capsule (40 mg total) by mouth daily.   predniSONE  20 MG tablet Commonly known as: DELTASONE  Take 2 tablets (  40 mg total) by mouth daily with breakfast for 5 days.   Semglee  (yfgn) 100 UNIT/ML injection Generic drug: insulin  glargine-yfgn Inject 14 Units into the skin daily.        Major procedures and Radiology Reports - PLEASE review detailed and final reports for all details, in brief -   CT Angio Chest PE W and/or Wo Contrast Result Date: 10/06/2023 EXAM: CTA of the Chest with contrast for PE 10/06/2023 01:44:28 AM TECHNIQUE: CTA of the chest was performed after the administration of intravenous contrast. Multiplanar reformatted images are provided for review. MIP images are provided for review. Automated exposure control, iterative reconstruction, and/or weight based adjustment of the mA/kV was utilized to reduce the radiation dose to as low as reasonably achievable. COMPARISON: X-ray 10/05/2023 and CT chest 06/14/2023. CLINICAL HISTORY: Hemoptysis. Pt complains of shortness of breath and chest pain. History of CHF, diabetes, asthma. Recently admitted in [facility] for CHF exacerbation. FINDINGS: PULMONARY ARTERIES: Pulmonary arteries are adequately opacified for evaluation. No pulmonary embolism. Main pulmonary artery is normal in caliber. MEDIASTINUM: The heart and pericardium demonstrate no acute abnormality. There is no acute abnormality of the thoracic aorta. Normal caliber thoracic aorta without dissection. Aortic atherosclerotic calcification. No pericardial effusion. LYMPH NODES: No mediastinal, hilar or axillary lymphadenopathy. LUNGS AND PLEURA: Diffuse centrilobular micronodules and ground-glass  nodules greatest in the upper lobes. Findings are compatible with small airway infection/inflammation. No pleural effusion or pneumothorax. UPPER ABDOMEN: Limited images of the upper abdomen are unremarkable. SOFT TISSUES AND BONES: No acute bone or soft tissue abnormality. IMPRESSION: 1. No pulmonary embolism. 2. Small airway infection/inflammation with an upper lobe predominance . Electronically signed by: Norman Gatlin MD 10/06/2023 01:57 AM EDT RP Workstation: [id number]   DG Chest 2 View Result Date: 10/05/2023 CLINICAL DATA:  CP SOB EXAM: CHEST - 2 VIEW COMPARISON:  None available. FINDINGS: Diffuse interstitial opacities throughout both lungs. No focal airspace consolidation, pleural effusion, or pneumothorax. No cardiomegaly.No acute fracture or destructive lesion. IMPRESSION: Bilateral perihilar interstitial opacities, which may represent interstitial edema or atypical/viral infection, in the correct clinical context. Electronically Signed   By: Rogelia Myers M.D.   On: 10/05/2023 17:56    Micro Results  Recent Results (from the past 240 hours)  Resp panel by RT-PCR (RSV, Flu A&B, Covid) Anterior Nasal Swab     Status: None   Collection Time: 10/06/23  5:22 PM   Specimen: Anterior Nasal Swab  Result Value Ref Range Status   SARS Coronavirus 2 by RT PCR NEGATIVE NEGATIVE Final    Comment: (NOTE) SARS-CoV-2 target nucleic acids are NOT DETECTED.  The SARS-CoV-2 RNA is generally detectable in upper respiratory specimens during the acute phase of infection. The lowest concentration of SARS-CoV-2 viral copies this assay can detect is 138 copies/mL. A negative result does not preclude SARS-Cov-2 infection and should not be used as the sole basis for treatment or other patient management decisions. A negative result may occur with  improper specimen collection/handling, submission of specimen other than nasopharyngeal swab, presence of viral mutation(s) within the areas targeted by this  assay, and inadequate number of viral copies(<138 copies/mL). A negative result must be combined with clinical observations, patient history, and epidemiological information. The expected result is Negative.  Fact Sheet for Patients:  BloggerCourse.com  Fact Sheet for Healthcare Providers:  SeriousBroker.it  This test is no t yet approved or cleared by the [place]  FDA and  has been authorized for detection and/or diagnosis of SARS-CoV-2 by FDA  under an Emergency Use Authorization (EUA). This EUA will remain  in effect (meaning this test can be used) for the duration of the COVID-19 declaration under Section 564(b)(1) of the Act, 21 U.S.C.section 360bbb-3(b)(1), unless the authorization is terminated  or revoked sooner.       Influenza A by PCR NEGATIVE NEGATIVE Final   Influenza B by PCR NEGATIVE NEGATIVE Final    Comment: (NOTE) The Xpert Xpress SARS-CoV-2/FLU/RSV plus assay is intended as an aid in the diagnosis of influenza from Nasopharyngeal swab specimens and should not be used as a sole basis for treatment. Nasal washings and aspirates are unacceptable for Xpert Xpress SARS-CoV-2/FLU/RSV testing.  Fact Sheet for Patients: BloggerCourse.com  Fact Sheet for Healthcare Providers: SeriousBroker.it  This test is not yet approved or cleared by the [place]  FDA and has been authorized for detection and/or diagnosis of SARS-CoV-2 by FDA under an Emergency Use Authorization (EUA). This EUA will remain in effect (meaning this test can be used) for the duration of the COVID-19 declaration under Section 564(b)(1) of the Act, 21 U.S.C. section 360bbb-3(b)(1), unless the authorization is terminated or revoked.     Resp Syncytial Virus by PCR NEGATIVE NEGATIVE Final    Comment: (NOTE) Fact Sheet for Patients: BloggerCourse.com  Fact Sheet for  Healthcare Providers: SeriousBroker.it  This test is not yet approved or cleared by the [place]  FDA and has been authorized for detection and/or diagnosis of SARS-CoV-2 by FDA under an Emergency Use Authorization (EUA). This EUA will remain in effect (meaning this test can be used) for the duration of the COVID-19 declaration under Section 564(b)(1) of the Act, 21 U.S.C. section 360bbb-3(b)(1), unless the authorization is terminated or revoked.  Performed at [facility], [address], [place], [place] [address]     Today   Subjective    Amiel Sharrow today has no new complaints Patient had elevated potassium and metabolic acidosis, on 10/08/2023, she was treated, refused recheck, refused blood draw and left AMA on 10/09/2023        Patient has been seen and examined prior to discharge   Objective   Blood pressure (!) 164/89, pulse (!) 108, temperature 98.7 F (37.1 C), temperature source Oral, resp. rate 18, height 5' 8 (1.727 m), weight 64.5 kg, SpO2 100%.   Intake/Output Summary (Last 24 hours) at 10/09/2023 1029 Last data filed at 10/08/2023 1900 Gross per 24 hour  Intake 600 ml  Output 800 ml  Net -200 ml    Exam Gen:- Awake Alert, no acute distress  HEENT:- [place].AT, No sclera icterus Neck-Supple Neck,No JVD,.  Lungs-  CTAB , good air movement bilaterally CV- S1, S2 normal, regular Abd-  +ve B.Sounds, Abd Soft, No tenderness,    Extremity/Skin:- No  edema,   good pulses Psych-affect is appropriate, oriented x3 Neuro-no new focal deficits, no tremors    Data Review   CBC w Diff:  Lab Results  Component Value Date   WBC 11.2 (H) 10/07/2023   HGB 8.1 (L) 10/07/2023   HCT 26.2 (L) 10/07/2023   PLT 159 10/07/2023   LYMPHOPCT 35 01/09/2023   MONOPCT 6 01/09/2023   EOSPCT 2 01/09/2023   BASOPCT 1 01/09/2023    CMP:  Lab Results  Component Value Date   NA 135 10/08/2023   K 5.3 (H) 10/08/2023   CL 107  10/08/2023   CO2 19 (L) 10/08/2023   BUN 27 (H) 10/08/2023   CREATININE 1.79 (H) 10/08/2023   PROT 5.3 (  L) 10/06/2023   ALBUMIN 3.0 (L) 10/08/2023   BILITOT 0.4 10/06/2023   ALKPHOS 57 10/06/2023   AST 16 10/06/2023   ALT 14 10/06/2023  .  Total Discharge time is about 33 minutes  Rendall Carwin M.D on 10/09/2023 at 10:29 AM  Go to [email or web address] -  for contact info  [facility] - Office  [phone]

## 2023-12-30 ENCOUNTER — Inpatient Hospital Stay (HOSPITAL_COMMUNITY)
Admission: EM | Admit: 2023-12-30 | Discharge: 2024-01-22 | DRG: 628 | Disposition: A | Discharge status: Home / Self Care | Payer: MEDICAID | Attending: Student in an Organized Health Care Education/Training Program | Admitting: Student in an Organized Health Care Education/Training Program

## 2023-12-30 DIAGNOSIS — Z91128 Patient's intentional underdosing of medication regimen for other reason: Secondary | ICD-10-CM

## 2023-12-30 DIAGNOSIS — N186 End stage renal disease: Secondary | ICD-10-CM | POA: Diagnosis present

## 2023-12-30 DIAGNOSIS — Z9102 Food additives allergy status: Secondary | ICD-10-CM

## 2023-12-30 DIAGNOSIS — E1122 Type 2 diabetes mellitus with diabetic chronic kidney disease: Secondary | ICD-10-CM | POA: Diagnosis present

## 2023-12-30 DIAGNOSIS — T383X6A Underdosing of insulin and oral hypoglycemic [antidiabetic] drugs, initial encounter: Secondary | ICD-10-CM | POA: Diagnosis present

## 2023-12-30 DIAGNOSIS — I10 Essential (primary) hypertension: Secondary | ICD-10-CM | POA: Diagnosis present

## 2023-12-30 DIAGNOSIS — K219 Gastro-esophageal reflux disease without esophagitis: Secondary | ICD-10-CM | POA: Diagnosis present

## 2023-12-30 DIAGNOSIS — Z992 Dependence on renal dialysis: Secondary | ICD-10-CM

## 2023-12-30 DIAGNOSIS — G4700 Insomnia, unspecified: Secondary | ICD-10-CM | POA: Diagnosis present

## 2023-12-30 DIAGNOSIS — Z6831 Body mass index (BMI) 31.0-31.9, adult: Secondary | ICD-10-CM

## 2023-12-30 DIAGNOSIS — Z7951 Long term (current) use of inhaled steroids: Secondary | ICD-10-CM

## 2023-12-30 DIAGNOSIS — Z9049 Acquired absence of other specified parts of digestive tract: Secondary | ICD-10-CM

## 2023-12-30 DIAGNOSIS — D573 Sickle-cell trait: Secondary | ICD-10-CM | POA: Diagnosis present

## 2023-12-30 DIAGNOSIS — Z7982 Long term (current) use of aspirin: Secondary | ICD-10-CM

## 2023-12-30 DIAGNOSIS — Z79899 Other long term (current) drug therapy: Secondary | ICD-10-CM

## 2023-12-30 DIAGNOSIS — H409 Unspecified glaucoma: Secondary | ICD-10-CM | POA: Diagnosis present

## 2023-12-30 DIAGNOSIS — E785 Hyperlipidemia, unspecified: Secondary | ICD-10-CM | POA: Insufficient documentation

## 2023-12-30 DIAGNOSIS — D631 Anemia in chronic kidney disease: Secondary | ICD-10-CM | POA: Diagnosis present

## 2023-12-30 DIAGNOSIS — E8770 Fluid overload, unspecified: Secondary | ICD-10-CM | POA: Diagnosis present

## 2023-12-30 DIAGNOSIS — H538 Other visual disturbances: Secondary | ICD-10-CM | POA: Diagnosis present

## 2023-12-30 DIAGNOSIS — Z5901 Sheltered homelessness: Secondary | ICD-10-CM

## 2023-12-30 DIAGNOSIS — F419 Anxiety disorder, unspecified: Secondary | ICD-10-CM | POA: Diagnosis present

## 2023-12-30 DIAGNOSIS — Z880 Allergy status to penicillin: Secondary | ICD-10-CM

## 2023-12-30 DIAGNOSIS — N179 Acute kidney failure, unspecified: Secondary | ICD-10-CM | POA: Diagnosis present

## 2023-12-30 DIAGNOSIS — E1100 Type 2 diabetes mellitus with hyperosmolarity without nonketotic hyperglycemic-hyperosmolar coma (NKHHC): Principal | ICD-10-CM | POA: Diagnosis present

## 2023-12-30 DIAGNOSIS — R3129 Other microscopic hematuria: Secondary | ICD-10-CM | POA: Diagnosis present

## 2023-12-30 DIAGNOSIS — F339 Major depressive disorder, recurrent, unspecified: Secondary | ICD-10-CM | POA: Diagnosis present

## 2023-12-30 DIAGNOSIS — G4730 Sleep apnea, unspecified: Secondary | ICD-10-CM | POA: Diagnosis present

## 2023-12-30 DIAGNOSIS — G4733 Obstructive sleep apnea (adult) (pediatric): Secondary | ICD-10-CM | POA: Diagnosis present

## 2023-12-30 DIAGNOSIS — Z833 Family history of diabetes mellitus: Secondary | ICD-10-CM

## 2023-12-30 DIAGNOSIS — R45851 Suicidal ideations: Secondary | ICD-10-CM | POA: Diagnosis present

## 2023-12-30 DIAGNOSIS — Z883 Allergy status to other anti-infective agents status: Secondary | ICD-10-CM

## 2023-12-30 DIAGNOSIS — E1151 Type 2 diabetes mellitus with diabetic peripheral angiopathy without gangrene: Secondary | ICD-10-CM | POA: Diagnosis present

## 2023-12-30 DIAGNOSIS — E782 Mixed hyperlipidemia: Secondary | ICD-10-CM | POA: Diagnosis present

## 2023-12-30 DIAGNOSIS — Z8249 Family history of ischemic heart disease and other diseases of the circulatory system: Secondary | ICD-10-CM

## 2023-12-30 DIAGNOSIS — E875 Hyperkalemia: Secondary | ICD-10-CM | POA: Diagnosis not present

## 2023-12-30 DIAGNOSIS — Z66 Do not resuscitate: Secondary | ICD-10-CM | POA: Diagnosis present

## 2023-12-30 DIAGNOSIS — I120 Hypertensive chronic kidney disease with stage 5 chronic kidney disease or end stage renal disease: Secondary | ICD-10-CM | POA: Diagnosis present

## 2023-12-30 DIAGNOSIS — K3184 Gastroparesis: Secondary | ICD-10-CM | POA: Diagnosis present

## 2023-12-30 DIAGNOSIS — E611 Iron deficiency: Secondary | ICD-10-CM | POA: Diagnosis present

## 2023-12-30 DIAGNOSIS — Z91199 Patient's noncompliance with other medical treatment and regimen due to unspecified reason: Secondary | ICD-10-CM

## 2023-12-30 DIAGNOSIS — Z91148 Patient's other noncompliance with medication regimen for other reason: Secondary | ICD-10-CM

## 2023-12-30 DIAGNOSIS — E1143 Type 2 diabetes mellitus with diabetic autonomic (poly)neuropathy: Secondary | ICD-10-CM | POA: Diagnosis present

## 2023-12-30 DIAGNOSIS — R739 Hyperglycemia, unspecified: Principal | ICD-10-CM

## 2023-12-30 DIAGNOSIS — E872 Acidosis, unspecified: Secondary | ICD-10-CM | POA: Diagnosis present

## 2023-12-30 DIAGNOSIS — F17210 Nicotine dependence, cigarettes, uncomplicated: Secondary | ICD-10-CM | POA: Diagnosis present

## 2023-12-30 DIAGNOSIS — E861 Hypovolemia: Secondary | ICD-10-CM | POA: Diagnosis present

## 2023-12-30 DIAGNOSIS — Z881 Allergy status to other antibiotic agents status: Secondary | ICD-10-CM

## 2023-12-30 NOTE — ED Triage Notes (Signed)
 EMS called for behavioral health SI and non compliant with meds and insulin .

## 2023-12-31 ENCOUNTER — Encounter (HOSPITAL_COMMUNITY): Payer: Self-pay | Admitting: Family Medicine

## 2023-12-31 ENCOUNTER — Other Ambulatory Visit: Payer: Self-pay

## 2023-12-31 DIAGNOSIS — E1143 Type 2 diabetes mellitus with diabetic autonomic (poly)neuropathy: Secondary | ICD-10-CM | POA: Diagnosis present

## 2023-12-31 DIAGNOSIS — K219 Gastro-esophageal reflux disease without esophagitis: Secondary | ICD-10-CM | POA: Insufficient documentation

## 2023-12-31 DIAGNOSIS — D631 Anemia in chronic kidney disease: Secondary | ICD-10-CM | POA: Diagnosis present

## 2023-12-31 DIAGNOSIS — N179 Acute kidney failure, unspecified: Secondary | ICD-10-CM | POA: Diagnosis present

## 2023-12-31 DIAGNOSIS — G4733 Obstructive sleep apnea (adult) (pediatric): Secondary | ICD-10-CM | POA: Diagnosis present

## 2023-12-31 DIAGNOSIS — I132 Hypertensive heart and chronic kidney disease with heart failure and with stage 5 chronic kidney disease, or end stage renal disease: Secondary | ICD-10-CM | POA: Diagnosis not present

## 2023-12-31 DIAGNOSIS — G4700 Insomnia, unspecified: Secondary | ICD-10-CM | POA: Diagnosis present

## 2023-12-31 DIAGNOSIS — N186 End stage renal disease: Secondary | ICD-10-CM | POA: Diagnosis present

## 2023-12-31 DIAGNOSIS — H409 Unspecified glaucoma: Secondary | ICD-10-CM | POA: Diagnosis present

## 2023-12-31 DIAGNOSIS — N189 Chronic kidney disease, unspecified: Secondary | ICD-10-CM

## 2023-12-31 DIAGNOSIS — F332 Major depressive disorder, recurrent severe without psychotic features: Secondary | ICD-10-CM | POA: Diagnosis not present

## 2023-12-31 DIAGNOSIS — F17210 Nicotine dependence, cigarettes, uncomplicated: Secondary | ICD-10-CM | POA: Diagnosis present

## 2023-12-31 DIAGNOSIS — E1151 Type 2 diabetes mellitus with diabetic peripheral angiopathy without gangrene: Secondary | ICD-10-CM | POA: Diagnosis present

## 2023-12-31 DIAGNOSIS — I10 Essential (primary) hypertension: Secondary | ICD-10-CM | POA: Diagnosis not present

## 2023-12-31 DIAGNOSIS — F17200 Nicotine dependence, unspecified, uncomplicated: Secondary | ICD-10-CM | POA: Diagnosis not present

## 2023-12-31 DIAGNOSIS — E782 Mixed hyperlipidemia: Secondary | ICD-10-CM | POA: Diagnosis present

## 2023-12-31 DIAGNOSIS — R45851 Suicidal ideations: Secondary | ICD-10-CM | POA: Diagnosis present

## 2023-12-31 DIAGNOSIS — E785 Hyperlipidemia, unspecified: Secondary | ICD-10-CM | POA: Diagnosis not present

## 2023-12-31 DIAGNOSIS — E872 Acidosis, unspecified: Secondary | ICD-10-CM | POA: Diagnosis present

## 2023-12-31 DIAGNOSIS — I5030 Unspecified diastolic (congestive) heart failure: Secondary | ICD-10-CM | POA: Diagnosis not present

## 2023-12-31 DIAGNOSIS — D573 Sickle-cell trait: Secondary | ICD-10-CM | POA: Diagnosis present

## 2023-12-31 DIAGNOSIS — Z66 Do not resuscitate: Secondary | ICD-10-CM | POA: Diagnosis present

## 2023-12-31 DIAGNOSIS — I120 Hypertensive chronic kidney disease with stage 5 chronic kidney disease or end stage renal disease: Secondary | ICD-10-CM | POA: Diagnosis present

## 2023-12-31 DIAGNOSIS — F419 Anxiety disorder, unspecified: Secondary | ICD-10-CM | POA: Diagnosis present

## 2023-12-31 DIAGNOSIS — Z5901 Sheltered homelessness: Secondary | ICD-10-CM | POA: Diagnosis not present

## 2023-12-31 DIAGNOSIS — E1100 Type 2 diabetes mellitus with hyperosmolarity without nonketotic hyperglycemic-hyperosmolar coma (NKHHC): Secondary | ICD-10-CM | POA: Diagnosis present

## 2023-12-31 DIAGNOSIS — Z91199 Patient's noncompliance with other medical treatment and regimen due to unspecified reason: Secondary | ICD-10-CM | POA: Diagnosis not present

## 2023-12-31 DIAGNOSIS — F339 Major depressive disorder, recurrent, unspecified: Secondary | ICD-10-CM | POA: Diagnosis present

## 2023-12-31 DIAGNOSIS — Z6831 Body mass index (BMI) 31.0-31.9, adult: Secondary | ICD-10-CM | POA: Diagnosis not present

## 2023-12-31 DIAGNOSIS — E1122 Type 2 diabetes mellitus with diabetic chronic kidney disease: Secondary | ICD-10-CM | POA: Diagnosis present

## 2023-12-31 DIAGNOSIS — E861 Hypovolemia: Secondary | ICD-10-CM | POA: Diagnosis present

## 2023-12-31 DIAGNOSIS — R739 Hyperglycemia, unspecified: Secondary | ICD-10-CM | POA: Diagnosis present

## 2023-12-31 DIAGNOSIS — Z992 Dependence on renal dialysis: Secondary | ICD-10-CM | POA: Diagnosis not present

## 2023-12-31 DIAGNOSIS — E875 Hyperkalemia: Secondary | ICD-10-CM | POA: Diagnosis not present

## 2023-12-31 LAB — I-STAT CHEM 8, ED
BUN: 28 mg/dL — ABNORMAL HIGH (ref 6–20)
Calcium, Ion: 1.14 mmol/L — ABNORMAL LOW (ref 1.15–1.40)
Chloride: 109 mmol/L (ref 98–111)
Creatinine, Ser: 3.1 mg/dL — ABNORMAL HIGH (ref 0.44–1.00)
Glucose, Bld: 498 mg/dL — ABNORMAL HIGH (ref 70–99)
HCT: 24 % — ABNORMAL LOW (ref 36.0–46.0)
Hemoglobin: 8.2 g/dL — ABNORMAL LOW (ref 12.0–15.0)
Potassium: 4.4 mmol/L (ref 3.5–5.1)
Sodium: 140 mmol/L (ref 135–145)
TCO2: 18 mmol/L — ABNORMAL LOW (ref 22–32)

## 2023-12-31 LAB — CBG MONITORING, ED
Glucose-Capillary: 102 mg/dL — ABNORMAL HIGH (ref 70–99)
Glucose-Capillary: 145 mg/dL — ABNORMAL HIGH (ref 70–99)
Glucose-Capillary: 159 mg/dL — ABNORMAL HIGH (ref 70–99)
Glucose-Capillary: 189 mg/dL — ABNORMAL HIGH (ref 70–99)
Glucose-Capillary: 193 mg/dL — ABNORMAL HIGH (ref 70–99)
Glucose-Capillary: 219 mg/dL — ABNORMAL HIGH (ref 70–99)
Glucose-Capillary: 224 mg/dL — ABNORMAL HIGH (ref 70–99)
Glucose-Capillary: 287 mg/dL — ABNORMAL HIGH (ref 70–99)
Glucose-Capillary: 508 mg/dL (ref 70–99)

## 2023-12-31 LAB — CBC WITH DIFFERENTIAL/PLATELET
Abs Immature Granulocytes: 0.09 K/uL — ABNORMAL HIGH (ref 0.00–0.07)
Basophils Absolute: 0.1 K/uL (ref 0.0–0.1)
Basophils Relative: 0 %
Eosinophils Absolute: 0.1 K/uL (ref 0.0–0.5)
Eosinophils Relative: 1 %
HCT: 25 % — ABNORMAL LOW (ref 36.0–46.0)
Hemoglobin: 8.1 g/dL — ABNORMAL LOW (ref 12.0–15.0)
Immature Granulocytes: 1 %
Lymphocytes Relative: 17 %
Lymphs Abs: 2.1 K/uL (ref 0.7–4.0)
MCH: 27.1 pg (ref 26.0–34.0)
MCHC: 32.4 g/dL (ref 30.0–36.0)
MCV: 83.6 fL (ref 80.0–100.0)
Monocytes Absolute: 0.9 K/uL (ref 0.1–1.0)
Monocytes Relative: 7 %
Neutro Abs: 9.1 K/uL — ABNORMAL HIGH (ref 1.7–7.7)
Neutrophils Relative %: 74 %
Platelets: 173 K/uL (ref 150–400)
RBC: 2.99 MIL/uL — ABNORMAL LOW (ref 3.87–5.11)
RDW: 14.4 % (ref 11.5–15.5)
WBC: 12.3 K/uL — ABNORMAL HIGH (ref 4.0–10.5)
nRBC: 0 % (ref 0.0–0.2)

## 2023-12-31 LAB — BASIC METABOLIC PANEL WITH GFR
Anion gap: 9 (ref 5–15)
BUN: 28 mg/dL — ABNORMAL HIGH (ref 6–20)
CO2: 17 mmol/L — ABNORMAL LOW (ref 22–32)
Calcium: 8.1 mg/dL — ABNORMAL LOW (ref 8.9–10.3)
Chloride: 113 mmol/L — ABNORMAL HIGH (ref 98–111)
Creatinine, Ser: 2.78 mg/dL — ABNORMAL HIGH (ref 0.44–1.00)
GFR, Estimated: 20 mL/min — ABNORMAL LOW (ref >60)
Glucose, Bld: 147 mg/dL — ABNORMAL HIGH (ref 70–99)
Potassium: 4 mmol/L (ref 3.5–5.1)
Sodium: 139 mmol/L (ref 135–145)

## 2023-12-31 LAB — URINALYSIS, ROUTINE W REFLEX MICROSCOPIC
Bilirubin Urine: NEGATIVE
Glucose, UA: >=500 mg/dL — AB
Ketones, ur: NEGATIVE mg/dL
Leukocytes,Ua: NEGATIVE
Nitrite: NEGATIVE
Protein, ur: >=300 mg/dL — AB
Specific Gravity, Urine: 1.015 (ref 1.005–1.030)
pH: 7 (ref 5.0–8.0)

## 2023-12-31 LAB — COMPREHENSIVE METABOLIC PANEL WITH GFR
ALT: 31 U/L (ref 0–44)
AST: 22 U/L (ref 15–41)
Albumin: 2.3 g/dL — ABNORMAL LOW (ref 3.5–5.0)
Alkaline Phosphatase: 88 U/L (ref 38–126)
Anion gap: 7 (ref 5–15)
BUN: 28 mg/dL — ABNORMAL HIGH (ref 6–20)
CO2: 21 mmol/L — ABNORMAL LOW (ref 22–32)
Calcium: 7.9 mg/dL — ABNORMAL LOW (ref 8.9–10.3)
Chloride: 109 mmol/L (ref 98–111)
Creatinine, Ser: 3.04 mg/dL — ABNORMAL HIGH (ref 0.44–1.00)
GFR, Estimated: 18 mL/min — ABNORMAL LOW (ref >60)
Glucose, Bld: 534 mg/dL (ref 70–99)
Potassium: 4.4 mmol/L (ref 3.5–5.1)
Sodium: 137 mmol/L (ref 135–145)
Total Bilirubin: 0.7 mg/dL (ref 0.0–1.2)
Total Protein: 5.4 g/dL — ABNORMAL LOW (ref 6.5–8.1)

## 2023-12-31 LAB — CBC
HCT: 28.1 % — ABNORMAL LOW (ref 36.0–46.0)
Hemoglobin: 8.3 g/dL — ABNORMAL LOW (ref 12.0–15.0)
MCH: 26.9 pg (ref 26.0–34.0)
MCHC: 29.5 g/dL — ABNORMAL LOW (ref 30.0–36.0)
MCV: 90.9 fL (ref 80.0–100.0)
Platelets: 148 K/uL — ABNORMAL LOW (ref 150–400)
RBC: 3.09 MIL/uL — ABNORMAL LOW (ref 3.87–5.11)
RDW: 14.5 % (ref 11.5–15.5)
WBC: 10 K/uL (ref 4.0–10.5)
nRBC: 0 % (ref 0.0–0.2)

## 2023-12-31 LAB — GLUCOSE, CAPILLARY
Glucose-Capillary: 285 mg/dL — ABNORMAL HIGH (ref 70–99)
Glucose-Capillary: 243 mg/dL — ABNORMAL HIGH (ref 70–99)

## 2023-12-31 LAB — BETA-HYDROXYBUTYRIC ACID: Beta-Hydroxybutyric Acid: 0.17 mmol/L (ref 0.05–0.27)

## 2023-12-31 MED ORDER — TRAZODONE HCL 50 MG PO TABS
25.0000 mg | ORAL_TABLET | Freq: Every evening | ORAL | Status: AC | PRN
  Administered 2024-01-03 – 2024-01-20 (×6): 25 mg via ORAL
  Filled 2023-12-31 (×8): qty 1

## 2023-12-31 MED ORDER — CYCLOBENZAPRINE HCL 5 MG PO TABS
5.0000 mg | ORAL_TABLET | Freq: Three times a day (TID) | ORAL | Status: DC | PRN
  Administered 2023-12-31 – 2024-01-15 (×3): 5 mg via ORAL
  Filled 2023-12-31 (×4): qty 1

## 2023-12-31 MED ORDER — AMLODIPINE BESYLATE 10 MG PO TABS
10.0000 mg | ORAL_TABLET | Freq: Every day | ORAL | Status: AC
  Administered 2023-12-31 – 2024-01-22 (×21): 10 mg via ORAL
  Filled 2023-12-31 (×19): qty 1
  Filled 2023-12-31: qty 2
  Filled 2023-12-31 (×4): qty 1

## 2023-12-31 MED ORDER — ONDANSETRON HCL 4 MG PO TABS
4.0000 mg | ORAL_TABLET | Freq: Four times a day (QID) | ORAL | Status: AC | PRN
  Administered 2024-01-05 – 2024-01-10 (×4): 4 mg via ORAL
  Filled 2023-12-31 (×4): qty 1

## 2023-12-31 MED ORDER — SODIUM CHLORIDE 0.9 % IV SOLN: INTRAVENOUS | Status: DC

## 2023-12-31 MED ORDER — INSULIN REGULAR(HUMAN) IN NACL 100-0.9 UT/100ML-% IV SOLN
INTRAVENOUS | Status: DC
  Administered 2023-12-31: 8 [IU]/h via INTRAVENOUS
  Filled 2023-12-31: qty 100

## 2023-12-31 MED ORDER — LACTATED RINGERS IV SOLN: INTRAVENOUS | Status: DC

## 2023-12-31 MED ORDER — SODIUM CHLORIDE 0.9 % IV BOLUS
1000.0000 mL | Freq: Once | INTRAVENOUS | Status: AC
  Administered 2023-12-31: 1000 mL via INTRAVENOUS

## 2023-12-31 MED ORDER — ENOXAPARIN SODIUM 40 MG/0.4ML IJ SOSY: 40.0000 mg | PREFILLED_SYRINGE | INTRAMUSCULAR | Status: DC

## 2023-12-31 MED ORDER — GABAPENTIN 300 MG PO CAPS
300.0000 mg | ORAL_CAPSULE | Freq: Three times a day (TID) | ORAL | Status: DC
  Administered 2023-12-31 – 2024-01-02 (×8): 300 mg via ORAL
  Filled 2023-12-31 (×8): qty 1

## 2023-12-31 MED ORDER — ASPIRIN 81 MG PO TBEC
81.0000 mg | DELAYED_RELEASE_TABLET | Freq: Every day | ORAL | Status: DC
  Administered 2023-12-31 – 2024-01-04 (×5): 81 mg via ORAL
  Filled 2023-12-31 (×5): qty 1

## 2023-12-31 MED ORDER — INSULIN ASPART 100 UNIT/ML IJ SOLN
0.0000 [IU] | Freq: Every day | INTRAMUSCULAR | Status: DC
  Administered 2023-12-31: 2 [IU] via SUBCUTANEOUS
  Administered 2024-01-01 – 2024-01-03 (×3): 3 [IU] via SUBCUTANEOUS
  Administered 2024-01-04: 4 [IU] via SUBCUTANEOUS
  Administered 2024-01-05: 2 [IU] via SUBCUTANEOUS
  Administered 2024-01-06: 3 [IU] via SUBCUTANEOUS
  Filled 2023-12-31 (×2): qty 3
  Filled 2023-12-31: qty 4
  Filled 2023-12-31: qty 2
  Filled 2023-12-31 (×2): qty 3
  Filled 2023-12-31: qty 2

## 2023-12-31 MED ORDER — INSULIN GLARGINE-YFGN 100 UNIT/ML ~~LOC~~ SOLN
10.0000 [IU] | Freq: Every day | SUBCUTANEOUS | Status: DC
  Administered 2023-12-31 – 2024-01-02 (×3): 10 [IU] via SUBCUTANEOUS
  Filled 2023-12-31 (×3): qty 0.1

## 2023-12-31 MED ORDER — ONDANSETRON HCL 4 MG/2ML IJ SOLN
4.0000 mg | Freq: Four times a day (QID) | INTRAMUSCULAR | Status: DC | PRN
  Administered 2023-12-31 – 2024-01-22 (×20): 4 mg via INTRAVENOUS
  Filled 2023-12-31 (×19): qty 2

## 2023-12-31 MED ORDER — INSULIN ASPART 100 UNIT/ML IJ SOLN
0.0000 [IU] | Freq: Three times a day (TID) | INTRAMUSCULAR | Status: AC
  Administered 2023-12-31 – 2024-01-01 (×2): 2 [IU] via SUBCUTANEOUS
  Administered 2024-01-01: 1 [IU] via SUBCUTANEOUS
  Administered 2024-01-01 – 2024-01-02 (×2): 2 [IU] via SUBCUTANEOUS
  Administered 2024-01-02: 3 [IU] via SUBCUTANEOUS
  Administered 2024-01-02: 2 [IU] via SUBCUTANEOUS
  Administered 2024-01-03: 3 [IU] via SUBCUTANEOUS
  Administered 2024-01-03 – 2024-01-08 (×6): 2 [IU] via SUBCUTANEOUS
  Administered 2024-01-09 – 2024-01-10 (×3): 1 [IU] via SUBCUTANEOUS
  Administered 2024-01-11: 18:00:00 2 [IU] via SUBCUTANEOUS
  Administered 2024-01-12 – 2024-01-21 (×6): 1 [IU] via SUBCUTANEOUS
  Administered 2024-01-21: 2 [IU] via SUBCUTANEOUS
  Filled 2023-12-31 (×2): qty 3
  Filled 2023-12-31 (×3): qty 2
  Filled 2023-12-31: qty 1
  Filled 2023-12-31: qty 2
  Filled 2023-12-31: qty 1
  Filled 2023-12-31: qty 4
  Filled 2023-12-31: qty 2
  Filled 2023-12-31 (×2): qty 1
  Filled 2023-12-31 (×2): qty 2
  Filled 2023-12-31: qty 1
  Filled 2023-12-31 (×3): qty 2
  Filled 2023-12-31 (×2): qty 1
  Filled 2023-12-31: qty 3
  Filled 2023-12-31: qty 2
  Filled 2023-12-31: qty 1
  Filled 2023-12-31: qty 2
  Filled 2023-12-31 (×2): qty 1
  Filled 2023-12-31: qty 2
  Filled 2023-12-31 (×2): qty 1
  Filled 2023-12-31: qty 2

## 2023-12-31 MED ORDER — ENOXAPARIN SODIUM 30 MG/0.3ML IJ SOSY
30.0000 mg | PREFILLED_SYRINGE | INTRAMUSCULAR | Status: DC
  Administered 2024-01-01 – 2024-01-06 (×5): 30 mg via SUBCUTANEOUS
  Filled 2023-12-31 (×7): qty 0.3

## 2023-12-31 MED ORDER — ALBUTEROL SULFATE (2.5 MG/3ML) 0.083% IN NEBU
3.0000 mL | INHALATION_SOLUTION | Freq: Four times a day (QID) | RESPIRATORY_TRACT | Status: AC | PRN
  Administered 2023-12-31 – 2024-01-14 (×16): 3 mL via RESPIRATORY_TRACT
  Filled 2023-12-31 (×17): qty 3

## 2023-12-31 MED ORDER — MONTELUKAST SODIUM 10 MG PO TABS
10.0000 mg | ORAL_TABLET | Freq: Every day | ORAL | Status: AC
  Administered 2023-12-31 – 2024-01-21 (×22): 10 mg via ORAL
  Filled 2023-12-31 (×22): qty 1

## 2023-12-31 MED ORDER — ATORVASTATIN CALCIUM 40 MG PO TABS
40.0000 mg | ORAL_TABLET | Freq: Every day | ORAL | Status: AC
  Administered 2023-12-31 – 2024-01-21 (×22): 40 mg via ORAL
  Filled 2023-12-31 (×24): qty 1

## 2023-12-31 MED ORDER — DEXTROSE IN LACTATED RINGERS 5 % IV SOLN: INTRAVENOUS | Status: DC

## 2023-12-31 MED ORDER — ACETAMINOPHEN 325 MG PO TABS
650.0000 mg | ORAL_TABLET | Freq: Four times a day (QID) | ORAL | Status: AC | PRN
  Administered 2023-12-31 – 2024-01-22 (×23): 650 mg via ORAL
  Filled 2023-12-31 (×21): qty 2

## 2023-12-31 MED ORDER — CYCLOBENZAPRINE HCL 5 MG PO TABS
10.0000 mg | ORAL_TABLET | Freq: Two times a day (BID) | ORAL | Status: AC
  Administered 2023-12-31 – 2024-01-22 (×43): 10 mg via ORAL
  Filled 2023-12-31 (×4): qty 2
  Filled 2023-12-31: qty 1
  Filled 2023-12-31: qty 2
  Filled 2023-12-31: qty 1
  Filled 2023-12-31 (×38): qty 2

## 2023-12-31 MED ORDER — MAGNESIUM HYDROXIDE 400 MG/5ML PO SUSP: 30.0000 mL | Freq: Every day | ORAL | Status: DC | PRN

## 2023-12-31 MED ORDER — PANTOPRAZOLE SODIUM 40 MG PO TBEC
40.0000 mg | DELAYED_RELEASE_TABLET | Freq: Every day | ORAL | Status: AC
  Administered 2023-12-31 – 2024-01-22 (×22): 40 mg via ORAL
  Filled 2023-12-31 (×23): qty 1

## 2023-12-31 MED ORDER — INSULIN REGULAR(HUMAN) IN NACL 100-0.9 UT/100ML-% IV SOLN
INTRAVENOUS | Status: DC
  Administered 2023-12-31: 3.2 [IU]/h via INTRAVENOUS

## 2023-12-31 MED ORDER — ACETAMINOPHEN 650 MG RE SUPP: 650.0000 mg | Freq: Four times a day (QID) | RECTAL | Status: DC | PRN

## 2023-12-31 MED ORDER — DEXTROSE 50 % IV SOLN: 0.0000 mL | INTRAVENOUS | Status: DC | PRN

## 2023-12-31 MED ORDER — INSULIN GLARGINE-YFGN 100 UNIT/ML ~~LOC~~ SOLN
5.0000 [IU] | Freq: Once | SUBCUTANEOUS | Status: AC
  Administered 2023-12-31: 5 [IU] via SUBCUTANEOUS
  Filled 2023-12-31: qty 0.05

## 2023-12-31 MED ORDER — FLUTICASONE FUROATE-VILANTEROL 100-25 MCG/ACT IN AEPB
1.0000 | INHALATION_SPRAY | Freq: Every day | RESPIRATORY_TRACT | Status: DC
  Administered 2024-01-01 – 2024-01-22 (×19): 1 via RESPIRATORY_TRACT
  Filled 2023-12-31 (×3): qty 28

## 2023-12-31 MED ORDER — SENNOSIDES-DOCUSATE SODIUM 8.6-50 MG PO TABS
1.0000 | ORAL_TABLET | Freq: Every day | ORAL | Status: DC | PRN
  Administered 2024-01-06 – 2024-01-09 (×3): 1 via ORAL
  Filled 2023-12-31 (×3): qty 1

## 2023-12-31 MED ORDER — METOPROLOL SUCCINATE ER 25 MG PO TB24
12.5000 mg | ORAL_TABLET | Freq: Every day | ORAL | Status: DC
  Administered 2023-12-31 – 2024-01-01 (×2): 12.5 mg via ORAL
  Filled 2023-12-31 (×2): qty 1

## 2023-12-31 NOTE — Assessment & Plan Note (Signed)
-   Will continue antihypertensive therapy.

## 2023-12-31 NOTE — Plan of Care (Signed)

## 2023-12-31 NOTE — ED Notes (Signed)
 Pt ate all of her lunch tray. Pt comfortable and seems to be in a good mood.

## 2023-12-31 NOTE — ED Notes (Signed)
 Plan of care discussed with MD, pharmacy messaged about semglee  dose.

## 2023-12-31 NOTE — Progress Notes (Signed)
 PROGRESS NOTE    Sara [name]  FMW:[id number] DOB: 01-18-1976 DOA: 12/30/2023 PCP: Lenon Homer, MD  Subjective: Patient reports having some crampy abdominal pain that is improved. She reports feeling tired and with a poor appetite but will try to eat.   Hospital Course: 48 y.o. African-American female with medical history significant for GERD, depression, type 2 diabetes mellitus, H. pylori infection, hypertension, dyslipidemia and OSA, who presented to the emergency room with acute onset of psychiatric evaluation for suicidal ideation and hyperglycemia. She was managed for hyperosmolar non-ketotic hyperglycemia with insulin  drip and transitioned to subQ insulin  once Bgs improved    Assessment and Plan: Type 2 diabetes mellitus with hyperosmolar nonketotic hyperglycemia - had hyperglycemia without anion gap or beta-hydroxybutyrate  - was managed with insulin  drip initially and transitioned to subQ insulin  - continue semglee  10U with ACHS - started on diet, monitor BG  Acute kidney injury superimposed on CKD - This is likely hypovolemic. Was given IVF  - Continue LR for today  - repeat BMP in the am   GERD without esophagitis - We will continue PPI therapy.  Dyslipidemia - Will continue statin therapy.  Essential hypertension - Will continue antihypertensive therapy.    DVT prophylaxis: enoxaparin  (LOVENOX ) injection 30 mg Start: 01/01/24 1000    Code Status: Do not attempt resuscitation (DNR) PRE-ARREST INTERVENTIONS DESIRED Disposition Plan: TBD Reason for continuing need for hospitalization: not med ready   Objective: Vitals:   12/31/23 0645 12/31/23 0700 12/31/23 0924 12/31/23 1058  BP: (!) 185/93 (!) 150/79  125/78  Pulse: (!) 108     Resp: (!) 28 16    Temp:   98.2 F (36.8 C)   TempSrc:   Oral   SpO2: 100%     Weight:      Height:        Intake/Output Summary (Last 24 hours) at 12/31/2023 1228 Last data filed at 12/31/2023 1108 Gross per 24 hour   Intake 1026.41 ml  Output --  Net 1026.41 ml   Filed Weights   12/31/23 0304  Weight: 90.7 kg    Examination:  Physical Exam Vitals and nursing note reviewed.  Constitutional:      General: She is not in acute distress. Cardiovascular:     Rate and Rhythm: Normal rate.  Pulmonary:     Effort: No respiratory distress.  Abdominal:     General: There is no distension.     Tenderness: There is no abdominal tenderness.  Musculoskeletal:     Right lower leg: No edema.     Left lower leg: No edema.     Data Reviewed: I have personally reviewed following labs and imaging studies  CBC: Recent Labs  Lab 12/31/23 0021 12/31/23 0103 12/31/23 0624  WBC 12.3*  --  10.0  NEUTROABS 9.1*  --   --   HGB 8.1* 8.2* 8.3*  HCT 25.0* 24.0* 28.1*  MCV 83.6  --  90.9  PLT 173  --  148*   Basic Metabolic Panel: Recent Labs  Lab 12/31/23 0021 12/31/23 0103 12/31/23 0624  NA 137 140 139  K 4.4 4.4 4.0  CL 109 109 113*  CO2 21*  --  17*  GLUCOSE 534* 498* 147*  BUN 28* 28* 28*  CREATININE 3.04* 3.10* 2.78*  CALCIUM  7.9*  --  8.1*   GFR: Estimated Creatinine Clearance: 28.8 mL/min (A) (by C-G formula based on SCr of 2.78 mg/dL (H)). Liver Function Tests: Recent Labs  Lab 12/31/23 0021  AST 22  ALT 31  ALKPHOS 88  BILITOT 0.7  PROT 5.4*  ALBUMIN 2.3*   No results for input(s): LIPASE, AMYLASE in the last 168 hours. No results for input(s): AMMONIA in the last 168 hours. Coagulation Profile: No results for input(s): INR, PROTIME in the last 168 hours. Cardiac Enzymes: No results for input(s): CKTOTAL, CKMB, CKMBINDEX, TROPONINI in the last 168 hours. ProBNP, BNP (last 5 results) Recent Labs    10/05/23 1800  BNP 147.5*   HbA1C: No results for input(s): HGBA1C in the last 72 hours. CBG: Recent Labs  Lab 12/31/23 0717 12/31/23 0758 12/31/23 0907 12/31/23 1020 12/31/23 1120  GLUCAP 224* 219* 193* 189* 145*   Lipid Profile: No results  for input(s): CHOL, HDL, LDLCALC, TRIG, CHOLHDL, LDLDIRECT in the last 72 hours. Thyroid Function Tests: No results for input(s): TSH, T4TOTAL, FREET4, T3FREE, THYROIDAB in the last 72 hours. Anemia Panel: No results for input(s): VITAMINB12, FOLATE, FERRITIN, TIBC, IRON, RETICCTPCT in the last 72 hours. Sepsis Labs: No results for input(s): PROCALCITON, LATICACIDVEN in the last 168 hours.  No results found for this or any previous visit (from the past 240 hours).   Radiology Studies: No results found.  Scheduled Meds:  amLODipine   10 mg Oral Daily   aspirin  EC  81 mg Oral Q breakfast   atorvastatin   40 mg Oral QHS   cyclobenzaprine  10 mg Oral BID   [START ON 01/01/2024] enoxaparin  (LOVENOX ) injection  30 mg Subcutaneous Q24H   fluticasone  furoate-vilanterol  1 puff Inhalation Daily   gabapentin   300 mg Oral TID   insulin  aspart  0-5 Units Subcutaneous QHS   insulin  aspart  0-6 Units Subcutaneous TID WC   insulin  glargine-yfgn  10 Units Subcutaneous QHS   metoprolol  succinate  12.5 mg Oral Daily   montelukast   10 mg Oral QHS   pantoprazole   40 mg Oral Daily   Continuous Infusions:  lactated ringers 125 mL/hr at 12/31/23 1100     LOS: 0 days   Time spent: 40 minutes  Casimer Dare, MD  [facility]  12/31/2023, 12:28 PM

## 2023-12-31 NOTE — ED Notes (Signed)
 Semglee  given at 1000, will d/c insulin  drip and D5LR at 1100.

## 2023-12-31 NOTE — H&P (Addendum)
 [place]   PATIENT NAME: Sara [name]    MR#:  [id number]  DATE OF BIRTH:  December 05, 1975  DATE OF ADMISSION:  12/30/2023  PRIMARY CARE PHYSICIAN: Lenon Homer, MD   Patient is coming from: Home  REQUESTING/REFERRING PHYSICIAN: Palumbo, April, MD   CHIEF COMPLAINT:   Chief Complaint  Patient presents with  . Psychiatric Evaluation       . Hyperglycemia  . Hypertension    Pt non compliant with meds and having SI behavior issues at a group home.     HISTORY OF PRESENT ILLNESS:  Sara [name] is a 48 y.o. African-American female with medical history significant for GERD, depression, type 2 diabetes mellitus, H. pylori infection, hypertension, dyslipidemia and OSA, who presented to the emergency room with acute onset of psychiatric evaluation for suicidal ideation and hyperglycemia.  The patient admitted to polydipsia without significant polyuria.  No chest pain or palpitations.  No cough or wheezing or dyspnea.  No dysuria, oliguria or hematuria or flank pain.  ED Course: He came to the ER, BP was 167/92 with heart rate of 117 respirate of 22 on otherwise vital signs were within normal.  Labs revealed blood glucose of 534 and a BUN of 28 with creatinine 3.04 and calcium  7.9 with albumin of 2.3 and total protein 5.4.  CBC showed leukocytosis 12.3 and hemoglobin 8.1 hematocrit 25.  Blood glucose level was 498.  UA was remarkable for 21-50 RBCs with rare bacteria and more than 300 protein and more than 500 glucose. EKG as reviewed by me : .  Twelve-lead EKG showed sinus tachycardia with a rate of 107 Imaging: None.  The patient was started on IV insulin  drip per Endo tool and was given 1 L bolus of IV normal saline.  She will be admitted to a progressive unit bed for further evaluation and management. PAST MEDICAL HISTORY:   Past Medical History:  Diagnosis Date  . Acid reflux   . Chronic pain   . Depression   . 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 Fatty liver   . Gastritis 2010  . Gastritis   . H. pylori infection   . High risk medication use   . Hypercholesterolemia   . Hyperlipidemia, mixed   . Hypertension   . Other mixed anxiety disorders   . Pollen allergies   . Sickle cell trait   . Sleep apnea   . Vitamin D deficiency     PAST SURGICAL HISTORY:   Past Surgical History:  Procedure Laterality Date  . ABDOMINAL HYSTERECTOMY    . APPENDECTOMY    . COLONOSCOPY  08-16-2008   Dr. Princella  . ESOPHAGOGASTRODUODENOSCOPY  08-16-2008   Dr. Anette     SOCIAL HISTORY:   Social History   Tobacco Use  . Smoking status: Every Day    Current packs/day: 1.00    Types: Cigarettes  . Smokeless tobacco: Never  . Tobacco comments:    Will order nicotine  gum  Substance Use Topics  . Alcohol use: Not Currently    FAMILY HISTORY:   Family History  Problem Relation Age of Onset  . Clotting disorder Mother   . Crohn's disease Mother   . Heart disease Mother   . Colon cancer Father   . Clotting disorder Brother   . Diabetes Brother   . Diabetes Maternal Aunt   . Liver cancer Maternal Uncle   . Prostate cancer Maternal Uncle   .  Colon cancer Maternal Uncle   . Diabetes Paternal Aunt   . Esophageal cancer Paternal Uncle     DRUG ALLERGIES:   Allergies  Allergen Reactions  . Amoxicillin Anaphylaxis and Rash  . Biaxin  [Clarithromycin ] Itching and Rash  . Penicillins Anaphylaxis and Rash    Received ceftriaxone  in 2022 with no issues  . Tetracyclines & Related Anaphylaxis  . Vibramycin [Doxycycline] Anaphylaxis and Rash  . Flagyl  [Metronidazole ] Itching and Rash  . Red Dye #40 (Allura Red) Itching    REVIEW OF SYSTEMS:   ROS As per history of present illness. All pertinent systems were reviewed above. Constitutional, HEENT, cardiovascular, respiratory, GI, GU, musculoskeletal, neuro, psychiatric, endocrine, integumentary and hematologic systems were reviewed and are otherwise  negative/unremarkable except for positive findings mentioned above in the HPI.   MEDICATIONS AT HOME:   Prior to Admission medications   Medication Sig Start Date End Date Taking? Authorizing Provider  acetaminophen  (TYLENOL ) 500 MG tablet Take 500-1,000 mg by mouth every 8 (eight) hours as needed for moderate pain (pain score 4-6), headache or fever.    [provider]  albuterol  (VENTOLIN  HFA) 108 (90 Base) MCG/ACT inhaler Inhale 2 puffs into the lungs every 6 (six) hours as needed for wheezing or shortness of breath. 10/09/23   Pearlean Manus, MD  amLODipine  (NORVASC ) 10 MG tablet Take 1 tablet (10 mg total) by mouth daily. 10/10/23   Pearlean Manus, MD  aspirin  EC 81 MG tablet Take 1 tablet (81 mg total) by mouth daily with breakfast. Swallow whole. 10/09/23   Pearlean Manus, MD  atorvastatin  (LIPITOR) 40 MG tablet Take 1 tablet (40 mg total) by mouth daily. Patient taking differently: Take 40 mg by mouth at bedtime. 01/12/23   Remi Pippin, NP  cyclobenzaprine (FLEXERIL) 10 MG tablet Take 10 mg by mouth 2 (two) times daily. 08/12/23   [provider]  fluticasone  furoate-vilanterol (BREO ELLIPTA ) 100-25 MCG/ACT AEPB Inhale 1 puff into the lungs daily. 10/10/23   Pearlean Manus, MD  gabapentin  (NEURONTIN ) 300 MG capsule Take 2 capsules (600 mg total) by mouth 3 (three) times daily. 10/21/22 12/05/23  Ntuen, Tina C, FNP  insulin  aspart (NOVOLOG ) 100 UNIT/ML injection Inject 0-15 Units into the skin 3 (three) times daily with meals. Patient not taking: Reported on 10/06/2023 10/21/22   Ntuen, Tina C, FNP  insulin  glargine-yfgn (SEMGLEE , YFGN,) 100 UNIT/ML injection Inject 14 Units into the skin daily. Patient not taking: Reported on 10/06/2023    [provider]  lisinopril  (ZESTRIL ) 20 MG tablet Take 1 tablet (20 mg total) by mouth daily. 10/09/23   Pearlean Manus, MD  metoprolol  succinate (TOPROL -XL) 25 MG 24 hr tablet Take 0.5 tablets (12.5 mg total) by mouth daily.  10/09/23   Pearlean Manus, MD  montelukast  (SINGULAIR ) 10 MG tablet Take 1 tablet (10 mg total) by mouth at bedtime. 10/09/23   Pearlean Manus, MD  omeprazole  (PRILOSEC) 40 MG capsule Take 1 capsule (40 mg total) by mouth daily. 10/09/23   Pearlean Manus, MD  senna-docusate (COLACE 2-IN-1) 8.6-50 MG tablet Take 1 tablet by mouth daily as needed (constipation).    [provider]      VITAL SIGNS:  Blood pressure (!) 167/92, pulse (!) 117, temperature 97.8 F (36.6 C), resp. rate (!) 22, height 5' 8 (1.727 m), weight 90.7 kg, SpO2 100%.  PHYSICAL EXAMINATION:  Physical Exam  GENERAL:  48 y.o.-year-old African-American patient lying in the bed with no acute distress.  EYES: Pupils equal, round, reactive to  light and accommodation. No scleral icterus. Extraocular muscles intact.  HEENT: Head atraumatic, normocephalic. Oropharynx and nasopharynx clear.  NECK:  Supple, no jugular venous distention. No thyroid enlargement, no tenderness.  LUNGS: Normal breath sounds bilaterally, no wheezing, rales,rhonchi or crepitation. No use of accessory muscles of respiration.  CARDIOVASCULAR: Regular rate and rhythm, S1, S2 normal. No murmurs, rubs, or gallops.  ABDOMEN: Soft, nondistended, nontender. Bowel sounds present. No organomegaly or mass.  EXTREMITIES: No pedal edema, cyanosis, or clubbing.  NEUROLOGIC: Cranial nerves II through XII are intact. Muscle strength 5/5 in all extremities. Sensation intact. Gait not checked.  PSYCHIATRIC: The patient is alert and oriented x 3.  Normal affect and good eye contact. SKIN: No obvious rash, lesion, or ulcer.   LABORATORY PANEL:   CBC Recent Labs  Lab 12/31/23 0021 12/31/23 0103  WBC 12.3*  --   HGB 8.1* 8.2*  HCT 25.0* 24.0*  PLT 173  --    ------------------------------------------------------------------------------------------------------------------  Chemistries  Recent Labs  Lab 12/31/23 0021 12/31/23 0103  NA 137 140  K  4.4 4.4  CL 109 109  CO2 21*  --   GLUCOSE 534* 498*  BUN 28* 28*  CREATININE 3.04* 3.10*  CALCIUM  7.9*  --   AST 22  --   ALT 31  --   ALKPHOS 88  --   BILITOT 0.7  --    ------------------------------------------------------------------------------------------------------------------  Cardiac Enzymes No results for input(s): TROPONINI in the last 168 hours. ------------------------------------------------------------------------------------------------------------------  RADIOLOGY:  No results found.    IMPRESSION AND PLAN:  Assessment and Plan: * Type 2 diabetes mellitus with hyperosmolar nonketotic hyperglycemia (HCC) - The patient will be admitted to a stepdown bed. - We will continue the on IV insulin  drip per EndoTool NKH protocol. - The patient will be aggressively hydrated with IV normal saline. - Will follow serial BMPs.   Acute kidney injury superimposed on CKD - This is likely hypovolemic.  She will be placed on IV fluid hydration per Endo tool. - Will follow renal functions.  GERD without esophagitis - We will continue PPI therapy.  Dyslipidemia - Will continue statin therapy.  Essential hypertension - Will continue antihypertensive therapy.    DVT prophylaxis: Lovenox .  Advanced Care Planning:  Code Status: This was discussed with her.  She desires to be DNR only.  She is agreeable with intubation. Family Communication:  The plan of care was discussed in details with the patient (and family). I answered all questions. The patient agreed to proceed with the above mentioned plan. Further management will depend upon hospital course. Disposition Plan: Back to previous home environment Consults called: none.  All the records are reviewed and case discussed with ED provider.  Status is: Inpatient  At the time of the admission, it appears that the appropriate admission status for this patient is inpatient.  This is judged to be reasonable and  necessary in order to provide the required intensity of service to ensure the patient's safety given the presenting symptoms, physical exam findings and initial radiographic and laboratory data in the context of comorbid conditions.  The patient requires inpatient status due to high intensity of service, high risk of further deterioration and high frequency of surveillance required.  I certify that at the time of admission, it is my clinical judgment that the patient will require inpatient hospital care extending more than 2 midnights.  Dispo: The patient is from: Home              Anticipated d/c is to: Home              Patient currently is not medically stable to d/c.              Difficult to place patient: No  Madison DELENA Peaches M.D on 12/31/2023 at 5:57 AM  [facility]   From 7 PM-7 AM, contact night-coverage [email or web address]  CC: Primary care physician; Lenon Homer, MD

## 2023-12-31 NOTE — Assessment & Plan Note (Signed)
-   The patient will be admitted to a stepdown bed. - We will continue the on IV insulin drip per EndoTool NKH protocol. - The patient will be aggressively hydrated with IV normal saline. - Will follow serial BMPs.

## 2023-12-31 NOTE — Assessment & Plan Note (Signed)
 -  We will continue PPI therapy

## 2023-12-31 NOTE — Inpatient Diabetes Management (Signed)
 Inpatient Diabetes Program Recommendations  AACE/ADA: New Consensus Statement on Inpatient Glycemic Control (2015)  Target Ranges:  Prepandial:   less than 140 mg/dL      Peak postprandial:   less than 180 mg/dL (1-2 hours)      Critically ill patients:  140 - 180 mg/dL   Lab Results  Component Value Date   GLUCAP 219 (H) 12/31/2023   HGBA1C 8.6 (H) 10/06/2023    Review of Glycemic Control  Latest Reference Range & Units 12/31/23 05:57 12/31/23 07:17 12/31/23 07:58  Glucose-Capillary 70 - 99 mg/dL 840 (H) 775 (H) 780 (H)   Diabetes history: DM 2 Outpatient Diabetes medications:  reported not on insulin  due to vision barrier prescribed lantus  15 units, Novolog  SSI Current orders for Inpatient glycemic control:  IV insulin /Endotool/DKA  A1c 8.6% on 9/9 Hgb 8.7 at that time likely not accurate  Inpatient Diabetes Program Recommendations:    At time of transition consider: -   Semglee  10 units Q24 units -   Novolog  0-6 units tid + hs (elevated renal function)  For discharge may consider CGM/ insulin  pens where you can hear the clicks of the insulin  pen for dosing and have a bluetooth reading for glucose trends if phone is compatible.  Will follow pt while here.  Thanks,  Clotilda Bull RN, MSN, BC-ADM Inpatient Diabetes Coordinator Team Pager [phone] (8a-5p)

## 2023-12-31 NOTE — Assessment & Plan Note (Signed)
-   This is likely hypovolemic.  She will be placed on IV fluid hydration per Endo tool. - Will follow renal functions.

## 2023-12-31 NOTE — Assessment & Plan Note (Signed)
 Will continue statin therapy

## 2023-12-31 NOTE — ED Provider Notes (Signed)
 [place] EMERGENCY DEPARTMENT AT [facility] Provider Note   CSN: [id number] Arrival date & time: 12/30/23  2355     Patient presents with: Psychiatric Evaluation (/), Hyperglycemia, and Hypertension (Pt non compliant with meds and having SI behavior issues at a group home./)   Sara [name] is a 48 y.o. female.   The history is provided by the patient.  Hyperglycemia Blood sugar level PTA:  High Severity:  Severe Onset quality:  Gradual Timing:  Constant Progression:  Worsening Chronicity:  Chronic Diabetes status:  Controlled with insulin  Time since last antidiabetic medication:  3 months Context: noncompliance   Relieved by:  Nothing Ineffective treatments:  None tried Associated symptoms: increased thirst   Associated symptoms: no fever, no polyuria, no syncope, no vomiting, no weakness and no weight change   Risk factors: no obesity   Patient with SI and non compliance with insulin  and all medications, also increased thirst.       Prior to Admission medications   Medication Sig Start Date End Date Taking? Authorizing Provider  acetaminophen  (TYLENOL ) 650 MG CR tablet Take 650-1,300 mg by mouth daily as needed for pain.   Yes [provider]  ADVAIR DISKUS 250-50 MCG/ACT AEPB Inhale 1 puff into the lungs in the morning and at bedtime. 11/28/23  Yes [provider]  albuterol  (VENTOLIN  HFA) 108 (90 Base) MCG/ACT inhaler Inhale 2 puffs into the lungs every 6 (six) hours as needed for wheezing or shortness of breath. 10/09/23  Yes Emokpae, Courage, MD  amLODipine  (NORVASC ) 10 MG tablet Take 1 tablet (10 mg total) by mouth daily. 10/10/23  Yes Emokpae, Courage, MD  atorvastatin  (LIPITOR) 40 MG tablet Take 1 tablet (40 mg total) by mouth daily. Patient taking differently: Take 40 mg by mouth at bedtime. 01/12/23  Yes Shafer, Jorene, NP  bisacodyl 5 MG EC tablet Take 5 mg by mouth daily as needed for mild constipation.   Yes [provider]   gabapentin  (NEURONTIN ) 300 MG capsule Take 2 capsules (600 mg total) by mouth 3 (three) times daily. Patient taking differently: Take 600 mg by mouth 2 (two) times daily as needed (for diabetic neuropathy). 10/21/22 12/31/23 Yes Ntuen, Ellouise BROCKS, FNP  ipratropium-albuterol  (DUONEB) 0.5-2.5 (3) MG/3ML SOLN Take 3 mLs by nebulization every 6 (six) hours as needed. 11/27/23  Yes [provider]  metoprolol  succinate (TOPROL -XL) 25 MG 24 hr tablet Take 0.5 tablets (12.5 mg total) by mouth daily. 10/09/23  Yes Emokpae, Courage, MD  montelukast  (SINGULAIR ) 10 MG tablet Take 1 tablet (10 mg total) by mouth at bedtime. 10/09/23  Yes Emokpae, Courage, MD  omeprazole  (PRILOSEC) 40 MG capsule Take 1 capsule (40 mg total) by mouth daily. 10/09/23  Yes Emokpae, Courage, MD  ondansetron  (ZOFRAN -ODT) 4 MG disintegrating tablet Take 4 mg by mouth every 6 (six) hours as needed. 12/06/23  Yes [provider]  aspirin  EC 81 MG tablet Take 1 tablet (81 mg total) by mouth daily with breakfast. Swallow whole. Patient not taking: Reported on 12/31/2023 10/09/23   Pearlean Manus, MD  azithromycin  (ZITHROMAX ) 500 MG tablet Take 500 mg by mouth daily. Patient not taking: Reported on 12/31/2023 12/21/23   [provider]  cyclobenzaprine (FLEXERIL) 10 MG tablet Take 10 mg by mouth 2 (two) times daily. Patient not taking: Reported on 12/31/2023 08/12/23   [provider]  fluticasone  furoate-vilanterol (BREO ELLIPTA ) 100-25 MCG/ACT AEPB Inhale 1 puff into the lungs daily. Patient not taking: Reported on 12/31/2023 10/10/23  Pearlean Manus, MD  insulin  aspart (NOVOLOG ) 100 UNIT/ML injection Inject 0-15 Units into the skin 3 (three) times daily with meals. Patient not taking: Reported on 12/31/2023 10/21/22   Raye Ellouise BROCKS, FNP  LANTUS  SOLOSTAR 100 UNIT/ML Solostar Pen Inject 15 Units into the skin at bedtime. Patient not taking: Reported on 12/31/2023 11/30/23   [provider]  lisinopril   (ZESTRIL ) 20 MG tablet Take 1 tablet (20 mg total) by mouth daily. Patient not taking: Reported on 12/31/2023 10/09/23   Pearlean Manus, MD  senna-docusate (COLACE 2-IN-1) 8.6-50 MG tablet Take 1 tablet by mouth daily as needed (constipation). Patient not taking: Reported on 12/31/2023    [provider]    Allergies: Amoxicillin, Biaxin  [clarithromycin ], Penicillins, Tetracyclines & related, Vibramycin [doxycycline], Flagyl  [metronidazole ], and Red dye #40 (allura red)    Review of Systems  Constitutional:  Negative for fever.  Respiratory:  Negative for wheezing and stridor.   Cardiovascular:  Negative for syncope.  Gastrointestinal:  Negative for vomiting.  Endocrine: Positive for polydipsia. Negative for polyuria.  Neurological:  Negative for weakness.  Psychiatric/Behavioral:  Negative for self-injury and sleep disturbance. The patient is not nervous/anxious.   All other systems reviewed and are negative.   Updated Vital Signs BP (!) 167/92 (BP Location: Right Arm)   Pulse (!) 117   Temp 98.4 F (36.9 C) (Oral)   Resp (!) 22   Ht 5' 8 (1.727 m)   Wt 90.7 kg Comment: Wt from 12/29/2023  SpO2 100%   BMI 30.40 kg/m   Physical Exam Vitals and nursing note reviewed.  Constitutional:      General: She is not in acute distress.    Appearance: Normal appearance. She is well-developed.  HENT:     Head: Normocephalic and atraumatic.     Nose: Nose normal.  Eyes:     Pupils: Pupils are equal, round, and reactive to light.  Cardiovascular:     Rate and Rhythm: Regular rhythm. Tachycardia present.     Pulses: Normal pulses.     Heart sounds: Normal heart sounds.  Pulmonary:     Effort: Pulmonary effort is normal. No respiratory distress.     Breath sounds: Normal breath sounds.  Abdominal:     General: Bowel sounds are normal. There is no distension.     Palpations: Abdomen is soft.     Tenderness: There is no abdominal tenderness. There is no guarding or rebound.   Musculoskeletal:        General: Normal range of motion.     Cervical back: Normal range of motion and neck supple.  Skin:    General: Skin is dry.     Capillary Refill: Capillary refill takes less than 2 seconds.     Findings: No erythema or rash.  Neurological:     General: No focal deficit present.     Mental Status: She is alert.     Deep Tendon Reflexes: Reflexes normal.  Psychiatric:        Mood and Affect: Mood normal.     (all labs ordered are listed, but only abnormal results are displayed) Results for orders placed or performed during the hospital encounter of 12/30/23  CBG monitoring, ED   Collection Time: 12/30/23 11:59 PM  Result Value Ref Range   Glucose-Capillary 508 (HH) 70 - 99 mg/dL   Comment 1 Notify RN   Urinalysis, Routine w reflex microscopic -Urine, Clean Catch   Collection Time: 12/31/23 12:01 AM  Result Value Ref Range  Color, Urine STRAW (A) YELLOW   APPearance CLEAR CLEAR   Specific Gravity, Urine 1.015 1.005 - 1.030   pH 7.0 5.0 - 8.0   Glucose, UA >=500 (A) NEGATIVE mg/dL   Hgb urine dipstick MODERATE (A) NEGATIVE   Bilirubin Urine NEGATIVE NEGATIVE   Ketones, ur NEGATIVE NEGATIVE mg/dL   Protein, ur >=699 (A) NEGATIVE mg/dL   Nitrite NEGATIVE NEGATIVE   Leukocytes,Ua NEGATIVE NEGATIVE   RBC / HPF 21-50 0 - 5 RBC/hpf   WBC, UA 0-5 0 - 5 WBC/hpf   Bacteria, UA RARE (A) NONE SEEN   Squamous Epithelial / HPF 0-5 0 - 5 /HPF   Mucus PRESENT    Hyaline Casts, UA PRESENT   CBC with Differential   Collection Time: 12/31/23 12:21 AM  Result Value Ref Range   WBC 12.3 (H) 4.0 - 10.5 K/uL   RBC 2.99 (L) 3.87 - 5.11 MIL/uL   Hemoglobin 8.1 (L) 12.0 - 15.0 g/dL   HCT 74.9 (L) 63.9 - 53.9 %   MCV 83.6 80.0 - 100.0 fL   MCH 27.1 26.0 - 34.0 pg   MCHC 32.4 30.0 - 36.0 g/dL   RDW 85.5 88.4 - 84.4 %   Platelets 173 150 - 400 K/uL   nRBC 0.0 0.0 - 0.2 %   Neutrophils Relative % 74 %   Neutro Abs 9.1 (H) 1.7 - 7.7 K/uL   Lymphocytes Relative 17 %    Lymphs Abs 2.1 0.7 - 4.0 K/uL   Monocytes Relative 7 %   Monocytes Absolute 0.9 0.1 - 1.0 K/uL   Eosinophils Relative 1 %   Eosinophils Absolute 0.1 0.0 - 0.5 K/uL   Basophils Relative 0 %   Basophils Absolute 0.1 0.0 - 0.1 K/uL   Immature Granulocytes 1 %   Abs Immature Granulocytes 0.09 (H) 0.00 - 0.07 K/uL  Comprehensive metabolic panel   Collection Time: 12/31/23 12:21 AM  Result Value Ref Range   Sodium 137 135 - 145 mmol/L   Potassium 4.4 3.5 - 5.1 mmol/L   Chloride 109 98 - 111 mmol/L   CO2 21 (L) 22 - 32 mmol/L   Glucose, Bld 534 (HH) 70 - 99 mg/dL   BUN 28 (H) 6 - 20 mg/dL   Creatinine, Ser 6.95 (H) 0.44 - 1.00 mg/dL   Calcium  7.9 (L) 8.9 - 10.3 mg/dL   Total Protein 5.4 (L) 6.5 - 8.1 g/dL   Albumin 2.3 (L) 3.5 - 5.0 g/dL   AST 22 15 - 41 U/L   ALT 31 0 - 44 U/L   Alkaline Phosphatase 88 38 - 126 U/L   Total Bilirubin 0.7 0.0 - 1.2 mg/dL   GFR, Estimated 18 (L) >60 mL/min   Anion gap 7 5 - 15  I-stat chem 8, ED (not at [facility], [facility] or [facility])   Collection Time: 12/31/23  1:03 AM  Result Value Ref Range   Sodium 140 135 - 145 mmol/L   Potassium 4.4 3.5 - 5.1 mmol/L   Chloride 109 98 - 111 mmol/L   BUN 28 (H) 6 - 20 mg/dL   Creatinine, Ser 6.89 (H) 0.44 - 1.00 mg/dL   Glucose, Bld 501 (H) 70 - 99 mg/dL   Calcium , Ion 1.14 (L) 1.15 - 1.40 mmol/L   TCO2 18 (L) 22 - 32 mmol/L   Hemoglobin 8.2 (L) 12.0 - 15.0 g/dL   HCT 75.9 (L) 63.9 - 53.9 %  CBG monitoring, ED   Collection Time: 12/31/23  3:08 AM  Result Value Ref Range   Glucose-Capillary 287 (H) 70 - 99 mg/dL   No results found.  EKG: EKG Interpretation Date/Time:  Thursday December 31 2023 00:53:39 EST Ventricular Rate:  107 PR Interval:  107 QRS Duration:  85 QT Interval:  333 QTC Calculation: 445 R Axis:   71  Text Interpretation: Sinus tachycardia Confirmed by Charlyn Vialpando ([id number]) on 12/31/2023 1:04:23 AM  Radiology: No results found.   .Critical Care  Performed by: Nettie Earing,  MD Authorized by: Nettie Earing, MD   Critical care provider statement:    Critical care time (minutes):  30   Critical care end time:  12/31/2023 4:38 AM   Critical care was necessary to treat or prevent imminent or life-threatening deterioration of the following conditions:  Renal failure and endocrine crisis   Critical care was time spent personally by me on the following activities:  Development of treatment plan with patient or surrogate, discussions with consultants, evaluation of patient's response to treatment, examination of patient, ordering and review of laboratory studies, ordering and review of radiographic studies, ordering and performing treatments and interventions, pulse oximetry, re-evaluation of patient's condition and review of old charts   I assumed direction of critical care for this patient from another provider in my specialty: no     Care discussed with: admitting provider      Medications Ordered in the ED  0.9 %  sodium chloride  infusion ( Intravenous New Bag/Given 12/31/23 0224)  aspirin  EC tablet 81 mg (has no administration in time range)  amLODipine  (NORVASC ) tablet 10 mg (has no administration in time range)  atorvastatin  (LIPITOR) tablet 40 mg (has no administration in time range)  metoprolol  succinate (TOPROL -XL) 24 hr tablet 12.5 mg (has no administration in time range)  pantoprazole  (PROTONIX ) EC tablet 40 mg (has no administration in time range)  senna-docusate (Senokot-S) tablet 1 tablet (has no administration in time range)  cyclobenzaprine (FLEXERIL) tablet 10 mg (has no administration in time range)  gabapentin  (NEURONTIN ) capsule 300 mg (has no administration in time range)  albuterol  (PROVENTIL ) (2.5 MG/3ML) 0.083% nebulizer solution 3 mL (has no administration in time range)  fluticasone  furoate-vilanterol (BREO ELLIPTA ) 100-25 MCG/ACT 1 puff (has no administration in time range)  montelukast  (SINGULAIR ) tablet 10 mg (has no administration in time  range)  insulin  regular, human (MYXREDLIN) 100 units/ 100 mL infusion (3.2 Units/hr Intravenous New Bag/Given 12/31/23 0328)  lactated ringers infusion (has no administration in time range)  dextrose 5 % in lactated ringers infusion (has no administration in time range)  dextrose 50 % solution 0-50 mL (has no administration in time range)  acetaminophen  (TYLENOL ) tablet 650 mg (has no administration in time range)    Or  acetaminophen  (TYLENOL ) suppository 650 mg (has no administration in time range)  traZODone  (DESYREL ) tablet 25 mg (has no administration in time range)  magnesium  hydroxide (MILK OF MAGNESIA) suspension 30 mL (has no administration in time range)  ondansetron  (ZOFRAN ) tablet 4 mg ( Oral See Alternative 12/31/23 0327)    Or  ondansetron  (ZOFRAN ) injection 4 mg (4 mg Intravenous Given 12/31/23 0327)  enoxaparin  (LOVENOX ) injection 30 mg (has no administration in time range)  cyclobenzaprine (FLEXERIL) tablet 5 mg (5 mg Oral Given 12/31/23 0326)  sodium chloride  0.9 % bolus 1,000 mL (1,000 mLs Intravenous New Bag/Given 12/31/23 0109)  Medical Decision Making Patient off all DM medications   Amount and/or Complexity of Data Reviewed Independent Historian: EMS    Details: See above  External Data Reviewed: notes.    Details: Previous notes  Labs: ordered.    Details: White count slight elevation 12.3, hemoglobin low 8.1, normal platelets normal sodium 137, normal potassium elevated elevated BUN  creatinine 3.04  ECG/medicine tests: ordered and independent interpretation performed. Decision-making details documented in ED Course.  Risk Prescription drug management. Decision regarding hospitalization.    Final diagnoses:  None   The patient appears reasonably stabilized for admission considering the current resources, flow, and capabilities available in the ED at this time, and I doubt any other [facility] requiring further screening and/or  treatment in the ED prior to admission.  ED Discharge Orders     None          Ayeden Gladman, MD 12/31/23 [phone]

## 2024-01-01 ENCOUNTER — Other Ambulatory Visit (HOSPITAL_COMMUNITY): Payer: Self-pay

## 2024-01-01 ENCOUNTER — Encounter (HOSPITAL_COMMUNITY): Payer: Self-pay

## 2024-01-01 ENCOUNTER — Inpatient Hospital Stay (HOSPITAL_COMMUNITY): Payer: MEDICAID

## 2024-01-01 DIAGNOSIS — E1100 Type 2 diabetes mellitus with hyperosmolarity without nonketotic hyperglycemic-hyperosmolar coma (NKHHC): Secondary | ICD-10-CM | POA: Diagnosis not present

## 2024-01-01 LAB — URINALYSIS, ROUTINE W REFLEX MICROSCOPIC
Bilirubin Urine: NEGATIVE
Glucose, UA: >=500 mg/dL — AB
Ketones, ur: NEGATIVE mg/dL
Leukocytes,Ua: NEGATIVE
Nitrite: NEGATIVE
Protein, ur: >=300 mg/dL — AB
Specific Gravity, Urine: 1.011 (ref 1.005–1.030)
pH: 6 (ref 5.0–8.0)

## 2024-01-01 LAB — PROTEIN / CREATININE RATIO, URINE
Creatinine, Urine: 90 mg/dL
Protein Creatinine Ratio: 6.22 mg/mg{creat} — ABNORMAL HIGH (ref 0.00–0.15)
Total Protein, Urine: 560 mg/dL

## 2024-01-01 LAB — BASIC METABOLIC PANEL WITH GFR
Anion gap: 7 (ref 5–15)
BUN: 28 mg/dL — ABNORMAL HIGH (ref 6–20)
CO2: 20 mmol/L — ABNORMAL LOW (ref 22–32)
Calcium: 7.7 mg/dL — ABNORMAL LOW (ref 8.9–10.3)
Chloride: 112 mmol/L — ABNORMAL HIGH (ref 98–111)
Creatinine, Ser: 3.1 mg/dL — ABNORMAL HIGH (ref 0.44–1.00)
GFR, Estimated: 18 mL/min — ABNORMAL LOW (ref >60)
Glucose, Bld: 226 mg/dL — ABNORMAL HIGH (ref 70–99)
Potassium: 4.5 mmol/L (ref 3.5–5.1)
Sodium: 139 mmol/L (ref 135–145)

## 2024-01-01 LAB — OSMOLALITY: Osmolality: 304 mosm/kg — ABNORMAL HIGH (ref 275–295)

## 2024-01-01 LAB — CREATININE, URINE, RANDOM: Creatinine, Urine: 90 mg/dL

## 2024-01-01 LAB — GLUCOSE, CAPILLARY
Glucose-Capillary: 218 mg/dL — ABNORMAL HIGH (ref 70–99)
Glucose-Capillary: 195 mg/dL — ABNORMAL HIGH (ref 70–99)
Glucose-Capillary: 191 mg/dL — ABNORMAL HIGH (ref 70–99)
Glucose-Capillary: 252 mg/dL — ABNORMAL HIGH (ref 70–99)

## 2024-01-01 LAB — SODIUM, URINE, RANDOM: Sodium, Ur: 49 mmol/L

## 2024-01-01 LAB — URIC ACID: Uric Acid, Serum: 5.4 mg/dL (ref 2.5–7.1)

## 2024-01-01 LAB — OSMOLALITY, URINE: Osmolality, Ur: 338 mosm/kg (ref 300–900)

## 2024-01-01 MED ORDER — SODIUM BICARBONATE 650 MG PO TABS
650.0000 mg | ORAL_TABLET | Freq: Two times a day (BID) | ORAL | Status: DC
  Administered 2024-01-01 – 2024-01-22 (×40): 650 mg via ORAL
  Filled 2024-01-01 (×42): qty 1

## 2024-01-01 MED ORDER — OXYCODONE HCL 5 MG PO TABS
5.0000 mg | ORAL_TABLET | Freq: Once | ORAL | Status: AC | PRN
  Administered 2024-01-01: 5 mg via ORAL
  Filled 2024-01-01: qty 1

## 2024-01-01 MED ORDER — FUROSEMIDE 10 MG/ML IJ SOLN
80.0000 mg | Freq: Once | INTRAMUSCULAR | Status: AC
  Administered 2024-01-01: 80 mg via INTRAVENOUS
  Filled 2024-01-01: qty 8

## 2024-01-01 MED ORDER — LACTATED RINGERS IV SOLN: INTRAVENOUS | Status: DC

## 2024-01-01 NOTE — Plan of Care (Signed)

## 2024-01-01 NOTE — Inpatient Diabetes Management (Signed)
 Inpatient Diabetes Program Recommendations  AACE/ADA: New Consensus Statement on Inpatient Glycemic Control (2015)  Target Ranges:  Prepandial:   less than 140 mg/dL      Peak postprandial:   less than 180 mg/dL (1-2 hours)      Critically ill patients:  140 - 180 mg/dL   Lab Results  Component Value Date   GLUCAP 195 (H) 01/01/2024   HGBA1C 8.6 (H) 10/06/2023    Review of Glycemic Control  Latest Reference Range & Units 12/31/23 10:20 12/31/23 11:20 12/31/23 15:53 12/31/23 21:20 01/01/24 07:26 01/01/24 11:49  Glucose-Capillary 70 - 99 mg/dL 810 (H) 854 (H) 714 (H) 243 (H) 218 (H) 195 (H)   Diabetes history: DM 2 Outpatient Diabetes medications:  Novolog  0-15 units tid with meals Lantus  15 units q HS Current orders for Inpatient glycemic control:  Novolog  0-6 units tid with meals and HS Semglee  10 units q HS  Inpatient Diabetes Program Recommendations:     Spoke with patient and she states that she has plenty of insulin  however her rapid acting insulin  is in a vial and it is difficult for her to draw it up.  Requested benefits check for Novolog  insulin  pen and it is $4.  She also needs orders for Dexcom sensors ($0 co-pay).  She states that this will help her a lot.  Discussed importance of control of blood sugars.    Thanks,  Randall Bullocks, RN, BC-ADM Inpatient Diabetes Coordinator Pager [phone]  (8a-5p)
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
## 2024-01-01 NOTE — Progress Notes (Signed)
 PROGRESS NOTE     Patient Demographics:    Sara [name], is a 48 y.o. female, DOB - [birth year], FMW:[id number]  Outpatient Primary MD for the patient is Lenon Homer, MD    LOS - 1  Admit date - 12/30/2023    Chief Complaint  Patient presents with   Psychiatric Evaluation        Hyperglycemia   Hypertension    Pt non compliant with meds and having SI behavior issues at a group home.        Brief Narrative (HPI from H&P)   48 y.o. African-American female with medical history significant for GERD, depression, type 2 diabetes mellitus, H. pylori infection, hypertension, dyslipidemia and OSA, who presented to the emergency room with acute onset of psychiatric evaluation for suicidal ideation and hyperglycemia.  The patient admitted to polydipsia without significant polyuria.  No chest pain or palpitations.  No cough or wheezing or dyspnea.  No dysuria, oliguria or hematuria or flank pain.  In the ER was diagnosed with nonketotic hyperosmolar state from not taking her insulin  for a while, depression, suicidal ideation and she was admitted to the hospital.   Subjective:    Sara [name] today has, No headache, No chest pain, No abdominal pain - No Nausea, No new weakness tingling or numbness, no shortness of breath, feels depressed.   Assessment  & Plan :   Type 2 diabetes mellitus with hyperosmolar nonketotic hyperglycemia (HCC) - Due to noncompliance with insulin , was depressed and not taking it for several days, initially on IV insulin  drip now transition to subcu, counseled on compliance, monitor.  Lab Results  Component Value Date   HGBA1C 8.6 (H) 10/06/2023   CBG (last 3)  Recent Labs    12/31/23 1553  12/31/23 2120 01/01/24 0726  GLUCAP 285* 243* 218*       Acute kidney injury superimposed on CKD stage IIIb - Clinically dehydrated however has 1+ edema which is chronic, likely developing diabetic nephropathy from poorly controlled diabetes mellitus, obtain UA, urine electrolytes, protein creatinine ratio, renal ultrasound and nephrology input.   GERD without  esophagitis - We will continue PPI therapy.   Dyslipidemia - Will continue statin therapy.   Essential hypertension - Will continue antihypertensive therapy.  Depression with suicidal ideation.  Sitter and psych eval.        Condition - Fair  Family Communication  : None present  Code Status :  Full  Consults  :  Psych, Renal  PUD Prophylaxis :  PPI   Procedures  :     Renal US  -       Disposition Plan  :    Status is: Inpatient  DVT Prophylaxis  :    enoxaparin  (LOVENOX ) injection 30 mg Start: 01/01/24 1000   Lab Results  Component Value Date   PLT 148 (L) 12/31/2023    Diet :  Diet Order             Diet heart healthy/carb modified Room service appropriate? Yes; Fluid consistency: Thin  Diet effective now                    Inpatient Medications  Scheduled Meds:  amLODipine   10 mg Oral Daily   aspirin  EC  81 mg Oral Q breakfast   atorvastatin   40 mg Oral QHS   cyclobenzaprine   10 mg Oral BID   enoxaparin  (LOVENOX ) injection  30 mg Subcutaneous Q24H   fluticasone  furoate-vilanterol  1 puff Inhalation Daily   gabapentin   300 mg Oral TID   insulin  aspart  0-5 Units Subcutaneous QHS   insulin  aspart  0-6 Units Subcutaneous TID WC   insulin  glargine-yfgn  10 Units Subcutaneous QHS   metoprolol  succinate  12.5 mg Oral Daily   montelukast   10 mg Oral QHS   pantoprazole   40 mg Oral Daily   sodium bicarbonate   650 mg Oral BID   Continuous Infusions:  lactated ringers  75 mL/hr at 01/01/24 0551   PRN Meds:.acetaminophen  **OR** acetaminophen , albuterol , cyclobenzaprine , dextrose ,  magnesium  hydroxide, ondansetron  **OR** ondansetron  (ZOFRAN ) IV, senna-docusate, traZODone   Antibiotics  :    Anti-infectives (From admission, onward)    None         Objective:   Vitals:   12/31/23 1943 01/01/24 0000 01/01/24 0441 01/01/24 0722  BP:  133/74 130/74 (!) 145/77  Pulse: 98 91 92 (!) 103  Resp: 18 20 18 19   Temp: 98.7 F (37.1 C) 98.7 F (37.1 C) 97.6 F (36.4 C) 99 F (37.2 C)  TempSrc: Oral Oral Oral Oral  SpO2:  100%  100%  Weight:      Height:        Wt Readings from Last 3 Encounters:  12/31/23 90.7 kg  10/07/23 64.5 kg  04/08/23 74.8 kg     Intake/Output Summary (Last 24 hours) at 01/01/2024 0844 Last data filed at 12/31/2023 1700 Gross per 24 hour  Intake 968.03 ml  Output --  Net 968.03 ml     Physical Exam  Awake Alert, No new F.N deficits, Normal affect [place].AT,PERRAL Supple Neck, No JVD,   Symmetrical Chest wall movement, Good air movement bilaterally, CTAB RRR,No Gallops,Rubs or new Murmurs,  +ve B.Sounds, Abd Soft, No tenderness,   1+ edema      Data Review:    Recent Labs  Lab 12/31/23 0021 12/31/23 0103 12/31/23 0624  WBC 12.3*  --  10.0  HGB 8.1* 8.2* 8.3*  HCT 25.0* 24.0* 28.1*  PLT 173  --  148*  MCV 83.6  --  90.9  MCH 27.1  --  26.9  MCHC  32.4  --  29.5*  RDW 14.4  --  14.5  LYMPHSABS 2.1  --   --   MONOABS 0.9  --   --   EOSABS 0.1  --   --   BASOSABS 0.1  --   --     Recent Labs  Lab 12/31/23 0021 12/31/23 0103 12/31/23 0624 01/01/24 0229  NA 137 140 139 139  K 4.4 4.4 4.0 4.5  CL 109 109 113* 112*  CO2 21*  --  17* 20*  ANIONGAP 7  --  9 7  GLUCOSE 534* 498* 147* 226*  BUN 28* 28* 28* 28*  CREATININE 3.04* 3.10* 2.78* 3.10*  AST 22  --   --   --   ALT 31  --   --   --   ALKPHOS 88  --   --   --   BILITOT 0.7  --   --   --   ALBUMIN 2.3*  --   --   --   CALCIUM  7.9*  --  8.1* 7.7*      Recent Labs  Lab 12/31/23 0021 12/31/23 0624 01/01/24 0229  CALCIUM  7.9* 8.1* 7.7*     --------------------------------------------------------------------------------------------------------------- Lab Results  Component Value Date   CHOL 215 (H) 01/10/2023   HDL 51 01/10/2023   LDLCALC 148 (H) 01/10/2023   TRIG 79 01/10/2023   CHOLHDL 4.2 01/10/2023    Lab Results  Component Value Date   HGBA1C 8.6 (H) 10/06/2023   No results for input(s): TSH, T4TOTAL, FREET4, T3FREE, THYROIDAB in the last 72 hours. No results for input(s): VITAMINB12, FOLATE, FERRITIN, TIBC, IRON, RETICCTPCT in the last 72 hours. ------------------------------------------------------------------------------------------------------------------ Cardiac Enzymes No results for input(s): CKMB, TROPONINI, MYOGLOBIN in the last 168 hours.  Invalid input(s): CK  Micro Results No results found for this or any previous visit (from the past 240 hours).  Radiology Report No results found.   Signature  -   Lavada Stank M.D on 01/01/2024 at 8:44 AM   -  To page go to [email or web address]

## 2024-01-01 NOTE — Consult Note (Signed)
 [facility] Renal Consultation Note  Requesting MD: Lavada Stank, MD Indication for Consultation:  AKI   Chief complaint: suicidal ideation and high sugars and leg swelling  HPI:  Sara [name] is a 48 y.o. female with a history of type 2 diabetes poorly controlled, hypertension, acid reflux, sleep apnea, sickle cell trait, and depression who presented to the hospital with suicidal ideation as well as high sugars and leg swelling and tired of feeling awful.  She states that she did have a plan but it was never going to happen.  She does not elaborate.  She states that she is not taking her insulin  every day.  She was found to have nonketotic hyperosmolar state from insulin  noncompliance.  She was also found to have a creatinine of 3.04.  Her past labs are listed below and recently baseline had increased from Cr 0.8 in March of this year to Cr 1.8-1.9 as of September of this year during an inpatient stay.  She has fluid overload per team.  Nephrology is consulted for assistance with management of AKI on CKD 3.  Strict ins/outs are not available.  She had 4 unmeasured urine voids today.  Renal US  with diffusely increased cortical echogenicity.  When asked about her family history, she shares that all of my family has died.  Past NSAID use - not daily but has tried before intermittently for pain relief - it doesn't work.  She states that lasix  pills and lasix  IV meds have been tried before.  She has previously gone to [facility].  She states Lasix  40 mg IV dose won't work.  She states that she has been on prednisone  and antibiotics recently for chest congestion/PNA.     Creatinine, Ser  Date/Time Value Ref Range Status  01/01/2024 02:29 AM 3.10 (H) 0.44 - 1.00 mg/dL Final  87/95/7974 93:75 AM 2.78 (H) 0.44 - 1.00 mg/dL Final  87/95/7974 98:96 AM 3.10 (H) 0.44 - 1.00 mg/dL Final  87/95/7974 87:78 AM 3.04 (H) 0.44 - 1.00 mg/dL Final  90/88/7974 95:86 AM 1.79 (H) 0.44 - 1.00  mg/dL Final  90/89/7974 95:62 AM 1.80 (H) 0.44 - 1.00 mg/dL Final  90/90/7974 91:40 AM 1.89 (H) 0.44 - 1.00 mg/dL Final  90/91/7974 93:88 PM 1.90 (H) 0.44 - 1.00 mg/dL Final  90/91/7974 93:90 PM 1.81 (H) 0.44 - 1.00 mg/dL Final  96/87/7974 89:63 AM 0.83 0.44 - 1.00 mg/dL Final  87/85/7975 96:60 AM 0.63 0.44 - 1.00 mg/dL Final  87/86/7975 91:82 AM 0.61 0.44 - 1.00 mg/dL Final  94/78/7975 92:62 PM 0.96 0.44 - 1.00 mg/dL Final  96/82/7975 95:74 PM 0.70 0.44 - 1.00 mg/dL Final  98/93/7975 93:51 AM 0.53 0.44 - 1.00 mg/dL Final  97/91/7977 90:86 AM 0.64 0.44 - 1.00 mg/dL Final  88/84/7988 87:89 PM 0.66 0.4 - 1.2 mg/dL Final     PMHx:   Past Medical History:  Diagnosis Date   Acid reflux    Chronic pain    Depression    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 pylori infection    High risk medication use    Hypercholesterolemia    Hyperlipidemia, mixed    Hypertension    Other mixed anxiety disorders    Pollen allergies    Sickle cell trait    Sleep apnea    Vitamin D deficiency     Past Surgical History:  Procedure  Laterality Date   ABDOMINAL HYSTERECTOMY     APPENDECTOMY     COLONOSCOPY  08-16-2008   Dr. Princella   ESOPHAGOGASTRODUODENOSCOPY  08-16-2008   Dr. Anette     Family Hx:  Family History  Problem Relation Age of Onset   Clotting disorder Mother    Crohn's disease Mother    Heart disease Mother    Colon cancer Father    Clotting disorder Brother    Diabetes Brother    Diabetes Maternal Aunt    Liver cancer Maternal Uncle    Prostate cancer Maternal Uncle    Colon cancer Maternal Uncle    Diabetes Paternal Aunt    Esophageal cancer Paternal Uncle     Social History:  reports that she has been smoking cigarettes. She has never used smokeless tobacco. She reports that she does not currently use alcohol. She reports that she does not currently use drugs after having  used the following drugs: Marijuana and Cocaine.  Allergies:  Allergies  Allergen Reactions   Amoxicillin Anaphylaxis and Rash   Biaxin  [Clarithromycin ] Itching and Rash   Penicillins Anaphylaxis and Rash    Received ceftriaxone  in 2022 with no issues   Tetracyclines & Related Anaphylaxis   Vibramycin [Doxycycline] Anaphylaxis and Rash   Flagyl  [Metronidazole ] Itching and Rash   Red Dye #40 (Allura Red) Itching    Medications: Prior to Admission medications   Medication Sig Start Date End Date Taking? Authorizing Provider  acetaminophen  (TYLENOL ) 650 MG CR tablet Take 650-1,300 mg by mouth daily as needed for pain.   Yes [provider]  ADVAIR DISKUS 250-50 MCG/ACT AEPB Inhale 1 puff into the lungs in the morning and at bedtime. 11/28/23  Yes [provider]  albuterol  (VENTOLIN  HFA) 108 (90 Base) MCG/ACT inhaler Inhale 2 puffs into the lungs every 6 (six) hours as needed for wheezing or shortness of breath. 10/09/23  Yes Emokpae, Courage, MD  amLODipine  (NORVASC ) 10 MG tablet Take 1 tablet (10 mg total) by mouth daily. 10/10/23  Yes Emokpae, Courage, MD  atorvastatin  (LIPITOR) 40 MG tablet Take 1 tablet (40 mg total) by mouth daily. Patient taking differently: Take 40 mg by mouth at bedtime. 01/12/23  Yes Shafer, Jorene, NP  bisacodyl 5 MG EC tablet Take 5 mg by mouth daily as needed for mild constipation.   Yes [provider]  gabapentin  (NEURONTIN ) 300 MG capsule Take 2 capsules (600 mg total) by mouth 3 (three) times daily. Patient taking differently: Take 600 mg by mouth 2 (two) times daily as needed (for diabetic neuropathy). 10/21/22 12/31/23 Yes Ntuen, Ellouise BROCKS, FNP  ipratropium-albuterol  (DUONEB) 0.5-2.5 (3) MG/3ML SOLN Take 3 mLs by nebulization every 6 (six) hours as needed. 11/27/23  Yes [provider]  metoprolol  succinate (TOPROL -XL) 25 MG 24 hr tablet Take 0.5 tablets (12.5 mg total) by mouth daily. 10/09/23  Yes Emokpae, Courage, MD   montelukast  (SINGULAIR ) 10 MG tablet Take 1 tablet (10 mg total) by mouth at bedtime. 10/09/23  Yes Emokpae, Courage, MD  omeprazole  (PRILOSEC) 40 MG capsule Take 1 capsule (40 mg total) by mouth daily. 10/09/23  Yes Emokpae, Courage, MD  ondansetron  (ZOFRAN -ODT) 4 MG disintegrating tablet Take 4 mg by mouth every 6 (six) hours as needed. 12/06/23  Yes [provider]  aspirin  EC 81 MG tablet Take 1 tablet (81 mg total) by mouth daily with breakfast. Swallow whole. Patient not taking: Reported on 12/31/2023 10/09/23   Pearlean Manus, MD  azithromycin  (ZITHROMAX ) 500 MG  tablet Take 500 mg by mouth daily. Patient not taking: Reported on 12/31/2023 12/21/23   [provider]  cyclobenzaprine  (FLEXERIL ) 10 MG tablet Take 10 mg by mouth 2 (two) times daily. Patient not taking: Reported on 12/31/2023 08/12/23   [provider]  fluticasone  furoate-vilanterol (BREO ELLIPTA ) 100-25 MCG/ACT AEPB Inhale 1 puff into the lungs daily. Patient not taking: Reported on 12/31/2023 10/10/23   Pearlean Manus, MD  insulin  aspart (NOVOLOG ) 100 UNIT/ML injection Inject 0-15 Units into the skin 3 (three) times daily with meals. Patient not taking: Reported on 12/31/2023 10/21/22   Raye Ellouise BROCKS, FNP  LANTUS  SOLOSTAR 100 UNIT/ML Solostar Pen Inject 15 Units into the skin at bedtime. Patient not taking: Reported on 12/31/2023 11/30/23   [provider]  lisinopril  (ZESTRIL ) 20 MG tablet Take 1 tablet (20 mg total) by mouth daily. Patient not taking: Reported on 12/31/2023 10/09/23   Pearlean Manus, MD  senna-docusate (COLACE 2-IN-1) 8.6-50 MG tablet Take 1 tablet by mouth daily as needed (constipation). Patient not taking: Reported on 12/31/2023    [provider]    I have reviewed the patient's current and reported prior to admission medications.  Labs:     Latest Ref Rng & Units 01/01/2024    2:29 AM 12/31/2023    6:24 AM 12/31/2023    1:03 AM  BMP  Glucose 70 - 99 mg/dL 773   852  501   BUN 6 - 20 mg/dL 28  28  28    Creatinine 0.44 - 1.00 mg/dL 6.89  7.21  6.89   Sodium 135 - 145 mmol/L 139  139  140   Potassium 3.5 - 5.1 mmol/L 4.5  4.0  4.4   Chloride 98 - 111 mmol/L 112  113  109   CO2 22 - 32 mmol/L 20  17    Calcium  8.9 - 10.3 mg/dL 7.7  8.1      Urinalysis    Component Value Date/Time   COLORURINE YELLOW 01/01/2024 0943   APPEARANCEUR HAZY (A) 01/01/2024 0943   LABSPEC 1.011 01/01/2024 0943   PHURINE 6.0 01/01/2024 0943   GLUCOSEU >=500 (A) 01/01/2024 0943   HGBUR MODERATE (A) 01/01/2024 0943   BILIRUBINUR NEGATIVE 01/01/2024 0943   KETONESUR NEGATIVE 01/01/2024 0943   PROTEINUR >=300 (A) 01/01/2024 0943   UROBILINOGEN 0.2 12/11/2009 1249   NITRITE NEGATIVE 01/01/2024 0943   LEUKOCYTESUR NEGATIVE 01/01/2024 0943     ROS:  Pertinent items noted in HPI and remainder of comprehensive ROS otherwise negative.  Physical Exam: Vitals:   01/01/24 1123 01/01/24 1549  BP: (!) 150/88 132/72  Pulse: (!) 103 95  Resp: 19 19  Temp: 97.7 F (36.5 C) 98.2 F (36.8 C)  SpO2: 100% 98%     General: adult female in bed in NAD HEENT: NCAT Eyes: EOMI sclera anicteric Neck:supple trachea midline Heart: S1S2 no rub Lungs: clear to auscultation; normal work of breathing at rest on room air  Abdomen: soft/nt/nd; obese habitus  Extremities: 1-2+ edema bilaterally lower extremities Skin: no rash on extremities exposed Psych: no agitation; seems overwhelmed Neuro: alert and oriented x3 provides hx and follows commands   Assessment/Plan:  # AKI - Secondary to uncontrolled DM and nonketotic hyperosmolar state from insulin  noncompliance - Continue supportive care and optimize diabetes - hold lisinopril  as you are - ordered strict ins/outs and daily weights   # Nephrotic range proteinuria  - UP/cr ratio is 6220 mg/g - she is most likely nephrotic range proteinuria and  CKD from her uncontrolled diabetes and thus decreased renal reserve  - serologic  work-up as below - Will also check SPEP, UPEP, and free light chains  - With fluid overload - Will give lasix  80 mg IV once now and reassess response tomorrow   # CKD stage IIIb - Baseline creatinine 1.8-1.9 as 09/2023 admission - unclear if this is representative of her outpatient baseline as labs quite a bit better in March of this year though she's off meds and a diabetic   # Microscopic Hematuria - repeat UA with persistent RBC - Check ANA, complement, ANCA, HIV, and hepatitis panel  # Metabolic acidosis - secondary to AKI and nonketotic hyperosmolar state from insulin  noncompliance  # HTN  - Acceptable control   # Normocytic anemia  - may have had component from CKD - Check iron panel  Disposition - continue inpatient monitoring      Thank you for the consult.  Please do not hesitate to contact me with any questions regarding our patient.    Sara [name] 01/01/2024, 6:03 PM

## 2024-01-01 NOTE — Evaluation (Addendum)
 Physical Therapy Evaluation Patient Details Name: Sara [name] MRN: [id number] DOB: 1975-02-13 Today's Date: 01/01/2024  History of Present Illness  48 y.o female admitted to [facility] on 12/3 from group home for suicidal ideation, hyperglycemia, and hypertension. PMH: GERD, depression, type 2 diabetes mellitus, H. pylori infection, hypertension, dyslipidemia and OSA.   Clinical Impression  Pt received lying in bed with sitter present. Pt is independent with bed mobility, transfers, and ambulation. Pt demonstrates good seated balance while EOB and displays no LOB while ambulating. Pt is tolerating modified community distances at this time and benefits from brief standing rest breaks with pursed lip breathing. Pt's greatest limitations at this time are related to pain from B LE edema, which is being managed medically. Ankle pumps discussed with patient to promote fluid distribution. Pt is otherwise mobilizing at her baseline and does not require further PT services at this time.        If plan is discharge home, recommend the following: Other (comment) (Does not require assist to return home.)   Can travel by private vehicle    Yes    Equipment Recommendations  None  Recommendations for Other Services    None   Functional Status Assessment Patient has not had a recent decline in their functional status     Precautions / Restrictions Precautions Precautions: Fall Restrictions Weight Bearing Restrictions Per Provider Order: No      Mobility  Bed Mobility Overal bed mobility: Modified Independent             General bed mobility comments: Utilizes bed rails as needed    Transfers Overall transfer level: Independent Equipment used: None               General transfer comment: Increased stiffness upon standing, improves with time. No reports of dizziness with position changes.    Ambulation/Gait Ambulation/Gait assistance: Independent   Assistive device: None Gait  Pattern/deviations: Step-through pattern, Decreased step length - right, Decreased step length - left   Gait velocity interpretation: 1.31 - 2.62 ft/sec, indicative of limited community ambulator   General Gait Details: Mild waddling pattern, minimal foot clearance. Pt reports feeling she is ambulating slower compared to baseline.  Stairs            Wheelchair Mobility     Tilt Bed    Modified Rankin (Stroke Patients Only)       Balance Overall balance assessment: Independent                                           Pertinent Vitals/Pain Pain Assessment Pain Assessment: 0-10 Pain Score: 7  Pain Location: B LE and abdomen Pain Descriptors / Indicators: Aching, Discomfort, Pressure, Tightness Pain Intervention(s): Monitored during session, Repositioned    Home Living Family/patient expects to be discharged to:: Shelter/Homeless                        Prior Function Prior Level of Function : Independent/Modified Independent             Mobility Comments: Pt reports walking a lot throughout the day due to homelessness. ADLs Comments: Pt dresses, bathes, and toilets independently.     Extremity/Trunk Assessment   Upper Extremity Assessment Upper Extremity Assessment: Defer to OT evaluation    Lower Extremity Assessment Lower Extremity Assessment: LLE deficits/detail LLE Deficits / Details:  Baseline peripheral neuropahy, minimal sensation in B LE, otherwise B LE WNL. LLE Sensation: decreased light touch LLE Coordination: WNL    Cervical / Trunk Assessment Cervical / Trunk Assessment: Normal  Communication   Communication Communication: No apparent difficulties    Cognition Arousal: Alert Behavior During Therapy: WFL for tasks assessed/performed   PT - Cognitive impairments: No apparent impairments                         Following commands: Intact       Cueing Cueing Techniques: Verbal cues     General  Comments General comments (skin integrity, edema, etc.): Telemetry intact. Mild pitting edema noted in B dorsum of feet (grade 2), nonpitting edema B LE    Exercises     Assessment/Plan    PT Assessment Patient does not need any further PT services  PT Problem List         PT Treatment Interventions      PT Goals (Current goals can be found in the Care Plan section)  Acute Rehab PT Goals Patient Stated Goal: Pt's stated goals were related to medical care, no mobility goals were reported.    Frequency       Co-evaluation               AM-PAC PT 6 Clicks Mobility  Outcome Measure Help needed turning from your back to your side while in a flat bed without using bedrails?: None Help needed moving from lying on your back to sitting on the side of a flat bed without using bedrails?: None Help needed moving to and from a bed to a chair (including a wheelchair)?: None Help needed standing up from a chair using your arms (e.g., wheelchair or bedside chair)?: None Help needed to walk in hospital room?: None Help needed climbing 3-5 steps with a railing? : None 6 Click Score: 24    End of Session   Activity Tolerance: Patient tolerated treatment well Patient left: in chair;with call bell/phone within reach;with chair alarm set;with nursing/sitter in room Nurse Communication: Mobility status PT Visit Diagnosis: Other abnormalities of gait and mobility (R26.89);Pain Pain - Right/Left: Left (and Right) Pain - part of body: Leg    Time: 1115-1135 PT Time Calculation (min) (ACUTE ONLY): 20 min   Charges:   PT Evaluation $PT Eval Low Complexity: 1 Low   PT General Charges $$ ACUTE PT VISIT: 1 Visit         Sabra Morel, PT, DPT  Acute Rehabilitation Services         Office: [phone]    Sabra MARLA Morel 01/01/2024, 12:48 PM

## 2024-01-02 DIAGNOSIS — E1100 Type 2 diabetes mellitus with hyperosmolarity without nonketotic hyperglycemic-hyperosmolar coma (NKHHC): Secondary | ICD-10-CM | POA: Diagnosis not present

## 2024-01-02 LAB — CBC WITH DIFFERENTIAL/PLATELET
Abs Immature Granulocytes: 0.06 K/uL (ref 0.00–0.07)
Basophils Absolute: 0 K/uL (ref 0.0–0.1)
Basophils Relative: 0 %
Eosinophils Absolute: 0.1 K/uL (ref 0.0–0.5)
Eosinophils Relative: 1 %
HCT: 23.8 % — ABNORMAL LOW (ref 36.0–46.0)
Hemoglobin: 7.7 g/dL — ABNORMAL LOW (ref 12.0–15.0)
Immature Granulocytes: 1 %
Lymphocytes Relative: 26 %
Lymphs Abs: 2.5 K/uL (ref 0.7–4.0)
MCH: 27.1 pg (ref 26.0–34.0)
MCHC: 32.4 g/dL (ref 30.0–36.0)
MCV: 83.8 fL (ref 80.0–100.0)
Monocytes Absolute: 0.7 K/uL (ref 0.1–1.0)
Monocytes Relative: 7 %
Neutro Abs: 6.5 K/uL (ref 1.7–7.7)
Neutrophils Relative %: 65 %
Platelets: 151 K/uL (ref 150–400)
RBC: 2.84 MIL/uL — ABNORMAL LOW (ref 3.87–5.11)
RDW: 14.2 % (ref 11.5–15.5)
WBC: 9.9 K/uL (ref 4.0–10.5)
nRBC: 0 % (ref 0.0–0.2)

## 2024-01-02 LAB — BASIC METABOLIC PANEL WITH GFR
Anion gap: 6 (ref 5–15)
BUN: 32 mg/dL — ABNORMAL HIGH (ref 6–20)
CO2: 20 mmol/L — ABNORMAL LOW (ref 22–32)
Calcium: 7.8 mg/dL — ABNORMAL LOW (ref 8.9–10.3)
Chloride: 111 mmol/L (ref 98–111)
Creatinine, Ser: 3.28 mg/dL — ABNORMAL HIGH (ref 0.44–1.00)
GFR, Estimated: 17 mL/min — ABNORMAL LOW (ref >60)
Glucose, Bld: 185 mg/dL — ABNORMAL HIGH (ref 70–99)
Potassium: 4.7 mmol/L (ref 3.5–5.1)
Sodium: 137 mmol/L (ref 135–145)

## 2024-01-02 LAB — HEPATITIS PANEL, ACUTE
HCV Ab: NONREACTIVE
Hep A IgM: NONREACTIVE
Hep B C IgM: NONREACTIVE
Hepatitis B Surface Ag: NONREACTIVE

## 2024-01-02 LAB — IRON AND TIBC
Iron: 41 ug/dL (ref 28–170)
Saturation Ratios: 18 % (ref 10.4–31.8)
TIBC: 228 ug/dL — ABNORMAL LOW (ref 250–450)
UIBC: 187 ug/dL

## 2024-01-02 LAB — GLUCOSE, CAPILLARY
Glucose-Capillary: 201 mg/dL — ABNORMAL HIGH (ref 70–99)
Glucose-Capillary: 224 mg/dL — ABNORMAL HIGH (ref 70–99)
Glucose-Capillary: 261 mg/dL — ABNORMAL HIGH (ref 70–99)
Glucose-Capillary: 270 mg/dL — ABNORMAL HIGH (ref 70–99)

## 2024-01-02 LAB — HIV ANTIBODY (ROUTINE TESTING W REFLEX): HIV Screen 4th Generation wRfx: NONREACTIVE

## 2024-01-02 LAB — PHOSPHORUS: Phosphorus: 3.2 mg/dL (ref 2.5–4.6)

## 2024-01-02 LAB — FERRITIN: Ferritin: 180 ng/mL (ref 11–307)

## 2024-01-02 LAB — UREA NITROGEN, URINE: Urea Nitrogen, Ur: 381 mg/dL

## 2024-01-02 LAB — ABO/RH: ABO/RH(D): O POS

## 2024-01-02 LAB — MAGNESIUM: Magnesium: 1.6 mg/dL — ABNORMAL LOW (ref 1.7–2.4)

## 2024-01-02 LAB — FOLATE: Folate: 8.5 ng/mL (ref >5.9)

## 2024-01-02 LAB — VITAMIN B12: Vitamin B-12: 434 pg/mL (ref 180–914)

## 2024-01-02 LAB — PREPARE RBC (CROSSMATCH)

## 2024-01-02 MED ORDER — LORAZEPAM 2 MG/ML IJ SOLN
0.5000 mg | Freq: Once | INTRAMUSCULAR | Status: DC
  Filled 2024-01-02: qty 1

## 2024-01-02 MED ORDER — POLYSACCHARIDE IRON COMPLEX 150 MG PO CAPS
150.0000 mg | ORAL_CAPSULE | Freq: Every day | ORAL | Status: AC
  Administered 2024-01-02 – 2024-01-22 (×20): 150 mg via ORAL
  Filled 2024-01-02 (×22): qty 1

## 2024-01-02 MED ORDER — HYDRALAZINE HCL 50 MG PO TABS
50.0000 mg | ORAL_TABLET | Freq: Three times a day (TID) | ORAL | Status: DC
  Administered 2024-01-02 – 2024-01-03 (×4): 50 mg via ORAL
  Filled 2024-01-02 (×4): qty 1

## 2024-01-02 MED ORDER — MELATONIN 3 MG PO TABS
3.0000 mg | ORAL_TABLET | Freq: Every day | ORAL | Status: DC
  Administered 2024-01-02 – 2024-01-21 (×19): 3 mg via ORAL
  Filled 2024-01-02 (×20): qty 1

## 2024-01-02 MED ORDER — CARVEDILOL 6.25 MG PO TABS
6.2500 mg | ORAL_TABLET | Freq: Two times a day (BID) | ORAL | Status: DC
  Administered 2024-01-02 – 2024-01-10 (×12): 6.25 mg via ORAL
  Filled 2024-01-02 (×14): qty 1

## 2024-01-02 MED ORDER — IPRATROPIUM-ALBUTEROL 0.5-2.5 (3) MG/3ML IN SOLN
RESPIRATORY_TRACT | Status: DC
  Filled 2024-01-02: qty 3

## 2024-01-02 MED ORDER — LORAZEPAM 2 MG/ML IJ SOLN: 0.5000 mg | Freq: Once | INTRAMUSCULAR | Status: DC

## 2024-01-02 MED ORDER — SODIUM CHLORIDE 0.9% IV SOLUTION: Freq: Once | INTRAVENOUS | Status: AC

## 2024-01-02 MED ORDER — ALBUMIN HUMAN 25 % IV SOLN
25.0000 g | Freq: Once | INTRAVENOUS | Status: AC
  Administered 2024-01-02: 25 g via INTRAVENOUS
  Filled 2024-01-02: qty 100

## 2024-01-02 MED ORDER — MAGNESIUM SULFATE 4 GM/100ML IV SOLN
4.0000 g | Freq: Once | INTRAVENOUS | Status: AC
  Administered 2024-01-02: 4 g via INTRAVENOUS
  Filled 2024-01-02: qty 100

## 2024-01-02 MED ORDER — GABAPENTIN 300 MG PO CAPS
300.0000 mg | ORAL_CAPSULE | Freq: Two times a day (BID) | ORAL | Status: DC
  Administered 2024-01-03 – 2024-01-06 (×8): 300 mg via ORAL
  Filled 2024-01-02 (×9): qty 1

## 2024-01-02 NOTE — Progress Notes (Signed)
 Ok to reduce gabapentin  to 300mg  BID due to renal function per Dr. Dennise.  Sergio Batch, PharmD, BCIDP, AAHIVP, CPP Infectious Disease Pharmacist 01/02/2024 4:56 PM

## 2024-01-02 NOTE — Progress Notes (Signed)
 [facility] Progress Note  Name: Sara [name] MRN: [id number] DOB: 1975-10-25  Chief Complaint:  Suicidal ideation and swelling and hyperglycemia everything  Subjective:  Contacted by inpatient psych that she is awaiting a bed there - we discussed that medical work-up is still ongoing so not ready to discharge there yet.  She had 900 mL L UOP over 12/5 as well as 5 unmeasured urine voids.  She got lasix  80 mg IV once.  She can tell a little difference in her swelling.  She is about to get an echo.  Went back and discussed with her that albumin  is a human blood product - she states that she will accept this and that at some point she must have declined blood products.  She is not a tefl teacher witness.  Discussed risks/benefits/indications for albumin  and she is willing to proceed.    Review of systems:  She denies nausea or vomiting  Urinated in response to lasix  Still doesn't feel great Legs hurt and are swollen No shortness of breath or chest pain   ---------------- Background on consult:  Sara [name] is a 48 y.o. female with a history of type 2 diabetes poorly controlled, hypertension, acid reflux, sleep apnea, sickle cell trait, and depression who presented to the hospital with suicidal ideation as well as high sugars and leg swelling and tired of feeling awful.  She states that she did have a plan but it was never going to happen.  She does not elaborate.  She states that she is not taking her insulin  every day.  She was found to have nonketotic hyperosmolar state from insulin  noncompliance.  She was also found to have a creatinine of 3.04.  Her past labs are listed below and recently baseline had increased from Cr 0.8 in March of this year to Cr 1.8-1.9 as of September of this year during an inpatient stay.  She has fluid overload per team.  Nephrology is consulted for assistance with management of AKI on CKD 3.  Strict ins/outs are not available.  She had 4  unmeasured urine voids today.  Renal US  with diffusely increased cortical echogenicity.  When asked about her family history, she shares that all of my family has died.  Past NSAID use - not daily but has tried before intermittently for pain relief - it doesn't work.  She states that lasix  pills and lasix  IV meds have been tried before.  She has previously gone to [place].  She states Lasix  40 mg IV dose won't work.  She states that she has been on prednisone  and antibiotics recently for chest congestion/PNA.       Intake/Output Summary (Last 24 hours) at 01/02/2024 1215 Last data filed at 01/02/2024 0942 Gross per 24 hour  Intake 840 ml  Output 1700 ml  Net -860 ml    Vitals:  Vitals:   01/02/24 0640 01/02/24 0753 01/02/24 1105 01/02/24 1110  BP: (!) 173/94 (!) 167/87  (!) 160/91  Pulse: (!) 102 (!) 102  (!) 105  Resp: 16 18  18   Temp: 98 F (36.7 C) 98.2 F (36.8 C)  97.7 F (36.5 C)  TempSrc:  Oral  Oral  SpO2: 100% 99%  100%  Weight:   91.9 kg   Height:         Physical Exam:  General adult female in bed in no acute distress HEENT normocephalic atraumatic extraocular movements intact sclera anicteric Neck supple trachea midline Lungs clear to auscultation bilaterally normal  work of breathing at rest on room air Heart S1S2 no rub Abdomen soft nontender distended/obese habitus Extremities diffuse lower extremity edema 2+ with legs tender Psych no anxiety or agitation  Neuro alert and oriented x 3 provides hx and follows commands    Medications reviewed   Labs:     Latest Ref Rng & Units 01/02/2024    1:20 AM 01/01/2024    2:29 AM 12/31/2023    6:24 AM  BMP  Glucose 70 - 99 mg/dL 814  773  852   BUN 6 - 20 mg/dL 32  28  28   Creatinine 0.44 - 1.00 mg/dL 6.71  6.89  7.21   Sodium 135 - 145 mmol/L 137  139  139   Potassium 3.5 - 5.1 mmol/L 4.7  4.5  4.0   Chloride 98 - 111 mmol/L 111  112  113   CO2 22 - 32 mmol/L 20  20  17    Calcium  8.9 - 10.3 mg/dL 7.8   7.7  8.1      Assessment/Plan:    # AKI - Most likely secondary to uncontrolled DM and nonketotic hyperosmolar state from insulin  noncompliance.  Also note nephrotic range proteinuria and anasarca - concerning for possible GN - Continue supportive care and optimize diabetes - hold lisinopril  as you are - would reduce gabapentin  to 300 mg daily  - ordered strict ins/outs and daily weights    # Nephrotic range proteinuria  - UP/cr ratio is 6220 mg/g - she is most likely nephrotic range proteinuria and CKD from her uncontrolled diabetes and thus decreased renal reserve  - serologic work-up as below - SPEP and free light chains pending and UPEP not yet obtained - RN is going to send this  # Fluid volume overload - Strict ins/outs and daily weights - Assess diuretic needs daily - lasix  80 mg IV once and albumin  25 gram IV once today     # CKD stage IIIb - Baseline creatinine 1.8-1.9 as 09/2023 admission - unclear if this is representative of her outpatient baseline as labs quite a bit better in March of this year though she's off meds and a diabetic    # Microscopic Hematuria - repeat UA with persistent RBC - ANA, complement, ANCA pending.  HIV non-reactive and hepatitis panel non-reactive   # Metabolic acidosis - secondary to AKI and nonketotic hyperosmolar state from insulin  noncompliance   # HTN  - lasix  again today as above - ideal to come down on amlodipine  today and hopefully come off but uncontrolled as is so keep for now    # Normocytic anemia  - may have had component from CKD - iron  deficiency - start oral iron    Disposition - continue inpatient monitoring     Sara JAYSON Saba, MD 01/02/2024 1:00 PM

## 2024-01-02 NOTE — Progress Notes (Signed)
 PROGRESS NOTE     Patient Demographics:    Sara [name], is a 48 y.o. female, DOB - 1975/07/08, FMW:[id number]  Outpatient Primary MD for the patient is Lenon Homer, MD    LOS - 2  Admit date - 12/30/2023    Chief Complaint  Patient presents with   Psychiatric Evaluation        Hyperglycemia   Hypertension    Pt non compliant with meds and having SI behavior issues at a group home.        Brief Narrative (HPI from H&P)   48 y.o. African-American female with medical history significant for GERD, depression, type 2 diabetes mellitus, H. pylori infection, hypertension, dyslipidemia and OSA, who presented to the emergency room with acute onset of psychiatric evaluation for suicidal ideation and hyperglycemia.  The patient admitted to polydipsia without significant polyuria.  No chest pain or palpitations.  No cough or wheezing or dyspnea.  No dysuria, oliguria or hematuria or flank pain.  In the ER was diagnosed with nonketotic hyperosmolar state from not taking her insulin  for a while, depression, suicidal ideation and she was admitted to the hospital.   Subjective:    Sara [name] today has, No headache, No chest pain, No abdominal pain - No Nausea, No new weakness tingling or numbness, no shortness of breath, feels depressed.   Assessment  & Plan :   Type 2 diabetes mellitus with hyperosmolar nonketotic hyperglycemia (HCC)  - Due to noncompliance with insulin , was depressed and not taking it for several days, initially on IV insulin  drip now transition to subcu, counseled on compliance, monitor.  Lab Results  Component Value Date   HGBA1C 8.6 (H) 10/06/2023   CBG (last 3)  Recent Labs    01/01/24 1553  01/01/24 2158 01/02/24 0750  GLUCAP 191* 252* 201*      Acute kidney injury superimposed on CKD stage IIIb with proteinuria and some hematuria  - Clinically dehydrated however has 1+ edema which is chronic, likely developing diabetic nephropathy from poorly controlled diabetes mellitus, nephrology on board defer management to nephrology   GERD  without esophagitis  - We will continue PPI therapy.   Dyslipidemia - Will continue statin therapy.   Anemia of chronic disease.  Transfuse 1 unit of packed RBC on 01/02/2024.  Monitor.  Does have some chronic intermittent hematuria due to underlying renal disease will monitor that as well.    Essential hypertension - Blood pressure medication regimen adjusted on 01/02/2024 for better control  Depression with suicidal ideation.  Sitter and psych eval. may require [facility] per psych.        Condition - Fair  Family Communication  : None present  Code Status :  Full  Consults  :  Psych, Renal  PUD Prophylaxis :  PPI   Procedures  :     Renal US  -       Disposition Plan  :    Status is: Inpatient  DVT Prophylaxis  :    enoxaparin  (LOVENOX ) injection 30 mg Start: 01/01/24 1000   Lab Results  Component Value Date   PLT 151 01/02/2024    Diet :  Diet Order             Diet heart healthy/carb modified Room service appropriate? Yes; Fluid consistency: Thin  Diet effective now                    Inpatient Medications  Scheduled Meds:  sodium chloride    Intravenous Once   amLODipine   10 mg Oral Daily   aspirin  EC  81 mg Oral Q breakfast   atorvastatin   40 mg Oral QHS   carvedilol   6.25 mg Oral BID WC   cyclobenzaprine   10 mg Oral BID   enoxaparin  (LOVENOX ) injection  30 mg Subcutaneous Q24H   fluticasone  furoate-vilanterol  1 puff Inhalation Daily   gabapentin   300 mg Oral TID   hydrALAZINE   50 mg Oral Q8H   insulin  aspart  0-5 Units Subcutaneous QHS   insulin  aspart  0-6 Units Subcutaneous TID WC   insulin   glargine-yfgn  10 Units Subcutaneous QHS   montelukast   10 mg Oral QHS   pantoprazole   40 mg Oral Daily   sodium bicarbonate   650 mg Oral BID   Continuous Infusions:  magnesium  sulfate bolus IVPB     PRN Meds:.acetaminophen  **OR** acetaminophen , albuterol , cyclobenzaprine , dextrose , magnesium  hydroxide, ondansetron  **OR** ondansetron  (ZOFRAN ) IV, senna-docusate, traZODone   Antibiotics  :    Anti-infectives (From admission, onward)    None         Objective:   Vitals:   01/01/24 2349 01/02/24 0303 01/02/24 0640 01/02/24 0753  BP: (!) 163/94 (!) 173/94 (!) 173/94 (!) 167/87  Pulse: (!) 103 98 (!) 102 (!) 102  Resp: 16 15 16 18   Temp: 98.2 F (36.8 C) 98.2 F (36.8 C) 98 F (36.7 C) 98.2 F (36.8 C)  TempSrc:    Oral  SpO2: 100% 100% 100% 99%  Weight:      Height:        Wt Readings from Last 3 Encounters:  12/31/23 90.7 kg  10/07/23 64.5 kg  04/08/23 74.8 kg     Intake/Output Summary (Last 24 hours) at 01/02/2024 0759 Last data filed at 01/02/2024 0300 Gross per 24 hour  Intake 360 ml  Output 900 ml  Net -540 ml     Physical Exam  Awake Alert, No new F.N deficits, Normal affect [place].AT,PERRAL Supple Neck, No JVD,   Symmetrical Chest wall movement, Good air movement bilaterally, CTAB RRR,No Gallops,Rubs or new Murmurs,  +ve B.Sounds,  Abd Soft, No tenderness,   1+ edema      Data Review:    Recent Labs  Lab 12/31/23 0021 12/31/23 0103 12/31/23 0624 01/02/24 0258  WBC 12.3*  --  10.0 9.9  HGB 8.1* 8.2* 8.3* 7.7*  HCT 25.0* 24.0* 28.1* 23.8*  PLT 173  --  148* 151  MCV 83.6  --  90.9 83.8  MCH 27.1  --  26.9 27.1  MCHC 32.4  --  29.5* 32.4  RDW 14.4  --  14.5 14.2  LYMPHSABS 2.1  --   --  2.5  MONOABS 0.9  --   --  0.7  EOSABS 0.1  --   --  0.1  BASOSABS 0.1  --   --  0.0    Recent Labs  Lab 12/31/23 0021 12/31/23 0103 12/31/23 0624 01/01/24 0229 01/02/24 0120  NA 137 140 139 139 137  K 4.4 4.4 4.0 4.5 4.7  CL 109 109 113* 112* 111   CO2 21*  --  17* 20* 20*  ANIONGAP 7  --  9 7 6   GLUCOSE 534* 498* 147* 226* 185*  BUN 28* 28* 28* 28* 32*  CREATININE 3.04* 3.10* 2.78* 3.10* 3.28*  AST 22  --   --   --   --   ALT 31  --   --   --   --   ALKPHOS 88  --   --   --   --   BILITOT 0.7  --   --   --   --   ALBUMIN  2.3*  --   --   --   --   MG  --   --   --   --  1.6*  PHOS  --   --   --   --  3.2  CALCIUM  7.9*  --  8.1* 7.7* 7.8*      Recent Labs  Lab 12/31/23 0021 12/31/23 0624 01/01/24 0229 01/02/24 0120  MG  --   --   --  1.6*  CALCIUM  7.9* 8.1* 7.7* 7.8*    --------------------------------------------------------------------------------------------------------------- Lab Results  Component Value Date   CHOL 215 (H) 01/10/2023   HDL 51 01/10/2023   LDLCALC 148 (H) 01/10/2023   TRIG 79 01/10/2023   CHOLHDL 4.2 01/10/2023    Lab Results  Component Value Date   HGBA1C 8.6 (H) 10/06/2023   No results for input(s): TSH, T4TOTAL, FREET4, T3FREE, THYROIDAB in the last 72 hours. Recent Labs    01/02/24 0120 01/02/24 0636  VITAMINB12  --  434  FOLATE 8.5  --   FERRITIN 180  --   TIBC 228*  --   IRON  41  --    ------------------------------------------------------------------------------------------------------------------ Cardiac Enzymes No results for input(s): CKMB, TROPONINI, MYOGLOBIN in the last 168 hours.  Invalid input(s): CK  Micro Results No results found for this or any previous visit (from the past 240 hours).  Radiology Report US  RENAL Result Date: 01/01/2024 CLINICAL DATA:  Acute kidney injury EXAM: RENAL / URINARY TRACT ULTRASOUND COMPLETE COMPARISON:  CT abdomen and pelvis dated 12/06/2023 FINDINGS: Right Kidney: Length = 11.8 cm AP renal pelvis diameter = <10 mm Diffusely increased cortical echogenicity with preserved corticomedullary differentiation which can be seen with medical renal disease. Extrarenal pelvis. No urinary tract dilation or shadowing calculi.  The ureter is not seen. Left Kidney: Length = 12.5 cm AP renal pelvis diameter = <10 mm Diffusely increased cortical echogenicity with preserved corticomedullary differentiation which can be  seen with medical renal disease. No urinary tract dilation or shadowing calculi. The ureter is not seen. Bladder: Appears normal for degree of bladder distention. Prevoid bladder volume measures 139 mL Other: None. IMPRESSION: Diffusely increased cortical echogenicity with preserved corticomedullary differentiation which can be seen with medical renal disease. No urinary tract dilation or shadowing calculi. Electronically Signed   By: Limin  Xu M.D.   On: 01/01/2024 13:19     Signature  -   Lavada Stank M.D on 01/02/2024 at 7:59 AM   -  To page go to [email or web address]

## 2024-01-03 DIAGNOSIS — E1100 Type 2 diabetes mellitus with hyperosmolarity without nonketotic hyperglycemic-hyperosmolar coma (NKHHC): Secondary | ICD-10-CM | POA: Diagnosis not present

## 2024-01-03 LAB — TYPE AND SCREEN
ABO/RH(D): O POS
Antibody Screen: NEGATIVE
Unit division: 0

## 2024-01-03 LAB — CBC WITH DIFFERENTIAL/PLATELET
Abs Immature Granulocytes: 0.03 K/uL (ref 0.00–0.07)
Basophils Absolute: 0 K/uL (ref 0.0–0.1)
Basophils Relative: 0 %
Eosinophils Absolute: 0.1 K/uL (ref 0.0–0.5)
Eosinophils Relative: 1 %
HCT: 24.4 % — ABNORMAL LOW (ref 36.0–46.0)
Hemoglobin: 7.9 g/dL — ABNORMAL LOW (ref 12.0–15.0)
Immature Granulocytes: 0 %
Lymphocytes Relative: 19 %
Lymphs Abs: 1.6 K/uL (ref 0.7–4.0)
MCH: 27.4 pg (ref 26.0–34.0)
MCHC: 32.4 g/dL (ref 30.0–36.0)
MCV: 84.7 fL (ref 80.0–100.0)
Monocytes Absolute: 0.6 K/uL (ref 0.1–1.0)
Monocytes Relative: 7 %
Neutro Abs: 6.1 K/uL (ref 1.7–7.7)
Neutrophils Relative %: 73 %
Platelets: 120 K/uL — ABNORMAL LOW (ref 150–400)
RBC: 2.88 MIL/uL — ABNORMAL LOW (ref 3.87–5.11)
RDW: 14.1 % (ref 11.5–15.5)
WBC: 8.5 K/uL (ref 4.0–10.5)
nRBC: 0 % (ref 0.0–0.2)

## 2024-01-03 LAB — PHOSPHORUS: Phosphorus: 4.1 mg/dL (ref 2.5–4.6)

## 2024-01-03 LAB — BASIC METABOLIC PANEL WITH GFR
Anion gap: 6 (ref 5–15)
BUN: 38 mg/dL — ABNORMAL HIGH (ref 6–20)
CO2: 19 mmol/L — ABNORMAL LOW (ref 22–32)
Calcium: 8.1 mg/dL — ABNORMAL LOW (ref 8.9–10.3)
Chloride: 112 mmol/L — ABNORMAL HIGH (ref 98–111)
Creatinine, Ser: 3.53 mg/dL — ABNORMAL HIGH (ref 0.44–1.00)
GFR, Estimated: 15 mL/min — ABNORMAL LOW (ref >60)
Glucose, Bld: 235 mg/dL — ABNORMAL HIGH (ref 70–99)
Potassium: 5 mmol/L (ref 3.5–5.1)
Sodium: 137 mmol/L (ref 135–145)

## 2024-01-03 LAB — BPAM RBC
Blood Product Expiration Date: 202601012359
ISSUE DATE / TIME: 202512061753
Unit Type and Rh: 5100

## 2024-01-03 LAB — GLUCOSE, CAPILLARY
Glucose-Capillary: 250 mg/dL — ABNORMAL HIGH (ref 70–99)
Glucose-Capillary: 233 mg/dL — ABNORMAL HIGH (ref 70–99)
Glucose-Capillary: 257 mg/dL — ABNORMAL HIGH (ref 70–99)
Glucose-Capillary: 224 mg/dL — ABNORMAL HIGH (ref 70–99)

## 2024-01-03 LAB — MAGNESIUM: Magnesium: 2.3 mg/dL (ref 1.7–2.4)

## 2024-01-03 MED ORDER — FUROSEMIDE 10 MG/ML IJ SOLN
80.0000 mg | Freq: Once | INTRAMUSCULAR | Status: AC
  Administered 2024-01-03: 80 mg via INTRAVENOUS
  Filled 2024-01-03: qty 8

## 2024-01-03 MED ORDER — SODIUM ZIRCONIUM CYCLOSILICATE 10 G PO PACK
10.0000 g | PACK | Freq: Two times a day (BID) | ORAL | Status: AC
  Administered 2024-01-03 (×2): 10 g via ORAL
  Filled 2024-01-03 (×2): qty 1

## 2024-01-03 MED ORDER — HYDRALAZINE HCL 50 MG PO TABS
100.0000 mg | ORAL_TABLET | Freq: Three times a day (TID) | ORAL | Status: DC
  Administered 2024-01-03 – 2024-01-21 (×37): 100 mg via ORAL
  Filled 2024-01-03 (×51): qty 2

## 2024-01-03 MED ORDER — INSULIN GLARGINE 100 UNIT/ML ~~LOC~~ SOLN
14.0000 [IU] | Freq: Every day | SUBCUTANEOUS | Status: DC
  Administered 2024-01-03: 14 [IU] via SUBCUTANEOUS
  Filled 2024-01-03 (×2): qty 0.14
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
## 2024-01-03 NOTE — Progress Notes (Signed)
 [facility] Progress Note  Name: MALIKA DEMARIO MRN: [id number] DOB: 06/26/1975  Chief Complaint:  Suicidal ideation and swelling and hyperglycemia everything  Subjective:  She is awaiting medical clearance to go to a bed at inpatient psych.  She had 800 mL UOP over 12/6 as well as 3 unmeasured urine voids.  She got albumin  yesterday but not lasix .  She has been put on oxygen.  We discussed that she may need a renal biopsy - she would have to think about this.  Her adult son would make her medical decisions if she were not able to.    Review of systems:    Some nausea  Urinated in response to lasix  She is short of breath; no chest pain Swelling is better but still present; legs tender  ---------------- Background on consult:  ALANNY RIVERS is a 48 y.o. female with a history of type 2 diabetes poorly controlled, hypertension, acid reflux, sleep apnea, sickle cell trait, and depression who presented to the hospital with suicidal ideation as well as high sugars and leg swelling and tired of feeling awful.  She states that she did have a plan but it was never going to happen.  She does not elaborate.  She states that she is not taking her insulin  every day.  She was found to have nonketotic hyperosmolar state from insulin  noncompliance.  She was also found to have a creatinine of 3.04.  Her past labs are listed below and recently baseline had increased from Cr 0.8 in March of this year to Cr 1.8-1.9 as of September of this year during an inpatient stay.  She has fluid overload per team.  Nephrology is consulted for assistance with management of AKI on CKD 3.  Strict ins/outs are not available.  She had 4 unmeasured urine voids today.  Renal US  with diffusely increased cortical echogenicity.  When asked about her family history, she shares that all of my family has died.  Past NSAID use - not daily but has tried before intermittently for pain relief - it doesn't work.  She  states that lasix  pills and lasix  IV meds have been tried before.  She has previously gone to [facility].  She states Lasix  40 mg IV dose won't work.  She states that she has been on prednisone  and antibiotics recently for chest congestion/PNA.       Intake/Output Summary (Last 24 hours) at 01/03/2024 1355 Last data filed at 01/02/2024 2045 Gross per 24 hour  Intake 602.5 ml  Output --  Net 602.5 ml    Vitals:  Vitals:   01/03/24 0500 01/03/24 0742 01/03/24 0822 01/03/24 1338  BP:   (!) 164/88 128/68  Pulse:   (!) 105   Resp:   18   Temp:   97.8 F (36.6 C)   TempSrc:   Oral   SpO2:  93%    Weight: 92.9 kg     Height:         Physical Exam:   General adult female in bed in no acute distress HEENT normocephalic atraumatic extraocular movements intact sclera anicteric Neck supple trachea midline Lungs clear to auscultation bilaterally normal work of breathing at rest on nasal cannula  Heart S1S2 no rub Abdomen soft nontender distended/obese habitus Extremities diffuse lower extremity edema 2+ with legs tender Psych no anxiety or agitation  Neuro alert and oriented x 3 provides hx and follows commands  GU - no foley    Medications reviewed  Labs:     Latest Ref Rng & Units 01/03/2024    7:29 AM 01/02/2024    1:20 AM 01/01/2024    2:29 AM  BMP  Glucose 70 - 99 mg/dL 764  814  773   BUN 6 - 20 mg/dL 38  32  28   Creatinine 0.44 - 1.00 mg/dL 6.46  6.71  6.89   Sodium 135 - 145 mmol/L 137  137  139   Potassium 3.5 - 5.1 mmol/L 5.0  4.7  4.5   Chloride 98 - 111 mmol/L 112  111  112   CO2 22 - 32 mmol/L 19  20  20    Calcium  8.9 - 10.3 mg/dL 8.1  7.8  7.7      Assessment/Plan:    # AKI - Most likely secondary to uncontrolled DM and nonketotic hyperosmolar state from insulin  noncompliance.  Also note nephrotic range proteinuria, microscopic hematuria, and anasarca - concerning for possible GN - Continue supportive care and optimize diabetes - hold lisinopril  as  you are - would reduce gabapentin  to 300 mg daily  - ordered strict ins/outs and daily weights    # Nephrotic range proteinuria  - UP/cr ratio is 6220 mg/g - she is most likely nephrotic range proteinuria and CKD from her uncontrolled diabetes and thus decreased renal reserve  - serologic work-up as below - SPEP and free light chains pending and UPEP is pending (listed currently under [facility] test (sendout)  - Add PLA2R antibody (under [facility] test - sendout) - She may ultimately benefit from a kidney biopsy  # Fluid volume overload - Strict ins/outs and daily weights - Assess diuretic needs daily - lasix  80 mg IV once   # CKD stage IIIb - Baseline creatinine 1.8-1.9 as 09/2023 admission - unclear if this is representative of her outpatient baseline as labs quite a bit better in March of this year though she's off meds and a diabetic    # Microscopic Hematuria - repeat UA with persistent RBC - ANA, complement, ANCA pending.  HIV non-reactive and hepatitis panel non-reactive   # Metabolic acidosis - secondary to AKI and nonketotic hyperosmolar state from insulin  noncompliance   # HTN  - lasix  again today as above   - ideal to come down on amlodipine  and hopefully come off but uncontrolled as is so keep for now    # Normocytic anemia  - may have had component from CKD - iron  deficiency - continue oral iron    Disposition - continue inpatient monitoring      Katheryn JAYSON Saba, MD 01/03/2024 2:29 PM

## 2024-01-03 NOTE — Progress Notes (Addendum)
 PROGRESS NOTE     Patient Demographics:    Sara [name], is a 48 y.o. female, DOB - 08/20/1975, FMW:[id number]  Outpatient Primary MD for the patient is Lenon Homer, MD    LOS - 3  Admit date - 12/30/2023    Chief Complaint  Patient presents with   Psychiatric Evaluation        Hyperglycemia   Hypertension    Pt non compliant with meds and having SI behavior issues at a group home.        Brief Narrative (HPI from H&P)   48 y.o. African-American female with medical history significant for GERD, depression, type 2 diabetes mellitus, H. pylori infection, hypertension, dyslipidemia and OSA, who presented to the emergency room with acute onset of psychiatric evaluation for suicidal ideation and hyperglycemia.  The patient admitted to polydipsia without significant polyuria.  No chest pain or palpitations.  No cough or wheezing or dyspnea.  No dysuria, oliguria or hematuria or flank pain.  In the ER was diagnosed with nonketotic hyperosmolar state from not taking her insulin  for a while, depression, suicidal ideation and she was admitted to the hospital.   Subjective:   Patient in bed, appears comfortable, denies any headache, no fever, no chest pain or pressure, no shortness of breath , no abdominal pain. No new focal weakness.   Assessment  & Plan :   Type 2 diabetes mellitus with hyperosmolar nonketotic hyperglycemia (HCC)  - Due to noncompliance with insulin , was depressed and not taking it for several days, initially on IV insulin  drip now transition to subcu, counseled on compliance, monitor.  Lab Results  Component Value Date   HGBA1C 8.6 (H) 10/06/2023   CBG (last 3)  Recent Labs    01/02/24 1706  01/02/24 2039 01/03/24 0820  GLUCAP 261* 270* 250*      Acute kidney injury superimposed on CKD stage IIIb with proteinuria and some hematuria  - Clinically dehydrated however has 1+ edema which is chronic, likely developing diabetic nephropathy from poorly controlled diabetes mellitus, nephrology on board defer management to nephrology, protein creatinine ratio greater  than 6, difficult proteinuria on UA as well, UA suggestive of chronic medical disease.  Defer management to nephrology.   GERD without esophagitis  - We will continue PPI therapy.   Dyslipidemia - Will continue statin therapy.   Anemia of chronic disease.  Transfuse 1 unit of packed RBC on 01/02/2024.  Monitor.  Does have some chronic intermittent hematuria due to underlying renal disease will monitor that as well.    Essential hypertension - Blood pressure medication regimen adjusted on 01/03/2024 further for better control.  Depression with suicidal ideation.  Sitter and psych eval. may require [facility] per psych.        Condition - Fair  Family Communication  : None present  Code Status :  Full  Consults  :  Psych, Renal  PUD Prophylaxis :  PPI   Procedures  :     Renal US  -       Disposition Plan  :    Status is: Inpatient  DVT Prophylaxis  :    enoxaparin  (LOVENOX ) injection 30 mg Start: 01/01/24 1000   Lab Results  Component Value Date   PLT 120 (L) 01/03/2024    Diet :  Diet Order             Diet heart healthy/carb modified Room service appropriate? Yes; Fluid consistency: Thin  Diet effective now                    Inpatient Medications  Scheduled Meds:  amLODipine   10 mg Oral Daily   aspirin  EC  81 mg Oral Q breakfast   atorvastatin   40 mg Oral QHS   carvedilol   6.25 mg Oral BID WC   cyclobenzaprine   10 mg Oral BID   enoxaparin  (LOVENOX ) injection  30 mg Subcutaneous Q24H   fluticasone  furoate-vilanterol  1 puff Inhalation Daily   gabapentin   300 mg Oral BID   hydrALAZINE    100 mg Oral Q8H   insulin  aspart  0-5 Units Subcutaneous QHS   insulin  aspart  0-6 Units Subcutaneous TID WC   insulin  glargine  14 Units Subcutaneous QHS   iron  polysaccharides  150 mg Oral Daily   LORazepam   0.5 mg Intravenous Once   melatonin  3 mg Oral QHS   montelukast   10 mg Oral QHS   pantoprazole   40 mg Oral Daily   sodium bicarbonate   650 mg Oral BID   Continuous Infusions:   PRN Meds:.acetaminophen  **OR** acetaminophen , albuterol , cyclobenzaprine , dextrose , magnesium  hydroxide, ondansetron  **OR** ondansetron  (ZOFRAN ) IV, senna-docusate, traZODone   Antibiotics  :    Anti-infectives (From admission, onward)    None         Objective:   Vitals:   01/03/24 0414 01/03/24 0500 01/03/24 0742 01/03/24 0822  BP: 135/72   (!) 164/88  Pulse: 96   (!) 105  Resp: 18   18  Temp: 97.7 F (36.5 C)   97.8 F (36.6 C)  TempSrc: Oral   Oral  SpO2: 100%  93%   Weight:  92.9 kg    Height:        Wt Readings from Last 3 Encounters:  01/03/24 92.9 kg  10/07/23 64.5 kg  04/08/23 74.8 kg     Intake/Output Summary (Last 24 hours) at 01/03/2024 0925 Last data filed at 01/02/2024 2045 Gross per 24 hour  Intake 842.5 ml  Output --  Net 842.5 ml     Physical Exam  Awake Alert, No new F.N deficits, Normal affect [place].AT,PERRAL  Supple Neck, No JVD,   Symmetrical Chest wall movement, Good air movement bilaterally, CTAB RRR,No Gallops,Rubs or new Murmurs,  +ve B.Sounds, Abd Soft, No tenderness,   1+ edema      Data Review:    Recent Labs  Lab 12/31/23 0021 12/31/23 0103 12/31/23 0624 01/02/24 0258 01/03/24 0729  WBC 12.3*  --  10.0 9.9 8.5  HGB 8.1* 8.2* 8.3* 7.7* 7.9*  HCT 25.0* 24.0* 28.1* 23.8* 24.4*  PLT 173  --  148* 151 120*  MCV 83.6  --  90.9 83.8 84.7  MCH 27.1  --  26.9 27.1 27.4  MCHC 32.4  --  29.5* 32.4 32.4  RDW 14.4  --  14.5 14.2 14.1  LYMPHSABS 2.1  --   --  2.5 1.6  MONOABS 0.9  --   --  0.7 0.6  EOSABS 0.1  --   --  0.1 0.1  BASOSABS 0.1   --   --  0.0 0.0    Recent Labs  Lab 12/31/23 0021 12/31/23 0103 12/31/23 0624 01/01/24 0229 01/02/24 0120  NA 137 140 139 139 137  K 4.4 4.4 4.0 4.5 4.7  CL 109 109 113* 112* 111  CO2 21*  --  17* 20* 20*  ANIONGAP 7  --  9 7 6   GLUCOSE 534* 498* 147* 226* 185*  BUN 28* 28* 28* 28* 32*  CREATININE 3.04* 3.10* 2.78* 3.10* 3.28*  AST 22  --   --   --   --   ALT 31  --   --   --   --   ALKPHOS 88  --   --   --   --   BILITOT 0.7  --   --   --   --   ALBUMIN  2.3*  --   --   --   --   MG  --   --   --   --  1.6*  PHOS  --   --   --   --  3.2  CALCIUM  7.9*  --  8.1* 7.7* 7.8*      Recent Labs  Lab 12/31/23 0021 12/31/23 0624 01/01/24 0229 01/02/24 0120  MG  --   --   --  1.6*  CALCIUM  7.9* 8.1* 7.7* 7.8*    --------------------------------------------------------------------------------------------------------------- Lab Results  Component Value Date   CHOL 215 (H) 01/10/2023   HDL 51 01/10/2023   LDLCALC 148 (H) 01/10/2023   TRIG 79 01/10/2023   CHOLHDL 4.2 01/10/2023    Lab Results  Component Value Date   HGBA1C 8.6 (H) 10/06/2023   No results for input(s): TSH, T4TOTAL, FREET4, T3FREE, THYROIDAB in the last 72 hours. Recent Labs    01/02/24 0120 01/02/24 0636  VITAMINB12  --  434  FOLATE 8.5  --   FERRITIN 180  --   TIBC 228*  --   IRON  41  --    ------------------------------------------------------------------------------------------------------------------ Cardiac Enzymes No results for input(s): CKMB, TROPONINI, MYOGLOBIN in the last 168 hours.  Invalid input(s): CK  Micro Results No results found for this or any previous visit (from the past 240 hours).  Radiology Report No results found.    Signature  -   Lavada Stank M.D on 01/03/2024 at 9:25 AM   -  To page go to [email or web address]

## 2024-01-03 NOTE — Plan of Care (Signed)

## 2024-01-04 DIAGNOSIS — E1100 Type 2 diabetes mellitus with hyperosmolarity without nonketotic hyperglycemic-hyperosmolar coma (NKHHC): Secondary | ICD-10-CM | POA: Diagnosis not present

## 2024-01-04 LAB — ENA+DNA/DS+ANTICH+CENTRO+JO...
Anti JO-1: <0.2 AI (ref 0.0–0.9)
Centromere Ab Screen: <0.2 AI (ref 0.0–0.9)
Chromatin Ab SerPl-aCnc: <0.2 AI (ref 0.0–0.9)
ENA SM Ab Ser-aCnc: <0.2 AI (ref 0.0–0.9)
Ribonucleic Protein: 0.6 AI (ref 0.0–0.9)
SSA (Ro) (ENA) Antibody, IgG: <0.2 AI (ref 0.0–0.9)
SSB (La) (ENA) Antibody, IgG: 2.8 AI — ABNORMAL HIGH (ref 0.0–0.9)
Scleroderma (Scl-70) (ENA) Antibody, IgG: <0.2 AI (ref 0.0–0.9)
ds DNA Ab: <1 [IU]/mL (ref 0–9)

## 2024-01-04 LAB — PROTEIN ELECTROPHORESIS, SERUM
A/G Ratio: 0.8 (ref 0.7–1.7)
Albumin ELP: 2.1 g/dL — ABNORMAL LOW (ref 2.9–4.4)
Alpha-1-Globulin: 0.2 g/dL (ref 0.0–0.4)
Alpha-2-Globulin: 0.9 g/dL (ref 0.4–1.0)
Beta Globulin: 0.9 g/dL (ref 0.7–1.3)
Gamma Globulin: 0.6 g/dL (ref 0.4–1.8)
Globulin, Total: 2.6 g/dL (ref 2.2–3.9)
Total Protein ELP: 4.7 g/dL — ABNORMAL LOW (ref 6.0–8.5)

## 2024-01-04 LAB — HEPATIC FUNCTION PANEL
ALT: 19 U/L (ref 0–44)
AST: 14 U/L — ABNORMAL LOW (ref 15–41)
Albumin: 2.1 g/dL — ABNORMAL LOW (ref 3.5–5.0)
Alkaline Phosphatase: 65 U/L (ref 38–126)
Bilirubin, Direct: <0.1 mg/dL (ref 0.0–0.2)
Total Bilirubin: 0.7 mg/dL (ref 0.0–1.2)
Total Protein: 5.3 g/dL — ABNORMAL LOW (ref 6.5–8.1)

## 2024-01-04 LAB — CBC WITH DIFFERENTIAL/PLATELET
Abs Immature Granulocytes: 0.04 K/uL (ref 0.00–0.07)
Basophils Absolute: 0 K/uL (ref 0.0–0.1)
Basophils Relative: 0 %
Eosinophils Absolute: 0.1 K/uL (ref 0.0–0.5)
Eosinophils Relative: 1 %
HCT: 25.7 % — ABNORMAL LOW (ref 36.0–46.0)
Hemoglobin: 8.5 g/dL — ABNORMAL LOW (ref 12.0–15.0)
Immature Granulocytes: 0 %
Lymphocytes Relative: 20 %
Lymphs Abs: 1.8 K/uL (ref 0.7–4.0)
MCH: 27.8 pg (ref 26.0–34.0)
MCHC: 33.1 g/dL (ref 30.0–36.0)
MCV: 84 fL (ref 80.0–100.0)
Monocytes Absolute: 0.7 K/uL (ref 0.1–1.0)
Monocytes Relative: 8 %
Neutro Abs: 6.4 K/uL (ref 1.7–7.7)
Neutrophils Relative %: 71 %
Platelets: 149 K/uL — ABNORMAL LOW (ref 150–400)
RBC: 3.06 MIL/uL — ABNORMAL LOW (ref 3.87–5.11)
RDW: 14.3 % (ref 11.5–15.5)
WBC: 9.2 K/uL (ref 4.0–10.5)
nRBC: 0 % (ref 0.0–0.2)

## 2024-01-04 LAB — BASIC METABOLIC PANEL WITH GFR
Anion gap: 9 (ref 5–15)
BUN: 37 mg/dL — ABNORMAL HIGH (ref 6–20)
CO2: 22 mmol/L (ref 22–32)
Calcium: 8.5 mg/dL — ABNORMAL LOW (ref 8.9–10.3)
Chloride: 107 mmol/L (ref 98–111)
Creatinine, Ser: 3.6 mg/dL — ABNORMAL HIGH (ref 0.44–1.00)
GFR, Estimated: 15 mL/min — ABNORMAL LOW (ref >60)
Glucose, Bld: 216 mg/dL — ABNORMAL HIGH (ref 70–99)
Potassium: 4.5 mmol/L (ref 3.5–5.1)
Sodium: 138 mmol/L (ref 135–145)

## 2024-01-04 LAB — KAPPA/LAMBDA LIGHT CHAINS
Kappa free light chain: 55 mg/L — ABNORMAL HIGH (ref 3.3–19.4)
Kappa, lambda light chain ratio: 1.47 (ref 0.26–1.65)
Lambda free light chains: 37.5 mg/L — ABNORMAL HIGH (ref 5.7–26.3)

## 2024-01-04 LAB — GLUCOSE, CAPILLARY
Glucose-Capillary: 206 mg/dL — ABNORMAL HIGH (ref 70–99)
Glucose-Capillary: 184 mg/dL — ABNORMAL HIGH (ref 70–99)
Glucose-Capillary: 319 mg/dL — ABNORMAL HIGH (ref 70–99)

## 2024-01-04 LAB — ANCA PROFILE
Anti-MPO Antibodies: <0.2 U (ref 0.0–0.9)
Anti-PR3 Antibodies: <0.2 U (ref 0.0–0.9)
Atypical P-ANCA titer: <1:20 {titer}
C-ANCA: <1:20 {titer}
P-ANCA: <1:20 {titer}

## 2024-01-04 LAB — C4 COMPLEMENT: Complement C4, Body Fluid: 40 mg/dL — ABNORMAL HIGH (ref 12–38)

## 2024-01-04 LAB — ANA W/REFLEX IF POSITIVE: Anti Nuclear Antibody (ANA): POSITIVE — AB

## 2024-01-04 LAB — PHOSPHORUS: Phosphorus: 4.3 mg/dL (ref 2.5–4.6)

## 2024-01-04 LAB — MAGNESIUM: Magnesium: 2.1 mg/dL (ref 1.7–2.4)

## 2024-01-04 LAB — C3 COMPLEMENT: C3 Complement: 181 mg/dL — ABNORMAL HIGH (ref 82–167)

## 2024-01-04 MED ORDER — INSULIN GLARGINE 100 UNIT/ML ~~LOC~~ SOLN
16.0000 [IU] | Freq: Every day | SUBCUTANEOUS | Status: DC
  Administered 2024-01-05 – 2024-01-06 (×2): 16 [IU] via SUBCUTANEOUS
  Filled 2024-01-04 (×4): qty 0.16

## 2024-01-04 MED ORDER — ASPIRIN 81 MG PO TBEC: 81.0000 mg | DELAYED_RELEASE_TABLET | Freq: Every day | ORAL | Status: DC

## 2024-01-04 NOTE — Progress Notes (Signed)
 [facility] Progress Note   Assessment/ Plan:   # AKI - Most likely secondary to uncontrolled DM and nonketotic hyperosmolar state from insulin  noncompliance.  Also note nephrotic range proteinuria, microscopic hematuria, and anasarca - concerning for possible GN - Continue supportive care and optimize diabetes - hold lisinopril  as you are - would reduce gabapentin  to 300 mg daily  - ordered strict ins/outs and daily weights    # Nephrotic range proteinuria  - UP/cr ratio is 6220 mg/g - she is most likely nephrotic range proteinuria and CKD from her uncontrolled diabetes and thus decreased renal reserve  - serologic work-up as below - SPEP and free light chains pending and UPEP is pending (listed currently under misc [facility] test (sendout)  - Add PLA2R antibody (under misc [facility] test - sendout) Agreeable to biopsy Have ordered    # Fluid volume overload - Strict ins/outs and daily weights - Assess diuretic needs daily - lasix  80 mg IV once   # CKD stage IIIb - Baseline creatinine 1.8-1.9 as 09/2023 admission - unclear if this is representative of her outpatient baseline as labs quite a bit better in March of this year though she's off meds and a diabetic    # Microscopic Hematuria - repeat UA with persistent RBC - ANA, complement, ANCA pending.  HIV non-reactive and hepatitis panel non-reactive   # Metabolic acidosis - secondary to AKI and nonketotic hyperosmolar state from insulin  noncompliance   # HTN  - lasix  again today as above   - ideal to come down on amlodipine  and hopefully come off but uncontrolled as is so keep for now    # Normocytic anemia  - may have had component from CKD - iron  deficiency - continue oral iron     Subjective:    Seen in room- discussion re: biopsy today willing to proceed.     Objective:   BP 133/76 (BP Location: Right Arm)   Pulse (!) 102   Temp 98.5 F (36.9 C) (Oral)   Resp 18   Ht 5' 8 (1.727 m)   Wt 91.6 kg    SpO2 95%   BMI 30.71 kg/m   Intake/Output Summary (Last 24 hours) at 01/04/2024 1035 Last data filed at 01/04/2024 0654 Gross per 24 hour  Intake --  Output 1300 ml  Net -1300 ml   Weight change: -0.253 kg  Physical Exam: Gen:NAD HEENT: periorbital edema CVS: RRR Resp:nonlabored Abd: soft Ext: + anasarca  Imaging: No results found.  Labs: BMET Recent Labs  Lab 12/31/23 0021 12/31/23 0103 12/31/23 9375 01/01/24 0229 01/02/24 0120 01/03/24 0729 01/04/24 0702  NA 137 140 139 139 137 137 138  K 4.4 4.4 4.0 4.5 4.7 5.0 4.5  CL 109 109 113* 112* 111 112* 107  CO2 21*  --  17* 20* 20* 19* 22  GLUCOSE 534* 498* 147* 226* 185* 235* 216*  BUN 28* 28* 28* 28* 32* 38* 37*  CREATININE 3.04* 3.10* 2.78* 3.10* 3.28* 3.53* 3.60*  CALCIUM  7.9*  --  8.1* 7.7* 7.8* 8.1* 8.5*  PHOS  --   --   --   --  3.2 4.1 4.3   CBC Recent Labs  Lab 12/31/23 0021 12/31/23 0103 12/31/23 0624 01/02/24 0258 01/03/24 0729 01/04/24 0702  WBC 12.3*  --  10.0 9.9 8.5 9.2  NEUTROABS 9.1*  --   --  6.5 6.1 6.4  HGB 8.1*   < > 8.3* 7.7* 7.9* 8.5*  HCT 25.0*   < >  28.1* 23.8* 24.4* 25.7*  MCV 83.6  --  90.9 83.8 84.7 84.0  PLT 173  --  148* 151 120* 149*   < > = values in this interval not displayed.    Medications:     amLODipine   10 mg Oral Daily   aspirin  EC  81 mg Oral Q breakfast   atorvastatin   40 mg Oral QHS   carvedilol   6.25 mg Oral BID WC   cyclobenzaprine   10 mg Oral BID   enoxaparin  (LOVENOX ) injection  30 mg Subcutaneous Q24H   fluticasone  furoate-vilanterol  1 puff Inhalation Daily   gabapentin   300 mg Oral BID   hydrALAZINE   100 mg Oral Q8H   insulin  aspart  0-5 Units Subcutaneous QHS   insulin  aspart  0-6 Units Subcutaneous TID WC   insulin  glargine  16 Units Subcutaneous Daily   iron  polysaccharides  150 mg Oral Daily   LORazepam   0.5 mg Intravenous Once   melatonin  3 mg Oral QHS   montelukast   10 mg Oral QHS   pantoprazole   40 mg Oral Daily   sodium bicarbonate    650 mg Oral BID    Almarie Bonine, MD 01/04/2024, 10:35 AM

## 2024-01-04 NOTE — Progress Notes (Signed)
 PROGRESS NOTE     Patient Demographics:    Sara [name], is a 48 y.o. female, DOB - [birth year], FMW:[id number]  Outpatient Primary MD for the patient is Lenon Homer, MD    LOS - 4  Admit date - 12/30/2023    Chief Complaint  Patient presents with   Psychiatric Evaluation        Hyperglycemia   Hypertension    Pt non compliant with meds and having SI behavior issues at a group home.        Brief Narrative (HPI from H&P)   48 y.o. African-American female with medical history significant for GERD, depression, type 2 diabetes mellitus, H. pylori infection, hypertension, dyslipidemia and OSA, who presented to the emergency room with acute onset of psychiatric evaluation for suicidal ideation and hyperglycemia.  The patient admitted to polydipsia without significant polyuria.  No chest pain or palpitations.  No cough or wheezing or dyspnea.  No dysuria, oliguria or hematuria or flank pain.  In the ER was diagnosed with nonketotic hyperosmolar state from not taking her insulin  for a while, depression, suicidal ideation and she was admitted to the hospital.   Subjective:   Patient in bed, appears comfortable, denies any headache, no fever, no chest pain or pressure, no shortness of breath , no abdominal pain. No focal weakness.  Not suicidal or homicidal.   Assessment  & Plan :   Type 2 diabetes mellitus with hyperosmolar nonketotic hyperglycemia (HCC)  - Due to noncompliance with insulin , was depressed and not taking it for several days, initially on IV insulin  drip now transition to subcu, counseled on compliance, dose adjusted further on 01/04/2024, continue to monitor.  Lab Results  Component Value Date   HGBA1C 8.6  (H) 10/06/2023   CBG (last 3)  Recent Labs    01/03/24 1636 01/03/24 2033 01/03/24 2320  GLUCAP 233* 257* 224*      Acute kidney injury superimposed on CKD stage IIIb with proteinuria and some hematuria  - Clinically dehydrated however has 1+ edema which is chronic, likely developing diabetic nephropathy from poorly controlled diabetes mellitus,  nephrology on board defer management to nephrology, protein creatinine ratio greater than 6, difficult proteinuria on UA as well, UA suggestive of chronic medical disease.  Defer management to nephrology.   GERD without esophagitis  - We will continue PPI therapy.   Dyslipidemia - Will continue statin therapy.   Anemia of chronic disease.  Transfuse 1 unit of packed RBC on 01/02/2024.  Monitor.  Does have some chronic intermittent hematuria due to underlying renal disease will monitor that as well.    Essential hypertension - Blood pressure medication regimen adjusted on 01/03/2024 further for better control.  Depression with suicidal ideation.  Sitter and psych eval. may require [facility] per psych.        Condition - Fair  Family Communication  : None present  Code Status :  Full  Consults  :  Psych, Renal  PUD Prophylaxis :  PPI   Procedures  :     Renal US  -  medical renal disease      Disposition Plan  :    Status is: Inpatient  DVT Prophylaxis  :    enoxaparin  (LOVENOX ) injection 30 mg Start: 01/01/24 1000   Lab Results  Component Value Date   PLT 120 (L) 01/03/2024    Diet :  Diet Order             Diet heart healthy/carb modified Room service appropriate? Yes; Fluid consistency: Thin  Diet effective now                    Inpatient Medications  Scheduled Meds:  amLODipine   10 mg Oral Daily   aspirin  EC  81 mg Oral Q breakfast   atorvastatin   40 mg Oral QHS   carvedilol   6.25 mg Oral BID WC   cyclobenzaprine   10 mg Oral BID   enoxaparin  (LOVENOX ) injection  30 mg Subcutaneous Q24H   fluticasone   furoate-vilanterol  1 puff Inhalation Daily   gabapentin   300 mg Oral BID   hydrALAZINE   100 mg Oral Q8H   insulin  aspart  0-5 Units Subcutaneous QHS   insulin  aspart  0-6 Units Subcutaneous TID WC   insulin  glargine  16 Units Subcutaneous Daily   iron  polysaccharides  150 mg Oral Daily   LORazepam   0.5 mg Intravenous Once   melatonin  3 mg Oral QHS   montelukast   10 mg Oral QHS   pantoprazole   40 mg Oral Daily   sodium bicarbonate   650 mg Oral BID   Continuous Infusions:   PRN Meds:.acetaminophen  **OR** acetaminophen , albuterol , cyclobenzaprine , dextrose , ondansetron  **OR** ondansetron  (ZOFRAN ) IV, senna-docusate, traZODone   Antibiotics  :    Anti-infectives (From admission, onward)    None         Objective:   Vitals:   01/03/24 2037 01/03/24 2318 01/04/24 0500 01/04/24 0525  BP: 118/64 123/68  127/76  Pulse: 96 (!) 101  99  Resp: 17 16  15   Temp: 98.5 F (36.9 C) 99 F (37.2 C)  98.4 F (36.9 C)  TempSrc: Oral     SpO2: 96% 98%  99%  Weight:   91.6 kg   Height:        Wt Readings from Last 3 Encounters:  01/04/24 91.6 kg  10/07/23 64.5 kg  04/08/23 74.8 kg     Intake/Output Summary (Last 24 hours) at 01/04/2024 0717 Last data filed at 01/04/2024 0654 Gross per 24 hour  Intake --  Output 1300 ml  Net -1300 ml  Physical Exam  Awake Alert, No new F.N deficits, Normal affect [place].AT,PERRAL Supple Neck, No JVD,   Symmetrical Chest wall movement, Good air movement bilaterally, CTAB RRR,No Gallops,Rubs or new Murmurs,  +ve B.Sounds, Abd Soft, No tenderness,   1+ edema      Data Review:    Recent Labs  Lab 12/31/23 0021 12/31/23 0103 12/31/23 0624 01/02/24 0258 01/03/24 0729  WBC 12.3*  --  10.0 9.9 8.5  HGB 8.1* 8.2* 8.3* 7.7* 7.9*  HCT 25.0* 24.0* 28.1* 23.8* 24.4*  PLT 173  --  148* 151 120*  MCV 83.6  --  90.9 83.8 84.7  MCH 27.1  --  26.9 27.1 27.4  MCHC 32.4  --  29.5* 32.4 32.4  RDW 14.4  --  14.5 14.2 14.1  LYMPHSABS 2.1  --    --  2.5 1.6  MONOABS 0.9  --   --  0.7 0.6  EOSABS 0.1  --   --  0.1 0.1  BASOSABS 0.1  --   --  0.0 0.0    Recent Labs  Lab 12/31/23 0021 12/31/23 0103 12/31/23 0624 01/01/24 0229 01/02/24 0120 01/03/24 0729  NA 137 140 139 139 137 137  K 4.4 4.4 4.0 4.5 4.7 5.0  CL 109 109 113* 112* 111 112*  CO2 21*  --  17* 20* 20* 19*  ANIONGAP 7  --  9 7 6 6   GLUCOSE 534* 498* 147* 226* 185* 235*  BUN 28* 28* 28* 28* 32* 38*  CREATININE 3.04* 3.10* 2.78* 3.10* 3.28* 3.53*  AST 22  --   --   --   --   --   ALT 31  --   --   --   --   --   ALKPHOS 88  --   --   --   --   --   BILITOT 0.7  --   --   --   --   --   ALBUMIN  2.3*  --   --   --   --   --   MG  --   --   --   --  1.6* 2.3  PHOS  --   --   --   --  3.2 4.1  CALCIUM  7.9*  --  8.1* 7.7* 7.8* 8.1*      Recent Labs  Lab 12/31/23 0021 12/31/23 0624 01/01/24 0229 01/02/24 0120 01/03/24 0729  MG  --   --   --  1.6* 2.3  CALCIUM  7.9* 8.1* 7.7* 7.8* 8.1*    --------------------------------------------------------------------------------------------------------------- Lab Results  Component Value Date   CHOL 215 (H) 01/10/2023   HDL 51 01/10/2023   LDLCALC 148 (H) 01/10/2023   TRIG 79 01/10/2023   CHOLHDL 4.2 01/10/2023    Lab Results  Component Value Date   HGBA1C 8.6 (H) 10/06/2023   No results for input(s): TSH, T4TOTAL, FREET4, T3FREE, THYROIDAB in the last 72 hours. Recent Labs    01/02/24 0120 01/02/24 0636  VITAMINB12  --  434  FOLATE 8.5  --   FERRITIN 180  --   TIBC 228*  --   IRON  41  --    ------------------------------------------------------------------------------------------------------------------ Cardiac Enzymes No results for input(s): CKMB, TROPONINI, MYOGLOBIN in the last 168 hours.  Invalid input(s): CK  Micro Results No results found for this or any previous visit (from the past 240 hours).  Radiology Report No results found.    Signature  -   Lavada Stank  M.D on 01/04/2024 at 7:17 AM   -  To page go to [email or web address]

## 2024-01-04 NOTE — Plan of Care (Signed)

## 2024-01-04 NOTE — Progress Notes (Signed)
 PT REFUSED HER MORNING INSULIN , COREG  AND NORVASC . MD MADE AWARE.
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
## 2024-01-05 LAB — GLUCOSE, CAPILLARY
Glucose-Capillary: 237 mg/dL — ABNORMAL HIGH (ref 70–99)
Glucose-Capillary: 210 mg/dL — ABNORMAL HIGH (ref 70–99)
Glucose-Capillary: 141 mg/dL — ABNORMAL HIGH (ref 70–99)
Glucose-Capillary: 243 mg/dL — ABNORMAL HIGH (ref 70–99)

## 2024-01-05 LAB — CBC WITH DIFFERENTIAL/PLATELET
Abs Immature Granulocytes: 0.04 K/uL (ref 0.00–0.07)
Basophils Absolute: 0 K/uL (ref 0.0–0.1)
Basophils Relative: 0 %
Eosinophils Absolute: 0.1 K/uL (ref 0.0–0.5)
Eosinophils Relative: 2 %
HCT: 27.1 % — ABNORMAL LOW (ref 36.0–46.0)
Hemoglobin: 8.7 g/dL — ABNORMAL LOW (ref 12.0–15.0)
Immature Granulocytes: 1 %
Lymphocytes Relative: 18 %
Lymphs Abs: 1.3 K/uL (ref 0.7–4.0)
MCH: 27.4 pg (ref 26.0–34.0)
MCHC: 32.1 g/dL (ref 30.0–36.0)
MCV: 85.2 fL (ref 80.0–100.0)
Monocytes Absolute: 0.6 K/uL (ref 0.1–1.0)
Monocytes Relative: 9 %
Neutro Abs: 4.8 K/uL (ref 1.7–7.7)
Neutrophils Relative %: 70 %
Platelets: 152 K/uL (ref 150–400)
RBC: 3.18 MIL/uL — ABNORMAL LOW (ref 3.87–5.11)
RDW: 14.2 % (ref 11.5–15.5)
WBC: 6.8 K/uL (ref 4.0–10.5)
nRBC: 0 % (ref 0.0–0.2)

## 2024-01-05 LAB — BASIC METABOLIC PANEL WITH GFR
Anion gap: 15 (ref 5–15)
BUN: 36 mg/dL — ABNORMAL HIGH (ref 6–20)
CO2: 14 mmol/L — ABNORMAL LOW (ref 22–32)
Calcium: 8.2 mg/dL — ABNORMAL LOW (ref 8.9–10.3)
Chloride: 109 mmol/L (ref 98–111)
Creatinine, Ser: 3.69 mg/dL — ABNORMAL HIGH (ref 0.44–1.00)
GFR, Estimated: 14 mL/min — ABNORMAL LOW (ref >60)
Glucose, Bld: 274 mg/dL — ABNORMAL HIGH (ref 70–99)
Potassium: 4.5 mmol/L (ref 3.5–5.1)
Sodium: 138 mmol/L (ref 135–145)

## 2024-01-05 LAB — MAGNESIUM: Magnesium: 2.1 mg/dL (ref 1.7–2.4)

## 2024-01-05 LAB — PHOSPHORUS: Phosphorus: 4.7 mg/dL — ABNORMAL HIGH (ref 2.5–4.6)

## 2024-01-05 MED ORDER — TORSEMIDE 20 MG PO TABS
40.0000 mg | ORAL_TABLET | Freq: Every day | ORAL | Status: DC
  Administered 2024-01-05 – 2024-01-17 (×12): 40 mg via ORAL
  Filled 2024-01-05 (×13): qty 2

## 2024-01-05 MED ORDER — ACETAMINOPHEN 325 MG PO TABS: 325.0000 mg | ORAL_TABLET | Freq: Once | ORAL | Status: DC

## 2024-01-05 MED ORDER — SALINE SPRAY 0.65 % NA SOLN
2.0000 | NASAL | Status: DC | PRN
  Administered 2024-01-05: 2 via NASAL
  Filled 2024-01-05: qty 44
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
## 2024-01-05 NOTE — Progress Notes (Signed)
 [facility] Progress Note   Assessment/ Plan:   # AKI on CKD 3b - baseline Cr 1.8-1.9 - Most likely secondary to uncontrolled DM and nonketotic hyperosmolar state from insulin  noncompliance.  Also note nephrotic range proteinuria, microscopic hematuria, and anasarca - concerning for possible GN - Continue supportive care and optimize diabetes -hold ACEi - start torsemide  40 mg daily - biopsy after ASA washout- Monday at earliest  # Nephrotic range proteinuria  - UP/cr ratio is 6220 mg/g - she is most likely nephrotic range proteinuria and CKD from her uncontrolled diabetes and thus decreased renal reserve  - serologic work-up as below - SPEP and free light chains pending and UPEP is pending (listed currently under misc [facility] test (sendout)  - Add PLA2R antibody (under misc [facility] test - sendout) Agreeable to biopsy Have ordered    # Fluid volume overload - Strict ins/outs and daily weights - start PO torsemide  40 mg daily    # Microscopic Hematuria - repeat UA with persistent RBC - ANA, complement, ANCA pending.  HIV non-reactive and hepatitis panel non-reactive   # Metabolic acidosis - secondary to AKI and nonketotic hyperosmolar state from insulin  noncompliance   # HTN  -torsemide  will help with this   # Normocytic anemia  - may have had component from CKD - iron  deficiency - continue oral iron     Subjective:    Seen in room- doing better, looks perkier today.  D/w IR- pt was on ASA- stopped- earliest bx will be Monday.    Objective:   BP 137/78 (BP Location: Right Arm)   Pulse (!) 102   Temp 98.5 F (36.9 C)   Resp 18   Ht 5' 8 (1.727 m)   Wt 89 kg   SpO2 98%   BMI 29.83 kg/m   Intake/Output Summary (Last 24 hours) at 01/05/2024 1420 Last data filed at 01/05/2024 0907 Gross per 24 hour  Intake 480 ml  Output --  Net 480 ml   Weight change: -2.6 kg  Physical Exam: Gen:NAD HEENT: periorbital edema CVS: RRR Resp:nonlabored Abd:  soft Ext: + anasarca  Imaging: No results found.  Labs: BMET Recent Labs  Lab 12/31/23 0021 12/31/23 0103 12/31/23 9375 01/01/24 0229 01/02/24 0120 01/03/24 0729 01/04/24 0702 01/05/24 0316  NA 137 140 139 139 137 137 138 138  K 4.4 4.4 4.0 4.5 4.7 5.0 4.5 4.5  CL 109 109 113* 112* 111 112* 107 109  CO2 21*  --  17* 20* 20* 19* 22 14*  GLUCOSE 534* 498* 147* 226* 185* 235* 216* 274*  BUN 28* 28* 28* 28* 32* 38* 37* 36*  CREATININE 3.04* 3.10* 2.78* 3.10* 3.28* 3.53* 3.60* 3.69*  CALCIUM  7.9*  --  8.1* 7.7* 7.8* 8.1* 8.5* 8.2*  PHOS  --   --   --   --  3.2 4.1 4.3 4.7*   CBC Recent Labs  Lab 01/02/24 0258 01/03/24 0729 01/04/24 0702 01/05/24 0316  WBC 9.9 8.5 9.2 6.8  NEUTROABS 6.5 6.1 6.4 4.8  HGB 7.7* 7.9* 8.5* 8.7*  HCT 23.8* 24.4* 25.7* 27.1*  MCV 83.8 84.7 84.0 85.2  PLT 151 120* 149* 152    Medications:     amLODipine   10 mg Oral Daily   atorvastatin   40 mg Oral QHS   carvedilol   6.25 mg Oral BID WC   cyclobenzaprine   10 mg Oral BID   enoxaparin  (LOVENOX ) injection  30 mg Subcutaneous Q24H   fluticasone  furoate-vilanterol  1 puff  Inhalation Daily   gabapentin   300 mg Oral BID   hydrALAZINE   100 mg Oral Q8H   insulin  aspart  0-5 Units Subcutaneous QHS   insulin  aspart  0-6 Units Subcutaneous TID WC   insulin  glargine  16 Units Subcutaneous Daily   iron  polysaccharides  150 mg Oral Daily   LORazepam   0.5 mg Intravenous Once   melatonin  3 mg Oral QHS   montelukast   10 mg Oral QHS   pantoprazole   40 mg Oral Daily   sodium bicarbonate   650 mg Oral BID   torsemide   40 mg Oral Daily    Almarie Bonine, MD 01/05/2024, 2:20 PM

## 2024-01-05 NOTE — Plan of Care (Signed)

## 2024-01-05 NOTE — Progress Notes (Signed)
 PROGRESS NOTE     Patient Demographics:    Sara [name], is a 48 y.o. female, DOB - 1975-10-03, FMW:[id number]  Outpatient Primary MD for the patient is Lenon Homer, MD    LOS - 5  Admit date - 12/30/2023    Chief Complaint  Patient presents with   Psychiatric Evaluation        Hyperglycemia   Hypertension    Pt non compliant with meds and having SI behavior issues at a group home.        Brief Narrative (HPI from H&P)   48 y.o. African-American female with medical history significant for GERD, depression, type 2 diabetes mellitus, H. pylori infection, hypertension, dyslipidemia and OSA, who presented to the emergency room with acute onset of psychiatric evaluation for suicidal ideation and hyperglycemia.  The patient admitted to polydipsia without significant polyuria.  No chest pain or palpitations.  No cough or wheezing or dyspnea.  No dysuria, oliguria or hematuria or flank pain.  In the ER was diagnosed with nonketotic hyperosmolar state from not taking her insulin  for a while, depression, suicidal ideation and she was admitted to the hospital.   Subjective:   Patient in bed, appears comfortable, denies any headache, no fever, no chest pain or pressure, no shortness of breath , no abdominal pain. No focal weakness.  Currently not suicidal or homicidal.  Counseled on compliance with medications.   Assessment  & Plan :   Type 2 diabetes mellitus with hyperosmolar nonketotic hyperglycemia (HCC)  - Due to noncompliance with insulin , was depressed and not taking it for several days, initially on IV insulin  drip now transition to subcu, counseled on compliance, dose adjusted further on 01/04/2024, continue to monitor.   Note patient frequently refuses her insulin  and blood pressure medications, will not titrate unless she is compliant as risk of hypoglycemia is very high.  She has been counseled.  Lab Results  Component Value Date   HGBA1C 8.6 (H) 10/06/2023   CBG (last 3)  Recent Labs    01/04/24 0736 01/04/24 1245 01/04/24 2055  GLUCAP 206* 184* 319*  Acute kidney injury superimposed on CKD stage IIIb with proteinuria and some hematuria  - Clinically dehydrated however has 1+ edema which is chronic, likely developing diabetic nephropathy from poorly controlled diabetes mellitus, nephrology on board defer management to nephrology, protein creatinine ratio greater than 6, difficult proteinuria on UA as well, UA suggestive of chronic medical disease.  Nephrology and IR following due for renal biopsy after aspirin  clearance on coming Monday.   GERD without esophagitis  - We will continue PPI therapy.   Dyslipidemia - Will continue statin therapy.   Anemia of chronic disease.  Transfuse 1 unit of packed RBC on 01/02/2024.  Monitor.  Does have some chronic intermittent hematuria due to underlying renal disease will monitor that as well.    Essential hypertension - Blood pressure medication regimen adjusted on 01/03/2024 further for better control.  Depression with suicidal ideation.  Sitter and psych eval. may require [facility] per psych.        Condition - Fair  Family Communication  : None present  Code Status :  Full  Consults  :  Psych, Renal  PUD Prophylaxis :  PPI   Procedures  :     Renal US  -  medical renal disease      Disposition Plan  :    Status is: Inpatient  DVT Prophylaxis  :    enoxaparin  (LOVENOX ) injection 30 mg Start: 01/01/24 1000   Lab Results  Component Value Date   PLT 152 01/05/2024    Diet :  Diet Order             Diet heart healthy/carb modified Room service appropriate? Yes; Fluid consistency: Thin  Diet effective now                     Inpatient Medications  Scheduled Meds:  amLODipine   10 mg Oral Daily   [START ON 01/13/2024] aspirin  EC  81 mg Oral Q breakfast   atorvastatin   40 mg Oral QHS   carvedilol   6.25 mg Oral BID WC   cyclobenzaprine   10 mg Oral BID   enoxaparin  (LOVENOX ) injection  30 mg Subcutaneous Q24H   fluticasone  furoate-vilanterol  1 puff Inhalation Daily   gabapentin   300 mg Oral BID   hydrALAZINE   100 mg Oral Q8H   insulin  aspart  0-5 Units Subcutaneous QHS   insulin  aspart  0-6 Units Subcutaneous TID WC   insulin  glargine  16 Units Subcutaneous Daily   iron  polysaccharides  150 mg Oral Daily   LORazepam   0.5 mg Intravenous Once   melatonin  3 mg Oral QHS   montelukast   10 mg Oral QHS   pantoprazole   40 mg Oral Daily   sodium bicarbonate   650 mg Oral BID   Continuous Infusions:   PRN Meds:.acetaminophen  **OR** acetaminophen , albuterol , cyclobenzaprine , dextrose , ondansetron  **OR** ondansetron  (ZOFRAN ) IV, senna-docusate, sodium chloride , traZODone   Antibiotics  :    Anti-infectives (From admission, onward)    None         Objective:   Vitals:   01/04/24 2121 01/05/24 0124 01/05/24 0459 01/05/24 0500  BP: (!) 154/79 128/79 137/77   Pulse: 98 (!) 104 (!) 103   Resp:  17    Temp:  98.5 F (36.9 C) 98 F (36.7 C)   TempSrc:  Oral Oral   SpO2:  100% 100%   Weight:    89 kg  Height:        Wt Readings from Last 3 Encounters:  01/05/24 89 kg  10/07/23 64.5 kg  04/08/23 74.8 kg     Intake/Output Summary (Last 24 hours) at 01/05/2024 0740 Last data filed at 01/04/2024 1700 Gross per 24 hour  Intake 360 ml  Output --  Net 360 ml     Physical Exam  Awake Alert, No new F.N deficits, Normal affect [place].AT,PERRAL Supple Neck, No JVD,   Symmetrical Chest wall movement, Good air movement bilaterally, CTAB RRR,No Gallops,Rubs or new Murmurs,  +ve B.Sounds, Abd Soft, No tenderness,   1+ edema      Data Review:    Recent Labs  Lab 12/31/23 0021 12/31/23 0103  12/31/23 9375 01/02/24 0258 01/03/24 0729 01/04/24 0702 01/05/24 0316  WBC 12.3*  --  10.0 9.9 8.5 9.2 6.8  HGB 8.1*   < > 8.3* 7.7* 7.9* 8.5* 8.7*  HCT 25.0*   < > 28.1* 23.8* 24.4* 25.7* 27.1*  PLT 173  --  148* 151 120* 149* 152  MCV 83.6  --  90.9 83.8 84.7 84.0 85.2  MCH 27.1  --  26.9 27.1 27.4 27.8 27.4  MCHC 32.4  --  29.5* 32.4 32.4 33.1 32.1  RDW 14.4  --  14.5 14.2 14.1 14.3 14.2  LYMPHSABS 2.1  --   --  2.5 1.6 1.8 1.3  MONOABS 0.9  --   --  0.7 0.6 0.7 0.6  EOSABS 0.1  --   --  0.1 0.1 0.1 0.1  BASOSABS 0.1  --   --  0.0 0.0 0.0 0.0   < > = values in this interval not displayed.    Recent Labs  Lab 12/31/23 0021 12/31/23 0103 01/01/24 0229 01/02/24 0120 01/03/24 0729 01/04/24 0702 01/05/24 0316  NA 137   < > 139 137 137 138 138  K 4.4   < > 4.5 4.7 5.0 4.5 4.5  CL 109   < > 112* 111 112* 107 109  CO2 21*   < > 20* 20* 19* 22 14*  ANIONGAP 7   < > 7 6 6 9 15   GLUCOSE 534*   < > 226* 185* 235* 216* 274*  BUN 28*   < > 28* 32* 38* 37* 36*  CREATININE 3.04*   < > 3.10* 3.28* 3.53* 3.60* 3.69*  AST 22  --   --   --   --  14*  --   ALT 31  --   --   --   --  19  --   ALKPHOS 88  --   --   --   --  65  --   BILITOT 0.7  --   --   --   --  0.7  --   ALBUMIN  2.3*  --   --   --   --  2.1*  --   MG  --   --   --  1.6* 2.3 2.1 2.1  PHOS  --   --   --  3.2 4.1 4.3 4.7*  CALCIUM  7.9*   < > 7.7* 7.8* 8.1* 8.5* 8.2*   < > = values in this interval not displayed.      Recent Labs  Lab 01/01/24 0229 01/02/24 0120 01/03/24 0729 01/04/24 0702 01/05/24 0316  MG  --  1.6* 2.3 2.1 2.1  CALCIUM  7.7* 7.8* 8.1* 8.5* 8.2*    --------------------------------------------------------------------------------------------------------------- Lab Results  Component Value Date   CHOL 215 (H) 01/10/2023   HDL 51 01/10/2023   LDLCALC 148 (H) 01/10/2023  TRIG 79 01/10/2023   CHOLHDL 4.2 01/10/2023    Lab Results  Component Value Date   HGBA1C 8.6 (H) 10/06/2023   No  results for input(s): TSH, T4TOTAL, FREET4, T3FREE, THYROIDAB in the last 72 hours. No results for input(s): VITAMINB12, FOLATE, FERRITIN, TIBC, IRON , RETICCTPCT in the last 72 hours.  ------------------------------------------------------------------------------------------------------------------ Cardiac Enzymes No results for input(s): CKMB, TROPONINI, MYOGLOBIN in the last 168 hours.  Invalid input(s): CK  Micro Results No results found for this or any previous visit (from the past 240 hours).  Radiology Report No results found.    Signature  -   Lavada Stank M.D on 01/05/2024 at 7:40 AM   -  To page go to [email or web address]

## 2024-01-06 LAB — GLUCOSE, CAPILLARY
Glucose-Capillary: 135 mg/dL — ABNORMAL HIGH (ref 70–99)
Glucose-Capillary: 213 mg/dL — ABNORMAL HIGH (ref 70–99)
Glucose-Capillary: 180 mg/dL — ABNORMAL HIGH (ref 70–99)
Glucose-Capillary: 251 mg/dL — ABNORMAL HIGH (ref 70–99)
Glucose-Capillary: 227 mg/dL — ABNORMAL HIGH (ref 70–99)

## 2024-01-06 LAB — CBC WITH DIFFERENTIAL/PLATELET
Abs Immature Granulocytes: 0.04 K/uL (ref 0.00–0.07)
Basophils Absolute: 0 K/uL (ref 0.0–0.1)
Basophils Relative: 1 %
Eosinophils Absolute: 0.1 K/uL (ref 0.0–0.5)
Eosinophils Relative: 2 %
HCT: 25.9 % — ABNORMAL LOW (ref 36.0–46.0)
Hemoglobin: 8.2 g/dL — ABNORMAL LOW (ref 12.0–15.0)
Immature Granulocytes: 1 %
Lymphocytes Relative: 18 %
Lymphs Abs: 1.5 K/uL (ref 0.7–4.0)
MCH: 26.9 pg (ref 26.0–34.0)
MCHC: 31.7 g/dL (ref 30.0–36.0)
MCV: 84.9 fL (ref 80.0–100.0)
Monocytes Absolute: 0.9 K/uL (ref 0.1–1.0)
Monocytes Relative: 10 %
Neutro Abs: 6.1 K/uL (ref 1.7–7.7)
Neutrophils Relative %: 68 %
Platelets: 174 K/uL (ref 150–400)
RBC: 3.05 MIL/uL — ABNORMAL LOW (ref 3.87–5.11)
RDW: 14.1 % (ref 11.5–15.5)
WBC: 8.7 K/uL (ref 4.0–10.5)
nRBC: 0 % (ref 0.0–0.2)

## 2024-01-06 LAB — MISC LABCORP TEST (SEND OUT)

## 2024-01-06 LAB — BASIC METABOLIC PANEL WITH GFR
Anion gap: 9 (ref 5–15)
BUN: 36 mg/dL — ABNORMAL HIGH (ref 6–20)
CO2: 21 mmol/L — ABNORMAL LOW (ref 22–32)
Calcium: 8.2 mg/dL — ABNORMAL LOW (ref 8.9–10.3)
Chloride: 109 mmol/L (ref 98–111)
Creatinine, Ser: 4.26 mg/dL — ABNORMAL HIGH (ref 0.44–1.00)
GFR, Estimated: 12 mL/min — ABNORMAL LOW (ref >60)
Glucose, Bld: 149 mg/dL — ABNORMAL HIGH (ref 70–99)
Potassium: 4.4 mmol/L (ref 3.5–5.1)
Sodium: 139 mmol/L (ref 135–145)

## 2024-01-06 NOTE — Progress Notes (Signed)
 [facility] Progress Note   Assessment/ Plan:   # AKI on CKD 3b/IV - Most likely secondary to uncontrolled DM and nonketotic hyperosmolar state from insulin  noncompliance.  Also note nephrotic range proteinuria, microscopic hematuria, and anasarca - concerning for possible GN - Continue supportive care  - hold lisinopril  - would reduce gabapentin  to 300 mg daily  - ordered strict ins/outs and daily weights    # Nephrotic range proteinuria  - UP/cr ratio is 6220 mg/g - she is most likely nephrotic range proteinuria and CKD from her uncontrolled diabetes and thus decreased renal reserve  - serologic work-up as below - SPEP and free light chains pending and UPEP is pending (listed currently under misc [facility] test (sendout)  - Add PLA2R antibody (under misc [facility] test - sendout) Agreeable to biopsy Have ordered    # Fluid volume overload - Strict ins/outs and daily weights    # Microscopic Hematuria - repeat UA with persistent RBC - ANA, complement, ANCA pending.  HIV non-reactive and hepatitis panel non-reactive   # Metabolic acidosis - secondary to AKI and nonketotic hyperosmolar state from insulin  noncompliance   # HTN  - slightly higher today   # Normocytic anemia  - may have had component from CKD - iron  deficiency - continue oral iron     Subjective:    Gave torsemide  yesterday- Cr bumped again- I discussed albumin  with her today and she does not wish to receive this product.  Awaiting ASA washout for biopsy.  She expresses frustration- says she thinks her kidneys are shot   Objective:   BP (!) 154/88   Pulse (!) 112   Temp 98.2 F (36.8 C)   Resp 16   Ht 5' 8 (1.727 m)   Wt 88.8 kg   SpO2 100%   BMI 29.77 kg/m   Intake/Output Summary (Last 24 hours) at 01/06/2024 1451 Last data filed at 01/06/2024 0913 Gross per 24 hour  Intake 500 ml  Output --  Net 500 ml   Weight change: -0.2 kg  Physical Exam: Gen:NAD HEENT: periorbital  edema CVS: RRR Resp:nonlabored Abd: soft Ext: + anasarca  Imaging: No results found.  Labs: BMET Recent Labs  Lab 12/31/23 0624 01/01/24 0229 01/02/24 0120 01/03/24 0729 01/04/24 0702 01/05/24 0316 01/06/24 0313  NA 139 139 137 137 138 138 139  K 4.0 4.5 4.7 5.0 4.5 4.5 4.4  CL 113* 112* 111 112* 107 109 109  CO2 17* 20* 20* 19* 22 14* 21*  GLUCOSE 147* 226* 185* 235* 216* 274* 149*  BUN 28* 28* 32* 38* 37* 36* 36*  CREATININE 2.78* 3.10* 3.28* 3.53* 3.60* 3.69* 4.26*  CALCIUM  8.1* 7.7* 7.8* 8.1* 8.5* 8.2* 8.2*  PHOS  --   --  3.2 4.1 4.3 4.7*  --    CBC Recent Labs  Lab 01/03/24 0729 01/04/24 0702 01/05/24 0316 01/06/24 0313  WBC 8.5 9.2 6.8 8.7  NEUTROABS 6.1 6.4 4.8 6.1  HGB 7.9* 8.5* 8.7* 8.2*  HCT 24.4* 25.7* 27.1* 25.9*  MCV 84.7 84.0 85.2 84.9  PLT 120* 149* 152 174    Medications:     acetaminophen   325 mg Oral Once   amLODipine   10 mg Oral Daily   atorvastatin   40 mg Oral QHS   carvedilol   6.25 mg Oral BID WC   cyclobenzaprine   10 mg Oral BID   enoxaparin  (LOVENOX ) injection  30 mg Subcutaneous Q24H   fluticasone  furoate-vilanterol  1 puff Inhalation Daily   gabapentin   300 mg Oral BID   hydrALAZINE   100 mg Oral Q8H   insulin  aspart  0-5 Units Subcutaneous QHS   insulin  aspart  0-6 Units Subcutaneous TID WC   insulin  glargine  16 Units Subcutaneous Daily   iron  polysaccharides  150 mg Oral Daily   LORazepam   0.5 mg Intravenous Once   melatonin  3 mg Oral QHS   montelukast   10 mg Oral QHS   pantoprazole   40 mg Oral Daily   sodium bicarbonate   650 mg Oral BID   torsemide   40 mg Oral Daily    Almarie Bonine, MD 01/06/2024, 2:51 PM

## 2024-01-06 NOTE — Plan of Care (Signed)

## 2024-01-06 NOTE — Progress Notes (Signed)
 Patient is currently refusing to have blood pressure taken to see if 1400 dose of hydralazine  can be administered- patient stated will use call bell to notify once done with nap- patient stated is agreeable after nap

## 2024-01-06 NOTE — Progress Notes (Signed)
 PROGRESS NOTE     Patient Demographics:    Sara [name], is a 48 y.o. female, DOB - 11-Aug-1975, FMW:[id number]  Outpatient Primary MD for the patient is Lenon Homer, MD    LOS - 6  Admit date - 12/30/2023    Chief Complaint  Patient presents with   Psychiatric Evaluation        Hyperglycemia   Hypertension    Pt non compliant with meds and having SI behavior issues at a group home.        Brief Narrative (HPI from H&P)    48 y.o. African-American female with medical history significant for GERD, depression, type 2 diabetes mellitus, H. pylori infection, hypertension, dyslipidemia and OSA, who presented to the emergency room with acute onset of psychiatric evaluation for suicidal ideation and hyperglycemia.  The patient admitted to polydipsia without significant polyuria.  No chest pain or palpitations.  No cough or wheezing or dyspnea.  No dysuria, oliguria or hematuria or flank pain.  In the ER was diagnosed with nonketotic hyperosmolar state from not taking her insulin  for a while, depression, suicidal ideation and she was admitted to the hospital.   Subjective:   Patient in bed, appears comfortable, complaining of generalized body ache, otherwise no significant events overnight    Assessment  & Plan :   Type 2 diabetes mellitus with hyperosmolar nonketotic hyperglycemia (HCC)   - Due to noncompliance with insulin , was depressed and not taking it for several days, initially on IV insulin  drip now transition to subcu, counseled on compliance, dose adjusted further on 01/04/2024, continue to monitor. - Intermittently refusing insulin , at risk Hypoglycemia if rapidly uptitrating her insulin   good blood sugar  control, so for now we will allow for some hyperglycemia.   Lab Results  Component Value Date   HGBA1C 8.6 (H) 10/06/2023   CBG (last 3)  Recent Labs    01/05/24 1710 01/05/24 2109 01/06/24 0748  GLUCAP 141* 243* 135*      Acute kidney injury superimposed on CKD stage IIIb  Nephrotic range proteinuria  Microscopic hematuria - Management per renal,  creatinine is up this morning. - Hold ACE inhibitor -On torsemide . - Renal biopsy after aspirin  washout, Monday is the earliest.  GERD without esophagitis  - PPI  Dyslipidemia  - Will continue statin therapy.   Anemia of chronic kidney disease. Iron  deficiency anemia -  received 1 unit of packed RBC on 01/02/2024. - Continue with oral iron   Essential hypertension  -Blood pressure medication regimen adjusted on 01/03/2024 further for better control.  Depression with suicidal ideation.  Sitter and psych eval. well require [facility] per psych when medically cleared        Condition - Fair  Family Communication  : None present  Code Status :  Full  Consults  :  Psych, Renal  PUD Prophylaxis :  PPI   Procedures  :     Renal US  -  medical renal disease      Disposition Plan  : Inpatient psych, but patient is not medically cleared yet as will need biopsy, as well as creatinine keep trending up  Status is: Inpatient  DVT Prophylaxis  :    enoxaparin  (LOVENOX ) injection 30 mg Start: 01/01/24 1000   Lab Results  Component Value Date   PLT 174 01/06/2024    Diet :  Diet Order             Diet heart healthy/carb modified Room service appropriate? Yes; Fluid consistency: Thin  Diet effective now                    Inpatient Medications  Scheduled Meds:  acetaminophen   325 mg Oral Once   amLODipine   10 mg Oral Daily   atorvastatin   40 mg Oral QHS   carvedilol   6.25 mg Oral BID WC   cyclobenzaprine   10 mg Oral BID   enoxaparin  (LOVENOX ) injection  30 mg Subcutaneous Q24H   fluticasone  furoate-vilanterol   1 puff Inhalation Daily   gabapentin   300 mg Oral BID   hydrALAZINE   100 mg Oral Q8H   insulin  aspart  0-5 Units Subcutaneous QHS   insulin  aspart  0-6 Units Subcutaneous TID WC   insulin  glargine  16 Units Subcutaneous Daily   iron  polysaccharides  150 mg Oral Daily   LORazepam   0.5 mg Intravenous Once   melatonin  3 mg Oral QHS   montelukast   10 mg Oral QHS   pantoprazole   40 mg Oral Daily   sodium bicarbonate   650 mg Oral BID   torsemide   40 mg Oral Daily   Continuous Infusions:   PRN Meds:.acetaminophen  **OR** acetaminophen , albuterol , cyclobenzaprine , dextrose , ondansetron  **OR** ondansetron  (ZOFRAN ) IV, senna-docusate, sodium chloride , traZODone   Antibiotics  :    Anti-infectives (From admission, onward)    None         Objective:   Vitals:   01/05/24 2104 01/06/24 0343 01/06/24 0753 01/06/24 0832  BP: (!) 154/83 114/71 (!) 163/87   Pulse:  (!) 104 (!) 112 (!) 114  Resp:  16 17 18   Temp:  98.7 F (37.1 C) 98.3 F (36.8 C)   TempSrc:      SpO2:  93% 100% 98%  Weight:  88.8 kg    Height:        Wt Readings from Last 3 Encounters:  01/06/24 88.8 kg  10/07/23 64.5 kg  04/08/23 74.8 kg     Intake/Output Summary (Last 24 hours) at 01/06/2024 0926 Last data filed at 01/06/2024 0913 Gross per 24 hour  Intake 500 ml  Output --  Net  500 ml     Physical Exam   Awake Alert, no apparent distress Symmetrical Chest wall movement, Good air movement bilaterally, CTAB RRR,No Gallops,Rubs or new Murmurs, No Parasternal Heave +ve B.Sounds, Abd Soft, No tenderness, No rebound - guarding or rigidity. No Cyanosis, +1 edema      Data Review:    Recent Labs  Lab 01/02/24 0258 01/03/24 0729 01/04/24 0702 01/05/24 0316 01/06/24 0313  WBC 9.9 8.5 9.2 6.8 8.7  HGB 7.7* 7.9* 8.5* 8.7* 8.2*  HCT 23.8* 24.4* 25.7* 27.1* 25.9*  PLT 151 120* 149* 152 174  MCV 83.8 84.7 84.0 85.2 84.9  MCH 27.1 27.4 27.8 27.4 26.9  MCHC 32.4 32.4 33.1 32.1 31.7  RDW 14.2  14.1 14.3 14.2 14.1  LYMPHSABS 2.5 1.6 1.8 1.3 1.5  MONOABS 0.7 0.6 0.7 0.6 0.9  EOSABS 0.1 0.1 0.1 0.1 0.1  BASOSABS 0.0 0.0 0.0 0.0 0.0    Recent Labs  Lab 12/31/23 0021 12/31/23 0103 01/02/24 0120 01/03/24 0729 01/04/24 0702 01/05/24 0316 01/06/24 0313  NA 137   < > 137 137 138 138 139  K 4.4   < > 4.7 5.0 4.5 4.5 4.4  CL 109   < > 111 112* 107 109 109  CO2 21*   < > 20* 19* 22 14* 21*  ANIONGAP 7   < > 6 6 9 15 9   GLUCOSE 534*   < > 185* 235* 216* 274* 149*  BUN 28*   < > 32* 38* 37* 36* 36*  CREATININE 3.04*   < > 3.28* 3.53* 3.60* 3.69* 4.26*  AST 22  --   --   --  14*  --   --   ALT 31  --   --   --  19  --   --   ALKPHOS 88  --   --   --  65  --   --   BILITOT 0.7  --   --   --  0.7  --   --   ALBUMIN  2.3*  --   --   --  2.1*  --   --   MG  --   --  1.6* 2.3 2.1 2.1  --   PHOS  --   --  3.2 4.1 4.3 4.7*  --   CALCIUM  7.9*   < > 7.8* 8.1* 8.5* 8.2* 8.2*   < > = values in this interval not displayed.      Recent Labs  Lab 01/02/24 0120 01/03/24 0729 01/04/24 0702 01/05/24 0316 01/06/24 0313  MG 1.6* 2.3 2.1 2.1  --   CALCIUM  7.8* 8.1* 8.5* 8.2* 8.2*    --------------------------------------------------------------------------------------------------------------- Lab Results  Component Value Date   CHOL 215 (H) 01/10/2023   HDL 51 01/10/2023   LDLCALC 148 (H) 01/10/2023   TRIG 79 01/10/2023   CHOLHDL 4.2 01/10/2023    Lab Results  Component Value Date   HGBA1C 8.6 (H) 10/06/2023   No results for input(s): TSH, T4TOTAL, FREET4, T3FREE, THYROIDAB in the last 72 hours. No results for input(s): VITAMINB12, FOLATE, FERRITIN, TIBC, IRON , RETICCTPCT in the last 72 hours.  ------------------------------------------------------------------------------------------------------------------ Cardiac Enzymes No results for input(s): CKMB, TROPONINI, MYOGLOBIN in the last 168 hours.  Invalid input(s): CK  Micro Results No  results found for this or any previous visit (from the past 240 hours).  Radiology Report No results found.    Signature  -   Brayton Lye M.D on 01/06/2024 at 9:26 AM   -  To page go to [email or web address]

## 2024-01-06 NOTE — Progress Notes (Signed)
 Patient is currently refusing to wear patient ID bracelet and/or purple DNR bracelet- provided education- notifying preceptor and charge rn

## 2024-01-06 NOTE — Progress Notes (Signed)
 Pt is complaining of right breast pain. Right breast is noticeably bigger and upon palpation the right breast is more firmer and swollen compared to the left, in addition to being tender to touch. Pt has expressed this concern to md

## 2024-01-06 NOTE — Plan of Care (Signed)
 Patient has been compliant with all interventions so far this shift.  Patient stated no suicidal ideations this shift.    Problem: Education: Goal: Ability to describe self-care measures that may prevent or decrease complications (Diabetes Survival Skills Education) will improve Outcome: Progressing Goal: Individualized Educational Video(s) Outcome: Progressing   Problem: Coping: Goal: Ability to adjust to condition or change in health will improve Outcome: Progressing   Problem: Fluid Volume: Goal: Ability to maintain a balanced intake and output will improve Outcome: Progressing   Problem: Health Behavior/Discharge Planning: Goal: Ability to identify and utilize available resources and services will improve Outcome: Progressing Goal: Ability to manage health-related needs will improve Outcome: Progressing   Problem: Metabolic: Goal: Ability to maintain appropriate glucose levels will improve Outcome: Progressing   Problem: Nutritional: Goal: Maintenance of adequate nutrition will improve Outcome: Progressing Goal: Progress toward achieving an optimal weight will improve Outcome: Progressing   Problem: Skin Integrity: Goal: Risk for impaired skin integrity will decrease Outcome: Progressing   Problem: Tissue Perfusion: Goal: Adequacy of tissue perfusion will improve Outcome: Progressing   Problem: Education: Goal: Ability to describe self-care measures that may prevent or decrease complications (Diabetes Survival Skills Education) will improve Outcome: Progressing Goal: Individualized Educational Video(s) Outcome: Progressing   Problem: Cardiac: Goal: Ability to maintain an adequate cardiac output will improve Outcome: Progressing   Problem: Health Behavior/Discharge Planning: Goal: Ability to identify and utilize available resources and services will improve Outcome: Progressing Goal: Ability to manage health-related needs will improve Outcome: Progressing    Problem: Fluid Volume: Goal: Ability to achieve a balanced intake and output will improve Outcome: Progressing   Problem: Metabolic: Goal: Ability to maintain appropriate glucose levels will improve Outcome: Progressing   Problem: Nutritional: Goal: Maintenance of adequate nutrition will improve Outcome: Progressing Goal: Maintenance of adequate weight for body size and type will improve Outcome: Progressing   Problem: Respiratory: Goal: Will regain and/or maintain adequate ventilation Outcome: Progressing   Problem: Urinary Elimination: Goal: Ability to achieve and maintain adequate renal perfusion and functioning will improve Outcome: Progressing   Problem: Education: Goal: Knowledge of General Education information will improve Description: Including pain rating scale, medication(s)/side effects and non-pharmacologic comfort measures Outcome: Progressing   Problem: Health Behavior/Discharge Planning: Goal: Ability to manage health-related needs will improve Outcome: Progressing   Problem: Clinical Measurements: Goal: Ability to maintain clinical measurements within normal limits will improve Outcome: Progressing Goal: Will remain free from infection Outcome: Progressing Goal: Diagnostic test results will improve Outcome: Progressing Goal: Respiratory complications will improve Outcome: Progressing Goal: Cardiovascular complication will be avoided Outcome: Progressing   Problem: Activity: Goal: Risk for activity intolerance will decrease Outcome: Progressing   Problem: Nutrition: Goal: Adequate nutrition will be maintained Outcome: Progressing   Problem: Coping: Goal: Level of anxiety will decrease Outcome: Progressing   Problem: Elimination: Goal: Will not experience complications related to bowel motility Outcome: Progressing Goal: Will not experience complications related to urinary retention Outcome: Progressing   Problem: Pain Managment: Goal:  General experience of comfort will improve and/or be controlled Outcome: Progressing   Problem: Safety: Goal: Ability to remain free from injury will improve Outcome: Progressing   Problem: Skin Integrity: Goal: Risk for impaired skin integrity will decrease Outcome: Progressing

## 2024-01-07 ENCOUNTER — Encounter (HOSPITAL_COMMUNITY): Payer: Self-pay | Admitting: Family Medicine

## 2024-01-07 LAB — RENAL FUNCTION PANEL
Albumin: 2.2 g/dL — ABNORMAL LOW (ref 3.5–5.0)
Anion gap: 9 (ref 5–15)
BUN: 39 mg/dL — ABNORMAL HIGH (ref 6–20)
CO2: 21 mmol/L — ABNORMAL LOW (ref 22–32)
Calcium: 8.1 mg/dL — ABNORMAL LOW (ref 8.9–10.3)
Chloride: 106 mmol/L (ref 98–111)
Creatinine, Ser: 4.04 mg/dL — ABNORMAL HIGH (ref 0.44–1.00)
GFR, Estimated: 13 mL/min — ABNORMAL LOW (ref >60)
Glucose, Bld: 150 mg/dL — ABNORMAL HIGH (ref 70–99)
Phosphorus: 4.9 mg/dL — ABNORMAL HIGH (ref 2.5–4.6)
Potassium: 5.2 mmol/L — ABNORMAL HIGH (ref 3.5–5.1)
Sodium: 136 mmol/L (ref 135–145)

## 2024-01-07 LAB — GLUCOSE, CAPILLARY
Glucose-Capillary: 148 mg/dL — ABNORMAL HIGH (ref 70–99)
Glucose-Capillary: 218 mg/dL — ABNORMAL HIGH (ref 70–99)
Glucose-Capillary: 126 mg/dL — ABNORMAL HIGH (ref 70–99)
Glucose-Capillary: 185 mg/dL — ABNORMAL HIGH (ref 70–99)

## 2024-01-07 LAB — MISC LABCORP TEST (SEND OUT): Labcorp test code: 354928

## 2024-01-07 MED ORDER — SODIUM ZIRCONIUM CYCLOSILICATE 10 G PO PACK
10.0000 g | PACK | Freq: Once | ORAL | Status: AC
  Administered 2024-01-07: 10 g via ORAL
  Filled 2024-01-07: qty 1

## 2024-01-07 MED ORDER — HEPARIN SODIUM (PORCINE) 5000 UNIT/ML IJ SOLN
5000.0000 [IU] | Freq: Three times a day (TID) | INTRAMUSCULAR | Status: DC
  Administered 2024-01-07 – 2024-01-10 (×4): 5000 [IU] via SUBCUTANEOUS
  Filled 2024-01-07 (×7): qty 1

## 2024-01-07 MED ORDER — GABAPENTIN 100 MG PO CAPS
100.0000 mg | ORAL_CAPSULE | Freq: Two times a day (BID) | ORAL | Status: AC
  Administered 2024-01-07 – 2024-01-22 (×29): 100 mg via ORAL
  Filled 2024-01-07 (×29): qty 1

## 2024-01-07 MED ORDER — INSULIN GLARGINE 100 UNIT/ML ~~LOC~~ SOLN
18.0000 [IU] | Freq: Every day | SUBCUTANEOUS | Status: DC
  Administered 2024-01-07 – 2024-01-12 (×5): 18 [IU] via SUBCUTANEOUS
  Filled 2024-01-07 (×7): qty 0.18

## 2024-01-07 MED ORDER — ALBUMIN HUMAN 25 % IV SOLN
25.0000 g | Freq: Four times a day (QID) | INTRAVENOUS | Status: AC
  Administered 2024-01-07 – 2024-01-08 (×2): 25 g via INTRAVENOUS
  Filled 2024-01-07: qty 100

## 2024-01-07 NOTE — Plan of Care (Signed)
 Patient has been agreeable with most interventions this shift      Problem: Education: Goal: Ability to describe self-care measures that may prevent or decrease complications (Diabetes Survival Skills Education) will improve Outcome: Progressing Goal: Individualized Educational Video(s) Outcome: Progressing   Problem: Coping: Goal: Ability to adjust to condition or change in health will improve Outcome: Progressing   Problem: Fluid Volume: Goal: Ability to maintain a balanced intake and output will improve Outcome: Progressing   Problem: Health Behavior/Discharge Planning: Goal: Ability to identify and utilize available resources and services will improve Outcome: Progressing Goal: Ability to manage health-related needs will improve Outcome: Progressing   Problem: Metabolic: Goal: Ability to maintain appropriate glucose levels will improve Outcome: Progressing   Problem: Nutritional: Goal: Maintenance of adequate nutrition will improve Outcome: Progressing Goal: Progress toward achieving an optimal weight will improve Outcome: Progressing   Problem: Skin Integrity: Goal: Risk for impaired skin integrity will decrease Outcome: Progressing   Problem: Tissue Perfusion: Goal: Adequacy of tissue perfusion will improve Outcome: Progressing   Problem: Education: Goal: Ability to describe self-care measures that may prevent or decrease complications (Diabetes Survival Skills Education) will improve Outcome: Progressing Goal: Individualized Educational Video(s) Outcome: Progressing   Problem: Cardiac: Goal: Ability to maintain an adequate cardiac output will improve Outcome: Progressing   Problem: Health Behavior/Discharge Planning: Goal: Ability to identify and utilize available resources and services will improve Outcome: Progressing Goal: Ability to manage health-related needs will improve Outcome: Progressing   Problem: Fluid Volume: Goal: Ability to achieve a  balanced intake and output will improve Outcome: Progressing   Problem: Metabolic: Goal: Ability to maintain appropriate glucose levels will improve Outcome: Progressing   Problem: Nutritional: Goal: Maintenance of adequate nutrition will improve Outcome: Progressing Goal: Maintenance of adequate weight for body size and type will improve Outcome: Progressing   Problem: Respiratory: Goal: Will regain and/or maintain adequate ventilation Outcome: Progressing   Problem: Urinary Elimination: Goal: Ability to achieve and maintain adequate renal perfusion and functioning will improve Outcome: Progressing   Problem: Education: Goal: Knowledge of General Education information will improve Description: Including pain rating scale, medication(s)/side effects and non-pharmacologic comfort measures Outcome: Progressing   Problem: Health Behavior/Discharge Planning: Goal: Ability to manage health-related needs will improve Outcome: Progressing   Problem: Clinical Measurements: Goal: Ability to maintain clinical measurements within normal limits will improve Outcome: Progressing Goal: Will remain free from infection Outcome: Progressing Goal: Diagnostic test results will improve Outcome: Progressing Goal: Respiratory complications will improve Outcome: Progressing Goal: Cardiovascular complication will be avoided Outcome: Progressing   Problem: Activity: Goal: Risk for activity intolerance will decrease Outcome: Progressing   Problem: Nutrition: Goal: Adequate nutrition will be maintained Outcome: Progressing   Problem: Coping: Goal: Level of anxiety will decrease Outcome: Progressing   Problem: Elimination: Goal: Will not experience complications related to bowel motility Outcome: Progressing Goal: Will not experience complications related to urinary retention Outcome: Progressing   Problem: Pain Managment: Goal: General experience of comfort will improve and/or be  controlled Outcome: Progressing   Problem: Safety: Goal: Ability to remain free from injury will improve Outcome: Progressing   Problem: Skin Integrity: Goal: Risk for impaired skin integrity will decrease Outcome: Progressing

## 2024-01-07 NOTE — Progress Notes (Signed)
 PT Cancellation Note  Patient Details Name: Sara [name] MRN: [id number] DOB: 09-18-1975   Cancelled Treatment:     Pt evaluated on 12/5 and was discharged from PT services, pt mobilizing at independent baseline level. New orders today. Per discussion with RN and MD, pt mobility continues to be steady with no new mobility concerns. Will discontinue orders at this time. Re-consult as needed if mobility needs change.   Sabra Morel, PT, DPT  Acute Rehabilitation Services         Office: [phone]      Sabra MARLA Morel 01/07/2024, 10:05 AM

## 2024-01-07 NOTE — Progress Notes (Signed)
 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  Also note nephrotic range proteinuria, microscopic hematuria, and anasarca - concerning for possible GN - Continue supportive care  - hold lisinopril  - IV albumin  ordered   # Nephrotic range proteinuria  - UP/cr ratio is 6220 mg/g - she is most likely nephrotic range proteinuria and CKD from her uncontrolled diabetes and thus decreased renal reserve  - serologic work-up as below - SPEP and free light chains pending and UPEP is pending (listed currently under misc [facility] test (sendout)  - Add PLA2R antibody (under misc [facility] test - sendout) Agreeable to biopsy Have ordered    # Fluid volume overload - Strict ins/outs and daily weights    # Microscopic Hematuria - repeat UA with persistent RBC - ANA, complement, ANCA pending.  HIV non-reactive and hepatitis panel non-reactive   # Metabolic acidosis - secondary to AKI and nonketotic hyperosmolar state from insulin  noncompliance   # HTN  - slightly higher today   # Normocytic anemia  - may have had component from CKD - iron  deficiency - continue oral iron     Subjective:    Seen in room- now willing to take albumin , had a discussion with her RN yesterday and confirms with me today.   Objective:   BP 132/76 (BP Location: Right Arm)   Pulse (!) 118 Comment: Patient's HR baseline is above 100- on medication  Temp 98.7 F (37.1 C)   Resp 18   Ht 5' 8 (1.727 m)   Wt 87.9 kg   SpO2 100%   BMI 29.46 kg/m   Intake/Output Summary (Last 24 hours) at 01/07/2024 1109 Last data filed at 01/07/2024 1000 Gross per 24 hour  Intake 980 ml  Output --  Net 980 ml   Weight change: -0.9 kg  Physical Exam: Gen:NAD HEENT: periorbital edema CVS: RRR Resp:nonlabored Abd: soft Ext: + anasarca  Imaging: No results  found.  Labs: BMET Recent Labs  Lab 01/01/24 0229 01/02/24 0120 01/03/24 0729 01/04/24 0702 01/05/24 0316 01/06/24 0313 01/07/24 0738  NA 139 137 137 138 138 139 136  K 4.5 4.7 5.0 4.5 4.5 4.4 5.2*  CL 112* 111 112* 107 109 109 106  CO2 20* 20* 19* 22 14* 21* 21*  GLUCOSE 226* 185* 235* 216* 274* 149* 150*  BUN 28* 32* 38* 37* 36* 36* 39*  CREATININE 3.10* 3.28* 3.53* 3.60* 3.69* 4.26* 4.04*  CALCIUM  7.7* 7.8* 8.1* 8.5* 8.2* 8.2* 8.1*  PHOS  --  3.2 4.1 4.3 4.7*  --  4.9*   CBC Recent Labs  Lab 01/03/24 0729 01/04/24 0702 01/05/24 0316 01/06/24 0313  WBC 8.5 9.2 6.8 8.7  NEUTROABS 6.1 6.4 4.8 6.1  HGB 7.9* 8.5* 8.7* 8.2*  HCT 24.4* 25.7* 27.1* 25.9*  MCV 84.7 84.0 85.2 84.9  PLT 120* 149* 152 174    Medications:     acetaminophen   325 mg Oral Once   amLODipine   10 mg Oral Daily   atorvastatin   40 mg Oral QHS   carvedilol   6.25 mg Oral BID WC   cyclobenzaprine   10 mg Oral BID   fluticasone  furoate-vilanterol  1 puff Inhalation Daily   gabapentin   100 mg Oral BID   heparin  injection (subcutaneous)  5,000 Units Subcutaneous Q8H   hydrALAZINE   100 mg Oral Q8H   insulin  aspart  0-5 Units Subcutaneous QHS  insulin  aspart  0-6 Units Subcutaneous TID WC   insulin  glargine  18 Units Subcutaneous Daily   iron  polysaccharides  150 mg Oral Daily   melatonin  3 mg Oral QHS   montelukast   10 mg Oral QHS   pantoprazole   40 mg Oral Daily   sodium bicarbonate   650 mg Oral BID   torsemide   40 mg Oral Daily    Almarie Bonine, MD 01/07/2024, 11:09 AM

## 2024-01-07 NOTE — Progress Notes (Signed)
°   01/07/24 0951  Assess: MEWS Score  Temp 98.7 F (37.1 C)  BP 132/76  Pulse Rate (!) 118 (Patient's HR baseline is above 100- on medication)  Resp 18  Level of Consciousness Alert  SpO2 100 %  O2 Device Room Air  Assess: MEWS Score  MEWS Temp 0  MEWS Systolic 0  MEWS Pulse 2  MEWS RR 0  MEWS LOC 0  MEWS Score 2  MEWS Score Color Yellow  Assess: if the MEWS score is Yellow or Red  Were vital signs accurate and taken at a resting state? Yes  Does the patient meet 2 or more of the SIRS criteria? No  MEWS guidelines implemented  No, previously yellow, continue vital signs every 4 hours  Assess: SIRS CRITERIA  SIRS Temperature  0  SIRS Respirations  0  SIRS Pulse 1  SIRS WBC 0  SIRS Score Sum  1

## 2024-01-07 NOTE — Progress Notes (Addendum)
 PROGRESS NOTE     Patient Demographics:    Sara [name], is a 48 y.o. female, DOB - 18-May-1975, FMW:[id number]  Outpatient Primary MD for the patient is Lenon Homer, MD    LOS - 7  Admit date - 12/30/2023    Chief Complaint  Patient presents with   Psychiatric Evaluation        Hyperglycemia   Hypertension    Pt non compliant with meds and having SI behavior issues at a group home.        Brief Narrative (HPI from H&P)    48 y.o. African-American female with medical history significant for GERD, depression, type 2 diabetes mellitus, H. pylori infection, hypertension, dyslipidemia and OSA, who presented to the emergency room with acute onset of psychiatric evaluation for suicidal ideation and hyperglycemia.  The patient admitted to polydipsia without significant polyuria.  No chest pain or palpitations.  No cough or wheezing or dyspnea.  No dysuria, oliguria or hematuria or flank pain.  In the ER was diagnosed with nonketotic hyperosmolar state from not taking her insulin  for a while, depression, suicidal ideation and she was admitted to the hospital.   Subjective:   Patient in bed, reports she is feeling better today, had good BM yesterday.     Assessment  & Plan :   Type 2 diabetes mellitus with hyperosmolar nonketotic hyperglycemia (HCC)   - Due to noncompliance with insulin , was depressed and not taking it for several days, initially on IV insulin  drip now transition to subcu, counseled on compliance, dose adjusted further on 01/04/2024, continue to monitor. - Intermittently refusing insulin , at risk Hypoglycemia if rapidly uptitrating her insulin   good blood sugar control, so for now we will allow for some  hyperglycemia.  She has been compliant with her insulin  over the last 48 hours, so will increase Lantus  to 18 units. Lab Results  Component Value Date   HGBA1C 8.6 (H) 10/06/2023   CBG (last 3)  Recent Labs    01/06/24 2040 01/06/24 2249 01/07/24 0812  GLUCAP 251* 227* 148*      Acute kidney  injury superimposed on CKD stage IIIb  Nephrotic range proteinuria  Microscopic hematuria - Management per renal, plan for renal biopsy on Monday after aspirin  washout -Continue with bicarb - Continue to hold lisinopril  - Monitor as on torsemide  - Renal biopsy after aspirin  washout, Monday is the earliest. - Well decrease gabapentin  from 300 twice daily to 100 mg 3 times daily  GERD without esophagitis  - PPI  Hyperkalemia -Potassium 5.2 today, will give Lokelma   Dyslipidemia  - Will continue statin therapy.   Anemia of chronic kidney disease. Iron  deficiency anemia -  received 1 unit of packed RBC on 01/02/2024. - Continue with oral iron   Essential hypertension  -Blood pressure controlled on current regimen avoid hypotension in the setting of worsening renal function  Depression with suicidal ideation.  Sitter and psych eval. well require [facility] per psych when medically cleared        Condition - Fair  Family Communication  : None present at bedside, discussed with son by phone.  Code Status :  Full  Consults  :  Psych, Renal, IR  PUD Prophylaxis :  PPI   Procedures  :     Renal US  -  medical renal disease      Disposition Plan  : Inpatient psych, but patient is not medically cleared yet as will need biopsy, as well as creatinine keep trending up  Status is: Inpatient  DVT Prophylaxis  :    enoxaparin  (LOVENOX ) injection 30 mg Start: 01/01/24 1000   Lab Results  Component Value Date   PLT 174 01/06/2024    Diet :  Diet Order             Diet heart healthy/carb modified Room service appropriate? Yes; Fluid consistency: Thin  Diet effective now                     Inpatient Medications  Scheduled Meds:  acetaminophen   325 mg Oral Once   amLODipine   10 mg Oral Daily   atorvastatin   40 mg Oral QHS   carvedilol   6.25 mg Oral BID WC   cyclobenzaprine   10 mg Oral BID   enoxaparin  (LOVENOX ) injection  30 mg Subcutaneous Q24H   fluticasone  furoate-vilanterol  1 puff Inhalation Daily   gabapentin   300 mg Oral BID   hydrALAZINE   100 mg Oral Q8H   insulin  aspart  0-5 Units Subcutaneous QHS   insulin  aspart  0-6 Units Subcutaneous TID WC   insulin  glargine  18 Units Subcutaneous Daily   iron  polysaccharides  150 mg Oral Daily   melatonin  3 mg Oral QHS   montelukast   10 mg Oral QHS   pantoprazole   40 mg Oral Daily   sodium bicarbonate   650 mg Oral BID   torsemide   40 mg Oral Daily   Continuous Infusions:   PRN Meds:.acetaminophen  **OR** acetaminophen , albuterol , cyclobenzaprine , dextrose , ondansetron  **OR** ondansetron  (ZOFRAN ) IV, senna-docusate, sodium chloride , traZODone   Antibiotics  :    Anti-infectives (From admission, onward)    None         Objective:   Vitals:   01/06/24 2237 01/07/24 0610 01/07/24 0730 01/07/24 0830  BP: 120/71 (!) 144/77  137/76  Pulse: (!) 113 (!) 106 (!) 116 (!) 120  Resp: 17 19 18 20   Temp: 99.7 F (37.6 C) 99 F (37.2 C)  98.5 F (36.9 C)  TempSrc:    Oral  SpO2: 98% 100% 99% 99%  Weight:      Height:  Wt Readings from Last 3 Encounters:  01/06/24 88.8 kg  10/07/23 64.5 kg  04/08/23 74.8 kg     Intake/Output Summary (Last 24 hours) at 01/07/2024 0945 Last data filed at 01/07/2024 0911 Gross per 24 hour  Intake 780 ml  Output --  Net 780 ml     Physical Exam  Awake Alert, Oriented X 3 CTAB RRR +ve B.Sounds, Abd Soft No Cyanosis, Clubbing, trace  edema, No new Rash or bruise        Data Review:    Recent Labs  Lab 01/02/24 0258 01/03/24 0729 01/04/24 0702 01/05/24 0316 01/06/24 0313  WBC 9.9 8.5 9.2 6.8 8.7  HGB 7.7* 7.9* 8.5* 8.7* 8.2*  HCT  23.8* 24.4* 25.7* 27.1* 25.9*  PLT 151 120* 149* 152 174  MCV 83.8 84.7 84.0 85.2 84.9  MCH 27.1 27.4 27.8 27.4 26.9  MCHC 32.4 32.4 33.1 32.1 31.7  RDW 14.2 14.1 14.3 14.2 14.1  LYMPHSABS 2.5 1.6 1.8 1.3 1.5  MONOABS 0.7 0.6 0.7 0.6 0.9  EOSABS 0.1 0.1 0.1 0.1 0.1  BASOSABS 0.0 0.0 0.0 0.0 0.0    Recent Labs  Lab 01/02/24 0120 01/03/24 0729 01/04/24 0702 01/05/24 0316 01/06/24 0313 01/07/24 0738  NA 137 137 138 138 139 136  K 4.7 5.0 4.5 4.5 4.4 5.2*  CL 111 112* 107 109 109 106  CO2 20* 19* 22 14* 21* 21*  ANIONGAP 6 6 9 15 9 9   GLUCOSE 185* 235* 216* 274* 149* 150*  BUN 32* 38* 37* 36* 36* 39*  CREATININE 3.28* 3.53* 3.60* 3.69* 4.26* 4.04*  AST  --   --  14*  --   --   --   ALT  --   --  19  --   --   --   ALKPHOS  --   --  65  --   --   --   BILITOT  --   --  0.7  --   --   --   ALBUMIN   --   --  2.1*  --   --  2.2*  MG 1.6* 2.3 2.1 2.1  --   --   PHOS 3.2 4.1 4.3 4.7*  --  4.9*  CALCIUM  7.8* 8.1* 8.5* 8.2* 8.2* 8.1*      Recent Labs  Lab 01/02/24 0120 01/03/24 0729 01/04/24 0702 01/05/24 0316 01/06/24 0313 01/07/24 0738  MG 1.6* 2.3 2.1 2.1  --   --   CALCIUM  7.8* 8.1* 8.5* 8.2* 8.2* 8.1*    --------------------------------------------------------------------------------------------------------------- Lab Results  Component Value Date   CHOL 215 (H) 01/10/2023   HDL 51 01/10/2023   LDLCALC 148 (H) 01/10/2023   TRIG 79 01/10/2023   CHOLHDL 4.2 01/10/2023    Lab Results  Component Value Date   HGBA1C 8.6 (H) 10/06/2023   No results for input(s): TSH, T4TOTAL, FREET4, T3FREE, THYROIDAB in the last 72 hours. No results for input(s): VITAMINB12, FOLATE, FERRITIN, TIBC, IRON , RETICCTPCT in the last 72 hours.  ------------------------------------------------------------------------------------------------------------------ Cardiac Enzymes No results for input(s): CKMB, TROPONINI, MYOGLOBIN in the last 168  hours.  Invalid input(s): CK  Micro Results No results found for this or any previous visit (from the past 240 hours).  Radiology Report No results found.    Signature  -   Brayton Lye M.D on 01/07/2024 at 9:45 AM   -  To page go to [email or web address]

## 2024-01-07 NOTE — Consult Note (Addendum)
 Chief Complaint: Patient was seen in consultation today for proteinuria  Chief Complaint  Patient presents with   Psychiatric Evaluation        Hyperglycemia   Hypertension    Pt non compliant with meds and having SI behavior issues at a group home.    at the request of Gearline Norris   Referring Physician(s):  Gearline Norris   Supervising Physician: Hughes Simmonds  Patient Status: [facility] - In-pt  History of Present Illness: Sara [name] is a 48 y.o. female with PMHs of GERD, depression, type 2 diabetes mellitus, H. pylori infection, hypertension, dyslipidemia and OSA, AKI on CKD III and proteinuria, IR was consulted for random renal biopsy.   Patient presented to ED on 12/3 due to with acute onset of psychiatric evaluation for suicidal ideation and hyperglycemia, she was admitted on 12/3 due to polydipsia without significant polyuria. Nephrology has been following the patient, labs showed positive ANA, elevated kappa and lambda free light chains, and nephrotic range proteinuria. UC renal on 12/5 showed diffusely increased cortical echogenicity with preserved corticomedullary differentiation which can be seen with medical renal disease. IR was requested for random renal biopsy, planning to proceed on Monday 01/11/24 after aspirin  washout.   Patient seen at bedside. Sitter at bedside.  Patient sitting in a recliner, not in acute distress, eating breakfast. Son on the phone.  Reports swelling in her right breast and left thigh.  Patient currently has DNR order in place. Discussion with the patient and family regarding wishes. Patient discussed her DNR status with her son, she stated that she would like to revoke the DNR order and change her code status to full code. TRH notified.   The DNR order is rescinded during the procedure and the patient consents to the use of any resuscitation procedure needed to treat the clinical events that occur.    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 pylori infection    High risk medication use    Hypercholesterolemia    Hyperlipidemia, mixed    Hypertension    Other mixed anxiety disorders    Pollen allergies    Sickle cell trait    Sleep apnea    Vitamin D deficiency     Past Surgical History:  Procedure Laterality Date   ABDOMINAL HYSTERECTOMY     APPENDECTOMY     COLONOSCOPY  08-16-2008   Dr. Princella   ESOPHAGOGASTRODUODENOSCOPY  08-16-2008   Dr. Anette     Allergies: Amoxicillin, Biaxin  [clarithromycin ], Penicillins, Tetracyclines & related, Vibramycin [doxycycline], Flagyl  [metronidazole ], and Red dye #40 (allura red)  Medications: Prior to Admission medications  Medication Sig Start Date End Date Taking? Authorizing Provider  acetaminophen  (TYLENOL ) 650 MG CR tablet Take 650-1,300 mg by mouth daily as needed for pain.   Yes [provider]  ADVAIR DISKUS 250-50 MCG/ACT AEPB Inhale 1 puff into the lungs in the morning and at bedtime. 11/28/23  Yes [provider]  albuterol  (VENTOLIN  HFA) 108 (90 Base) MCG/ACT inhaler Inhale 2 puffs into the lungs every 6 (six) hours as needed for wheezing or shortness of breath. 10/09/23  Yes Emokpae, Courage, MD  amLODipine  (NORVASC ) 10 MG tablet Take 1 tablet (10 mg total) by mouth daily. 10/10/23  Yes Emokpae,  Courage, MD  atorvastatin  (LIPITOR) 40 MG tablet Take 1 tablet (40 mg total) by mouth daily. Patient taking differently: Take 40 mg by mouth at bedtime. 01/12/23  Yes Shafer, Jorene, NP  bisacodyl 5 MG EC tablet Take 5 mg by mouth daily as needed for mild constipation.   Yes [provider]  gabapentin  (NEURONTIN ) 300 MG capsule Take 2 capsules (600 mg total) by mouth 3 (three) times daily. Patient taking differently: Take 600 mg by mouth 2 (two) times daily  as needed (for diabetic neuropathy). 10/21/22 12/31/23 Yes Ntuen, Ellouise BROCKS, FNP  ipratropium-albuterol  (DUONEB) 0.5-2.5 (3) MG/3ML SOLN Take 3 mLs by nebulization every 6 (six) hours as needed. 11/27/23  Yes [provider]  metoprolol  succinate (TOPROL -XL) 25 MG 24 hr tablet Take 0.5 tablets (12.5 mg total) by mouth daily. 10/09/23  Yes Emokpae, Courage, MD  montelukast  (SINGULAIR ) 10 MG tablet Take 1 tablet (10 mg total) by mouth at bedtime. 10/09/23  Yes Emokpae, Courage, MD  omeprazole  (PRILOSEC) 40 MG capsule Take 1 capsule (40 mg total) by mouth daily. 10/09/23  Yes Emokpae, Courage, MD  ondansetron  (ZOFRAN -ODT) 4 MG disintegrating tablet Take 4 mg by mouth every 6 (six) hours as needed. 12/06/23  Yes [provider]  aspirin  EC 81 MG tablet Take 1 tablet (81 mg total) by mouth daily with breakfast. Swallow whole. Patient not taking: Reported on 12/31/2023 10/09/23   Pearlean Manus, MD  azithromycin  (ZITHROMAX ) 500 MG tablet Take 500 mg by mouth daily. Patient not taking: Reported on 12/31/2023 12/21/23   [provider]  cyclobenzaprine  (FLEXERIL ) 10 MG tablet Take 10 mg by mouth 2 (two) times daily. Patient not taking: Reported on 12/31/2023 08/12/23   [provider]  fluticasone  furoate-vilanterol (BREO ELLIPTA ) 100-25 MCG/ACT AEPB Inhale 1 puff into the lungs daily. Patient not taking: Reported on 12/31/2023 10/10/23   Pearlean Manus, MD  insulin  aspart (NOVOLOG ) 100 UNIT/ML injection Inject 0-15 Units into the skin 3 (three) times daily with meals. Patient not taking: Reported on 12/31/2023 10/21/22   Raye Ellouise BROCKS, FNP  LANTUS  SOLOSTAR 100 UNIT/ML Solostar Pen Inject 15 Units into the skin at bedtime. Patient not taking: Reported on 12/31/2023 11/30/23   [provider]  lisinopril  (ZESTRIL ) 20 MG tablet Take 1 tablet (20 mg total) by mouth daily. Patient not taking: Reported on 12/31/2023 10/09/23   Pearlean Manus, MD  senna-docusate (COLACE 2-IN-1)  8.6-50 MG tablet Take 1 tablet by mouth daily as needed (constipation). Patient not taking: Reported on 12/31/2023    [provider]     Family History  Problem Relation Age of Onset   Clotting disorder Mother    Crohn's disease Mother    Heart disease Mother    Colon cancer Father    Clotting disorder Brother    Diabetes Brother    Diabetes Maternal Aunt    Liver cancer Maternal Uncle    Prostate cancer Maternal Uncle    Colon cancer Maternal Uncle    Diabetes Paternal Aunt    Esophageal cancer Paternal Uncle     Social History   Socioeconomic History   Marital status: Divorced    Spouse name: Not on file   Number of children: 2   Years of education: Not on file   Highest education level: Not on file  Occupational History   Occupation: n/a  Tobacco Use   Smoking status: Every Day    Current packs/day: 1.00    Types: Cigarettes   Smokeless tobacco:  Never   Tobacco comments:    Will order nicotine  gum  Vaping Use   Vaping status: Never Used  Substance and Sexual Activity   Alcohol use: Not Currently   Drug use: Not Currently    Types: Marijuana, Cocaine    Comment: 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   Sexual activity: Not Currently  Other Topics Concern   Not on file  Social History Narrative   Caffiene 1 cup daily   Living shelter in [place], [place]   Social Drivers of Health   Tobacco Use: High Risk (01/07/2024)   Patient History    Smoking Tobacco Use: Every Day    Smokeless Tobacco Use: Never    Passive Exposure: Not on file  Financial Resource Strain: At Risk (03/16/2023)   Received from [employer]    Hard to pay for: Food: 2  Food Insecurity: Food Insecurity Present (12/31/2023)   Epic    Worried About Programme Researcher, Broadcasting/film/video in the Last Year: Often true    Ran Out of Food in the Last Year: Often true  Transportation Needs: Unmet Transportation Needs (12/31/2023)   Epic    Lack of Transportation (Medical): Yes     Lack of Transportation (Non-Medical): Yes  Physical Activity: Not at Risk (03/16/2023)   Received from [facility]   Physical Activity    Weekly Physical Activity: 1  Stress: Not at Risk (03/16/2023)   Received from [facility]   Stress    Do you feel these kinds of stress these days?: 1  Social Connections: At Risk (03/16/2023)   Received from [facility]   Social Connections    How often do you feel lonely or isolated from those around you? : 2  Depression (PHQ2-9): Not on file  Alcohol Screen: Low Risk (10/14/2022)   Alcohol Screen    Last Alcohol Screening Score (AUDIT): 3  Housing: High Risk (12/31/2023)   Epic    Unable to Pay for Housing in the Last Year: No    Number of Times Moved in the Last Year: 5    Homeless in the Last Year: Yes  Utilities: Not At Risk (12/31/2023)   Epic    Threatened with loss of utilities: No  Health Literacy: Not on file     Review of Systems: A 12 point ROS discussed and pertinent positives are indicated in the HPI above.  All other systems are negative.  Vital Signs: BP 137/76 (BP Location: Right Arm)   Pulse (!) 120 Comment: after walking from bathroom  Temp 98.5 F (36.9 C) (Oral)   Resp 20   Ht 5' 8 (1.727 m)   Wt 195 lb 12.3 oz (88.8 kg)   SpO2 99%   BMI 29.77 kg/m    Physical Exam Vitals reviewed.  Constitutional:      General: She is not in acute distress.    Appearance: She is not ill-appearing.  HENT:     Head: Normocephalic and atraumatic.     Mouth/Throat:     Mouth: Mucous membranes are moist.     Pharynx: Oropharynx is clear.  Cardiovascular:     Rate and Rhythm: Regular rhythm. Tachycardia present.  Pulmonary:     Effort: Pulmonary effort is normal.     Breath sounds: Normal breath sounds.  Chest:     Comments: Right breast appears to be bigger than left  Abdominal:     General: Abdomen is flat. Bowel sounds are normal.  Palpations: Abdomen is soft.  Musculoskeletal:     Cervical back: Neck supple.     Comments:  Patient reports left thigh swelling, was not able to exam appropriately due to patient sitting on a recliner.   Skin:    General: Skin is warm and dry.     Coloration: Skin is not jaundiced or pale.  Neurological:     Mental Status: She is alert and oriented to person, place, and time.  Psychiatric:        Mood and Affect: Mood normal.        Behavior: Behavior normal.        Judgment: Judgment normal.     MD Evaluation Airway: WNL Heart: WNL Abdomen: WNL Chest/ Lungs: WNL ASA  Classification: 3 Mallampati/Airway Score: Two  Imaging: US  RENAL Result Date: 01/01/2024 CLINICAL DATA:  Acute kidney injury EXAM: RENAL / URINARY TRACT ULTRASOUND COMPLETE COMPARISON:  CT abdomen and pelvis dated 12/06/2023 FINDINGS: Right Kidney: Length = 11.8 cm AP renal pelvis diameter = <10 mm Diffusely increased cortical echogenicity with preserved corticomedullary differentiation which can be seen with medical renal disease. Extrarenal pelvis. No urinary tract dilation or shadowing calculi. The ureter is not seen. Left Kidney: Length = 12.5 cm AP renal pelvis diameter = <10 mm Diffusely increased cortical echogenicity with preserved corticomedullary differentiation which can be seen with medical renal disease. No urinary tract dilation or shadowing calculi. The ureter is not seen. Bladder: Appears normal for degree of bladder distention. Prevoid bladder volume measures 139 mL Other: None. IMPRESSION: Diffusely increased cortical echogenicity with preserved corticomedullary differentiation which can be seen with medical renal disease. No urinary tract dilation or shadowing calculi. Electronically Signed   By: Limin  Xu M.D.   On: 01/01/2024 13:19    Labs:  CBC: Recent Labs    01/03/24 0729 01/04/24 0702 01/05/24 0316 01/06/24 0313  WBC 8.5 9.2 6.8 8.7  HGB 7.9* 8.5* 8.7* 8.2*  HCT 24.4* 25.7* 27.1* 25.9*  PLT 120* 149* 152 174    COAGS: No results for input(s): INR, APTT in the last 8760  hours.  BMP: Recent Labs    01/04/24 0702 01/05/24 0316 01/06/24 0313 01/07/24 0738  NA 138 138 139 136  K 4.5 4.5 4.4 5.2*  CL 107 109 109 106  CO2 22 14* 21* 21*  GLUCOSE 216* 274* 149* 150*  BUN 37* 36* 36* 39*  CALCIUM  8.5* 8.2* 8.2* 8.1*  CREATININE 3.60* 3.69* 4.26* 4.04*  GFRNONAA 15* 14* 12* 13*    LIVER FUNCTION TESTS: Recent Labs    10/05/23 2010 10/06/23 0859 10/08/23 0413 12/31/23 0021 01/04/24 0702 01/07/24 0738  BILITOT 0.4 0.4  --  0.7 0.7  --   AST 18 16  --  22 14*  --   ALT 15 14  --  31 19  --   ALKPHOS 61 57  --  88 65  --   PROT 5.5* 5.3*  --  5.4* 5.3*  --   ALBUMIN  2.4* 2.2* 3.0* 2.3* 2.1* 2.2*    TUMOR MARKERS: No results for input(s): AFPTM, CEA, CA199, CHROMGRNA in the last 8760 hours.  Assessment and Plan: 48 y.o. female with DM with insulin  noncompliance and AKI on CKD with proteinuria, IR was requested for random renal biopsy.   VS tachycardic but regular rhythm, pt states that her heart beats fast all the time  CBC yesterday hgb 8.2, plt 174  K 5.2 today  No recent INR, will obtain tomorrow  Allergies reviewed  LD Lovenox  on 01/06/24 0910 hrs, on sq heparin    Risks and benefits of random renal biopsy was discussed with the patient and/or patient's family including, but not limited to bleeding, infection, damage to adjacent structures or low yield requiring additional tests.  All of the questions were answered and there is agreement to proceed.  Consent signed and in chart.  Tentatively scheduled for Monday AM.   PLAN - NPO except sips and med Monday MN - INR in AM - Continue to hold aspirin   - Monday 6 and 2 pm sq heparin  MAR held    Thank you for this interesting consult.  I greatly enjoyed meeting AVRIE KEDZIERSKI and look forward to participating in their care.  A copy of this report was sent to the requesting provider on this date.  Electronically Signed: Toya VEAR Cousin, PA-C 01/07/2024, 9:41 AM   I spent a  total of 40 Minutes    in face to face in clinical consultation, greater than 50% of which was counseling/coordinating care for random renal biopsy.   This chart was dictated using voice recognition software.  Despite best efforts to proofread,  errors can occur which can change the documentation meaning.

## 2024-01-08 LAB — RENAL FUNCTION PANEL
Albumin: 2 g/dL — ABNORMAL LOW (ref 3.5–5.0)
Anion gap: 6 (ref 5–15)
BUN: 37 mg/dL — ABNORMAL HIGH (ref 6–20)
CO2: 22 mmol/L (ref 22–32)
Calcium: 8 mg/dL — ABNORMAL LOW (ref 8.9–10.3)
Chloride: 110 mmol/L (ref 98–111)
Creatinine, Ser: 4.29 mg/dL — ABNORMAL HIGH (ref 0.44–1.00)
GFR, Estimated: 12 mL/min — ABNORMAL LOW (ref >60)
Glucose, Bld: 142 mg/dL — ABNORMAL HIGH (ref 70–99)
Phosphorus: 5 mg/dL — ABNORMAL HIGH (ref 2.5–4.6)
Potassium: 4.6 mmol/L (ref 3.5–5.1)
Sodium: 138 mmol/L (ref 135–145)

## 2024-01-08 LAB — GLUCOSE, CAPILLARY
Glucose-Capillary: 141 mg/dL — ABNORMAL HIGH (ref 70–99)
Glucose-Capillary: 212 mg/dL — ABNORMAL HIGH (ref 70–99)
Glucose-Capillary: 165 mg/dL — ABNORMAL HIGH (ref 70–99)

## 2024-01-08 LAB — PROTIME-INR
INR: 0.9 (ref 0.8–1.2)
Prothrombin Time: 12.6 s (ref 11.4–15.2)

## 2024-01-08 NOTE — Plan of Care (Signed)
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
## 2024-01-08 NOTE — Progress Notes (Signed)
 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  Also note nephrotic range proteinuria, microscopic hematuria, and anasarca - concerning for possible GN - Continue supportive care  - hold lisinopril  - IV albumin - getting   # Nephrotic range proteinuria  - UP/cr ratio is 6220 mg/g - she is most likely nephrotic range proteinuria and CKD from her uncontrolled diabetes and thus decreased renal reserve  - serologic work-up as below - SPEP and free light chains pending and UPEP is pending (listed currently under misc [facility] test (sendout)  - Add PLA2R antibody (under [facility] test - sendout) Agreeable to biopsy Have ordered, plan for monday    # Fluid volume overload - Strict ins/outs and daily weights    # Microscopic Hematuria - repeat UA with persistent RBC - ANA, complement, ANCA pending.  HIV non-reactive and hepatitis panel non-reactive   # Metabolic acidosis - secondary to AKI and nonketotic hyperosmolar state from insulin  noncompliance   # HTN  - slightly higher today   # Normocytic anemia  - may have had component from CKD - iron  deficiency - continue oral iron     Subjective:    Upset today--no coffee.  Took the albumin .  Cr up and down- Cr 4.0-4.29   Objective:   BP (!) 150/88   Pulse (!) 107   Temp 98.7 F (37.1 C) (Oral)   Resp 20   Ht 5' 8 (1.727 m)   Wt 87.9 kg   SpO2 95%   BMI 29.46 kg/m   Intake/Output Summary (Last 24 hours) at 01/08/2024 1323 Last data filed at 01/07/2024 1500 Gross per 24 hour  Intake 240 ml  Output --  Net 240 ml   Weight change:   Physical Exam: Gen:NAD HEENT: periorbital edema CVS: RRR Resp:nonlabored Abd: soft Ext: + anasarca  Imaging: No results found.  Labs: BMET Recent Labs  Lab 01/02/24 0120 01/03/24 0729 01/04/24 0702 01/05/24 0316 01/06/24 0313  01/07/24 0738 01/08/24 0434  NA 137 137 138 138 139 136 138  K 4.7 5.0 4.5 4.5 4.4 5.2* 4.6  CL 111 112* 107 109 109 106 110  CO2 20* 19* 22 14* 21* 21* 22  GLUCOSE 185* 235* 216* 274* 149* 150* 142*  BUN 32* 38* 37* 36* 36* 39* 37*  CREATININE 3.28* 3.53* 3.60* 3.69* 4.26* 4.04* 4.29*  CALCIUM  7.8* 8.1* 8.5* 8.2* 8.2* 8.1* 8.0*  PHOS 3.2 4.1 4.3 4.7*  --  4.9* 5.0*   CBC Recent Labs  Lab 01/03/24 0729 01/04/24 0702 01/05/24 0316 01/06/24 0313  WBC 8.5 9.2 6.8 8.7  NEUTROABS 6.1 6.4 4.8 6.1  HGB 7.9* 8.5* 8.7* 8.2*  HCT 24.4* 25.7* 27.1* 25.9*  MCV 84.7 84.0 85.2 84.9  PLT 120* 149* 152 174    Medications:     acetaminophen   325 mg Oral Once   amLODipine   10 mg Oral Daily   atorvastatin   40 mg Oral QHS   carvedilol   6.25 mg Oral BID WC   cyclobenzaprine   10 mg Oral BID   fluticasone  furoate-vilanterol  1 puff Inhalation Daily   gabapentin   100 mg Oral BID   heparin  injection (subcutaneous)  5,000 Units Subcutaneous Q8H   hydrALAZINE   100 mg Oral Q8H   insulin  aspart  0-5 Units Subcutaneous QHS   insulin  aspart  0-6 Units Subcutaneous TID WC   insulin  glargine  18  Units Subcutaneous Daily   iron  polysaccharides  150 mg Oral Daily   melatonin  3 mg Oral QHS   montelukast   10 mg Oral QHS   pantoprazole   40 mg Oral Daily   sodium bicarbonate   650 mg Oral BID   torsemide   40 mg Oral Daily    Almarie Bonine, MD 01/08/2024, 1:23 PM

## 2024-01-08 NOTE — Progress Notes (Signed)
 PROGRESS NOTE     Patient Demographics:    Sara [name], is a 48 y.o. female, DOB - 1975/05/06, FMW:[id number]  Outpatient Primary MD for the patient is Lenon Homer, MD    LOS - 8  Admit date - 12/30/2023    Chief Complaint  Patient presents with   Psychiatric Evaluation        Hyperglycemia   Hypertension    Pt non compliant with meds and having SI behavior issues at a group home.        Brief Narrative (HPI from H&P)    48 y.o. African-American female with medical history significant for GERD, depression, type 2 diabetes mellitus, H. pylori infection, hypertension, dyslipidemia and OSA, who presented to the emergency room with acute onset of psychiatric evaluation for suicidal ideation and hyperglycemia.  The patient admitted to polydipsia without significant polyuria.  No chest pain or palpitations.  No cough or wheezing or dyspnea.  No dysuria, oliguria or hematuria or flank pain.  In the ER was diagnosed with nonketotic hyperosmolar state from not taking her insulin  for a while, depression, suicidal ideation and she was admitted to the hospital.   Subjective:   Patient in bed, reports she is feeling better today, denies any complaints this morning, ambulating with staff in the hallway   Assessment  & Plan :   Type 2 diabetes mellitus with hyperosmolar nonketotic hyperglycemia (HCC)   - Due to noncompliance with insulin , was depressed and not taking it for several days, initially on IV insulin  drip now transition to subcu, counseled on compliance, dose adjusted further on 01/04/2024, continue to monitor. - Intermittently refusing insulin , at risk Hypoglycemia if rapidly uptitrating her insulin   good blood  sugar control, so for now we will allow for some hyperglycemia.  She has been compliant with her insulin  over the last 48 hours, dose increased to 18 units, better controlled.   Lab Results  Component Value Date   HGBA1C 8.6 (H) 10/06/2023   CBG (last 3)  Recent Labs    01/07/24 1313 01/07/24 1828 01/07/24 2145  GLUCAP 218* 126* 185*  Acute kidney injury superimposed on CKD stage IIIb  Nephrotic range proteinuria  Microscopic hematuria - Management per renal, plan for renal biopsy on Monday after aspirin  washout -Continue with bicarb - Continue to hold lisinopril  - Monitor as on torsemide  - Renal biopsy after aspirin  washout, Monday is the earliest. - Well decrease gabapentin  from 300 twice daily to 100 mg 3 times daily  GERD without esophagitis  - PPI  Hyperkalemia -Potassium 5.2 today, will give Lokelma   Dyslipidemia  - Will continue statin therapy.   Anemia of chronic kidney disease. Iron  deficiency anemia -  received 1 unit of packed RBC on 01/02/2024. - Continue with oral iron   Essential hypertension  -Blood pressure controlled on current regimen avoid hypotension in the setting of worsening renal function  Depression with suicidal ideation.  Sitter and psych eval. well require [facility] per psych when medically cleared will discuss with them to see if he can reevaluate today.        Condition - Fair  Family Communication  : None present at bedside, discussed with son by phone 12/11.  Code Status :  Full  Consults  :  Psych, Renal, IR  PUD Prophylaxis :  PPI   Procedures  :     Renal US  -  medical renal disease      Disposition Plan  : Inpatient psych, but patient is not medically cleared yet as will need biopsy, as well as creatinine keep trending up  Status is: Inpatient  DVT Prophylaxis  :    heparin  injection 5,000 Units Start: 01/07/24 1400   Lab Results  Component Value Date   PLT 174 01/06/2024    Diet :  Diet Order              Diet NPO time specified Except for: Sips with Meds  Diet effective midnight           Diet heart healthy/carb modified Room service appropriate? Yes; Fluid consistency: Thin  Diet effective now                    Inpatient Medications  Scheduled Meds:  acetaminophen   325 mg Oral Once   amLODipine   10 mg Oral Daily   atorvastatin   40 mg Oral QHS   carvedilol   6.25 mg Oral BID WC   cyclobenzaprine   10 mg Oral BID   fluticasone  furoate-vilanterol  1 puff Inhalation Daily   gabapentin   100 mg Oral BID   heparin  injection (subcutaneous)  5,000 Units Subcutaneous Q8H   hydrALAZINE   100 mg Oral Q8H   insulin  aspart  0-5 Units Subcutaneous QHS   insulin  aspart  0-6 Units Subcutaneous TID WC   insulin  glargine  18 Units Subcutaneous Daily   iron  polysaccharides  150 mg Oral Daily   melatonin  3 mg Oral QHS   montelukast   10 mg Oral QHS   pantoprazole   40 mg Oral Daily   sodium bicarbonate   650 mg Oral BID   torsemide   40 mg Oral Daily   Continuous Infusions:  albumin  human 25 g (01/08/24 0547)    PRN Meds:.acetaminophen  **OR** acetaminophen , albuterol , cyclobenzaprine , dextrose , ondansetron  **OR** ondansetron  (ZOFRAN ) IV, senna-docusate, sodium chloride , traZODone   Antibiotics  :    Anti-infectives (From admission, onward)    None         Objective:   Vitals:   01/08/24 0000 01/08/24 0113 01/08/24 0545 01/08/24 0933  BP:  129/74 (!) 150/88   Pulse:  100  (!) 107  Resp:  19  20  Temp:      TempSrc:      SpO2: 98% 100%  95%  Weight:      Height:        Wt Readings from Last 3 Encounters:  01/07/24 87.9 kg  10/07/23 64.5 kg  04/08/23 74.8 kg     Intake/Output Summary (Last 24 hours) at 01/08/2024 1016 Last data filed at 01/07/2024 1500 Gross per 24 hour  Intake 240 ml  Output --  Net 240 ml     Physical Exam  Awake, alert, no apparent distress Good air entry Abdomen soft +1 edema      Data Review:    Recent Labs  Lab 01/02/24 0258  01/03/24 0729 01/04/24 0702 01/05/24 0316 01/06/24 0313  WBC 9.9 8.5 9.2 6.8 8.7  HGB 7.7* 7.9* 8.5* 8.7* 8.2*  HCT 23.8* 24.4* 25.7* 27.1* 25.9*  PLT 151 120* 149* 152 174  MCV 83.8 84.7 84.0 85.2 84.9  MCH 27.1 27.4 27.8 27.4 26.9  MCHC 32.4 32.4 33.1 32.1 31.7  RDW 14.2 14.1 14.3 14.2 14.1  LYMPHSABS 2.5 1.6 1.8 1.3 1.5  MONOABS 0.7 0.6 0.7 0.6 0.9  EOSABS 0.1 0.1 0.1 0.1 0.1  BASOSABS 0.0 0.0 0.0 0.0 0.0    Recent Labs  Lab 01/02/24 0120 01/03/24 0729 01/04/24 0702 01/05/24 0316 01/06/24 0313 01/07/24 0738 01/08/24 0434  NA 137 137 138 138 139 136 138  K 4.7 5.0 4.5 4.5 4.4 5.2* 4.6  CL 111 112* 107 109 109 106 110  CO2 20* 19* 22 14* 21* 21* 22  ANIONGAP 6 6 9 15 9 9 6   GLUCOSE 185* 235* 216* 274* 149* 150* 142*  BUN 32* 38* 37* 36* 36* 39* 37*  CREATININE 3.28* 3.53* 3.60* 3.69* 4.26* 4.04* 4.29*  AST  --   --  14*  --   --   --   --   ALT  --   --  19  --   --   --   --   ALKPHOS  --   --  65  --   --   --   --   BILITOT  --   --  0.7  --   --   --   --   ALBUMIN   --   --  2.1*  --   --  2.2* 2.0*  INR  --   --   --   --   --   --  0.9  MG 1.6* 2.3 2.1 2.1  --   --   --   PHOS 3.2 4.1 4.3 4.7*  --  4.9* 5.0*  CALCIUM  7.8* 8.1* 8.5* 8.2* 8.2* 8.1* 8.0*      Recent Labs  Lab 01/02/24 0120 01/03/24 0729 01/04/24 0702 01/05/24 0316 01/06/24 0313 01/07/24 0738 01/08/24 0434  INR  --   --   --   --   --   --  0.9  MG 1.6* 2.3 2.1 2.1  --   --   --   CALCIUM  7.8* 8.1* 8.5* 8.2* 8.2* 8.1* 8.0*    --------------------------------------------------------------------------------------------------------------- Lab Results  Component Value Date   CHOL 215 (H) 01/10/2023   HDL 51 01/10/2023   LDLCALC 148 (H) 01/10/2023   TRIG 79 01/10/2023   CHOLHDL 4.2 01/10/2023    Lab Results  Component Value Date   HGBA1C 8.6 (H) 10/06/2023   No results for input(s): TSH, T4TOTAL, FREET4, T3FREE, THYROIDAB in the  last 72 hours. No results for  input(s): VITAMINB12, FOLATE, FERRITIN, TIBC, IRON , RETICCTPCT in the last 72 hours.  ------------------------------------------------------------------------------------------------------------------ Cardiac Enzymes No results for input(s): CKMB, TROPONINI, MYOGLOBIN in the last 168 hours.  Invalid input(s): CK  Micro Results No results found for this or any previous visit (from the past 240 hours).  Radiology Report No results found.    Signature  -   Brayton Lye M.D on 01/08/2024 at 10:16 AM   -  To page go to [email or web address]

## 2024-01-08 NOTE — TOC Progression Note (Signed)
 Transition of Care [facility]) - Progression Note    Patient Details  Name: Sara [name] MRN: [id number] Date of Birth: [birth year]  [facility] CM/SW Contact  Luann SHAUNNA [place], [place] Phone Number: 01/08/2024, 8:57 AM  Clinical Narrative:     ICM will continue to follow for eventual psych placement. Currently waiting for biopsy scheduled Monday then can be referred for inpatient psych.            Social Drivers of Health (SDOH) Interventions SDOH Screenings   Food Insecurity: Food Insecurity Present (12/31/2023)  Housing: High Risk (12/31/2023)  Transportation Needs: Unmet Transportation Needs (12/31/2023)  Utilities: Not At Risk (12/31/2023)  Alcohol Screen: Low Risk (10/14/2022)  Financial Resource Strain: At Risk (03/16/2023)   Received from [facility]  Physical Activity: Not at Risk (03/16/2023)   Received from [facility]  Social Connections: At Risk (03/16/2023)   Received from [facility]  Stress: Not at Risk (03/16/2023)   Received from [facility]  Tobacco Use: High Risk (01/07/2024)    Readmission Risk Interventions     No data to display

## 2024-01-08 NOTE — Progress Notes (Signed)
 Patient refused all medication this morning including blood pressure medicine. Patient educated and verbalized understanding of med refusal. MD notified of patient refusal.

## 2024-01-09 DIAGNOSIS — E1100 Type 2 diabetes mellitus with hyperosmolarity without nonketotic hyperglycemic-hyperosmolar coma (NKHHC): Secondary | ICD-10-CM | POA: Diagnosis not present

## 2024-01-09 LAB — RENAL FUNCTION PANEL
Albumin: 2.3 g/dL — ABNORMAL LOW (ref 3.5–5.0)
Anion gap: 8 (ref 5–15)
BUN: 37 mg/dL — ABNORMAL HIGH (ref 6–20)
CO2: 22 mmol/L (ref 22–32)
Calcium: 8 mg/dL — ABNORMAL LOW (ref 8.9–10.3)
Chloride: 109 mmol/L (ref 98–111)
Creatinine, Ser: 4.53 mg/dL — ABNORMAL HIGH (ref 0.44–1.00)
GFR, Estimated: 11 mL/min — ABNORMAL LOW (ref >60)
Glucose, Bld: 148 mg/dL — ABNORMAL HIGH (ref 70–99)
Phosphorus: 5 mg/dL — ABNORMAL HIGH (ref 2.5–4.6)
Potassium: 4.5 mmol/L (ref 3.5–5.1)
Sodium: 139 mmol/L (ref 135–145)

## 2024-01-09 LAB — CBC
HCT: 23.5 % — ABNORMAL LOW (ref 36.0–46.0)
Hemoglobin: 7.6 g/dL — ABNORMAL LOW (ref 12.0–15.0)
MCH: 27.2 pg (ref 26.0–34.0)
MCHC: 32.3 g/dL (ref 30.0–36.0)
MCV: 84.2 fL (ref 80.0–100.0)
Platelets: 174 K/uL (ref 150–400)
RBC: 2.79 MIL/uL — ABNORMAL LOW (ref 3.87–5.11)
RDW: 13.6 % (ref 11.5–15.5)
WBC: 7.7 K/uL (ref 4.0–10.5)
nRBC: 0 % (ref 0.0–0.2)

## 2024-01-09 LAB — GLUCOSE, CAPILLARY
Glucose-Capillary: 128 mg/dL — ABNORMAL HIGH (ref 70–99)
Glucose-Capillary: 189 mg/dL — ABNORMAL HIGH (ref 70–99)
Glucose-Capillary: 109 mg/dL — ABNORMAL HIGH (ref 70–99)
Glucose-Capillary: 187 mg/dL — ABNORMAL HIGH (ref 70–99)

## 2024-01-09 NOTE — Plan of Care (Signed)

## 2024-01-09 NOTE — Progress Notes (Signed)
 PROGRESS NOTE     Patient Demographics:    Sara [name], is a 48 y.o. female, DOB - [birth year], FMW:[id number]  Outpatient Primary MD for the patient is Lenon Homer, MD    LOS - 9  Admit date - 12/30/2023    Chief Complaint  Patient presents with   Psychiatric Evaluation        Hyperglycemia   Hypertension    Pt non compliant with meds and having SI behavior issues at a group home.        Brief Narrative (HPI from H&P)     48 y.o. African-American female with medical history significant for GERD, depression, type 2 diabetes mellitus, H. pylori infection, hypertension, dyslipidemia and OSA, who presented to the emergency room with acute onset of psychiatric evaluation for suicidal ideation and hyperglycemia.  The patient admitted to polydipsia without significant polyuria.  No chest pain or palpitations.  No cough or wheezing or dyspnea.  No dysuria, oliguria or hematuria or flank pain.  In the ER was diagnosed with nonketotic hyperosmolar state from not taking her insulin  for a while, depression, suicidal ideation and she was admitted to the hospital.   Subjective:   Patient in bed, she ambulated in the hallway yesterday, she denies any complaints this morning, no suicidal thoughts or ideations, she was seen by psych yesterday, with recommendation for outpatient psych low up   Assessment  & Plan :   Type 2 diabetes mellitus with hyperosmolar nonketotic hyperglycemia (HCC)   - Due to noncompliance with insulin , was depressed and not taking it for several days, initially on IV insulin  drip now transition to subcu insulin  . - Adjust dose cautiously as intermittently refusing her dose, to avoid hypoglycemia, but she  has been more compliant with her insulin  recently, so dose has been adjusted, CBG controlled on current 18 units , continue with insulin  sliding scale    Lab Results  Component Value Date   HGBA1C 8.6 (H) 10/06/2023   CBG (last 3)  Recent Labs    01/08/24 1624 01/08/24 2048 01/09/24 0858  GLUCAP 212* 165* 128*  Acute kidney injury superimposed on CKD stage IIIb  Nephrotic range proteinuria  Microscopic hematuria - Management per renal, plan for renal biopsy on Monday after aspirin  washout -Continue with bicarb - Continue to hold lisinopril  - Monitor as on torsemide  - Renal biopsy after aspirin  washout, Monday is the earliest. - Decrease gabapentin  due to worsening renal function  GERD without esophagitis  - PPI  Hyperkalemia - Resolved with Lokelma , monitor closely  Dyslipidemia  - Will continue statin therapy.   Anemia of chronic kidney disease. Iron  deficiency anemia -  received 1 unit of packed RBC on 01/02/2024. - Continue with oral iron   Essential hypertension  -Blood pressure controlled on current regimen avoid hypotension in the setting of worsening renal function  Depression with suicidal ideation. Major depressive order, severe, recurrent - Management per psychiatry, initial recommendation for safety sitter, suicide precaution, and inpatient psych recommendation, but she has been here already for 9 days, her symptoms has improved, per most recent psych evaluation 12/12, no further side precaution or inpatient psych admission required, recommendation for close outpatient psych follow-up, so we will go ahead and DC her suicide sitter       Condition - Fair  Family Communication  : None present at bedside, discussed with son by phone 12/11.  Code Status :  Full  Consults  :  Psych, Renal, IR  PUD Prophylaxis :  PPI   Procedures  :     Renal US  -  medical renal disease      Disposition Plan  : Inpatient psych, but patient is not medically  cleared yet as will need biopsy, as well as creatinine keep trending up  Status is: Inpatient  DVT Prophylaxis  :    heparin  injection 5,000 Units Start: 01/07/24 1400   Lab Results  Component Value Date   PLT 174 01/09/2024    Diet :  Diet Order             Diet NPO time specified Except for: Sips with Meds  Diet effective midnight           Diet heart healthy/carb modified Room service appropriate? Yes; Fluid consistency: Thin  Diet effective now                    Inpatient Medications  Scheduled Meds:  acetaminophen   325 mg Oral Once   amLODipine   10 mg Oral Daily   atorvastatin   40 mg Oral QHS   carvedilol   6.25 mg Oral BID WC   cyclobenzaprine   10 mg Oral BID   fluticasone  furoate-vilanterol  1 puff Inhalation Daily   gabapentin   100 mg Oral BID   heparin  injection (subcutaneous)  5,000 Units Subcutaneous Q8H   hydrALAZINE   100 mg Oral Q8H   insulin  aspart  0-5 Units Subcutaneous QHS   insulin  aspart  0-6 Units Subcutaneous TID WC   insulin  glargine  18 Units Subcutaneous Daily   iron  polysaccharides  150 mg Oral Daily   melatonin  3 mg Oral QHS   montelukast   10 mg Oral QHS   pantoprazole   40 mg Oral Daily   sodium bicarbonate   650 mg Oral BID   torsemide   40 mg Oral Daily   Continuous Infusions:    PRN Meds:.acetaminophen  **OR** acetaminophen , albuterol , cyclobenzaprine , dextrose , ondansetron  **OR** ondansetron  (ZOFRAN ) IV, senna-docusate, sodium chloride , traZODone   Antibiotics  :    Anti-infectives (From admission, onward)    None         Objective:  Vitals:   01/08/24 1624 01/08/24 2030 01/09/24 0818 01/09/24 0903  BP: 139/74 (!) 144/88  (!) 151/97  Pulse: (!) 119 (!) 101 (!) 101 (!) 112  Resp:  15 16 16   Temp: 98 F (36.7 C) 98.7 F (37.1 C)  98 F (36.7 C)  TempSrc:  Oral    SpO2: 100% 99%  100%  Weight:      Height:        Wt Readings from Last 3 Encounters:  01/07/24 87.9 kg  10/07/23 64.5 kg  04/08/23 74.8 kg      Intake/Output Summary (Last 24 hours) at 01/09/2024 0958 Last data filed at 01/08/2024 1856 Gross per 24 hour  Intake 480 ml  Output --  Net 480 ml     Physical Exam  Awake, Alert, no apparent distress Good air entry bilaterally Regular rate and rhythm Abdomen soft Trace edema bilaterally      Data Review:    Recent Labs  Lab 01/03/24 0729 01/04/24 0702 01/05/24 0316 01/06/24 0313 01/09/24 0422  WBC 8.5 9.2 6.8 8.7 7.7  HGB 7.9* 8.5* 8.7* 8.2* 7.6*  HCT 24.4* 25.7* 27.1* 25.9* 23.5*  PLT 120* 149* 152 174 174  MCV 84.7 84.0 85.2 84.9 84.2  MCH 27.4 27.8 27.4 26.9 27.2  MCHC 32.4 33.1 32.1 31.7 32.3  RDW 14.1 14.3 14.2 14.1 13.6  LYMPHSABS 1.6 1.8 1.3 1.5  --   MONOABS 0.6 0.7 0.6 0.9  --   EOSABS 0.1 0.1 0.1 0.1  --   BASOSABS 0.0 0.0 0.0 0.0  --     Recent Labs  Lab 01/03/24 0729 01/04/24 0702 01/05/24 0316 01/06/24 0313 01/07/24 0738 01/08/24 0434 01/09/24 0422  NA 137 138 138 139 136 138 139  K 5.0 4.5 4.5 4.4 5.2* 4.6 4.5  CL 112* 107 109 109 106 110 109  CO2 19* 22 14* 21* 21* 22 22  ANIONGAP 6 9 15 9 9 6 8   GLUCOSE 235* 216* 274* 149* 150* 142* 148*  BUN 38* 37* 36* 36* 39* 37* 37*  CREATININE 3.53* 3.60* 3.69* 4.26* 4.04* 4.29* 4.53*  AST  --  14*  --   --   --   --   --   ALT  --  19  --   --   --   --   --   ALKPHOS  --  65  --   --   --   --   --   BILITOT  --  0.7  --   --   --   --   --   ALBUMIN   --  2.1*  --   --  2.2* 2.0* 2.3*  INR  --   --   --   --   --  0.9  --   MG 2.3 2.1 2.1  --   --   --   --   PHOS 4.1 4.3 4.7*  --  4.9* 5.0* 5.0*  CALCIUM  8.1* 8.5* 8.2* 8.2* 8.1* 8.0* 8.0*      Recent Labs  Lab 01/03/24 0729 01/04/24 0702 01/05/24 0316 01/06/24 0313 01/07/24 0738 01/08/24 0434 01/09/24 0422  INR  --   --   --   --   --  0.9  --   MG 2.3 2.1 2.1  --   --   --   --   CALCIUM  8.1* 8.5* 8.2* 8.2* 8.1* 8.0* 8.0*     --------------------------------------------------------------------------------------------------------------- Lab Results  Component Value  Date   CHOL 215 (H) 01/10/2023   HDL 51 01/10/2023   LDLCALC 148 (H) 01/10/2023   TRIG 79 01/10/2023   CHOLHDL 4.2 01/10/2023    Lab Results  Component Value Date   HGBA1C 8.6 (H) 10/06/2023   No results for input(s): TSH, T4TOTAL, FREET4, T3FREE, THYROIDAB in the last 72 hours. No results for input(s): VITAMINB12, FOLATE, FERRITIN, TIBC, IRON , RETICCTPCT in the last 72 hours.  ------------------------------------------------------------------------------------------------------------------ Cardiac Enzymes No results for input(s): CKMB, TROPONINI, MYOGLOBIN in the last 168 hours.  Invalid input(s): CK  Micro Results No results found for this or any previous visit (from the past 240 hours).  Radiology Report No results found.    Signature  -   Brayton Lye M.D on 01/09/2024 at 9:58 AM   -  To page go to [email or web address]

## 2024-01-09 NOTE — Progress Notes (Signed)
 [facility] Progress Note   Assessment/ Plan:   # AKI on CKD 3b/IV - Most likely secondary to uncontrolled DM and nonketotic hyperosmolar state from insulin  noncompliance.  Also note nephrotic range proteinuria, microscopic hematuria, and anasarca - concerning for possible GN - Continue supportive care  - hold lisinopril  - IV albumin -received - torsemide  40 mg daily   # Nephrotic range proteinuria  - UP/cr ratio is 6220 mg/g - she is most likely nephrotic range proteinuria and CKD from her uncontrolled diabetes and thus decreased renal reserve  - serologic work-up as below - SPEP and free light chains pending and UPEP is pending (listed currently under misc [facility] test (sendout)  - Add PLA2R antibody (under misc [facility] test - sendout) Agreeable to biopsy Have ordered, plan for monday    # Fluid volume overload - Strict ins/outs and daily weights    # Microscopic Hematuria - repeat UA with persistent RBC - ANA, complement, ANCA pending.  HIV non-reactive and hepatitis panel non-reactive   # Metabolic acidosis - secondary to AKI and nonketotic hyperosmolar state from insulin  noncompliance   # HTN  - slightly higher today   # Normocytic anemia  - may have had component from CKD - iron  deficiency - continue oral iron     Subjective:    Sitting in the chair and doing better today.  No big issues   Objective:   BP (!) 151/97   Pulse (!) 112   Temp 98 F (36.7 C)   Resp 16   Ht 5' 8 (1.727 m)   Wt 87.9 kg   SpO2 100%   BMI 29.46 kg/m   Intake/Output Summary (Last 24 hours) at 01/09/2024 1127 Last data filed at 01/08/2024 1856 Gross per 24 hour  Intake 480 ml  Output --  Net 480 ml   Weight change:   Physical Exam: Gen:NAD HEENT: periorbital edema CVS: RRR Resp:nonlabored Abd: soft Ext: + anasarca  Imaging: No results found.  Labs: BMET Recent Labs  Lab 01/03/24 0729 01/04/24 0702 01/05/24 0316 01/06/24 0313 01/07/24 0738  01/08/24 0434 01/09/24 0422  NA 137 138 138 139 136 138 139  K 5.0 4.5 4.5 4.4 5.2* 4.6 4.5  CL 112* 107 109 109 106 110 109  CO2 19* 22 14* 21* 21* 22 22  GLUCOSE 235* 216* 274* 149* 150* 142* 148*  BUN 38* 37* 36* 36* 39* 37* 37*  CREATININE 3.53* 3.60* 3.69* 4.26* 4.04* 4.29* 4.53*  CALCIUM  8.1* 8.5* 8.2* 8.2* 8.1* 8.0* 8.0*  PHOS 4.1 4.3 4.7*  --  4.9* 5.0* 5.0*   CBC Recent Labs  Lab 01/03/24 0729 01/04/24 0702 01/05/24 0316 01/06/24 0313 01/09/24 0422  WBC 8.5 9.2 6.8 8.7 7.7  NEUTROABS 6.1 6.4 4.8 6.1  --   HGB 7.9* 8.5* 8.7* 8.2* 7.6*  HCT 24.4* 25.7* 27.1* 25.9* 23.5*  MCV 84.7 84.0 85.2 84.9 84.2  PLT 120* 149* 152 174 174    Medications:     acetaminophen   325 mg Oral Once   amLODipine   10 mg Oral Daily   atorvastatin   40 mg Oral QHS   carvedilol   6.25 mg Oral BID WC   cyclobenzaprine   10 mg Oral BID   fluticasone  furoate-vilanterol  1 puff Inhalation Daily   gabapentin   100 mg Oral BID   heparin  injection (subcutaneous)  5,000 Units Subcutaneous Q8H   hydrALAZINE   100 mg Oral Q8H   insulin  aspart  0-5 Units Subcutaneous QHS   insulin  aspart  0-6 Units Subcutaneous TID WC   insulin  glargine  18 Units Subcutaneous Daily   iron  polysaccharides  150 mg Oral Daily   melatonin  3 mg Oral QHS   montelukast   10 mg Oral QHS   pantoprazole   40 mg Oral Daily   sodium bicarbonate   650 mg Oral BID   torsemide   40 mg Oral Daily    Almarie Bonine, MD 01/09/2024, 11:27 AM

## 2024-01-09 NOTE — Progress Notes (Shared)
 Pt requested PIV to be removed d/t pain.

## 2024-01-10 DIAGNOSIS — F332 Major depressive disorder, recurrent severe without psychotic features: Secondary | ICD-10-CM

## 2024-01-10 LAB — CBC
HCT: 24.2 % — ABNORMAL LOW (ref 36.0–46.0)
Hemoglobin: 7.8 g/dL — ABNORMAL LOW (ref 12.0–15.0)
MCH: 27.2 pg (ref 26.0–34.0)
MCHC: 32.2 g/dL (ref 30.0–36.0)
MCV: 84.3 fL (ref 80.0–100.0)
Platelets: 165 K/uL (ref 150–400)
RBC: 2.87 MIL/uL — ABNORMAL LOW (ref 3.87–5.11)
RDW: 13.7 % (ref 11.5–15.5)
WBC: 7.1 K/uL (ref 4.0–10.5)
nRBC: 0 % (ref 0.0–0.2)

## 2024-01-10 LAB — CBC WITH DIFFERENTIAL/PLATELET
Abs Immature Granulocytes: 0.02 K/uL (ref 0.00–0.07)
Basophils Absolute: 0 K/uL (ref 0.0–0.1)
Basophils Relative: 0 %
Eosinophils Absolute: 0.1 K/uL (ref 0.0–0.5)
Eosinophils Relative: 2 %
HCT: 23.2 % — ABNORMAL LOW (ref 36.0–46.0)
Hemoglobin: 7.6 g/dL — ABNORMAL LOW (ref 12.0–15.0)
Immature Granulocytes: 0 %
Lymphocytes Relative: 26 %
Lymphs Abs: 1.9 K/uL (ref 0.7–4.0)
MCH: 27.7 pg (ref 26.0–34.0)
MCHC: 32.8 g/dL (ref 30.0–36.0)
MCV: 84.7 fL (ref 80.0–100.0)
Monocytes Absolute: 0.7 K/uL (ref 0.1–1.0)
Monocytes Relative: 9 %
Neutro Abs: 4.8 K/uL (ref 1.7–7.7)
Neutrophils Relative %: 63 %
Platelets: 161 K/uL (ref 150–400)
RBC: 2.74 MIL/uL — ABNORMAL LOW (ref 3.87–5.11)
RDW: 13.5 % (ref 11.5–15.5)
WBC: 7.5 K/uL (ref 4.0–10.5)
nRBC: 0 % (ref 0.0–0.2)

## 2024-01-10 LAB — RENAL FUNCTION PANEL
Albumin: 2.2 g/dL — ABNORMAL LOW (ref 3.5–5.0)
Anion gap: 8 (ref 5–15)
BUN: 36 mg/dL — ABNORMAL HIGH (ref 6–20)
CO2: 22 mmol/L (ref 22–32)
Calcium: 8.1 mg/dL — ABNORMAL LOW (ref 8.9–10.3)
Chloride: 107 mmol/L (ref 98–111)
Creatinine, Ser: 4.52 mg/dL — ABNORMAL HIGH (ref 0.44–1.00)
GFR, Estimated: 11 mL/min — ABNORMAL LOW (ref >60)
Glucose, Bld: 172 mg/dL — ABNORMAL HIGH (ref 70–99)
Phosphorus: 5.3 mg/dL — ABNORMAL HIGH (ref 2.5–4.6)
Potassium: 4.6 mmol/L (ref 3.5–5.1)
Sodium: 137 mmol/L (ref 135–145)

## 2024-01-10 LAB — GLUCOSE, CAPILLARY
Glucose-Capillary: 166 mg/dL — ABNORMAL HIGH (ref 70–99)
Glucose-Capillary: 178 mg/dL — ABNORMAL HIGH (ref 70–99)
Glucose-Capillary: 129 mg/dL — ABNORMAL HIGH (ref 70–99)
Glucose-Capillary: 187 mg/dL — ABNORMAL HIGH (ref 70–99)

## 2024-01-10 MED ORDER — DARBEPOETIN ALFA 100 MCG/0.5ML IJ SOSY
100.0000 ug | PREFILLED_SYRINGE | INTRAMUSCULAR | Status: AC
  Administered 2024-01-17: 100 ug via SUBCUTANEOUS
  Filled 2024-01-10 (×2): qty 0.5

## 2024-01-10 MED ORDER — CARVEDILOL 12.5 MG PO TABS
12.5000 mg | ORAL_TABLET | Freq: Two times a day (BID) | ORAL | Status: DC
  Filled 2024-01-10 (×4): qty 1

## 2024-01-10 MED ORDER — HYDRALAZINE HCL 20 MG/ML IJ SOLN: 10.0000 mg | Freq: Four times a day (QID) | INTRAMUSCULAR | Status: DC | PRN

## 2024-01-10 NOTE — Progress Notes (Addendum)
 [facility] Progress Note   Assessment/ Plan:   # AKI on CKD 3b/IV - Most likely secondary to uncontrolled DM and nonketotic hyperosmolar state from insulin  noncompliance.  Also note nephrotic range proteinuria, microscopic hematuria, and anasarca - concerning for possible GN - Continue supportive care  - hold lisinopril  - IV albumin -received - torsemide  40 mg daily   # Nephrotic range proteinuria  - UP/cr ratio is 6220 mg/g - she is most likely nephrotic range proteinuria and CKD from her uncontrolled diabetes and thus decreased renal reserve  - serologic work-up with + ANA and SSB, 6 g proteinuria - Add PLA2R antibody (under [facility] test - sendout) Agreeable to biopsy Have ordered, plan for Monday- requisition form in paper chart Eyeing Hgb closely- Aranesp , oral iron    # Metabolic acidosis - not a big issues now   # HTN  - slightly higher today   # Normocytic anemia  - may have had component from CKD - iron  deficiency - continue oral iron  -Aranesp  100 mcg weekly (01/10/24)    Subjective:    Seen in room.  Nervous about biopsy tomorrow   Objective:   BP 124/71   Pulse (!) 105   Temp 98.1 F (36.7 C) (Oral)   Resp 16   Ht 5' 8 (1.727 m)   Wt 87.9 kg   SpO2 99%   BMI 29.46 kg/m  No intake or output data in the 24 hours ending 01/10/24 1041  Weight change:   Physical Exam: Gen:NAD HEENT: periorbital edema CVS: RRR Resp:nonlabored Abd: soft Ext: + anasarca  Imaging: No results found.  Labs: BMET Recent Labs  Lab 01/04/24 0702 01/05/24 0316 01/06/24 0313 01/07/24 0738 01/08/24 0434 01/09/24 0422 01/10/24 0533  NA 138 138 139 136 138 139 137  K 4.5 4.5 4.4 5.2* 4.6 4.5 4.6  CL 107 109 109 106 110 109 107  CO2 22 14* 21* 21* 22 22 22   GLUCOSE 216* 274* 149* 150* 142* 148* 172*  BUN 37* 36* 36* 39* 37* 37* 36*  CREATININE 3.60* 3.69* 4.26* 4.04* 4.29* 4.53* 4.52*  CALCIUM  8.5* 8.2* 8.2* 8.1* 8.0* 8.0* 8.1*  PHOS 4.3 4.7*   --  4.9* 5.0* 5.0* 5.3*   CBC Recent Labs  Lab 01/04/24 0702 01/05/24 0316 01/06/24 0313 01/09/24 0422 01/10/24 0716  WBC 9.2 6.8 8.7 7.7 7.1  NEUTROABS 6.4 4.8 6.1  --   --   HGB 8.5* 8.7* 8.2* 7.6* 7.8*  HCT 25.7* 27.1* 25.9* 23.5* 24.2*  MCV 84.0 85.2 84.9 84.2 84.3  PLT 149* 152 174 174 165    Medications:     acetaminophen   325 mg Oral Once   amLODipine   10 mg Oral Daily   atorvastatin   40 mg Oral QHS   carvedilol   6.25 mg Oral BID WC   cyclobenzaprine   10 mg Oral BID   darbepoetin (ARANESP ) injection - DIALYSIS  100 mcg Subcutaneous Q Sun-1800   fluticasone  furoate-vilanterol  1 puff Inhalation Daily   gabapentin   100 mg Oral BID   hydrALAZINE   100 mg Oral Q8H   insulin  aspart  0-5 Units Subcutaneous QHS   insulin  aspart  0-6 Units Subcutaneous TID WC   insulin  glargine  18 Units Subcutaneous Daily   iron  polysaccharides  150 mg Oral Daily   melatonin  3 mg Oral QHS   montelukast   10 mg Oral QHS   pantoprazole   40 mg Oral Daily   sodium bicarbonate   650 mg Oral BID  torsemide   40 mg Oral Daily    Almarie Bonine, MD 01/10/2024, 10:41 AM
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
## 2024-01-10 NOTE — Progress Notes (Signed)
 PROGRESS NOTE     Patient Demographics:    Sara [name], is a 48 y.o. female, DOB - Aug 23, 1975, FMW:[id number]  Outpatient Primary MD for the patient is Lenon Homer, MD    LOS - 10  Admit date - 12/30/2023    Chief Complaint  Patient presents with   Psychiatric Evaluation        Hyperglycemia   Hypertension    Pt non compliant with meds and having SI behavior issues at a group home.        Brief Narrative (HPI from H&P)     48 y.o. African-American female with medical history significant for GERD, depression, type 2 diabetes mellitus, H. pylori infection, hypertension, dyslipidemia and OSA, who presented to the emergency room with acute onset of psychiatric evaluation for suicidal ideation and hyperglycemia.  The patient admitted to polydipsia without significant polyuria.  No chest pain or palpitations.  No cough or wheezing or dyspnea.  No dysuria, oliguria or hematuria or flank pain.  In the ER was diagnosed with nonketotic hyperosmolar state from not taking her insulin  for a while, depression, suicidal ideation and she was admitted to the hospital.  She was seen by psychiatry, nephrology, anion gap has closed, she has significant noncompliance she was seen by psych who initially recommended inpatient psych admission for suicidal ideation, but she had prolonged hospital stay for which she was cleared later by psychiatry, neurology recommended biopsy which is planned for 01/11/2024.   Subjective:   Patient sitting in chair, reports she has ambulated multiple times yesterday, no suicidal thoughts or ideations, reports good oral intake, no nausea, no vomiting.    Assessment  & Plan :   Type 2 diabetes mellitus  with hyperosmolar nonketotic hyperglycemia (HCC)   - Due to noncompliance with insulin , was depressed and not taking it for several days, initially on IV insulin  drip now transition to subcu insulin  . - Adjust dose cautiously as intermittently refusing her dose, to avoid hypoglycemia, but she has been more compliant with her insulin  recently, so dose has been adjusted, CBG controlled on current 18 units ,  continue with insulin  sliding scale    Lab Results  Component Value Date   HGBA1C 8.6 (H) 10/06/2023   CBG (last 3)  Recent Labs    01/09/24 2114 01/10/24 0725 01/10/24 1139  GLUCAP 187* 166* 178*      Acute kidney injury superimposed on CKD stage IIIb  Nephrotic range proteinuria  Microscopic hematuria - Management per renal, plan for renal biopsy on Monday after aspirin  washout -Continue with bicarb - Continue to hold lisinopril  - Monitor as on torsemide  - serologic work-up with + ANA and SSB, 6 g proteinuria  - Renal biopsy after aspirin  washout, plan for tomorrow. - Decrease gabapentin  due to worsening renal function  GERD without esophagitis  - PPI  Hyperkalemia - Resolved with Lokelma , monitor closely  Dyslipidemia  - Will continue statin therapy.   Anemia of chronic kidney disease. Iron  deficiency anemia -  received 1 unit of packed RBC on 01/02/2024. - Continue with oral iron   Essential hypertension  - avoid hypotension in the setting of worsening renal function - Pressure is elevated, continue with amlodipine , hydralazine , will increase her Coreg  from 6.25 to 12.5 mg twice daily - Continue with as needed hydralazine   Depression with suicidal ideation. Major depressive order, severe, recurrent - Management per psychiatry, initial recommendation for safety sitter, suicide precaution, and inpatient psych recommendation, but she has been here already for 9 days, her symptoms has improved, per most recent psych evaluation 12/12, no further side precaution or  inpatient psych admission required, recommendation for close outpatient psych follow-up, so suicide precautions/sitter has been discontinued       Condition - Fair  Family Communication  : None present at bedside, discussed with son by phone 12/11.  Code Status :  Full  Consults  :  Psych, Renal, IR  PUD Prophylaxis :  PPI  DVT Prophylaxis  : [place] heparin  held today anticipation of procedure tomorrow, will resume after biopsy   Procedures  :     Renal US  -  medical renal disease      Disposition Plan  : Inpatient psych, but patient is not medically cleared yet as will need biopsy, as well as creatinine keep trending up  Status is: Inpatient       Lab Results  Component Value Date   PLT 165 01/10/2024    Diet :  Diet Order             Diet NPO time specified  Diet effective ____           Diet regular Room service appropriate? Yes; Fluid consistency: Thin  Diet effective now                    Inpatient Medications  Scheduled Meds:  acetaminophen   325 mg Oral Once   amLODipine   10 mg Oral Daily   atorvastatin   40 mg Oral QHS   carvedilol   6.25 mg Oral BID WC   cyclobenzaprine   10 mg Oral BID   darbepoetin (ARANESP ) injection - DIALYSIS  100 mcg Subcutaneous Q Sun-1800   fluticasone  furoate-vilanterol  1 puff Inhalation Daily   gabapentin   100 mg Oral BID   hydrALAZINE   100 mg Oral Q8H   insulin  aspart  0-5 Units Subcutaneous QHS   insulin  aspart  0-6 Units Subcutaneous TID WC   insulin  glargine  18 Units Subcutaneous Daily   iron  polysaccharides  150 mg Oral Daily   melatonin  3 mg Oral QHS   montelukast   10  mg Oral QHS   pantoprazole   40 mg Oral Daily   sodium bicarbonate   650 mg Oral BID   torsemide   40 mg Oral Daily   Continuous Infusions:    PRN Meds:.acetaminophen  **OR** acetaminophen , albuterol , cyclobenzaprine , dextrose , ondansetron  **OR** ondansetron  (ZOFRAN ) IV, senna-docusate, sodium chloride , traZODone   Antibiotics  :     Anti-infectives (From admission, onward)    None         Objective:   Vitals:   01/10/24 0641 01/10/24 0722 01/10/24 0757 01/10/24 1136  BP: 124/71   (!) 144/84  Pulse:    (!) 109  Resp:      Temp:  98.1 F (36.7 C)  98.5 F (36.9 C)  TempSrc:  Oral  Oral  SpO2:   99%   Weight:      Height:        Wt Readings from Last 3 Encounters:  01/07/24 87.9 kg  10/07/23 64.5 kg  04/08/23 74.8 kg    No intake or output data in the 24 hours ending 01/10/24 1155    Physical Exam  Weight, alert, no apparent distress Good air entry bilaterally Mildly tachycardic Abdomen soft Trace edema      Data Review:    Recent Labs  Lab 01/04/24 0702 01/05/24 0316 01/06/24 0313 01/09/24 0422 01/10/24 0716  WBC 9.2 6.8 8.7 7.7 7.1  HGB 8.5* 8.7* 8.2* 7.6* 7.8*  HCT 25.7* 27.1* 25.9* 23.5* 24.2*  PLT 149* 152 174 174 165  MCV 84.0 85.2 84.9 84.2 84.3  MCH 27.8 27.4 26.9 27.2 27.2  MCHC 33.1 32.1 31.7 32.3 32.2  RDW 14.3 14.2 14.1 13.6 13.7  LYMPHSABS 1.8 1.3 1.5  --   --   MONOABS 0.7 0.6 0.9  --   --   EOSABS 0.1 0.1 0.1  --   --   BASOSABS 0.0 0.0 0.0  --   --     Recent Labs  Lab 01/04/24 0702 01/05/24 0316 01/06/24 0313 01/07/24 0738 01/08/24 0434 01/09/24 0422 01/10/24 0533  NA 138 138 139 136 138 139 137  K 4.5 4.5 4.4 5.2* 4.6 4.5 4.6  CL 107 109 109 106 110 109 107  CO2 22 14* 21* 21* 22 22 22   ANIONGAP 9 15 9 9 6 8 8   GLUCOSE 216* 274* 149* 150* 142* 148* 172*  BUN 37* 36* 36* 39* 37* 37* 36*  CREATININE 3.60* 3.69* 4.26* 4.04* 4.29* 4.53* 4.52*  AST 14*  --   --   --   --   --   --   ALT 19  --   --   --   --   --   --   ALKPHOS 65  --   --   --   --   --   --   BILITOT 0.7  --   --   --   --   --   --   ALBUMIN  2.1*  --   --  2.2* 2.0* 2.3* 2.2*  INR  --   --   --   --  0.9  --   --   MG 2.1 2.1  --   --   --   --   --   PHOS 4.3 4.7*  --  4.9* 5.0* 5.0* 5.3*  CALCIUM  8.5* 8.2* 8.2* 8.1* 8.0* 8.0* 8.1*      Recent Labs  Lab  01/04/24 9297 01/05/24 0316 01/06/24 0313 01/07/24 9261 01/08/24 0434 01/09/24 0422 01/10/24 0533  INR  --   --   --   --  0.9  --   --   MG 2.1 2.1  --   --   --   --   --   CALCIUM  8.5* 8.2* 8.2* 8.1* 8.0* 8.0* 8.1*    --------------------------------------------------------------------------------------------------------------- Lab Results  Component Value Date   CHOL 215 (H) 01/10/2023   HDL 51 01/10/2023   LDLCALC 148 (H) 01/10/2023   TRIG 79 01/10/2023   CHOLHDL 4.2 01/10/2023    Lab Results  Component Value Date   HGBA1C 8.6 (H) 10/06/2023   No results for input(s): TSH, T4TOTAL, FREET4, T3FREE, THYROIDAB in the last 72 hours. No results for input(s): VITAMINB12, FOLATE, FERRITIN, TIBC, IRON , RETICCTPCT in the last 72 hours.  ------------------------------------------------------------------------------------------------------------------ Cardiac Enzymes No results for input(s): CKMB, TROPONINI, MYOGLOBIN in the last 168 hours.  Invalid input(s): CK  Micro Results No results found for this or any previous visit (from the past 240 hours).  Radiology Report No results found.    Signature  -   Brayton Lye M.D on 01/10/2024 at 11:55 AM   -  To page go to [email or web address]

## 2024-01-11 ENCOUNTER — Inpatient Hospital Stay (HOSPITAL_COMMUNITY): Payer: MEDICAID

## 2024-01-11 ENCOUNTER — Other Ambulatory Visit (HOSPITAL_COMMUNITY): Payer: Self-pay

## 2024-01-11 LAB — RENAL FUNCTION PANEL
Albumin: 2.3 g/dL — ABNORMAL LOW (ref 3.5–5.0)
Anion gap: 9 (ref 5–15)
BUN: 34 mg/dL — ABNORMAL HIGH (ref 6–20)
CO2: 20 mmol/L — ABNORMAL LOW (ref 22–32)
Calcium: 8.1 mg/dL — ABNORMAL LOW (ref 8.9–10.3)
Chloride: 109 mmol/L (ref 98–111)
Creatinine, Ser: 4.5 mg/dL — ABNORMAL HIGH (ref 0.44–1.00)
GFR, Estimated: 11 mL/min — ABNORMAL LOW (ref >60)
Glucose, Bld: 143 mg/dL — ABNORMAL HIGH (ref 70–99)
Phosphorus: 5 mg/dL — ABNORMAL HIGH (ref 2.5–4.6)
Potassium: 4.7 mmol/L (ref 3.5–5.1)
Sodium: 138 mmol/L (ref 135–145)

## 2024-01-11 LAB — GLUCOSE, CAPILLARY
Glucose-Capillary: 163 mg/dL — ABNORMAL HIGH (ref 70–99)
Glucose-Capillary: 144 mg/dL — ABNORMAL HIGH (ref 70–99)
Glucose-Capillary: 217 mg/dL — ABNORMAL HIGH (ref 70–99)
Glucose-Capillary: 119 mg/dL — ABNORMAL HIGH (ref 70–99)

## 2024-01-11 MED ORDER — MIDAZOLAM HCL 2 MG/2ML IJ SOLN
INTRAMUSCULAR | Status: AC
  Filled 2024-01-11: qty 2

## 2024-01-11 MED ORDER — MIDAZOLAM HCL (PF) 2 MG/2ML IJ SOLN
INTRAMUSCULAR | Status: AC | PRN
  Administered 2024-01-11 (×2): 1 mg via INTRAVENOUS

## 2024-01-11 MED ORDER — FENTANYL CITRATE (PF) 100 MCG/2ML IJ SOLN
INTRAMUSCULAR | Status: AC
  Filled 2024-01-11: qty 2

## 2024-01-11 MED ORDER — LIDOCAINE HCL (PF) 1 % IJ SOLN
10.0000 mL | Freq: Once | INTRAMUSCULAR | Status: AC
  Administered 2024-01-11: 12:00:00 10 mL via INTRADERMAL

## 2024-01-11 MED ORDER — GELATIN ABSORBABLE 12-7 MM EX MISC
1.0000 | Freq: Once | CUTANEOUS | Status: AC
  Administered 2024-01-11: 12:00:00 1 via TOPICAL

## 2024-01-11 MED ORDER — FENTANYL CITRATE (PF) 100 MCG/2ML IJ SOLN
INTRAMUSCULAR | Status: AC | PRN
  Administered 2024-01-11 (×2): 50 ug via INTRAVENOUS

## 2024-01-11 MED ORDER — LORAZEPAM 2 MG/ML IJ SOLN
1.0000 mg | Freq: Once | INTRAMUSCULAR | Status: AC | PRN
  Administered 2024-01-11: 11:00:00 1 mg via INTRAVENOUS
  Filled 2024-01-11: qty 1

## 2024-01-11 MED ORDER — HYDROMORPHONE HCL 1 MG/ML IJ SOLN
0.5000 mg | INTRAMUSCULAR | Status: DC | PRN
  Administered 2024-01-11 – 2024-01-14 (×9): 0.5 mg via INTRAVENOUS
  Filled 2024-01-11 (×9): qty 0.5

## 2024-01-11 MED ORDER — ALBUTEROL SULFATE (2.5 MG/3ML) 0.083% IN NEBU
2.5000 mg | INHALATION_SOLUTION | Freq: Once | RESPIRATORY_TRACT | Status: AC
  Administered 2024-01-11: 05:00:00 2.5 mg via RESPIRATORY_TRACT
  Filled 2024-01-11: qty 3

## 2024-01-11 MED ORDER — HEPARIN SODIUM (PORCINE) 5000 UNIT/ML IJ SOLN
5000.0000 [IU] | Freq: Three times a day (TID) | INTRAMUSCULAR | Status: DC
  Administered 2024-01-11 – 2024-01-19 (×18): 5000 [IU] via SUBCUTANEOUS
  Filled 2024-01-11 (×25): qty 1

## 2024-01-11 NOTE — TOC Progression Note (Signed)
 Transition of Care [facility]) - Progression Note    Patient Details  Name: Sara [name] MRN: [id number] Date of Birth: 1975-04-29  [facility] CM/SW Contact  Landry DELENA Senters, RN Phone Number: 01/11/2024, 2:14 PM  Clinical Narrative:     CM spoke with patient about d/c plan since she has been cleared by psychiatry. Patient reports she was staying at [facility] in [place] prior to hospitalization and plans to return there. Patient does not want to inquire about staying with any family or friends. CM did contact [facility] and they report patient does have a spot to stay there.   Patient reports she has f/u plan in place with [facility] for outpatient counseling services. She has done this previously.  Patient will need transportation to [employer] in [place] at d/c.   Patient does not have transportation assistance set up through insurance. CM added info for [employer] to AVS for patient to set up transportation.   CM will continue to follow.                     Expected Discharge Plan and Services                                               Social Drivers of Health (SDOH) Interventions SDOH Screenings   Food Insecurity: Food Insecurity Present (12/31/2023)  Housing: High Risk (12/31/2023)  Transportation Needs: Unmet Transportation Needs (12/31/2023)  Utilities: Not At Risk (12/31/2023)  Alcohol Screen: Low Risk (10/14/2022)  Financial Resource Strain: At Risk (03/16/2023)   Received from [facility]  Physical Activity: Not at Risk (03/16/2023)   Received from [facility]  Social Connections: At Risk (03/16/2023)   Received from [facility]  Stress: Not at Risk (03/16/2023)   Received from [facility]  Tobacco Use: High Risk (01/07/2024)    Readmission Risk Interventions     No data to display

## 2024-01-11 NOTE — Plan of Care (Signed)
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
## 2024-01-11 NOTE — Progress Notes (Signed)
 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  Also note nephrotic range proteinuria, microscopic hematuria, and anasarca - concerning for possible GN - Continue supportive care  - hold lisinopril  - continue with torsemide  40 mg daily -renal function stable  today, Cr 4.5 -no indications to initiate renal replacement therapy as of now -Avoid nephrotoxic medications including NSAIDs and iodinated intravenous contrast exposure unless the latter is absolutely indicated.  Preferred narcotic agents for pain control are hydromorphone , fentanyl , and methadone. Morphine  should not be used. Avoid Baclofen and avoid oral sodium phosphate and magnesium  citrate based laxatives / bowel preps. Continue strict Input and Output monitoring. Will monitor the patient closely with you and intervene or adjust therapy as indicated by changes in clinical status/labs     # Nephrotic range proteinuria  - UP/cr ratio is 6220 mg/g - she is most likely nephrotic range proteinuria and CKD from her uncontrolled diabetes and thus decreased renal reserve  - serologic work-up with + ANA and SSB, 6 g proteinuria - Added PLA2R antibody (under misc [facility] test - sendout): negative - continue with torsemide  40mg  daily -s/p renal biopsy 12/15-pending read   # Metabolic acidosis - not a big issues now   # HTN  - BP stable   # Normocytic anemia  - may have had component from CKD - iron  deficiency - continue oral iron  -Aranesp  100 mcg weekly (01/10/24)  Discussed with primary service.    Subjective:    Seen in room.  S/p renal biopsy, tolerated. Still feels swollen but stable. No complaints otherwise   Objective:   BP 139/86 (BP Location: Right Arm)   Pulse 98   Temp (!) 97.4 F (36.3 C) (Oral)   Resp 18   Ht 5' 8 (1.727 m)   Wt 87.9 kg   SpO2 98%   BMI 29.46 kg/m   No intake or output data in the 24 hours ending 01/11/24 1331  Weight change:   Physical Exam: Gen:NAD, laying flat in bed HEENT: periorbital edema CVS: RRR Resp:nonlabored, normal wob Abd: soft Ext: + anasarca (trace)  Imaging: US  BIOPSY (KIDNEY) Result Date: 01/11/2024 INDICATION: Acute kidney injury in a patient with chronic kidney disease stage IIIB/4 and nephrotic range proteinuria. EXAM: Ultrasound-guided biopsy MEDICATIONS: None. ANESTHESIA/SEDATION: Moderate (conscious) sedation was employed during this procedure. A total of Versed  2 mg and Fentanyl  100 mcg was administered intravenously by the radiology nurse. Total intra-service moderate Sedation Time: 21 minutes. The patient's level of consciousness and vital signs were monitored continuously by radiology nursing throughout the procedure under my direct supervision. COMPLICATIONS: None immediate. PROCEDURE: Informed written consent was obtained from the patient after a thorough discussion of the procedural risks, benefits and alternatives. All questions were addressed. Maximal Sterile Barrier Technique was utilized including caps, mask, sterile gowns, sterile gloves, sterile drape, hand hygiene and skin antiseptic. A timeout was performed prior to the initiation of the procedure. In a prone position on the table, the flank regions were evaluated with ultrasound the left kidney presents as the optimal biopsy location. The patient's left flank was marked, prepped, and draped in usual sterile fashion. Local anesthesia was achieved with 1% lidocaine . A small incision was made patient's left flank region. An introducer needle was then advanced under ultrasound guidance to the lower pole the left kidney. The left lower pole renal parenchyma was engaged with the needle.  The stylet was then removed. A BioPince needle was then used to achieve 2 core samples which were placed in collection and sent to laboratory for further evaluation. A small  volume of Gel-Foam was then injected under ultrasound guidance. The introducer needle was then removed. Sterile dressing was applied. IMPRESSION: Satisfactory core needle biopsy of the lower pole the left kidney. Electronically Signed   By: Cordella Banner   On: 01/11/2024 13:06    Labs: BMET Recent Labs  Lab 01/05/24 9683 01/06/24 9686 01/07/24 9261 01/08/24 0434 01/09/24 0422 01/10/24 0533 01/11/24 0231  NA 138 139 136 138 139 137 138  K 4.5 4.4 5.2* 4.6 4.5 4.6 4.7  CL 109 109 106 110 109 107 109  CO2 14* 21* 21* 22 22 22  20*  GLUCOSE 274* 149* 150* 142* 148* 172* 143*  BUN 36* 36* 39* 37* 37* 36* 34*  CREATININE 3.69* 4.26* 4.04* 4.29* 4.53* 4.52* 4.50*  CALCIUM  8.2* 8.2* 8.1* 8.0* 8.0* 8.1* 8.1*  PHOS 4.7*  --  4.9* 5.0* 5.0* 5.3* 5.0*   CBC Recent Labs  Lab 01/05/24 0316 01/06/24 0313 01/09/24 0422 01/10/24 0716 01/10/24 2326  WBC 6.8 8.7 7.7 7.1 7.5  NEUTROABS 4.8 6.1  --   --  4.8  HGB 8.7* 8.2* 7.6* 7.8* 7.6*  HCT 27.1* 25.9* 23.5* 24.2* 23.2*  MCV 85.2 84.9 84.2 84.3 84.7  PLT 152 174 174 165 161    Medications:     acetaminophen   325 mg Oral Once   amLODipine   10 mg Oral Daily   atorvastatin   40 mg Oral QHS   carvedilol   12.5 mg Oral BID WC   cyclobenzaprine   10 mg Oral BID   darbepoetin (ARANESP ) injection - DIALYSIS  100 mcg Subcutaneous Q Sun-1800   fluticasone  furoate-vilanterol  1 puff Inhalation Daily   gabapentin   100 mg Oral BID   heparin  injection (subcutaneous)  5,000 Units Subcutaneous Q8H   hydrALAZINE   100 mg Oral Q8H   insulin  aspart  0-5 Units Subcutaneous QHS   insulin  aspart  0-6 Units Subcutaneous TID WC   insulin  glargine  18 Units Subcutaneous Daily   iron  polysaccharides  150 mg Oral Daily   melatonin  3 mg Oral QHS   montelukast   10 mg Oral QHS   pantoprazole   40 mg Oral Daily   sodium bicarbonate   650 mg Oral BID   torsemide   40 mg Oral Daily    Ephriam Stank, MD [facility] 01/11/2024, 1:31 PM

## 2024-01-11 NOTE — Discharge Instructions (Addendum)
 Please review your medications carefully and ask questions about how to take them if you have any concerns.  I have filled a month supply of your medications and you will need to follow up with your pcp in 1-2 weeks to discuss refills.   You will start dialysis on Monday  [facility] for a MWF, 1105am chair time. Can start Monday 12/29 at clinic. Will need to arrive 10am for 1st appt.    In a time of Crisis: Integrated family Services/Therapeutic Alternatives.  Mobile Crisis Management provides immediate crisis response, 24/7.  Call [phone] Depending on the county. [facility] for MH/DD/SA [facility] is available 24 hours a day, 7 days a week. Customer Service Specialists will assist you to find a crisis provider that is well-matched with your needs. Your local number is: [phone] [facility] Center/[facility] ([facility]) IOP, individual counseling, medication management [address] [place], [place] [address] [phone] Call for intake hours; Medicaid and Uninsured    Outpatient Providers  [facility] Offers IOP groups multiple times per week. [address] [place], [address] [phone] Takes Medicaid and other insurances.   [facility]  Chemical Dependency Intensive Outpatient Program (IOP) [address] [place], [address] [phone]  [facility] [address], [address], [place], [address] [phone]  [facility] [phone] [address] [facility] ([facility]) IOP, individual counseling, medication management [address] [place], [address] [phone] Medicaid and [facility]  [facility] [address] [place], [address] [phone] Private Insurance and Self Pay   [facility] Outpatient [address] [place], [address] [phone]    [facility]  [address], [place], [address] [phone]  [facility]  Locations in [place], [place], [place], [place].  [phone] Accepts all insurances and some uninsured.  [facility] Health Virtual Treatment [phone] Ages 28-64. Accepts most insurances and some uninsured.    Counseling Resources:  [facility] [address]  [place], [place] [address]        [phone]     [facility]  [phone] [address] [place]  [place], [place] [address] [phone]  [facility] [address] [place], [place]  [phone]    [facility] [address] [place], [place] [phone]  [facility]:  [phone] N[address] [place]  [facility]:  [address] [place], [place] [phone]  *[facility] (under & uninsured) [address]   [place] [phone]    [email or web address]    [facility] [place] [address]     Phone:  [phone] [place]-  [address]  [phone]    [facility] [address] [place], [place] [phone] ext [phone]   [facility] [place], [address], [place] [phone]):   Mon - Fri from 8 AM - 3 PM  [facility] the [address], [address] , [place]: [phone]) 8:30 - 12; 1 - 2:30 Accepts Medicaid  Specific Provider options Psychology Today  https://www.psychologytoday.com/us  click on find a therapist  enter your zip code left side and select or tailor a therapist for your specific need.   Social Support program [facility] [place] or photosolver.pl [address], [place], [place] Recovery support and educational  In home  counseling [facility] Telephone: [phone]  office in [facility] .com   private insurance [employer], [place] health Choice, [place], [place], [place], [place], [place] Health Choice   24- Hour Availability:  [facility]  [phone] or [phone]  [facility] of the [facility] [phone]  [facility] Crisis Service  [phone]   [place]  National Suicide Hotline  [phone] [place])  Call 911 or go to emergency room

## 2024-01-11 NOTE — Plan of Care (Signed)

## 2024-01-11 NOTE — Progress Notes (Signed)
 Attempted to contact pt in regard to Cologuard order. Pt's number did not prompt option to record VM. Unable to LVM or callback number.  Sara [name] Patient Navigator

## 2024-01-11 NOTE — Progress Notes (Signed)
 [facility] night cross cover note:   Per pt's request, I have ordered an additional albuterol  nebulizer treatment x 1 now.     Eva Pore, DO Hospitalist

## 2024-01-11 NOTE — Progress Notes (Signed)
 [facility] night cross cover note:   I was notified by the patient's RN that the patient is complaining of her back discomfort refractory to Tylenol , with pain noted to be at the site of recent kidney biopsy.  I subsequently added as needed IV Dilaudid  for her discomfort.     Eva Pore, DO Hospitalist

## 2024-01-11 NOTE — Procedures (Signed)
 Pre procedural Dx: AKI on CKD 3b/IV   Post procedural Dx: Same  Technically successful US  guided biopsy of kidney   EBL: None.   Complications: None immediate.   KANDICE Banner, MD Pager #: [phone]

## 2024-01-11 NOTE — Progress Notes (Signed)
 PROGRESS NOTE     Patient Demographics:    Sara [name], is a 48 y.o. female, DOB - 02-28-1975, FMW:[id number]  Outpatient Primary MD for the patient is Lenon Homer, MD    LOS - 11  Admit date - 12/30/2023    Chief Complaint  Patient presents with   Psychiatric Evaluation        Hyperglycemia   Hypertension    Pt non compliant with meds and having SI behavior issues at a group home.        Brief Narrative (HPI from H&P)     48 y.o. African-American female with medical history significant for GERD, depression, type 2 diabetes mellitus, H. pylori infection, hypertension, dyslipidemia and OSA, who presented to the emergency room with acute onset of psychiatric evaluation for suicidal ideation and hyperglycemia.  The patient admitted to polydipsia without significant polyuria.  No chest pain or palpitations.  No cough or wheezing or dyspnea.  No dysuria, oliguria or hematuria or flank pain.  In the ER was diagnosed with nonketotic hyperosmolar state from not taking her insulin  for a while, depression, suicidal ideation and she was admitted to the hospital.  She was seen by psychiatry, nephrology, anion gap has closed, she has significant noncompliance she was seen by psych who initially recommended inpatient psych admission for suicidal ideation, but she had prolonged hospital stay for which she was cleared later by psychiatry, neurology recommended biopsy which is planned for 01/11/2024.   Subjective:   Patient reports she is n.p.o. for procedure, little anxious about her biopsy.   Assessment  & Plan :   Type 2 diabetes mellitus with hyperosmolar nonketotic hyperglycemia (HCC)   - Due to noncompliance with insulin ,  was depressed and not taking it for several days, initially on IV insulin  drip now transition to subcu insulin  . 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   Anemia of chronic kidney disease. Iron  deficiency anemia -  received 1 unit of packed RBC on 01/02/2024. - Continue with oral iron   Essential hypertension  - avoid hypotension in the setting of worsening renal function - Pressure is elevated, continue with amlodipine , hydralazine , will increase her Coreg  from 6.25 to 12.5 mg twice daily - Continue with as needed hydralazine   Depression with suicidal ideation. Major depressive order, severe, recurrent - Management per psychiatry, initial recommendation for safety sitter, suicide precaution, and inpatient psych recommendation, but she has been here already for 9 days, her symptoms has improved, per most recent psych evaluation 12/12, no further side precaution or inpatient psych admission required, recommendation for close outpatient psych follow-up, so  suicide precautions/sitter has been discontinued       Condition - Fair  Family Communication  : None present at bedside, discussed with son by phone 12/11.  Code Status :  Full  Consults  :  Psych, Renal, IR  PUD Prophylaxis :  PPI  DVT Prophylaxis  :  heparin  on hold in anticipation of procedure , will resume after biopsy   Procedures  :     Renal US  -  medical renal disease      Disposition Plan  : Inpatient psych, but patient is not medically cleared yet as will need biopsy, as well as creatinine keep trending up  Status is: Inpatient       Lab Results  Component Value Date   PLT 161 01/10/2024    Diet :  Diet Order             Diet NPO time specified  Diet effective ____                    Inpatient Medications  Scheduled Meds:  acetaminophen   325 mg Oral Once   amLODipine   10 mg Oral Daily   atorvastatin   40 mg Oral QHS   carvedilol   12.5 mg Oral BID WC   cyclobenzaprine   10 mg Oral BID   darbepoetin (ARANESP ) injection - DIALYSIS  100 mcg Subcutaneous Q Sun-1800   fluticasone  furoate-vilanterol  1 puff Inhalation Daily   gabapentin   100 mg Oral BID   hydrALAZINE   100 mg Oral Q8H   insulin  aspart  0-5 Units Subcutaneous QHS   insulin  aspart  0-6 Units Subcutaneous TID WC   insulin  glargine  18 Units Subcutaneous Daily   iron  polysaccharides  150 mg Oral Daily   melatonin  3 mg Oral QHS   montelukast   10 mg Oral QHS   pantoprazole   40 mg Oral Daily   sodium bicarbonate   650 mg Oral BID   torsemide   40 mg Oral Daily   Continuous Infusions:  PRN Meds:.acetaminophen  **OR** acetaminophen , albuterol , cyclobenzaprine , dextrose , hydrALAZINE , ondansetron  **OR** ondansetron  (ZOFRAN ) IV, senna-docusate, sodium chloride , traZODone   Antibiotics  :    Anti-infectives (From admission, onward)    None         Objective:   Vitals:   01/11/24 0420 01/11/24 0445 01/11/24 0736 01/11/24 0752  BP:   (!) 141/78   Pulse:   (!) 115    Resp:      Temp:   98.6 F (37 C)   TempSrc:   Oral   SpO2: 100% 98% 100% 99%  Weight:      Height:        Wt Readings from Last 3 Encounters:  01/07/24 87.9 kg  10/07/23 64.5 kg  04/08/23 74.8 kg    No intake or output data in the 24 hours ending 01/11/24 0912    Physical Exam  Awake, alert, no apparent distress  Good air entry  Abdomen soft  +1 edema        Data Review:    Recent Labs  Lab 01/05/24 0316 01/06/24 0313 01/09/24 0422 01/10/24 0716 01/10/24 2326  WBC 6.8 8.7 7.7 7.1 7.5  HGB 8.7* 8.2* 7.6* 7.8* 7.6*  HCT 27.1* 25.9* 23.5* 24.2* 23.2*  PLT 152 174 174 165 161  MCV 85.2 84.9 84.2 84.3 84.7  MCH 27.4 26.9 27.2 27.2 27.7  MCHC 32.1 31.7 32.3 32.2 32.8  RDW 14.2 14.1 13.6 13.7 13.5  LYMPHSABS 1.3 1.5  --   --  1.9  MONOABS 0.6 0.9  --   --  0.7  EOSABS 0.1 0.1  --   --  0.1  BASOSABS 0.0 0.0  --   --  0.0    Recent Labs  Lab 01/05/24 0316 01/06/24 0313 01/07/24 0738 01/08/24 0434 01/09/24 0422 01/10/24 0533 01/11/24 0231  NA 138   < > 136 138 139 137 138  K 4.5   < > 5.2* 4.6 4.5 4.6 4.7  CL 109   < > 106 110 109 107 109  CO2 14*   < > 21* 22 22 22  20*  ANIONGAP 15   < > 9 6 8 8 9   GLUCOSE 274*   < > 150* 142* 148* 172* 143*  BUN 36*   < > 39* 37* 37* 36* 34*  CREATININE 3.69*   < > 4.04* 4.29* 4.53* 4.52* 4.50*  ALBUMIN   --   --  2.2* 2.0* 2.3* 2.2* 2.3*  INR  --   --   --  0.9  --   --   --   MG 2.1  --   --   --   --   --   --   PHOS 4.7*  --  4.9* 5.0* 5.0* 5.3* 5.0*  CALCIUM  8.2*   < > 8.1* 8.0* 8.0* 8.1* 8.1*   < > = values in this interval not displayed.      Recent Labs  Lab 01/05/24 0316 01/06/24 0313 01/07/24 0738 01/08/24 0434 01/09/24 0422 01/10/24 0533 01/11/24 0231  INR  --   --   --  0.9  --   --   --   MG 2.1  --   --   --   --   --   --   CALCIUM  8.2*   < > 8.1* 8.0* 8.0* 8.1* 8.1*   < > = values in this interval not displayed.     --------------------------------------------------------------------------------------------------------------- Lab Results  Component Value Date   CHOL 215 (  H) 01/10/2023   HDL 51 01/10/2023   LDLCALC 148 (H) 01/10/2023   TRIG 79 01/10/2023   CHOLHDL 4.2 01/10/2023    Lab Results  Component Value Date   HGBA1C 8.6 (H) 10/06/2023   No results for input(s): TSH, T4TOTAL, FREET4, T3FREE, THYROIDAB in the last 72 hours. No results for input(s): VITAMINB12, FOLATE, FERRITIN, TIBC, IRON , RETICCTPCT in the last 72 hours.  ------------------------------------------------------------------------------------------------------------------ Cardiac Enzymes No results for input(s): CKMB, TROPONINI, MYOGLOBIN in the last 168 hours.  Invalid input(s): CK  Micro Results No results found for this or any previous visit (from the past 240 hours).  Radiology Report No results found.    Signature  -   Brayton Lye M.D on 01/11/2024 at 9:12 AM   -  To page go to [email or web address]

## 2024-01-12 LAB — GLUCOSE, CAPILLARY
Glucose-Capillary: 108 mg/dL — ABNORMAL HIGH (ref 70–99)
Glucose-Capillary: 84 mg/dL (ref 70–99)
Glucose-Capillary: 176 mg/dL — ABNORMAL HIGH (ref 70–99)
Glucose-Capillary: 81 mg/dL (ref 70–99)

## 2024-01-12 LAB — CBC
HCT: 26.6 % — ABNORMAL LOW (ref 36.0–46.0)
Hemoglobin: 8.2 g/dL — ABNORMAL LOW (ref 12.0–15.0)
MCH: 26.5 pg (ref 26.0–34.0)
MCHC: 30.8 g/dL (ref 30.0–36.0)
MCV: 85.8 fL (ref 80.0–100.0)
Platelets: 128 K/uL — ABNORMAL LOW (ref 150–400)
RBC: 3.1 MIL/uL — ABNORMAL LOW (ref 3.87–5.11)
RDW: 13.5 % (ref 11.5–15.5)
WBC: 8.3 K/uL (ref 4.0–10.5)
nRBC: 0.2 % (ref 0.0–0.2)

## 2024-01-12 LAB — BASIC METABOLIC PANEL WITH GFR
Anion gap: 12 (ref 5–15)
BUN: 36 mg/dL — ABNORMAL HIGH (ref 6–20)
CO2: 19 mmol/L — ABNORMAL LOW (ref 22–32)
Calcium: 8.3 mg/dL — ABNORMAL LOW (ref 8.9–10.3)
Chloride: 111 mmol/L (ref 98–111)
Creatinine, Ser: 4.47 mg/dL — ABNORMAL HIGH (ref 0.44–1.00)
GFR, Estimated: 11 mL/min — ABNORMAL LOW (ref >60)
Glucose, Bld: 118 mg/dL — ABNORMAL HIGH (ref 70–99)
Potassium: 4.8 mmol/L (ref 3.5–5.1)
Sodium: 142 mmol/L (ref 135–145)

## 2024-01-12 MED ORDER — ASPIRIN 81 MG PO TBEC
81.0000 mg | DELAYED_RELEASE_TABLET | Freq: Every day | ORAL | Status: AC
  Administered 2024-01-13 – 2024-01-22 (×10): 81 mg via ORAL
  Filled 2024-01-12 (×10): qty 1

## 2024-01-12 NOTE — Progress Notes (Incomplete)
 PROGRESS NOTE        PATIENT DETAILS Name: Sara [name] Age: 48 y.o. Sex: female Date of Birth: 08-15-1975 Admit Date: 12/30/2023 Admitting Physician Madison DELENA Peaches, MD ERE:[email or web address], Ronalee, MD  Brief Summary: Patient is a 48 y.o.  female with history of DM-2-who initially presented to the hospital with suicidal evaluation and hyperosmolar nonketotic hyperglycemia.  Hospital course complicated by AKI-s/p renal biopsy 12/15.  Significant events: 12/4>> admit to [facility] 12/15>> renal biopsy  Significant studies: 12/5>> renal ultrasound: No hydronephrosis  Significant microbiology data: None  Procedures: 12/15>> renal biopsy  Consults: Nephrology Psychiatry Interventional radiology  Subjective: Lying comfortably in bed-denies any chest pain or shortness of breath.  Objective: Vitals: Blood pressure 125/71, pulse (!) 119, temperature 99 F (37.2 C), temperature source Oral, resp. rate 18, height 5' 8 (1.727 m), weight 89.2 kg, SpO2 100%.   Exam: Gen Exam:Alert awake-not in any distress HEENT:atraumatic, normocephalic Chest: B/L clear to auscultation anteriorly CVS:S1S2 regular Abdomen:soft non tender, non distended Extremities:no edema Neurology: Non focal Skin: no rash  Pertinent Labs/Radiology:    Latest Ref Rng & Units 01/12/2024    3:21 AM 01/10/2024   11:26 PM 01/10/2024    7:16 AM  CBC  WBC 4.0 - 10.5 K/uL 8.3  7.5  7.1   Hemoglobin 12.0 - 15.0 g/dL 8.2  7.6  7.8   Hematocrit 36.0 - 46.0 % 26.6  23.2  24.2   Platelets 150 - 400 K/uL 128  161  165     Lab Results  Component Value Date   NA 142 01/12/2024   K 4.8 01/12/2024   CL 111 01/12/2024   CO2 19 (L) 01/12/2024      Assessment/Plan: Hyperosmolar nonketotic hyperglycemia Resolved with IVF/IV fluids  DM-2 with uncontrolled hyperglycemia (A1c 8.6 on 9/9) CBG stable Lantus  18 units daily+ SSI  AKI on CKD stage IIIb 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  Multifactorial anemia Secondary to underlying CKD/critical illness/iron  deficiency Continued iron  supplementation S/p 1 unit of PRBC on 12/6 Follow CBC periodically  Hyperkalemia Resolved with Lokelma   HTN BP relatively stable Continue amlodipine /hydralazine /Coreg   HLD Statin  GERD PPI  Suicidal ideation Recurrent major depressive disorder Evaluated by psychiatry-initially inpatient psychiatry was recommended-however due to prolonged hospitalization-her symptoms have improved-psychiatry no longer recommending inpatient admission-bedside sitter has been discontinued Continue trazodone  for sleep Needs outpatient follow-up with psychiatry  Code status:   Code Status: Full Code   DVT Prophylaxis: heparin  injection 5,000 Units Start: 01/11/24 2200   Family Communication: None at bedside   Disposition Plan: Status is: Inpatient Remains inpatient appropriate because: Severity of illness   Planned Discharge Destination:Home   Diet: Diet Order             Diet regular Room service appropriate? Yes; Fluid consistency: Thin  Diet effective now                     Antimicrobial agents: Anti-infectives (From admission, onward)    None        MEDICATIONS: Scheduled Meds:  acetaminophen   325 mg Oral Once   amLODipine   10 mg Oral Daily   [START ON 01/13/2024] aspirin  EC  81 mg Oral Daily   atorvastatin   40 mg Oral QHS   carvedilol   12.5 mg Oral BID WC   cyclobenzaprine   10 mg Oral BID   darbepoetin (ARANESP ) injection - DIALYSIS  100 mcg Subcutaneous Q Sun-1800   fluticasone  furoate-vilanterol  1 puff Inhalation Daily   gabapentin   100 mg Oral BID   heparin  injection (subcutaneous)  5,000 Units Subcutaneous Q8H   hydrALAZINE   100 mg Oral Q8H   insulin  aspart  0-5 Units  Subcutaneous QHS   insulin  aspart  0-6 Units Subcutaneous TID WC   insulin  glargine  18 Units Subcutaneous Daily   iron  polysaccharides  150 mg Oral Daily   melatonin  3 mg Oral QHS   montelukast   10 mg Oral QHS   pantoprazole   40 mg Oral Daily   sodium bicarbonate   650 mg Oral BID   torsemide   40 mg Oral Daily   Continuous Infusions: PRN Meds:.acetaminophen  **OR** acetaminophen , albuterol , cyclobenzaprine , dextrose , hydrALAZINE , HYDROmorphone  (DILAUDID ) injection, ondansetron  **OR** ondansetron  (ZOFRAN ) IV, senna-docusate, sodium chloride , traZODone    I have personally reviewed following labs and imaging studies  LABORATORY DATA: CBC: Recent Labs  Lab 01/06/24 0313 01/09/24 0422 01/10/24 0716 01/10/24 2326 01/12/24 0321  WBC 8.7 7.7 7.1 7.5 8.3  NEUTROABS 6.1  --   --  4.8  --   HGB 8.2* 7.6* 7.8* 7.6* 8.2*  HCT 25.9* 23.5* 24.2* 23.2* 26.6*  MCV 84.9 84.2 84.3 84.7 85.8  PLT 174 174 165 161 128*    Basic Metabolic Panel: Recent Labs  Lab 01/07/24 0738 01/08/24 0434 01/09/24 0422 01/10/24 0533 01/11/24 0231 01/12/24 0321  NA 136 138 139 137 138 142  K 5.2* 4.6 4.5 4.6 4.7 4.8  CL 106 110 109 107 109 111  CO2 21* 22 22 22  20* 19*  GLUCOSE 150* 142* 148* 172* 143* 118*  BUN 39* 37* 37* 36* 34* 36*  CREATININE 4.04* 4.29* 4.53* 4.52* 4.50* 4.47*  CALCIUM  8.1* 8.0* 8.0* 8.1* 8.1* 8.3*  PHOS 4.9* 5.0* 5.0* 5.3* 5.0*  --     GFR: Estimated Creatinine Clearance: 17.8 mL/min (A) (by C-G formula based on SCr of 4.47 mg/dL (H)).  Liver Function Tests: Recent Labs  Lab 01/07/24 0738 01/08/24 0434 01/09/24 0422 01/10/24 0533 01/11/24 0231  ALBUMIN  2.2* 2.0* 2.3* 2.2* 2.3*   No results for input(s): LIPASE, AMYLASE in the last 168 hours. No results for input(s): AMMONIA in the last 168 hours.  Coagulation Profile: Recent Labs  Lab 01/08/24 0434  INR 0.9    Cardiac Enzymes: No results for input(s): CKTOTAL, CKMB, CKMBINDEX, TROPONINI in  the last 168 hours.  BNP (last 3 results) No results for input(s): PROBNP in the last 8760 hours.  Lipid Profile: No results for input(s): CHOL, HDL, LDLCALC, TRIG, CHOLHDL, LDLDIRECT in the last 72 hours.  Thyroid Function Tests: No results for input(s): TSH, T4TOTAL, FREET4, T3FREE, THYROIDAB in the last 72 hours.  Anemia Panel: No results for input(s): VITAMINB12, FOLATE, FERRITIN, TIBC, IRON , RETICCTPCT in the last 72 hours.  Urine analysis:    Component Value Date/Time   COLORURINE YELLOW 01/01/2024 0943   APPEARANCEUR HAZY (A) 01/01/2024 0943   LABSPEC 1.011 01/01/2024 0943   PHURINE 6.0 01/01/2024 0943   GLUCOSEU >=500 (A) 01/01/2024 0943   HGBUR MODERATE (A) 01/01/2024 0943   BILIRUBINUR NEGATIVE 01/01/2024 0943   KETONESUR NEGATIVE 01/01/2024 0943   PROTEINUR >=300 (A) 01/01/2024 0943   UROBILINOGEN 0.2 12/11/2009 1249   NITRITE NEGATIVE 01/01/2024 0943   LEUKOCYTESUR NEGATIVE 01/01/2024 9056  Sepsis Labs: Lactic Acid, Venous No results found for: LATICACIDVEN  MICROBIOLOGY: No results found for this or any previous visit (from the past 240 hours).  RADIOLOGY STUDIES/RESULTS: US  BIOPSY (KIDNEY) Result Date: 01/11/2024 INDICATION: Acute kidney injury in a patient with chronic kidney disease stage IIIB/4 and nephrotic range proteinuria. EXAM: Ultrasound-guided biopsy MEDICATIONS: None. ANESTHESIA/SEDATION: Moderate (conscious) sedation was employed during this procedure. A total of Versed  2 mg and Fentanyl  100 mcg was administered intravenously by the radiology nurse. Total intra-service moderate Sedation Time: 21 minutes. The patient's level of consciousness and vital signs were monitored continuously by radiology nursing throughout the procedure under my direct supervision. COMPLICATIONS: None immediate. PROCEDURE: Informed written consent was obtained from the patient after a thorough discussion of the procedural risks,  benefits and alternatives. All questions were addressed. Maximal Sterile Barrier Technique was utilized including caps, mask, sterile gowns, sterile gloves, sterile drape, hand hygiene and skin antiseptic. A timeout was performed prior to the initiation of the procedure. In a prone position on the table, the flank regions were evaluated with ultrasound the left kidney presents as the optimal biopsy location. The patient's left flank was marked, prepped, and draped in usual sterile fashion. Local anesthesia was achieved with 1% lidocaine . A small incision was made patient's left flank region. An introducer needle was then advanced under ultrasound guidance to the lower pole the left kidney. The left lower pole renal parenchyma was engaged with the needle. The stylet was then removed. A BioPince needle was then used to achieve 2 core samples which were placed in collection and sent to laboratory for further evaluation. A small volume of Gel-Foam was then injected under ultrasound guidance. The introducer needle was then removed. Sterile dressing was applied. IMPRESSION: Satisfactory core needle biopsy of the lower pole the left kidney. Electronically Signed   By: Cordella Banner   On: 01/11/2024 13:06     LOS: 12 days   Donalda Applebaum, MD  [facility]    To contact the attending provider between 7A-7P or the covering provider during after hours 7P-7A, please log into the web site [email or web address] and access using universal [place] password for that web site. If you do not have the password, please call the hospital operator.  01/12/2024, 5:49 PM

## 2024-01-12 NOTE — Progress Notes (Signed)
 Patient has been homeless, but declined wanting to find another place to live, denied the need for housing resources

## 2024-01-12 NOTE — Plan of Care (Signed)

## 2024-01-12 NOTE — Progress Notes (Addendum)
 PROGRESS NOTE     Patient Demographics:    Sara [name], is a 48 y.o. female, DOB - [birth year], FMW:[id number]  Outpatient Primary MD for the patient is Lenon Homer, MD    LOS - 12  Admit date - 12/30/2023    Chief Complaint  Patient presents with   Psychiatric Evaluation        Hyperglycemia   Hypertension    Pt non compliant with meds and having SI behavior issues at a group home.        Brief Narrative (HPI from H&P)     48 y.o. African-American female with medical history significant for GERD, depression, type 2 diabetes mellitus, H. pylori infection, hypertension, dyslipidemia and OSA, who presented to the emergency room with acute onset of psychiatric evaluation for suicidal ideation and hyperglycemia.  The patient admitted to polydipsia without significant polyuria.  No chest pain or palpitations.  No cough or wheezing or dyspnea.  No dysuria, oliguria or hematuria or flank pain.  In the ER was diagnosed with nonketotic hyperosmolar state from not taking her insulin  for a while, depression, suicidal ideation and she was admitted to the hospital.  She was seen by psychiatry, nephrology, anion gap has closed, she has significant noncompliance she was seen by psych who initially recommended inpatient psych admission for suicidal ideation, but she had prolonged hospital stay for which she was cleared later by psychiatry, neurology recommended biopsy which is planned for 01/11/2024.   Subjective:   Patient reports nausea over the last 24 hours, and poor oral intake.   Assessment  & Plan :   Type 2 diabetes mellitus with hyperosmolar nonketotic hyperglycemia (HCC)   - Due to noncompliance with insulin , was  depressed and not taking it for several days, initially on IV insulin  drip now transition to subcu insulin  . - Adjust dose cautiously as intermittently refusing her dose, to avoid hypoglycemia, but she has been more compliant with her insulin  recently, so dose has been adjusted, CBG controlled on current 18 units , continue with insulin  sliding scale    Lab Results  Component Value  Date   HGBA1C 8.6 (H) 10/06/2023   CBG (last 3)  Recent Labs    01/11/24 1637 01/11/24 2044 01/12/24 0815  GLUCAP 217* 119* 108*      Acute kidney injury superimposed on CKD stage IIIb  Nephrotic range proteinuria  Microscopic hematuria - Management per renal, plan for renal biopsy on Monday after aspirin  washout -Continue with bicarb - Continue to hold lisinopril  - Monitor as on torsemide  - serologic work-up with + ANA and SSB, 6 g proteinuria  - Renal biopsy after aspirin  washout, aspirin  held for 5 days, and biopsy obtained 12/15, results still pending - Decreased gabapentin  due to worsening renal function  GERD without esophagitis  - PPI  Hyperkalemia - Resolved with Lokelma , monitor closely  Dyslipidemia  - Will continue statin therapy.   Anemia of chronic kidney disease. Iron  deficiency anemia -  received 1 unit of packed RBC on 01/02/2024. - Continue with oral iron   Essential hypertension  - avoid hypotension in the setting of worsening renal function - Pressure is elevated, continue with amlodipine , hydralazine , increased her Coreg  from 6.25 to 12.5 mg twice daily - Continue with as needed hydralazine   Depression with suicidal ideation. Major depressive order, severe, recurrent - Management per psychiatry, initial recommendation for safety sitter, suicide precaution, and inpatient psych recommendation, but she has been here already for 9 days, her symptoms has improved, per most recent psych evaluation 12/12, no further side precaution or inpatient psych admission required,  recommendation for close outpatient psych follow-up, so suicide precautions/sitter has been discontinued       Condition - Fair  Family Communication  : None present at bedside  Code Status :  Full  Consults  :  Psych, Renal, IR  PUD Prophylaxis :  PPI  DVT Prophylaxis  : [place] heparin     Procedures  :     Renal US  -  medical renal disease      Disposition Plan  : Plan to go to shelter, once function stabilizes and cleared by nephrology  Status is: Inpatient    heparin  injection 5,000 Units Start: 01/11/24 2200   Lab Results  Component Value Date   PLT 128 (L) 01/12/2024    Diet :  Diet Order             Diet regular Room service appropriate? Yes; Fluid consistency: Thin  Diet effective now                    Inpatient Medications  Scheduled Meds:  acetaminophen   325 mg Oral Once   amLODipine   10 mg Oral Daily   atorvastatin   40 mg Oral QHS   carvedilol   12.5 mg Oral BID WC   cyclobenzaprine   10 mg Oral BID   darbepoetin (ARANESP ) injection - DIALYSIS  100 mcg Subcutaneous Q Sun-1800   fluticasone  furoate-vilanterol  1 puff Inhalation Daily   gabapentin   100 mg Oral BID   heparin  injection (subcutaneous)  5,000 Units Subcutaneous Q8H   hydrALAZINE   100 mg Oral Q8H   insulin  aspart  0-5 Units Subcutaneous QHS   insulin  aspart  0-6 Units Subcutaneous TID WC   insulin  glargine  18 Units Subcutaneous Daily   iron  polysaccharides  150 mg Oral Daily   melatonin  3 mg Oral QHS   montelukast   10 mg Oral QHS   pantoprazole   40 mg Oral Daily   sodium bicarbonate   650 mg Oral BID   torsemide   40 mg Oral Daily  Continuous Infusions:    PRN Meds:.acetaminophen  **OR** acetaminophen , albuterol , cyclobenzaprine , dextrose , hydrALAZINE , HYDROmorphone  (DILAUDID ) injection, ondansetron  **OR** ondansetron  (ZOFRAN ) IV, senna-docusate, sodium chloride , traZODone   Antibiotics  :    Anti-infectives (From admission, onward)    None         Objective:    Vitals:   01/12/24 0330 01/12/24 0500 01/12/24 0514 01/12/24 0815  BP: (!) 152/83  (!) 144/92 135/82  Pulse: (!) 119  (!) 108 (!) 110  Resp: 16   18  Temp: (!) 97.5 F (36.4 C)   98.6 F (37 C)  TempSrc: Oral   Oral  SpO2:      Weight:  89.2 kg    Height:        Wt Readings from Last 3 Encounters:  01/12/24 89.2 kg  10/07/23 64.5 kg  04/08/23 74.8 kg     Intake/Output Summary (Last 24 hours) at 01/12/2024 1129 Last data filed at 01/11/2024 2100 Gross per 24 hour  Intake 240 ml  Output --  Net 240 ml      Physical Exam  Awake, alert, no apparent distress Good air entry Abdomen soft Extremity with +1 edema at baseline      Data Review:    Recent Labs  Lab 01/06/24 0313 01/09/24 0422 01/10/24 0716 01/10/24 2326 01/12/24 0321  WBC 8.7 7.7 7.1 7.5 8.3  HGB 8.2* 7.6* 7.8* 7.6* 8.2*  HCT 25.9* 23.5* 24.2* 23.2* 26.6*  PLT 174 174 165 161 128*  MCV 84.9 84.2 84.3 84.7 85.8  MCH 26.9 27.2 27.2 27.7 26.5  MCHC 31.7 32.3 32.2 32.8 30.8  RDW 14.1 13.6 13.7 13.5 13.5  LYMPHSABS 1.5  --   --  1.9  --   MONOABS 0.9  --   --  0.7  --   EOSABS 0.1  --   --  0.1  --   BASOSABS 0.0  --   --  0.0  --     Recent Labs  Lab 01/07/24 0738 01/08/24 0434 01/09/24 0422 01/10/24 0533 01/11/24 0231 01/12/24 0321  NA 136 138 139 137 138 142  K 5.2* 4.6 4.5 4.6 4.7 4.8  CL 106 110 109 107 109 111  CO2 21* 22 22 22  20* 19*  ANIONGAP 9 6 8 8 9 12   GLUCOSE 150* 142* 148* 172* 143* 118*  BUN 39* 37* 37* 36* 34* 36*  CREATININE 4.04* 4.29* 4.53* 4.52* 4.50* 4.47*  ALBUMIN  2.2* 2.0* 2.3* 2.2* 2.3*  --   INR  --  0.9  --   --   --   --   PHOS 4.9* 5.0* 5.0* 5.3* 5.0*  --   CALCIUM  8.1* 8.0* 8.0* 8.1* 8.1* 8.3*      Recent Labs  Lab 01/08/24 0434 01/09/24 0422 01/10/24 0533 01/11/24 0231 01/12/24 0321  INR 0.9  --   --   --   --   CALCIUM  8.0* 8.0* 8.1* 8.1* 8.3*     --------------------------------------------------------------------------------------------------------------- Lab Results  Component Value Date   CHOL 215 (H) 01/10/2023   HDL 51 01/10/2023   LDLCALC 148 (H) 01/10/2023   TRIG 79 01/10/2023   CHOLHDL 4.2 01/10/2023    Lab Results  Component Value Date   HGBA1C 8.6 (H) 10/06/2023   No results for input(s): TSH, T4TOTAL, FREET4, T3FREE, THYROIDAB in the last 72 hours. No results for input(s): VITAMINB12, FOLATE, FERRITIN, TIBC, IRON , RETICCTPCT in the last 72 hours.  ------------------------------------------------------------------------------------------------------------------ Cardiac Enzymes No results for input(s): CKMB, TROPONINI, MYOGLOBIN in  the last 168 hours.  Invalid input(s): CK  Micro Results No results found for this or any previous visit (from the past 240 hours).  Radiology Report US  BIOPSY (KIDNEY) Result Date: 01/11/2024 INDICATION: Acute kidney injury in a patient with chronic kidney disease stage IIIB/4 and nephrotic range proteinuria. EXAM: Ultrasound-guided biopsy MEDICATIONS: None. ANESTHESIA/SEDATION: Moderate (conscious) sedation was employed during this procedure. A total of Versed  2 mg and Fentanyl  100 mcg was administered intravenously by the radiology nurse. Total intra-service moderate Sedation Time: 21 minutes. The patient's level of consciousness and vital signs were monitored continuously by radiology nursing throughout the procedure under my direct supervision. COMPLICATIONS: None immediate. PROCEDURE: Informed written consent was obtained from the patient after a thorough discussion of the procedural risks, benefits and alternatives. All questions were addressed. Maximal Sterile Barrier Technique was utilized including caps, mask, sterile gowns, sterile gloves, sterile drape, hand hygiene and skin antiseptic. A timeout was performed prior to the initiation of the  procedure. In a prone position on the table, the flank regions were evaluated with ultrasound the left kidney presents as the optimal biopsy location. The patient's left flank was marked, prepped, and draped in usual sterile fashion. Local anesthesia was achieved with 1% lidocaine . A small incision was made patient's left flank region. An introducer needle was then advanced under ultrasound guidance to the lower pole the left kidney. The left lower pole renal parenchyma was engaged with the needle. The stylet was then removed. A BioPince needle was then used to achieve 2 core samples which were placed in collection and sent to laboratory for further evaluation. A small volume of Gel-Foam was then injected under ultrasound guidance. The introducer needle was then removed. Sterile dressing was applied. IMPRESSION: Satisfactory core needle biopsy of the lower pole the left kidney. Electronically Signed   By: Cordella Banner   On: 01/11/2024 13:06      Signature  -   Brayton Lye M.D on 01/12/2024 at 11:29 AM   -  To page go to [email or web address]

## 2024-01-12 NOTE — Progress Notes (Signed)
 [facility] Progress Note   Assessment/ Plan:   # AKI on CKD 3b/IV - Most likely secondary to uncontrolled DM and nonketotic hyperosmolar state from insulin  noncompliance.  Also note nephrotic range proteinuria, microscopic hematuria, and anasarca - concerning for possible GN - Continue supportive care  - hold lisinopril  - continue with torsemide  40 mg daily -renal function stable  today, Cr 4.47 -suspect early uremic symptoms given nausea/low appetite/dysgeusia. We did discuss the possibility of her needing to start dialysis while she's here. We agreed to wait until her biopsy report to make the next decision -Avoid nephrotoxic medications including NSAIDs and iodinated intravenous contrast exposure unless the latter is absolutely indicated.  Preferred narcotic agents for pain control are hydromorphone , fentanyl , and methadone. Morphine  should not be used. Avoid Baclofen and avoid oral sodium phosphate and magnesium  citrate based laxatives / bowel preps. Continue strict Input and Output monitoring. Will monitor the patient closely with you and intervene or adjust therapy as indicated by changes in clinical status/labs     # Nephrotic range proteinuria  - UP/cr ratio is 6220 mg/g - she is most likely nephrotic range proteinuria and CKD from her uncontrolled diabetes and thus decreased renal reserve  - serologic work-up with + ANA and SSB, 6 g proteinuria - Added PLA2R antibody (under misc [facility] test - sendout): negative - continue with torsemide  40mg  daily -s/p renal biopsy 12/15-pending read   # Metabolic acidosis - stable   # HTN  - BP stable   # Normocytic anemia  - may have had component from CKD - iron  deficiency - continue oral iron  -Aranesp  100 mcg weekly (01/10/24) -hgb up to 8.2 today     Subjective:    Seen in room.  Patient reports that she started having nausea and poor appetite within the last day. Associated with salty taste in mouth. She does  report it seems that her swelling is slightly better.   Objective:   BP 135/82   Pulse (!) 110   Temp 98.6 F (37 C) (Oral)   Resp 18   Ht 5' 8 (1.727 m)   Wt 89.2 kg   SpO2 100%   BMI 29.90 kg/m   Intake/Output Summary (Last 24 hours) at 01/12/2024 1203 Last data filed at 01/11/2024 2100 Gross per 24 hour  Intake 240 ml  Output --  Net 240 ml    Weight change:   Physical Exam: Gen:NAD, sitting in recliner HEENT: periorbital edema CVS: RRR Resp:nonlabored, normal wob Abd: soft Ext: + anasarca (trace) Neuro: awake, alert, no myoclonic jerks observed  Imaging: US  BIOPSY (KIDNEY) Result Date: 01/11/2024 INDICATION: Acute kidney injury in a patient with chronic kidney disease stage IIIB/4 and nephrotic range proteinuria. EXAM: Ultrasound-guided biopsy MEDICATIONS: None. ANESTHESIA/SEDATION: Moderate (conscious) sedation was employed during this procedure. A total of Versed  2 mg and Fentanyl  100 mcg was administered intravenously by the radiology nurse. Total intra-service moderate Sedation Time: 21 minutes. The patient's level of consciousness and vital signs were monitored continuously by radiology nursing throughout the procedure under my direct supervision. COMPLICATIONS: None immediate. PROCEDURE: Informed written consent was obtained from the patient after a thorough discussion of the procedural risks, benefits and alternatives. All questions were addressed. Maximal Sterile Barrier Technique was utilized including caps, mask, sterile gowns, sterile gloves, sterile drape, hand hygiene and skin antiseptic. A timeout was performed prior to the initiation of the procedure. In a prone position on the table, the flank regions were evaluated with ultrasound the left kidney  presents as the optimal biopsy location. The patient's left flank was marked, prepped, and draped in usual sterile fashion. Local anesthesia was achieved with 1% lidocaine . A small incision was made patient's left  flank region. An introducer needle was then advanced under ultrasound guidance to the lower pole the left kidney. The left lower pole renal parenchyma was engaged with the needle. The stylet was then removed. A BioPince needle was then used to achieve 2 core samples which were placed in collection and sent to laboratory for further evaluation. A small volume of Gel-Foam was then injected under ultrasound guidance. The introducer needle was then removed. Sterile dressing was applied. IMPRESSION: Satisfactory core needle biopsy of the lower pole the left kidney. Electronically Signed   By: Cordella Banner   On: 01/11/2024 13:06    Labs: BMET Recent Labs  Lab 01/06/24 9686 01/07/24 9261 01/08/24 0434 01/09/24 0422 01/10/24 0533 01/11/24 0231 01/12/24 0321  NA 139 136 138 139 137 138 142  K 4.4 5.2* 4.6 4.5 4.6 4.7 4.8  CL 109 106 110 109 107 109 111  CO2 21* 21* 22 22 22  20* 19*  GLUCOSE 149* 150* 142* 148* 172* 143* 118*  BUN 36* 39* 37* 37* 36* 34* 36*  CREATININE 4.26* 4.04* 4.29* 4.53* 4.52* 4.50* 4.47*  CALCIUM  8.2* 8.1* 8.0* 8.0* 8.1* 8.1* 8.3*  PHOS  --  4.9* 5.0* 5.0* 5.3* 5.0*  --    CBC Recent Labs  Lab 01/06/24 0313 01/09/24 0422 01/10/24 0716 01/10/24 2326 01/12/24 0321  WBC 8.7 7.7 7.1 7.5 8.3  NEUTROABS 6.1  --   --  4.8  --   HGB 8.2* 7.6* 7.8* 7.6* 8.2*  HCT 25.9* 23.5* 24.2* 23.2* 26.6*  MCV 84.9 84.2 84.3 84.7 85.8  PLT 174 174 165 161 128*    Medications:     acetaminophen   325 mg Oral Once   amLODipine   10 mg Oral Daily   [START ON 01/13/2024] aspirin  EC  81 mg Oral Daily   atorvastatin   40 mg Oral QHS   carvedilol   12.5 mg Oral BID WC   cyclobenzaprine   10 mg Oral BID   darbepoetin (ARANESP ) injection - DIALYSIS  100 mcg Subcutaneous Q Sun-1800   fluticasone  furoate-vilanterol  1 puff Inhalation Daily   gabapentin   100 mg Oral BID   heparin  injection (subcutaneous)  5,000 Units Subcutaneous Q8H   hydrALAZINE   100 mg Oral Q8H   insulin  aspart   0-5 Units Subcutaneous QHS   insulin  aspart  0-6 Units Subcutaneous TID WC   insulin  glargine  18 Units Subcutaneous Daily   iron  polysaccharides  150 mg Oral Daily   melatonin  3 mg Oral QHS   montelukast   10 mg Oral QHS   pantoprazole   40 mg Oral Daily   sodium bicarbonate   650 mg Oral BID   torsemide   40 mg Oral Daily    Ephriam Stank, MD [facility] 01/12/2024, 12:03 PM

## 2024-01-13 DIAGNOSIS — N179 Acute kidney failure, unspecified: Secondary | ICD-10-CM | POA: Diagnosis not present

## 2024-01-13 DIAGNOSIS — I10 Essential (primary) hypertension: Secondary | ICD-10-CM | POA: Diagnosis not present

## 2024-01-13 DIAGNOSIS — E1100 Type 2 diabetes mellitus with hyperosmolarity without nonketotic hyperglycemic-hyperosmolar coma (NKHHC): Secondary | ICD-10-CM | POA: Diagnosis not present

## 2024-01-13 DIAGNOSIS — E785 Hyperlipidemia, unspecified: Secondary | ICD-10-CM | POA: Diagnosis not present

## 2024-01-13 DIAGNOSIS — N189 Chronic kidney disease, unspecified: Secondary | ICD-10-CM | POA: Diagnosis not present

## 2024-01-13 LAB — CBC
HCT: 23.7 % — ABNORMAL LOW (ref 36.0–46.0)
Hemoglobin: 7.5 g/dL — ABNORMAL LOW (ref 12.0–15.0)
MCH: 26.8 pg (ref 26.0–34.0)
MCHC: 31.6 g/dL (ref 30.0–36.0)
MCV: 84.6 fL (ref 80.0–100.0)
Platelets: 192 K/uL (ref 150–400)
RBC: 2.8 MIL/uL — ABNORMAL LOW (ref 3.87–5.11)
RDW: 13.3 % (ref 11.5–15.5)
WBC: 7 K/uL (ref 4.0–10.5)
nRBC: 0 % (ref 0.0–0.2)

## 2024-01-13 LAB — RENAL FUNCTION PANEL
Albumin: 2.8 g/dL — ABNORMAL LOW (ref 3.5–5.0)
Anion gap: 8 (ref 5–15)
BUN: 34 mg/dL — ABNORMAL HIGH (ref 6–20)
CO2: 21 mmol/L — ABNORMAL LOW (ref 22–32)
Calcium: 8.4 mg/dL — ABNORMAL LOW (ref 8.9–10.3)
Chloride: 111 mmol/L (ref 98–111)
Creatinine, Ser: 4.41 mg/dL — ABNORMAL HIGH (ref 0.44–1.00)
GFR, Estimated: 12 mL/min — ABNORMAL LOW (ref >60)
Glucose, Bld: 100 mg/dL — ABNORMAL HIGH (ref 70–99)
Phosphorus: 4.5 mg/dL (ref 2.5–4.6)
Potassium: 5.2 mmol/L — ABNORMAL HIGH (ref 3.5–5.1)
Sodium: 141 mmol/L (ref 135–145)

## 2024-01-13 LAB — GLUCOSE, CAPILLARY
Glucose-Capillary: 93 mg/dL (ref 70–99)
Glucose-Capillary: 99 mg/dL (ref 70–99)
Glucose-Capillary: 198 mg/dL — ABNORMAL HIGH (ref 70–99)
Glucose-Capillary: 148 mg/dL — ABNORMAL HIGH (ref 70–99)

## 2024-01-13 MED ORDER — SENNOSIDES-DOCUSATE SODIUM 8.6-50 MG PO TABS
2.0000 | ORAL_TABLET | Freq: Two times a day (BID) | ORAL | Status: DC
  Administered 2024-01-13 – 2024-01-22 (×17): 2 via ORAL
  Filled 2024-01-13 (×17): qty 2

## 2024-01-13 MED ORDER — INSULIN GLARGINE 100 UNIT/ML ~~LOC~~ SOLN: 12.0000 [IU] | Freq: Every day | SUBCUTANEOUS | Status: DC

## 2024-01-13 MED ORDER — POLYETHYLENE GLYCOL 3350 17 G PO PACK
17.0000 g | PACK | Freq: Two times a day (BID) | ORAL | Status: DC
  Administered 2024-01-13 – 2024-01-19 (×5): 17 g via ORAL
  Filled 2024-01-13 (×16): qty 1

## 2024-01-13 MED ORDER — INSULIN GLARGINE 100 UNIT/ML ~~LOC~~ SOLN
12.0000 [IU] | Freq: Every day | SUBCUTANEOUS | Status: DC
  Administered 2024-01-14 – 2024-01-16 (×3): 12 [IU] via SUBCUTANEOUS
  Filled 2024-01-13 (×4): qty 0.12

## 2024-01-13 MED ORDER — BISACODYL 10 MG RE SUPP
10.0000 mg | Freq: Once | RECTAL | Status: DC
  Filled 2024-01-13: qty 1

## 2024-01-13 MED ORDER — SODIUM ZIRCONIUM CYCLOSILICATE 10 G PO PACK
10.0000 g | PACK | Freq: Every day | ORAL | Status: DC
  Administered 2024-01-13: 11:00:00 10 g via ORAL
  Filled 2024-01-13: qty 1

## 2024-01-13 NOTE — Progress Notes (Signed)
 [facility] Progress Note   Assessment/ Plan:   # AKI on CKD 3b/IV - Most likely secondary to uncontrolled DM and nonketotic hyperosmolar state from insulin  noncompliance.  Also note nephrotic range proteinuria, microscopic hematuria, and anasarca - concerning for possible GN - hold lisinopril  - continue with torsemide  40 mg daily -renal function stable  today, Cr 4.4 -suspect early uremic symptoms given nausea/low appetite/dysgeusia. We did discuss the possibility of her needing to start dialysis while she's here. She would like to wait until the biopsy report before deciding -Avoid nephrotoxic medications including NSAIDs and iodinated intravenous contrast exposure unless the latter is absolutely indicated.  Preferred narcotic agents for pain control are hydromorphone , fentanyl , and methadone. Morphine  should not be used. Avoid Baclofen and avoid oral sodium phosphate and magnesium  citrate based laxatives / bowel preps. Continue strict Input and Output monitoring. Will monitor the patient closely with you and intervene or adjust therapy as indicated by changes in clinical status/labs     # Nephrotic range proteinuria  - UP/cr ratio is 6220 mg/g - she is most likely nephrotic range proteinuria and CKD from her uncontrolled diabetes and thus decreased renal reserve  - serologic work-up with + ANA and SSB, 6 g proteinuria - Added PLA2R antibody (under misc [facility] test - sendout): negative - continue with torsemide  40mg  daily -s/p renal biopsy 12/15-pending read  # N/V -suspect multifactorial: gastroparesis/constipation and uremia -bowel regimen ordered per primary service   # Metabolic acidosis - stable   # HTN  - BP stable   # Normocytic anemia  - may have had component from CKD - iron  deficiency - continue oral iron  -Aranesp  100 mcg weekly (01/10/24) -hgb up to 7.5 today -transfuse prn for hgb <7     Subjective:    Seen in room. Continues to have N/V, hasn't  had a bowel movement in 2 days as per patient. She does report having gastroparesis. Bowel regimen ordered. No other complaints.   Objective:   BP 139/85 (BP Location: Right Arm)   Pulse (!) 117   Temp 98.2 F (36.8 C) (Oral)   Resp 18   Ht 5' 8 (1.727 m)   Wt 89.2 kg   SpO2 99%   BMI 29.90 kg/m   Intake/Output Summary (Last 24 hours) at 01/13/2024 1123 Last data filed at 01/12/2024 2100 Gross per 24 hour  Intake 240 ml  Output --  Net 240 ml    Weight change:   Physical Exam: Gen: ill appearing, sitting in recliner HEENT: periorbital edema CVS: RRR Resp:nonlabored, normal wob Abd: soft Ext: + anasarca (trace) Neuro: awake, alert, no myoclonic jerks observed  Imaging: US  BIOPSY (KIDNEY) Result Date: 01/11/2024 INDICATION: Acute kidney injury in a patient with chronic kidney disease stage IIIB/4 and nephrotic range proteinuria. EXAM: Ultrasound-guided biopsy MEDICATIONS: None. ANESTHESIA/SEDATION: Moderate (conscious) sedation was employed during this procedure. A total of Versed  2 mg and Fentanyl  100 mcg was administered intravenously by the radiology nurse. Total intra-service moderate Sedation Time: 21 minutes. The patient's level of consciousness and vital signs were monitored continuously by radiology nursing throughout the procedure under my direct supervision. COMPLICATIONS: None immediate. PROCEDURE: Informed written consent was obtained from the patient after a thorough discussion of the procedural risks, benefits and alternatives. All questions were addressed. Maximal Sterile Barrier Technique was utilized including caps, mask, sterile gowns, sterile gloves, sterile drape, hand hygiene and skin antiseptic. A timeout was performed prior to the initiation of the procedure. In a prone position on  the table, the flank regions were evaluated with ultrasound the left kidney presents as the optimal biopsy location. The patient's left flank was marked, prepped, and draped in  usual sterile fashion. Local anesthesia was achieved with 1% lidocaine . A small incision was made patient's left flank region. An introducer needle was then advanced under ultrasound guidance to the lower pole the left kidney. The left lower pole renal parenchyma was engaged with the needle. The stylet was then removed. A BioPince needle was then used to achieve 2 core samples which were placed in collection and sent to laboratory for further evaluation. A small volume of Gel-Foam was then injected under ultrasound guidance. The introducer needle was then removed. Sterile dressing was applied. IMPRESSION: Satisfactory core needle biopsy of the lower pole the left kidney. Electronically Signed   By: Cordella Banner   On: 01/11/2024 13:06    Labs: BMET Recent Labs  Lab 01/07/24 9261 01/08/24 0434 01/09/24 0422 01/10/24 0533 01/11/24 0231 01/12/24 0321 01/13/24 0317  NA 136 138 139 137 138 142 141  K 5.2* 4.6 4.5 4.6 4.7 4.8 5.2*  CL 106 110 109 107 109 111 111  CO2 21* 22 22 22  20* 19* 21*  GLUCOSE 150* 142* 148* 172* 143* 118* 100*  BUN 39* 37* 37* 36* 34* 36* 34*  CREATININE 4.04* 4.29* 4.53* 4.52* 4.50* 4.47* 4.41*  CALCIUM  8.1* 8.0* 8.0* 8.1* 8.1* 8.3* 8.4*  PHOS 4.9* 5.0* 5.0* 5.3* 5.0*  --  4.5   CBC Recent Labs  Lab 01/10/24 0716 01/10/24 2326 01/12/24 0321 01/13/24 0317  WBC 7.1 7.5 8.3 7.0  NEUTROABS  --  4.8  --   --   HGB 7.8* 7.6* 8.2* 7.5*  HCT 24.2* 23.2* 26.6* 23.7*  MCV 84.3 84.7 85.8 84.6  PLT 165 161 128* 192    Medications:     acetaminophen   325 mg Oral Once   amLODipine   10 mg Oral Daily   aspirin  EC  81 mg Oral Daily   atorvastatin   40 mg Oral QHS   bisacodyl   10 mg Rectal Once   carvedilol   12.5 mg Oral BID WC   cyclobenzaprine   10 mg Oral BID   darbepoetin (ARANESP ) injection - DIALYSIS  100 mcg Subcutaneous Q Sun-1800   fluticasone  furoate-vilanterol  1 puff Inhalation Daily   gabapentin   100 mg Oral BID   heparin  injection (subcutaneous)   5,000 Units Subcutaneous Q8H   hydrALAZINE   100 mg Oral Q8H   insulin  aspart  0-5 Units Subcutaneous QHS   insulin  aspart  0-6 Units Subcutaneous TID WC   [START ON 01/14/2024] insulin  glargine  12 Units Subcutaneous Daily   iron  polysaccharides  150 mg Oral Daily   melatonin  3 mg Oral QHS   montelukast   10 mg Oral QHS   pantoprazole   40 mg Oral Daily   polyethylene glycol  17 g Oral BID   senna-docusate  2 tablet Oral BID   sodium bicarbonate   650 mg Oral BID   sodium zirconium cyclosilicate   10 g Oral Daily   torsemide   40 mg Oral Daily    Ephriam Stank, MD [facility] 01/13/2024, 11:23 AM

## 2024-01-13 NOTE — Plan of Care (Signed)

## 2024-01-14 ENCOUNTER — Encounter (HOSPITAL_COMMUNITY): Payer: Self-pay

## 2024-01-14 DIAGNOSIS — E1100 Type 2 diabetes mellitus with hyperosmolarity without nonketotic hyperglycemic-hyperosmolar coma (NKHHC): Secondary | ICD-10-CM | POA: Diagnosis not present

## 2024-01-14 DIAGNOSIS — N179 Acute kidney failure, unspecified: Secondary | ICD-10-CM | POA: Diagnosis not present

## 2024-01-14 DIAGNOSIS — I10 Essential (primary) hypertension: Secondary | ICD-10-CM | POA: Diagnosis not present

## 2024-01-14 DIAGNOSIS — E785 Hyperlipidemia, unspecified: Secondary | ICD-10-CM | POA: Diagnosis not present

## 2024-01-14 LAB — RENAL FUNCTION PANEL
Albumin: 2.8 g/dL — ABNORMAL LOW (ref 3.5–5.0)
Anion gap: 7 (ref 5–15)
BUN: 31 mg/dL — ABNORMAL HIGH (ref 6–20)
CO2: 21 mmol/L — ABNORMAL LOW (ref 22–32)
Calcium: 8.5 mg/dL — ABNORMAL LOW (ref 8.9–10.3)
Chloride: 111 mmol/L (ref 98–111)
Creatinine, Ser: 4.43 mg/dL — ABNORMAL HIGH (ref 0.44–1.00)
GFR, Estimated: 12 mL/min — ABNORMAL LOW (ref >60)
Glucose, Bld: 112 mg/dL — ABNORMAL HIGH (ref 70–99)
Phosphorus: 4.3 mg/dL (ref 2.5–4.6)
Potassium: 5.5 mmol/L — ABNORMAL HIGH (ref 3.5–5.1)
Sodium: 140 mmol/L (ref 135–145)

## 2024-01-14 LAB — GLUCOSE, CAPILLARY
Glucose-Capillary: 122 mg/dL — ABNORMAL HIGH (ref 70–99)
Glucose-Capillary: 164 mg/dL — ABNORMAL HIGH (ref 70–99)
Glucose-Capillary: 122 mg/dL — ABNORMAL HIGH (ref 70–99)
Glucose-Capillary: 143 mg/dL — ABNORMAL HIGH (ref 70–99)

## 2024-01-14 LAB — BPAM RBC
Blood Product Expiration Date: 202601192359
Blood Product Expiration Date: 202601192359
Unit Type and Rh: 5100
Unit Type and Rh: 5100

## 2024-01-14 LAB — TYPE AND SCREEN
ABO/RH(D): O POS
Antibody Screen: POSITIVE
Unit division: 0
Unit division: 0

## 2024-01-14 LAB — SURGICAL PATHOLOGY

## 2024-01-14 MED ORDER — LORAZEPAM 0.5 MG PO TABS
0.5000 mg | ORAL_TABLET | Freq: Three times a day (TID) | ORAL | Status: DC | PRN
  Administered 2024-01-14 – 2024-01-20 (×3): 0.5 mg via ORAL
  Filled 2024-01-14 (×3): qty 1

## 2024-01-14 MED ORDER — SODIUM ZIRCONIUM CYCLOSILICATE 10 G PO PACK
10.0000 g | PACK | Freq: Two times a day (BID) | ORAL | Status: DC
  Administered 2024-01-14 (×2): 10 g via ORAL
  Filled 2024-01-14 (×2): qty 1

## 2024-01-14 MED ORDER — LORAZEPAM 2 MG/ML IJ SOLN: 0.5000 mg | Freq: Four times a day (QID) | INTRAMUSCULAR | Status: DC | PRN

## 2024-01-14 MED ORDER — SODIUM CHLORIDE 0.9 % IV SOLN
12.5000 mg | Freq: Three times a day (TID) | INTRAVENOUS | Status: DC | PRN
  Administered 2024-01-14: 13:00:00 12.5 mg via INTRAVENOUS
  Filled 2024-01-14: qty 12.5
  Filled 2024-01-14 (×2): qty 0.5

## 2024-01-14 MED ORDER — METOPROLOL SUCCINATE ER 25 MG PO TB24
12.5000 mg | ORAL_TABLET | Freq: Every day | ORAL | Status: AC
  Administered 2024-01-14 – 2024-01-22 (×8): 12.5 mg via ORAL
  Filled 2024-01-14 (×10): qty 1

## 2024-01-14 MED ORDER — OXYCODONE-ACETAMINOPHEN 5-325 MG PO TABS
1.0000 | ORAL_TABLET | Freq: Three times a day (TID) | ORAL | Status: AC | PRN
  Administered 2024-01-14 – 2024-01-22 (×17): 2 via ORAL
  Filled 2024-01-14 (×17): qty 2

## 2024-01-14 NOTE — Plan of Care (Signed)
  Problem: Skin Integrity: Goal: Risk for impaired skin integrity will decrease Outcome: Progressing   Problem: Coping: Goal: Ability to adjust to condition or change in health will improve Outcome: Progressing   Problem: Activity: Goal: Risk for activity intolerance will decrease Outcome: Progressing

## 2024-01-14 NOTE — Progress Notes (Signed)
 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  Also note nephrotic range proteinuria, microscopic hematuria, and anasarca - concerning for possible GN - hold lisinopril  - continue with torsemide  40 mg daily -renal function stable  today, Cr 4.4 -no reversibility on renal biopsy as below. Overall, she needs to start dialysis especially given her symptoms. We had a lengthy conversation about access placement with [facility] (AVF and TDC placement) and slow start protocol dialysis. Home therapies would not be an option currently as she resides in shelter. I had also discussed with son Collier over her speakerphone. She is not thrilled about this rightfully so. At this junction, she wants to hold off and think about it more. I did let her know that time is of the essence, would not like to do this emergently. Will revisit our conversations tomorrow therefore holding off on consulting [facility] -Avoid nephrotoxic medications including NSAIDs and iodinated intravenous contrast exposure unless the latter is absolutely indicated.  Preferred narcotic agents for pain control are hydromorphone , fentanyl , and methadone. Morphine  should not be used. Avoid Baclofen and avoid oral sodium phosphate and magnesium  citrate based laxatives / bowel preps. Continue strict Input and Output monitoring. 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 Continues to have N/V, was able to eat this morning. No other complaints.   Objective:   BP 132/80   Pulse (!) 125   Temp 99.1 F (37.3 C) (Oral)   Resp 18   Ht 5' 8 (1.727 m)   Wt 86.6 kg   SpO2 92%   BMI 29.03 kg/m   Intake/Output Summary (Last 24 hours) at 01/14/2024 1138 Last data filed at 01/13/2024 1230 Gross per 24 hour  Intake 360 ml  Output --  Net 360 ml    Weight change:   Physical Exam: Gen: ill appearing, laying flat in bed HEENT: periorbital edema CVS: RRR Resp:nonlabored, normal wob Abd: soft Ext: + anasarca (trace) Neuro: awake, alert, no myoclonic jerks observed  Imaging: No results found.   Labs: BMET Recent Labs  Lab 01/08/24 0434 01/09/24 0422 01/10/24 0533 01/11/24 0231 01/12/24 0321 01/13/24 0317 01/14/24 0347  NA 138 139 137 138 142 141 140  K 4.6 4.5 4.6 4.7 4.8 5.2* 5.5*  CL 110 109 107 109 111 111 111  CO2 22 22 22  20* 19* 21* 21*  GLUCOSE  142* 148* 172* 143* 118* 100* 112*  BUN 37* 37* 36* 34* 36* 34* 31*  CREATININE 4.29* 4.53* 4.52* 4.50* 4.47* 4.41* 4.43*  CALCIUM  8.0* 8.0* 8.1* 8.1* 8.3* 8.4* 8.5*  PHOS 5.0* 5.0* 5.3* 5.0*  --  4.5 4.3   CBC Recent Labs  Lab 01/10/24 0716 01/10/24 2326 01/12/24 0321 01/13/24 0317  WBC 7.1 7.5 8.3 7.0  NEUTROABS  --  4.8  --   --   HGB 7.8* 7.6* 8.2* 7.5*  HCT 24.2* 23.2* 26.6* 23.7*  MCV 84.3 84.7 85.8 84.6  PLT 165 161 128* 192     Medications:     acetaminophen   325 mg Oral Once   amLODipine   10 mg Oral Daily   aspirin  EC  81 mg Oral Daily   atorvastatin   40 mg Oral QHS   bisacodyl   10 mg Rectal Once   cyclobenzaprine   10 mg Oral BID   darbepoetin (ARANESP ) injection - DIALYSIS  100 mcg Subcutaneous Q Sun-1800   fluticasone  furoate-vilanterol  1 puff Inhalation Daily   gabapentin   100 mg Oral BID   heparin  injection (subcutaneous)  5,000 Units Subcutaneous Q8H   hydrALAZINE   100 mg Oral Q8H   insulin  aspart  0-5 Units Subcutaneous QHS   insulin  aspart  0-6 Units Subcutaneous TID WC   insulin  glargine  12 Units Subcutaneous Daily   iron  polysaccharides  150 mg Oral Daily   melatonin  3 mg Oral QHS   metoprolol  succinate  12.5 mg Oral Daily   montelukast   10 mg Oral QHS   pantoprazole   40 mg Oral Daily   polyethylene glycol  17 g Oral BID   senna-docusate  2 tablet Oral BID   sodium bicarbonate   650 mg Oral BID   sodium zirconium cyclosilicate   10 g Oral BID   torsemide   40 mg Oral Daily    Ephriam Stank, MD [facility] 01/14/2024, 11:38 AM

## 2024-01-14 NOTE — Progress Notes (Signed)
 PROGRESS NOTE        PATIENT DETAILS Name: Sara [name] Age: 48 y.o. Sex: female Date of Birth: 16-Jan-1976 Admit Date: 12/30/2023 Admitting Physician Madison DELENA Peaches, MD ERE:Jwizmdnw, Ronalee, MD  Brief Summary: Patient is a 48 y.o.  female with history of DM-2-who initially presented to the hospital with suicidal evaluation and hyperosmolar nonketotic hyperglycemia.  Hospital course complicated by AKI-s/p renal biopsy 12/15.  Significant events: 12/4>> admit to [facility] 12/15>> renal biopsy  Significant studies: 12/5>> renal ultrasound: No hydronephrosis  Significant microbiology data: None  Procedures: 12/15>> renal biopsy  Consults: Nephrology Psychiatry Interventional radiology  Subjective: Continues to complain of nausea-had several BMs yesterday and again today.  Not keen on starting HD.  Objective: Vitals: Blood pressure 132/80, pulse (!) 125, temperature 99.1 F (37.3 C), temperature source Oral, resp. rate 18, height 5' 8 (1.727 m), weight 86.6 kg, SpO2 92%.   Exam: Gen Exam:Alert awake-not in any distress HEENT:atraumatic, normocephalic Chest: B/L clear to auscultation anteriorly CVS:S1S2 regular Abdomen:soft non tender, non distended Extremities:no edema Neurology: Non focal Skin: no rash  Pertinent Labs/Radiology:    Latest Ref Rng & Units 01/13/2024    3:17 AM 01/12/2024    3:21 AM 01/10/2024   11:26 PM  CBC  WBC 4.0 - 10.5 K/uL 7.0  8.3  7.5   Hemoglobin 12.0 - 15.0 g/dL 7.5  8.2  7.6   Hematocrit 36.0 - 46.0 % 23.7  26.6  23.2   Platelets 150 - 400 K/uL 192  128  161     Lab Results  Component Value Date   NA 140 01/14/2024   K 5.5 (H) 01/14/2024   CL 111 01/14/2024   CO2 21 (L) 01/14/2024      Assessment/Plan: Hyperosmolar nonketotic hyperglycemia Resolved with IVF/IV fluids  DM-2 with uncontrolled hyperglycemia (A1c 8.6 on 9/9) CBG stable-but has poor appetite secondary to nausea Continue Lantus  12 units+  SSI-once nausea improves-May need to adjust insulin  dosage   Recent Labs    01/13/24 1549 01/13/24 2020 01/14/24 0821  GLUCAP 198* 148* 122*     AKI on CKD stage IIIb Underlying CKD felt to be due to diabetic nephropathy-awaiting renal biopsy results Continues to have persistent nausea/low appetite-she is not keen on starting HD unless she gets renal biopsy results back-currently pending.  Suspicion for nausea related to AKI-see below.  Hyperkalemia Continues to have mild hyperkalemia-change Lokelma  to twice daily dosing Repeatedly fluids tomorrow  Nausea Suspicion that this may be related to AKI She is now having numerous BMs and continues to be nauseous Switch Dilaudid  to oral Percocet (on Percocet intermittently at home) Continue antiemetics-and follow.  Multifactorial anemia Secondary to underlying CKD/critical illness/iron  deficiency Continued iron  supplementation S/p 1 unit of PRBC on 12/6 Follow CBC periodically  HTN BP relatively stable She does not want to be on Coreg -she is okay to being switched to metoprolol  Continue amlodipine  Follow/optimize.  HLD Statin  GERD PPI  Suicidal ideation Recurrent major depressive disorder Evaluated by psychiatry-initially inpatient psychiatry was recommended-however due to prolonged hospitalization-her symptoms have improved-psychiatry no longer recommending inpatient admission-bedside sitter has been discontinued Continue trazodone  for sleep Needs outpatient follow-up with psychiatry  Homelessness TOC evaluation prior to discharge  Code status:   Code Status: Full Code   DVT Prophylaxis: heparin  injection 5,000 Units Start: 01/11/24 2200   Family Communication: None at  bedside   Disposition Plan: Status is: Inpatient Remains inpatient appropriate because: Severity of illness   Planned Discharge Destination:Home   Diet: Diet Order             Diet regular Room service appropriate? Yes; Fluid  consistency: Thin  Diet effective now                     Antimicrobial agents: Anti-infectives (From admission, onward)    None        MEDICATIONS: Scheduled Meds:  acetaminophen   325 mg Oral Once   amLODipine   10 mg Oral Daily   aspirin  EC  81 mg Oral Daily   atorvastatin   40 mg Oral QHS   bisacodyl   10 mg Rectal Once   cyclobenzaprine   10 mg Oral BID   darbepoetin (ARANESP ) injection - DIALYSIS  100 mcg Subcutaneous Q Sun-1800   fluticasone  furoate-vilanterol  1 puff Inhalation Daily   gabapentin   100 mg Oral BID   heparin  injection (subcutaneous)  5,000 Units Subcutaneous Q8H   hydrALAZINE   100 mg Oral Q8H   insulin  aspart  0-5 Units Subcutaneous QHS   insulin  aspart  0-6 Units Subcutaneous TID WC   insulin  glargine  12 Units Subcutaneous Daily   iron  polysaccharides  150 mg Oral Daily   melatonin  3 mg Oral QHS   metoprolol  succinate  12.5 mg Oral Daily   montelukast   10 mg Oral QHS   pantoprazole   40 mg Oral Daily   polyethylene glycol  17 g Oral BID   senna-docusate  2 tablet Oral BID   sodium bicarbonate   650 mg Oral BID   sodium zirconium cyclosilicate   10 g Oral BID   torsemide   40 mg Oral Daily   Continuous Infusions: PRN Meds:.acetaminophen  **OR** acetaminophen , albuterol , cyclobenzaprine , dextrose , hydrALAZINE , ondansetron  **OR** ondansetron  (ZOFRAN ) IV, oxyCODONE -acetaminophen , sodium chloride , traZODone    I have personally reviewed following labs and imaging studies  LABORATORY DATA: CBC: Recent Labs  Lab 01/09/24 0422 01/10/24 0716 01/10/24 2326 01/12/24 0321 01/13/24 0317  WBC 7.7 7.1 7.5 8.3 7.0  NEUTROABS  --   --  4.8  --   --   HGB 7.6* 7.8* 7.6* 8.2* 7.5*  HCT 23.5* 24.2* 23.2* 26.6* 23.7*  MCV 84.2 84.3 84.7 85.8 84.6  PLT 174 165 161 128* 192    Basic Metabolic Panel: Recent Labs  Lab 01/09/24 0422 01/10/24 0533 01/11/24 0231 01/12/24 0321 01/13/24 0317 01/14/24 0347  NA 139 137 138 142 141 140  K 4.5 4.6 4.7 4.8  5.2* 5.5*  CL 109 107 109 111 111 111  CO2 22 22 20* 19* 21* 21*  GLUCOSE 148* 172* 143* 118* 100* 112*  BUN 37* 36* 34* 36* 34* 31*  CREATININE 4.53* 4.52* 4.50* 4.47* 4.41* 4.43*  CALCIUM  8.0* 8.1* 8.1* 8.3* 8.4* 8.5*  PHOS 5.0* 5.3* 5.0*  --  4.5 4.3    GFR: Estimated Creatinine Clearance: 17.7 mL/min (A) (by C-G formula based on SCr of 4.43 mg/dL (H)).  Liver Function Tests: Recent Labs  Lab 01/09/24 0422 01/10/24 0533 01/11/24 0231 01/13/24 0317 01/14/24 0347  ALBUMIN  2.3* 2.2* 2.3* 2.8* 2.8*   No results for input(s): LIPASE, AMYLASE in the last 168 hours. No results for input(s): AMMONIA in the last 168 hours.  Coagulation Profile: Recent Labs  Lab 01/08/24 0434  INR 0.9    Cardiac Enzymes: No results for input(s): CKTOTAL, CKMB, CKMBINDEX, TROPONINI in the last 168 hours.  BNP (last 3  results) No results for input(s): PROBNP in the last 8760 hours.  Lipid Profile: No results for input(s): CHOL, HDL, LDLCALC, TRIG, CHOLHDL, LDLDIRECT in the last 72 hours.  Thyroid Function Tests: No results for input(s): TSH, T4TOTAL, FREET4, T3FREE, THYROIDAB in the last 72 hours.  Anemia Panel: No results for input(s): VITAMINB12, FOLATE, FERRITIN, TIBC, IRON , RETICCTPCT in the last 72 hours.  Urine analysis:    Component Value Date/Time   COLORURINE YELLOW 01/01/2024 0943   APPEARANCEUR HAZY (A) 01/01/2024 0943   LABSPEC 1.011 01/01/2024 0943   PHURINE 6.0 01/01/2024 0943   GLUCOSEU >=500 (A) 01/01/2024 0943   HGBUR MODERATE (A) 01/01/2024 0943   BILIRUBINUR NEGATIVE 01/01/2024 0943   KETONESUR NEGATIVE 01/01/2024 0943   PROTEINUR >=300 (A) 01/01/2024 0943   UROBILINOGEN 0.2 12/11/2009 1249   NITRITE NEGATIVE 01/01/2024 0943   LEUKOCYTESUR NEGATIVE 01/01/2024 0943    Sepsis Labs: Lactic Acid, Venous No results found for: LATICACIDVEN  MICROBIOLOGY: No results found for this or any previous visit (from  the past 240 hours).  RADIOLOGY STUDIES/RESULTS: No results found.    LOS: 14 days   Donalda Applebaum, MD  [facility]    To contact the attending provider between 7A-7P or the covering provider during after hours 7P-7A, please log into the web site [email or web address] and access using universal [place] password for that web site. If you do not have the password, please call the hospital operator.  01/14/2024, 10:22 AM

## 2024-01-14 NOTE — Plan of Care (Signed)
°  Problem: Nutritional: Goal: Maintenance of adequate nutrition will improve Outcome: Progressing   Problem: Metabolic: Goal: Ability to maintain appropriate glucose levels will improve Outcome: Progressing

## 2024-01-14 NOTE — Progress Notes (Addendum)
 [facility] night cross cover note:   I was notified by the patient's RN at the patient's request for medication for anxiety.  I subsequently ordered Ativan  0.5 mg PO every 8 hours as needed for anxiety.     Eva Pore, DO Hospitalist

## 2024-01-15 ENCOUNTER — Inpatient Hospital Stay (HOSPITAL_COMMUNITY): Payer: MEDICAID

## 2024-01-15 DIAGNOSIS — E1100 Type 2 diabetes mellitus with hyperosmolarity without nonketotic hyperglycemic-hyperosmolar coma (NKHHC): Secondary | ICD-10-CM | POA: Diagnosis not present

## 2024-01-15 DIAGNOSIS — I10 Essential (primary) hypertension: Secondary | ICD-10-CM | POA: Diagnosis not present

## 2024-01-15 DIAGNOSIS — N189 Chronic kidney disease, unspecified: Secondary | ICD-10-CM | POA: Diagnosis not present

## 2024-01-15 DIAGNOSIS — E785 Hyperlipidemia, unspecified: Secondary | ICD-10-CM | POA: Diagnosis not present

## 2024-01-15 DIAGNOSIS — N179 Acute kidney failure, unspecified: Secondary | ICD-10-CM | POA: Diagnosis not present

## 2024-01-15 HISTORY — PX: IR TUNNELED CENTRAL VENOUS CATH PLC W IMG: IMG1939

## 2024-01-15 LAB — GLUCOSE, CAPILLARY
Glucose-Capillary: 110 mg/dL — ABNORMAL HIGH (ref 70–99)
Glucose-Capillary: 101 mg/dL — ABNORMAL HIGH (ref 70–99)
Glucose-Capillary: 170 mg/dL — ABNORMAL HIGH (ref 70–99)
Glucose-Capillary: 91 mg/dL (ref 70–99)

## 2024-01-15 LAB — CBC
HCT: 23.9 % — ABNORMAL LOW (ref 36.0–46.0)
Hemoglobin: 7.7 g/dL — ABNORMAL LOW (ref 12.0–15.0)
MCH: 28 pg (ref 26.0–34.0)
MCHC: 32.2 g/dL (ref 30.0–36.0)
MCV: 86.9 fL (ref 80.0–100.0)
Platelets: 192 K/uL (ref 150–400)
RBC: 2.75 MIL/uL — ABNORMAL LOW (ref 3.87–5.11)
RDW: 13.2 % (ref 11.5–15.5)
WBC: 7.7 K/uL (ref 4.0–10.5)
nRBC: 0 % (ref 0.0–0.2)

## 2024-01-15 LAB — RENAL FUNCTION PANEL
Albumin: 2.7 g/dL — ABNORMAL LOW (ref 3.5–5.0)
Anion gap: 8 (ref 5–15)
BUN: 33 mg/dL — ABNORMAL HIGH (ref 6–20)
CO2: 21 mmol/L — ABNORMAL LOW (ref 22–32)
Calcium: 8.8 mg/dL — ABNORMAL LOW (ref 8.9–10.3)
Chloride: 110 mmol/L (ref 98–111)
Creatinine, Ser: 4.57 mg/dL — ABNORMAL HIGH (ref 0.44–1.00)
GFR, Estimated: 11 mL/min — ABNORMAL LOW
Glucose, Bld: 135 mg/dL — ABNORMAL HIGH (ref 70–99)
Phosphorus: 4.8 mg/dL — ABNORMAL HIGH (ref 2.5–4.6)
Potassium: 5.6 mmol/L — ABNORMAL HIGH (ref 3.5–5.1)
Sodium: 139 mmol/L (ref 135–145)

## 2024-01-15 LAB — HEPATITIS B SURFACE ANTIGEN: Hepatitis B Surface Ag: NONREACTIVE

## 2024-01-15 MED ORDER — ALTEPLASE 2 MG IJ SOLR: 2.0000 mg | Freq: Once | INTRAMUSCULAR | Status: DC | PRN

## 2024-01-15 MED ORDER — HEPARIN SODIUM (PORCINE) 1000 UNIT/ML IJ SOLN
INTRAMUSCULAR | Status: AC
  Filled 2024-01-15: qty 10

## 2024-01-15 MED ORDER — LIDOCAINE HCL (PF) 1 % IJ SOLN: 5.0000 mL | INTRAMUSCULAR | Status: DC | PRN

## 2024-01-15 MED ORDER — HEPARIN SODIUM (PORCINE) 1000 UNIT/ML IJ SOLN
10000.0000 [IU] | Freq: Once | INTRAMUSCULAR | Status: AC
  Administered 2024-01-15: 3.2 mL
  Filled 2024-01-15: qty 10

## 2024-01-15 MED ORDER — HEPARIN SODIUM (PORCINE) 1000 UNIT/ML DIALYSIS: 1000.0000 [IU] | INTRAMUSCULAR | Status: DC | PRN

## 2024-01-15 MED ORDER — SODIUM ZIRCONIUM CYCLOSILICATE 10 G PO PACK
10.0000 g | PACK | Freq: Three times a day (TID) | ORAL | Status: DC
  Administered 2024-01-15 – 2024-01-16 (×3): 10 g via ORAL
  Filled 2024-01-15 (×3): qty 1

## 2024-01-15 MED ORDER — MIDAZOLAM HCL (PF) 2 MG/2ML IJ SOLN
INTRAMUSCULAR | Status: AC | PRN
  Administered 2024-01-15 (×2): .5 mg via INTRAVENOUS
  Administered 2024-01-15: 1 mg via INTRAVENOUS

## 2024-01-15 MED ORDER — HYDROMORPHONE HCL 1 MG/ML IJ SOLN
0.5000 mg | Freq: Once | INTRAMUSCULAR | Status: AC
  Administered 2024-01-15: 0.5 mg via INTRAVENOUS
  Filled 2024-01-15: qty 0.5

## 2024-01-15 MED ORDER — LIDOCAINE-EPINEPHRINE 1 %-1:100000 IJ SOLN
INTRAMUSCULAR | Status: AC
  Filled 2024-01-15: qty 20

## 2024-01-15 MED ORDER — PENTAFLUOROPROP-TETRAFLUOROETH EX AERO: 1.0000 | INHALATION_SPRAY | CUTANEOUS | Status: DC | PRN

## 2024-01-15 MED ORDER — FENTANYL CITRATE (PF) 100 MCG/2ML IJ SOLN
INTRAMUSCULAR | Status: AC | PRN
  Administered 2024-01-15: 50 ug via INTRAVENOUS
  Administered 2024-01-15: 25 ug via INTRAVENOUS

## 2024-01-15 MED ORDER — FENTANYL CITRATE (PF) 100 MCG/2ML IJ SOLN
INTRAMUSCULAR | Status: AC
  Filled 2024-01-15: qty 2

## 2024-01-15 MED ORDER — ANTICOAGULANT SODIUM CITRATE 4% (200MG/5ML) IV SOLN: 5.0000 mL | Status: DC | PRN

## 2024-01-15 MED ORDER — VANCOMYCIN HCL IN DEXTROSE 1-5 GM/200ML-% IV SOLN
INTRAVENOUS | Status: AC
  Filled 2024-01-15: qty 200

## 2024-01-15 MED ORDER — LIDOCAINE-EPINEPHRINE 1 %-1:100000 IJ SOLN
20.0000 mL | Freq: Once | INTRAMUSCULAR | Status: AC
  Administered 2024-01-15: 12 mL via INTRADERMAL
  Filled 2024-01-15: qty 20

## 2024-01-15 MED ORDER — CHLORHEXIDINE GLUCONATE CLOTH 2 % EX PADS
6.0000 | MEDICATED_PAD | Freq: Every day | CUTANEOUS | Status: AC
  Administered 2024-01-15 – 2024-01-22 (×8): 6 via TOPICAL

## 2024-01-15 MED ORDER — LIDOCAINE-PRILOCAINE 2.5-2.5 % EX CREA: 1.0000 | TOPICAL_CREAM | CUTANEOUS | Status: DC | PRN

## 2024-01-15 MED ORDER — MIDAZOLAM HCL 2 MG/2ML IJ SOLN
INTRAMUSCULAR | Status: AC
  Filled 2024-01-15: qty 2

## 2024-01-15 NOTE — Progress Notes (Signed)
 "                        PROGRESS NOTE        PATIENT DETAILS Name: Sara [name] Age: 48 y.o. Sex: female Date of Birth: 1975/04/01 Admit Date: 12/30/2023 Admitting Physician Madison DELENA Peaches, MD ERE:Jwizmdnw, Ronalee, MD  Brief Summary: Patient is a 48 y.o.  female with history of DM-2-who initially presented to the hospital with suicidal evaluation and hyperosmolar nonketotic hyperglycemia.  Hospital course complicated by AKI-s/p renal biopsy 12/15.  Significant events: 12/4>> admit to [facility] 12/15>> renal biopsy 12/19>> after extensive discussion-consented for HD and TDC placement.  Significant studies: 12/5>> renal ultrasound: No hydronephrosis  Significant microbiology data: None  Procedures: 12/15>> renal biopsy (diabetic kidney disease, aterionephrosclerosis. 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 rate 18, height 5' 8 (1.727 m), weight 87 kg, SpO2 95%.   Exam: Gen Exam:Alert awake-not in any distress HEENT:atraumatic, normocephalic Chest: B/L clear to auscultation anteriorly CVS:S1S2 regular Abdomen:soft non tender, non distended Extremities:no edema Neurology: Non focal Skin: no rash  Pertinent Labs/Radiology:    Latest Ref Rng & Units 01/15/2024    2:27 AM 01/13/2024    3:17 AM 01/12/2024    3:21 AM  CBC  WBC 4.0 - 10.5 K/uL 7.7  7.0  8.3   Hemoglobin 12.0 - 15.0 g/dL 7.7  7.5  8.2   Hematocrit 36.0 - 46.0 % 23.9  23.7  26.6   Platelets 150 - 400 K/uL 192  192  128     Lab Results  Component Value Date   NA 139 01/15/2024   K 5.6 (H) 01/15/2024   CL 110 01/15/2024   CO2 21 (L) 01/15/2024      Assessment/Plan: Hyperosmolar nonketotic hyperglycemia Resolved with IVF/IV  fluids  DM-2 with uncontrolled hyperglycemia (A1c 8.6 on 9/9) CBG stable-but has poor appetite secondary to nausea Continue Lantus  12 units+ SSI-once nausea improves-May need to adjust insulin  dosage   Recent Labs    01/14/24 1548 01/14/24 2108 01/15/24 0755  GLUCAP 122* 143* 110*     AKI on CKD stage IIIb with progression to ESRD Underwent renal biopsy 12/15-diabetic kidney-no reversibility-initially very hesitant to start HD-but long discussion with her this morning-explained that her hyperkalemia is worsening in spite of being on Lokelma -she continues to have nausea related to uremia-we have no way of fixing this apart from HD.  She subsequently agreed-called nephrology-keep n.p.o.-IR to [facility] later today-and HD to start subsequently.    Hyperkalemia Continues to have mild hyperkalemia although slightly worse-change Lokelma  to 3 times daily dosing.  Nausea Suspicion that this may be related to AKI She is now having numerous BMs and continues to be nauseous No longer on IV Dilaudid -has been switched to oral Percocet (on Percocet intermittently at home) Continue antiemetics-and follow.  Multifactorial anemia Secondary to underlying CKD/critical illness/iron  deficiency Continued iron  supplementation S/p 1 unit of PRBC on 12/6 Follow CBC periodically  HTN BP relatively stable Continue amlodipine /metoprolol  (does not want to take Coreg )  Follow/optimize.  HLD Statin  GERD PPI  Suicidal ideation Recurrent major depressive disorder Evaluated by psychiatry-initially inpatient psychiatry was recommended-however due to prolonged hospitalization-her symptoms have improved-psychiatry no longer recommending inpatient admission-bedside sitter has been discontinued Continue trazodone  for  sleep Needs outpatient follow-up with psychiatry  Homelessness TOC evaluation prior to discharge  Code status:   Code Status: Full Code   DVT Prophylaxis: heparin  injection 5,000  Units Start: 01/11/24 2200   Family Communication: None at bedside   Disposition Plan: Status is: Inpatient Remains inpatient appropriate because: Severity of illness   Planned Discharge Destination:Home   Diet: Diet Order             Diet NPO time specified  Diet effective now                     Antimicrobial agents: Anti-infectives (From admission, onward)    None        MEDICATIONS: Scheduled Meds:  acetaminophen   325 mg Oral Once   amLODipine   10 mg Oral Daily   aspirin  EC  81 mg Oral Daily   atorvastatin   40 mg Oral QHS   bisacodyl   10 mg Rectal Once   Chlorhexidine  Gluconate Cloth  6 each Topical Q0600   cyclobenzaprine   10 mg Oral BID   darbepoetin (ARANESP ) injection - DIALYSIS  100 mcg Subcutaneous Q Sun-1800   fluticasone  furoate-vilanterol  1 puff Inhalation Daily   gabapentin   100 mg Oral BID   heparin  injection (subcutaneous)  5,000 Units Subcutaneous Q8H   hydrALAZINE   100 mg Oral Q8H   insulin  aspart  0-5 Units Subcutaneous QHS   insulin  aspart  0-6 Units Subcutaneous TID WC   insulin  glargine  12 Units Subcutaneous Daily   iron  polysaccharides  150 mg Oral Daily   melatonin  3 mg Oral QHS   metoprolol  succinate  12.5 mg Oral Daily   montelukast   10 mg Oral QHS   pantoprazole   40 mg Oral Daily   polyethylene glycol  17 g Oral BID   senna-docusate  2 tablet Oral BID   sodium bicarbonate   650 mg Oral BID   sodium zirconium cyclosilicate   10 g Oral TID   torsemide   40 mg Oral Daily   Continuous Infusions:  promethazine  (PHENERGAN ) injection (IM or IVPB) 150 mL/hr at 01/14/24 1524   PRN Meds:.acetaminophen  **OR** acetaminophen , albuterol , cyclobenzaprine , dextrose , hydrALAZINE , LORazepam , ondansetron  **OR** ondansetron  (ZOFRAN ) IV, oxyCODONE -acetaminophen , promethazine  (PHENERGAN ) injection (IM or IVPB), sodium chloride , traZODone    I have personally reviewed following labs and imaging studies  LABORATORY DATA: CBC: Recent Labs   Lab 01/10/24 0716 01/10/24 2326 01/12/24 0321 01/13/24 0317 01/15/24 0227  WBC 7.1 7.5 8.3 7.0 7.7  NEUTROABS  --  4.8  --   --   --   HGB 7.8* 7.6* 8.2* 7.5* 7.7*  HCT 24.2* 23.2* 26.6* 23.7* 23.9*  MCV 84.3 84.7 85.8 84.6 86.9  PLT 165 161 128* 192 192    Basic Metabolic Panel: Recent Labs  Lab 01/10/24 0533 01/11/24 0231 01/12/24 0321 01/13/24 0317 01/14/24 0347 01/15/24 0227  NA 137 138 142 141 140 139  K 4.6 4.7 4.8 5.2* 5.5* 5.6*  CL 107 109 111 111 111 110  CO2 22 20* 19* 21* 21* 21*  GLUCOSE 172* 143* 118* 100* 112* 135*  BUN 36* 34* 36* 34* 31* 33*  CREATININE 4.52* 4.50* 4.47* 4.41* 4.43* 4.57*  CALCIUM  8.1* 8.1* 8.3* 8.4* 8.5* 8.8*  PHOS 5.3* 5.0*  --  4.5 4.3 4.8*    GFR: Estimated Creatinine Clearance: 17.2 mL/min (A) (by C-G formula based on SCr of 4.57 mg/dL (H)).  Liver Function Tests: Recent Labs  Lab 01/10/24 0533 01/11/24 0231 01/13/24 0317 01/14/24 0347 01/15/24  0227  ALBUMIN  2.2* 2.3* 2.8* 2.8* 2.7*   No results for input(s): LIPASE, AMYLASE in the last 168 hours. No results for input(s): AMMONIA in the last 168 hours.  Coagulation Profile: No results for input(s): INR, PROTIME in the last 168 hours.   Cardiac Enzymes: No results for input(s): CKTOTAL, CKMB, CKMBINDEX, TROPONINI in the last 168 hours.  BNP (last 3 results) No results for input(s): PROBNP in the last 8760 hours.  Lipid Profile: No results for input(s): CHOL, HDL, LDLCALC, TRIG, CHOLHDL, LDLDIRECT in the last 72 hours.  Thyroid Function Tests: No results for input(s): TSH, T4TOTAL, FREET4, T3FREE, THYROIDAB in the last 72 hours.  Anemia Panel: No results for input(s): VITAMINB12, FOLATE, FERRITIN, TIBC, IRON , RETICCTPCT in the last 72 hours.  Urine analysis:    Component Value Date/Time   COLORURINE YELLOW 01/01/2024 0943   APPEARANCEUR HAZY (A) 01/01/2024 0943   LABSPEC 1.011 01/01/2024 0943   PHURINE  6.0 01/01/2024 0943   GLUCOSEU >=500 (A) 01/01/2024 0943   HGBUR MODERATE (A) 01/01/2024 0943   BILIRUBINUR NEGATIVE 01/01/2024 0943   KETONESUR NEGATIVE 01/01/2024 0943   PROTEINUR >=300 (A) 01/01/2024 0943   UROBILINOGEN 0.2 12/11/2009 1249   NITRITE NEGATIVE 01/01/2024 0943   LEUKOCYTESUR NEGATIVE 01/01/2024 0943    Sepsis Labs: Lactic Acid, Venous No results found for: LATICACIDVEN  MICROBIOLOGY: No results found for this or any previous visit (from the past 240 hours).  RADIOLOGY STUDIES/RESULTS: No results found.    LOS: 15 days   Donalda Applebaum, MD  [facility]    To contact the attending provider between 7A-7P or the covering provider during after hours 7P-7A, please log into the web site [email or web address] and access using universal [place] password for that web site. If you do not have the password, please call the hospital operator.  01/15/2024, 11:36 AM    "

## 2024-01-15 NOTE — Plan of Care (Signed)
   Problem: Education: Goal: Knowledge of General Education information will improve Description: Including pain rating scale, medication(s)/side effects and non-pharmacologic comfort measures Outcome: Progressing   Problem: Pain Managment: Goal: General experience of comfort will improve and/or be controlled Outcome: Progressing   Problem: Safety: Goal: Ability to remain free from injury will improve Outcome: Progressing

## 2024-01-15 NOTE — Procedures (Signed)
 Received patient in bed to unit.  Alert and oriented.  Informed consent signed and in chart.   TX duration: 2 hrs  Patient tolerated well.  Transported back to the room  Alert, without acute distress.  Hand-off given to patient's nurse.   Access used: R IJ Access issues: None  Total UF removed: 1 L Medication(s) given: None Post HD weight: 86.2kg Post HD VS: 154/89   Daniesha Driver [facility]

## 2024-01-15 NOTE — Plan of Care (Signed)
   Problem: Fluid Volume: Goal: Ability to maintain a balanced intake and output will improve Outcome: Progressing   Problem: Nutritional: Goal: Maintenance of adequate nutrition will improve Outcome: Progressing

## 2024-01-15 NOTE — Progress Notes (Addendum)
 Notified by nephrologist for CLIP to out-pt HD clinic. Pt is ESRD. Pt in IR at this time, will follow up on Monday in person to discuss options. Will begin Clip after this conversation.   Mayco Walrond Dialysis Navigator [phone]

## 2024-01-15 NOTE — TOC Progression Note (Signed)
 Transition of Care [facility]) - Progression Note    Patient Details  Name: Sara [name] MRN: [id number] Date of Birth: 09-Nov-1975  Transition of Care [facility]) CM/SW Contact  Landry DELENA Senters, RN Phone Number: 01/15/2024, 3:07 PM  Clinical Narrative:     Patient has continued to have a decrease in her kidney function through the week. Now plan is start dialysis. Patient will be having tunneled cath placed and HD plan to be initiated.   CM will continue to follow.                    Expected Discharge Plan and Services                                               Social Drivers of Health (SDOH) Interventions SDOH Screenings   Food Insecurity: Food Insecurity Present (01/12/2024)  Housing: High Risk (01/12/2024)  Transportation Needs: No Transportation Needs (01/12/2024)  Recent Concern: Transportation Needs - Unmet Transportation Needs (12/31/2023)  Utilities: Not At Risk (12/31/2023)  Alcohol Screen: Low Risk (10/14/2022)  Financial Resource Strain: At Risk (03/16/2023)   Received from [facility]  Physical Activity: Not at Risk (03/16/2023)   Received from [facility]  Social Connections: At Risk (03/16/2023)   Received from [facility]  Stress: Not at Risk (03/16/2023)   Received from [facility]  Tobacco Use: High Risk (01/07/2024)    Readmission Risk Interventions     No data to display

## 2024-01-15 NOTE — Progress Notes (Signed)
 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  Also note nephrotic range proteinuria, microscopic hematuria, and anasarca - concerning for possible GN - hold lisinopril , continue with torsemide  40 mg daily -no reversibility on renal biopsy as below.  -we continued our discussions in regards to dialysis, she is now accepting of this after thinking about it some more -pending TDC placement for today. HD#1 planned for today then #2 tomorrow -CLIP: renal navigator informed -Avoid nephrotoxic medications including NSAIDs and iodinated intravenous contrast exposure unless the latter is absolutely indicated.  Preferred narcotic agents for pain control are hydromorphone , fentanyl , and methadone. Morphine  should not be used. Avoid Baclofen and avoid oral sodium phosphate and magnesium  citrate based laxatives / bowel preps. Continue strict Input and Output monitoring. Will monitor the patient closely with you and intervene or adjust therapy as indicated by changes in clinical status/labs     # Nephrotic range proteinuria  - UP/cr ratio is 6220 mg/g - she is most likely nephrotic range proteinuria and CKD from her uncontrolled diabetes and thus decreased renal reserve  - serologic work-up with + ANA and SSB, 6 g proteinuria - Added PLA2R antibody (under misc [facility] test - sendout): negative - continue with torsemide  40mg  daily, can transition to OFF-HD days once stable on HD -s/p renal biopsy 12/15-diabetic kidney disease, aterionephrosclerosis. 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 Nauseas slightly better. She is accepting of HD now. TDC placement pending with IR   Objective:   BP 123/68   Pulse (!) 102   Temp 98.1 F (36.7 C) (Oral)   Resp 18   Ht 5' 8 (1.727 m)   Wt 87 kg   SpO2 95%   BMI 29.16 kg/m   Intake/Output Summary (Last 24 hours) at 01/15/2024 1110 Last data filed at 01/14/2024 1524 Gross per 24 hour  Intake 49.68 ml  Output --  Net 49.68 ml    Weight change: 0.4 kg  Physical Exam: Gen: ill appearing, laying flat in bed, NAD HEENT: periorbital edema CVS: RRR Resp:nonlabored, normal wob Abd: soft Ext: + anasarca (trace) Neuro: awake, alert, no myoclonic jerks observed  Imaging: No results found.   Labs: BMET Recent Labs  Lab 01/09/24 0422 01/10/24 0533 01/11/24 0231 01/12/24 0321 01/13/24 0317 01/14/24 0347 01/15/24 0227  NA 139 137 138 142 141 140 139  K 4.5 4.6 4.7 4.8 5.2* 5.5* 5.6*  CL 109 107 109 111 111 111 110  CO2 22 22 20* 19* 21* 21* 21*  GLUCOSE 148* 172* 143* 118* 100* 112* 135*  BUN 37* 36* 34* 36* 34* 31* 33*  CREATININE 4.53* 4.52* 4.50* 4.47* 4.41* 4.43* 4.57*  CALCIUM  8.0* 8.1* 8.1* 8.3* 8.4* 8.5* 8.8*  PHOS 5.0* 5.3* 5.0*  --  4.5 4.3 4.8*  CBC Recent Labs  Lab 01/10/24 2326 01/12/24 0321 01/13/24 0317 01/15/24 0227  WBC 7.5 8.3 7.0 7.7  NEUTROABS 4.8  --   --   --   HGB 7.6* 8.2* 7.5* 7.7*  HCT 23.2* 26.6* 23.7* 23.9*  MCV 84.7 85.8 84.6 86.9  PLT 161 128* 192 192    Medications:     acetaminophen   325 mg Oral Once   amLODipine   10 mg Oral Daily   aspirin  EC  81 mg Oral Daily   atorvastatin   40 mg Oral QHS   bisacodyl   10 mg Rectal Once   Chlorhexidine  Gluconate Cloth  6 each Topical Q0600   cyclobenzaprine   10  mg Oral BID   darbepoetin (ARANESP ) injection - DIALYSIS  100 mcg Subcutaneous Q Sun-1800   fluticasone  furoate-vilanterol  1 puff Inhalation Daily   gabapentin   100 mg Oral BID   heparin  injection (subcutaneous)  5,000 Units Subcutaneous Q8H   hydrALAZINE   100 mg Oral Q8H   insulin  aspart  0-5 Units Subcutaneous QHS   insulin  aspart  0-6 Units Subcutaneous TID WC   insulin  glargine  12 Units Subcutaneous Daily   iron  polysaccharides  150 mg Oral Daily   melatonin  3 mg Oral QHS   metoprolol  succinate  12.5 mg Oral Daily   montelukast   10 mg Oral QHS   pantoprazole   40 mg Oral Daily   polyethylene glycol  17 g Oral BID   senna-docusate  2 tablet Oral BID   sodium bicarbonate   650 mg Oral BID   sodium zirconium cyclosilicate   10 g Oral TID   torsemide   40 mg Oral Daily    Ephriam Stank, MD [facility] 01/15/2024, 11:10 AM

## 2024-01-15 NOTE — Procedures (Signed)
" °  Procedure:  R internal jugular tunneled HD CVC placement Palindrome 19 to svc/ra jct Preprocedure diagnosis: The primary encounter diagnosis was Hyperglycemia. Diagnoses of Noncompliance and AKI (acute kidney injury) were also pertinent to this visit. Postprocedure diagnosis: same EBL:    minimal Complications:   none immediate  See full dictation in [employer].  CHARM Toribio Faes MD Main # [phone] Pager  [phone] Mobile [phone]    "

## 2024-01-15 NOTE — Consult Note (Signed)
 "  Chief Complaint: AKI on CKD, now ESRD - IR consulted for tunneled hemodialysis catheter placement  Referring Provider(s): Dennise Hoes, MD   Supervising Physician: Vanice Revel  Patient Status: [facility] - In-pt  History of Present Illness: Sara [name] is a 48 y.o. female with hx of GERD, depression, type 2 diabetes mellitus, H. pylori infection, hypertension, dyslipidemia and OSA. She initially presented the hospital 12/31/23 for psychiatric evaluation for SI, also found to have hyperosmolar nonketotic hyperglycemia. Hospital course has been complicated by AKI. 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 Pt evaluated by nephrology with determination patient now with ESRD and wanting to start dialysis. IR has now been consulted for tunneled hemodialysis catheter placement.  Today patient largely without complaint. Last had some clear tea and medications at 0300 this AM. All questions answered.   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 pylori infection    High risk medication use    Hypercholesterolemia    Hyperlipidemia, mixed    Hypertension    Other mixed anxiety disorders    Pollen allergies    Sickle cell trait    Sleep apnea    Vitamin D deficiency     Past Surgical History:  Procedure Laterality Date   ABDOMINAL HYSTERECTOMY     APPENDECTOMY     COLONOSCOPY  08-16-2008   Dr. Princella   ESOPHAGOGASTRODUODENOSCOPY  08-16-2008   Dr. Anette     Allergies: Amoxicillin, Biaxin  [clarithromycin ], Penicillins, Tetracyclines & related, Vibramycin [doxycycline], Flagyl  [metronidazole ], and Red dye #40 (allura red)  Medications: Prior to Admission medications  Medication Sig Start Date End Date  Taking? Authorizing Provider  acetaminophen  (TYLENOL ) 650 MG CR tablet Take 650-1,300 mg by mouth daily as needed for pain.   Yes [provider]  ADVAIR DISKUS 250-50 MCG/ACT AEPB Inhale 1 puff into the lungs in the morning and at bedtime. 11/28/23  Yes [provider]  albuterol  (VENTOLIN  HFA) 108 (90 Base) MCG/ACT inhaler Inhale 2 puffs into the lungs every 6 (six) hours as needed for wheezing or shortness of breath. 10/09/23  Yes Emokpae, Courage, MD  amLODipine  (NORVASC ) 10 MG tablet Take 1 tablet (10 mg total) by mouth daily. 10/10/23  Yes Emokpae, Courage, MD  atorvastatin  (LIPITOR) 40 MG tablet Take 1 tablet (40 mg total) by mouth daily. Patient taking differently: Take 40 mg by mouth at bedtime. 01/12/23  Yes Shafer, Jorene, NP  bisacodyl  5 MG EC tablet Take 5 mg by mouth daily as needed for mild constipation.   Yes [provider]  gabapentin  (NEURONTIN ) 300 MG capsule Take 2 capsules (600 mg total) by mouth 3 (three) times daily. Patient taking differently: Take 600 mg by mouth 2 (two) times daily as needed (for diabetic neuropathy). 10/21/22 12/31/23 Yes Ntuen, Ellouise BROCKS, FNP  ipratropium-albuterol  (DUONEB) 0.5-2.5 (3) MG/3ML SOLN Take 3 mLs by nebulization every 6 (six) hours as needed. 11/27/23  Yes [provider]  metoprolol  succinate (TOPROL -XL) 25 MG 24 hr tablet Take 0.5 tablets (12.5 mg total) by mouth daily. 10/09/23  Yes Emokpae, Courage, MD  montelukast  (SINGULAIR ) 10 MG tablet Take 1 tablet (10 mg total) by mouth at bedtime. 10/09/23  Yes Emokpae,  Courage, MD  omeprazole  (PRILOSEC) 40 MG capsule Take 1 capsule (40 mg total) by mouth daily. 10/09/23  Yes Emokpae, Courage, MD  ondansetron  (ZOFRAN -ODT) 4 MG disintegrating tablet Take 4 mg by mouth every 6 (six) hours as needed. 12/06/23  Yes [provider]  aspirin  EC 81 MG tablet Take 1 tablet (81 mg total) by mouth daily with breakfast. Swallow whole. Patient not taking: Reported on 12/31/2023  10/09/23   Pearlean Manus, MD  azithromycin  (ZITHROMAX ) 500 MG tablet Take 500 mg by mouth daily. Patient not taking: Reported on 12/31/2023 12/21/23   [provider]  cyclobenzaprine  (FLEXERIL ) 10 MG tablet Take 10 mg by mouth 2 (two) times daily. Patient not taking: Reported on 12/31/2023 08/12/23   [provider]  fluticasone  furoate-vilanterol (BREO ELLIPTA ) 100-25 MCG/ACT AEPB Inhale 1 puff into the lungs daily. Patient not taking: Reported on 12/31/2023 10/10/23   Pearlean Manus, MD  insulin  aspart (NOVOLOG ) 100 UNIT/ML injection Inject 0-15 Units into the skin 3 (three) times daily with meals. Patient not taking: Reported on 12/31/2023 10/21/22   Raye Ellouise BROCKS, FNP  LANTUS  SOLOSTAR 100 UNIT/ML Solostar Pen Inject 15 Units into the skin at bedtime. Patient not taking: Reported on 12/31/2023 11/30/23   [provider]  lisinopril  (ZESTRIL ) 20 MG tablet Take 1 tablet (20 mg total) by mouth daily. Patient not taking: Reported on 12/31/2023 10/09/23   Pearlean Manus, MD  senna-docusate (COLACE 2-IN-1) 8.6-50 MG tablet Take 1 tablet by mouth daily as needed (constipation). Patient not taking: Reported on 12/31/2023    [provider]     Family History  Problem Relation Age of Onset   Clotting disorder Mother    Crohn's disease Mother    Heart disease Mother    Colon cancer Father    Clotting disorder Brother    Diabetes Brother    Diabetes Maternal Aunt    Liver cancer Maternal Uncle    Prostate cancer Maternal Uncle    Colon cancer Maternal Uncle    Diabetes Paternal Aunt    Esophageal cancer Paternal Uncle     Social History   Socioeconomic History   Marital status: Divorced    Spouse name: Not on file   Number of children: 2   Years of education: Not on file   Highest education level: Not on file  Occupational History   Occupation: n/a  Tobacco Use   Smoking status: Every Day    Current packs/day: 1.00    Types: Cigarettes   Smokeless  tobacco: Never   Tobacco comments:    Will order nicotine  gum  Vaping Use   Vaping status: Never Used  Substance and Sexual Activity   Alcohol use: Not Currently   Drug use: Not Currently    Types: Marijuana, Cocaine    Comment: 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   Sexual activity: Not Currently  Other Topics Concern   Not on file  Social History Narrative   Caffiene 1 cup daily   Living shelter in [place], [place]   Social Drivers of Health   Tobacco Use: High Risk (01/07/2024)   Patient History    Smoking Tobacco Use: Every Day    Smokeless Tobacco Use: Never    Passive Exposure: Not on file  Financial Resource Strain: At Risk (03/16/2023)   Received from [employer] Strain    Hard to pay for: Food: 2  Food Insecurity: Food Insecurity Present (01/12/2024)   Epic  Worried About Programme Researcher, Broadcasting/film/video in the Last Year: Sometimes true    [employer] of Food in the Last Year: Sometimes true  Transportation Needs: No Transportation Needs (01/12/2024)   Epic    Lack of Transportation (Medical): No    Lack of Transportation (Non-Medical): No  Recent Concern: Transportation Needs - Unmet Transportation Needs (12/31/2023)   Epic    Lack of Transportation (Medical): Yes    Lack of Transportation (Non-Medical): Yes  Physical Activity: Not at Risk (03/16/2023)   Received from [facility]   Physical Activity    Weekly Physical Activity: 1  Stress: Not at Risk (03/16/2023)   Received from [facility]   Stress    Do you feel these kinds of stress these days?: 1  Social Connections: At Risk (03/16/2023)   Received from [facility]   Social Connections    How often do you feel lonely or isolated from those around you? : 2  Depression (PHQ2-9): Not on file  Alcohol Screen: Low Risk (10/14/2022)   Alcohol Screen    Last Alcohol Screening Score (AUDIT): 3  Housing: High Risk (01/12/2024)   Epic    Unable to Pay for Housing in the Last Year: No    Number of Times Moved in the Last  Year: Not on file    Homeless in the Last Year: Yes  Utilities: Not At Risk (12/31/2023)   Epic    Threatened with loss of utilities: No  Health Literacy: Not on file     Review of Systems: A 12 point ROS discussed and pertinent positives are indicated in the HPI above.  All other systems are negative.   Vital Signs: BP 123/68   Pulse (!) 102   Temp 98.1 F (36.7 C) (Oral)   Resp 18   Ht 5' 8 (1.727 m)   Wt 191 lb 12.8 oz (87 kg)   SpO2 95%   BMI 29.16 kg/m   Advance Care Plan: No documents on file  Physical Exam Vitals and nursing note reviewed.  Constitutional:      Appearance: Normal appearance.  HENT:     Mouth/Throat:     Mouth: Mucous membranes are moist.     Pharynx: Oropharynx is clear.  Cardiovascular:     Rate and Rhythm: Regular rhythm. Tachycardia present.  Pulmonary:     Effort: Pulmonary effort is normal.     Breath sounds: Normal breath sounds.  Abdominal:     Palpations: Abdomen is soft.     Tenderness: There is no abdominal tenderness.  Musculoskeletal:     Right lower leg: No edema.     Left lower leg: No edema.  Skin:    General: Skin is warm and dry.  Neurological:     Mental Status: She is alert and oriented to person, place, and time. Mental status is at baseline.     Imaging: US  BIOPSY (KIDNEY) Result Date: 01/11/2024 INDICATION: Acute kidney injury in a patient with chronic kidney disease stage IIIB/4 and nephrotic range proteinuria. EXAM: Ultrasound-guided biopsy MEDICATIONS: None. ANESTHESIA/SEDATION: Moderate (conscious) sedation was employed during this procedure. A total of Versed  2 mg and Fentanyl  100 mcg was administered intravenously by the radiology nurse. Total intra-service moderate Sedation Time: 21 minutes. The patient's level of consciousness and vital signs were monitored continuously by radiology nursing throughout the procedure under my direct supervision. COMPLICATIONS: None immediate. PROCEDURE: Informed written consent  was obtained from the patient after a thorough discussion of the procedural risks, benefits and alternatives.  All questions were addressed. Maximal Sterile Barrier Technique was utilized including caps, mask, sterile gowns, sterile gloves, sterile drape, hand hygiene and skin antiseptic. A timeout was performed prior to the initiation of the procedure. In a prone position on the table, the flank regions were evaluated with ultrasound the left kidney presents as the optimal biopsy location. The patient's left flank was marked, prepped, and draped in usual sterile fashion. Local anesthesia was achieved with 1% lidocaine . A small incision was made patient's left flank region. An introducer needle was then advanced under ultrasound guidance to the lower pole the left kidney. The left lower pole renal parenchyma was engaged with the needle. The stylet was then removed. A BioPince needle was then used to achieve 2 core samples which were placed in collection and sent to laboratory for further evaluation. A small volume of Gel-Foam was then injected under ultrasound guidance. The introducer needle was then removed. Sterile dressing was applied. IMPRESSION: Satisfactory core needle biopsy of the lower pole the left kidney. Electronically Signed   By: Cordella Banner   On: 01/11/2024 13:06   US  RENAL Result Date: 01/01/2024 CLINICAL DATA:  Acute kidney injury EXAM: RENAL / URINARY TRACT ULTRASOUND COMPLETE COMPARISON:  CT abdomen and pelvis dated 12/06/2023 FINDINGS: Right Kidney: Length = 11.8 cm AP renal pelvis diameter = <10 mm Diffusely increased cortical echogenicity with preserved corticomedullary differentiation which can be seen with medical renal disease. Extrarenal pelvis. No urinary tract dilation or shadowing calculi. The ureter is not seen. Left Kidney: Length = 12.5 cm AP renal pelvis diameter = <10 mm Diffusely increased cortical echogenicity with preserved corticomedullary differentiation which can be  seen with medical renal disease. No urinary tract dilation or shadowing calculi. The ureter is not seen. Bladder: Appears normal for degree of bladder distention. Prevoid bladder volume measures 139 mL Other: None. IMPRESSION: Diffusely increased cortical echogenicity with preserved corticomedullary differentiation which can be seen with medical renal disease. No urinary tract dilation or shadowing calculi. Electronically Signed   By: Limin  Xu M.D.   On: 01/01/2024 13:19    Labs:  CBC: Recent Labs    01/10/24 2326 01/12/24 0321 01/13/24 0317 01/15/24 0227  WBC 7.5 8.3 7.0 7.7  HGB 7.6* 8.2* 7.5* 7.7*  HCT 23.2* 26.6* 23.7* 23.9*  PLT 161 128* 192 192    COAGS: Recent Labs    01/08/24 0434  INR 0.9    BMP: Recent Labs    01/12/24 0321 01/13/24 0317 01/14/24 0347 01/15/24 0227  NA 142 141 140 139  K 4.8 5.2* 5.5* 5.6*  CL 111 111 111 110  CO2 19* 21* 21* 21*  GLUCOSE 118* 100* 112* 135*  BUN 36* 34* 31* 33*  CALCIUM  8.3* 8.4* 8.5* 8.8*  CREATININE 4.47* 4.41* 4.43* 4.57*  GFRNONAA 11* 12* 12* 11*    LIVER FUNCTION TESTS: Recent Labs    10/05/23 2010 10/06/23 0859 10/08/23 0413 12/31/23 0021 01/04/24 0702 01/07/24 0738 01/11/24 0231 01/13/24 0317 01/14/24 0347 01/15/24 0227  BILITOT 0.4 0.4  --  0.7 0.7  --   --   --   --   --   AST 18 16  --  22 14*  --   --   --   --   --   ALT 15 14  --  31 19  --   --   --   --   --   ALKPHOS 61 57  --  88 65  --   --   --   --   --  PROT 5.5* 5.3*  --  5.4* 5.3*  --   --   --   --   --   ALBUMIN  2.4* 2.2*   < > 2.3* 2.1*   < > 2.3* 2.8* 2.8* 2.7*   < > = values in this interval not displayed.    TUMOR MARKERS: No results for input(s): AFPTM, CEA, CA199, CHROMGRNA in the last 8760 hours.  Assessment and Plan:  Sara [name] is a 48 y.o. female with hx of GERD, depression, type 2 diabetes mellitus, H. pylori infection, hypertension, dyslipidemia and OSA. She initially presented the hospital 12/31/23 for  psychiatric evaluation for SI, also found to have hyperosmolar nonketotic hyperglycemia. Hospital course has been complicated by AKI. 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 Pt evaluated by nephrology with determination patient now with ESRD and wanting to start dialysis. IR has now been consulted for tunneled hemodialysis catheter placement.  Today patient largely without complaint. Last had some clear tea and medications at 0300 this AM. All questions answered.  Risks and benefits discussed with the patient including, but not limited to bleeding, infection, vascular injury, pneumothorax which may require chest tube placement, air embolism or even death. All of the patient's questions were answered, patient is agreeable to proceed. Consent signed and in chart.   Thank you for allowing our service to participate in Sara [name] 's care.  Electronically Signed: Kimble VEAR Clas, PA-C   01/15/2024, 9:16 AM      I spent a total of 20 Minutes    in face to face in clinical consultation, greater than 50% of which was counseling/coordinating care for tunneled hemodialysis catheter placement   "

## 2024-01-16 DIAGNOSIS — E785 Hyperlipidemia, unspecified: Secondary | ICD-10-CM | POA: Diagnosis not present

## 2024-01-16 DIAGNOSIS — N179 Acute kidney failure, unspecified: Secondary | ICD-10-CM | POA: Diagnosis not present

## 2024-01-16 DIAGNOSIS — E1100 Type 2 diabetes mellitus with hyperosmolarity without nonketotic hyperglycemic-hyperosmolar coma (NKHHC): Secondary | ICD-10-CM | POA: Diagnosis not present

## 2024-01-16 DIAGNOSIS — I10 Essential (primary) hypertension: Secondary | ICD-10-CM | POA: Diagnosis not present

## 2024-01-16 LAB — CBC
HCT: 24.3 % — ABNORMAL LOW (ref 36.0–46.0)
Hemoglobin: 7.7 g/dL — ABNORMAL LOW (ref 12.0–15.0)
MCH: 26.8 pg (ref 26.0–34.0)
MCHC: 31.7 g/dL (ref 30.0–36.0)
MCV: 84.7 fL (ref 80.0–100.0)
Platelets: 201 K/uL (ref 150–400)
RBC: 2.87 MIL/uL — ABNORMAL LOW (ref 3.87–5.11)
RDW: 12.8 % (ref 11.5–15.5)
WBC: 6 K/uL (ref 4.0–10.5)
nRBC: 0 % (ref 0.0–0.2)

## 2024-01-16 LAB — RENAL FUNCTION PANEL
Albumin: 2.6 g/dL — ABNORMAL LOW (ref 3.5–5.0)
Anion gap: 8 (ref 5–15)
BUN: 22 mg/dL — ABNORMAL HIGH (ref 6–20)
CO2: 24 mmol/L (ref 22–32)
Calcium: 8.1 mg/dL — ABNORMAL LOW (ref 8.9–10.3)
Chloride: 105 mmol/L (ref 98–111)
Creatinine, Ser: 3.66 mg/dL — ABNORMAL HIGH (ref 0.44–1.00)
GFR, Estimated: 14 mL/min — ABNORMAL LOW
Glucose, Bld: 79 mg/dL (ref 70–99)
Phosphorus: 3.8 mg/dL (ref 2.5–4.6)
Potassium: 4.6 mmol/L (ref 3.5–5.1)
Sodium: 138 mmol/L (ref 135–145)

## 2024-01-16 LAB — GLUCOSE, CAPILLARY
Glucose-Capillary: 82 mg/dL (ref 70–99)
Glucose-Capillary: 60 mg/dL — ABNORMAL LOW (ref 70–99)
Glucose-Capillary: 67 mg/dL — ABNORMAL LOW (ref 70–99)
Glucose-Capillary: 85 mg/dL (ref 70–99)
Glucose-Capillary: 90 mg/dL (ref 70–99)
Glucose-Capillary: 124 mg/dL — ABNORMAL HIGH (ref 70–99)

## 2024-01-16 MED ORDER — ACETAMINOPHEN 325 MG PO TABS
ORAL_TABLET | ORAL | Status: AC
  Filled 2024-01-16: qty 2

## 2024-01-16 MED ORDER — HEPARIN SODIUM (PORCINE) 1000 UNIT/ML IJ SOLN
3200.0000 [IU] | Freq: Once | INTRAMUSCULAR | Status: AC
  Administered 2024-01-16: 3200 [IU]

## 2024-01-16 MED ORDER — HEPARIN SODIUM (PORCINE) 1000 UNIT/ML IJ SOLN
INTRAMUSCULAR | Status: AC
  Filled 2024-01-16: qty 4

## 2024-01-16 NOTE — Plan of Care (Signed)

## 2024-01-16 NOTE — Progress Notes (Signed)
 Received patient in bed to unit.  Alert and oriented.  Informed consent signed and in chart.   TX duration:2.5 hours  Patient tolerated well.  Transported back to the room  Alert, without acute distress.  Hand-off given to patient's nurse.   Access used: R Chest HD cath Access issues: none  Total UF removed: 1L   01/16/24 1739  Vitals  Temp 98.3 F (36.8 C)  Temp Source Oral  BP (!) 151/85  MAP (mmHg) 105  Pulse Rate (!) 109  Resp 16  Type of Weight Post-Dialysis  Oxygen Therapy  SpO2 99 %  O2 Device Room Air  Patient Activity (if Appropriate) In bed  During Treatment Monitoring  Duration of HD Treatment -hour(s) 2.5 hour(s)  HD Safety Checks Performed Yes  Intra-Hemodialysis Comments Tolerated well;Tx completed  Post Treatment  Dialyzer Clearance Lightly streaked  Liters Processed 37.5  Fluid Removed (mL) 1000 mL  Tolerated HD Treatment Yes  Hemodialysis Catheter Right Internal jugular Double lumen Permanent (Tunneled)  Placement Date/Time: 01/15/24 1243   Serial / Lot #: [id number]  Expiration Date: 06/05/28  Time Out: Correct patient;Correct site;Correct procedure  Maximum sterile barrier precautions: Hand hygiene;Cap;Mask;Sterile gown;Sterile gloves;Large sterile s...  Site Condition No complications  Blue Lumen Status Flushed;Antimicrobial dead end cap;Heparin  locked  Red Lumen Status Flushed;Antimicrobial dead end cap;Heparin  locked  Purple Lumen Status N/A  Catheter fill solution Heparin  1000 units/ml  Catheter fill volume (Arterial) 1.6 cc  Catheter fill volume (Venous) 1.6  Dressing Type Transparent  Dressing Status Clean, Dry, Intact  Drainage Description None  Dressing Change Due 01/23/24  Post treatment catheter status Capped and Clamped     Camellia Brasil LPN [facility]

## 2024-01-16 NOTE — Progress Notes (Signed)
 "                        PROGRESS NOTE        PATIENT DETAILS Name: Sara [name] Age: 48 y.o. Sex: female Date of Birth: 19-Jun-1975 Admit Date: 12/30/2023 Admitting Physician Madison DELENA Peaches, MD ERE:[email or web address], Ronalee, MD  Brief Summary: Patient is a 48 y.o.  female with history of DM-2-who initially presented to the hospital with suicidal evaluation and hyperosmolar nonketotic hyperglycemia.  Hospital course complicated by AKI-s/p renal biopsy 12/15.  Significant events: 12/4>> admit to [facility] 12/15>> renal biopsy 12/19>> started HD  Significant studies: 12/5>> renal ultrasound: No hydronephrosis  Significant microbiology data: None  Procedures: 12/15>> renal biopsy (diabetic kidney disease, aterionephrosclerosis. 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  Does not want to try Reglan   Objective: Vitals: Blood pressure 123/72, pulse 97, temperature 97.8 F (36.6 C), temperature source Oral, resp. rate 19, height 5' 8 (1.727 m), weight 86.2 kg, SpO2 94%.   Exam: Gen Exam:Alert awake-not in any distress HEENT:atraumatic, normocephalic Chest: B/L clear to auscultation anteriorly CVS:S1S2 regular Abdomen:soft non tender, non distended Extremities:no edema Neurology: Non focal Skin: no rash  Pertinent Labs/Radiology:    Latest Ref Rng & Units 01/16/2024    3:27 AM 01/15/2024    2:27 AM 01/13/2024    3:17 AM  CBC  WBC 4.0 - 10.5 K/uL 6.0  7.7  7.0   Hemoglobin 12.0 - 15.0 g/dL 7.7  7.7  7.5   Hematocrit 36.0 - 46.0 % 24.3  23.9  23.7   Platelets 150 - 400 K/uL 201  192  192     Lab Results  Component Value Date   NA 138 01/16/2024   K 4.6 01/16/2024   CL 105 01/16/2024   CO2 24 01/16/2024      Assessment/Plan: Hyperosmolar nonketotic  hyperglycemia Resolved with IVF/IV fluids  DM-2 with uncontrolled hyperglycemia (A1c 8.6 on 9/9) CBG stable-but has poor appetite secondary to nausea Continue Lantus  12 units+ SSI-once nausea improves-May need to adjust insulin  dosage   Recent Labs    01/15/24 1602 01/15/24 2159 01/16/24 0738  GLUCAP 170* 91 82     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  Nephrology following and directing care.  Hyperkalemia Solved after HD-can stop Lokelma .  Nausea Suspicion that this may be related to AKI Overall better but still some nausea this morning Wonder if she has a component of gastroparesis but she is very hesitant to try Reglan  Continue Zofran /Phenergan  for now See how she does-and revisit Reglan  issue if she has persistent nausea Note-last BM was yesterday-abdomen is soft on exam.  Multifactorial anemia Secondary to underlying CKD/critical illness/iron  deficiency Continued iron  supplementation S/p 1 unit of PRBC on 12/6 Follow CBC periodically  HTN BP relatively stable Continue amlodipine /metoprolol  (does not want to take Coreg )  Follow/optimize.  HLD Statin  GERD PPI  Suicidal ideation Recurrent major depressive disorder Evaluated by psychiatry-initially inpatient psychiatry was recommended-however due to prolonged hospitalization-her symptoms have improved-psychiatry no longer recommending inpatient admission-bedside sitter has been discontinued Continue trazodone  for sleep Needs outpatient follow-up with psychiatry  Homelessness TOC evaluation prior to discharge  Code  status:   Code Status: Full Code   DVT Prophylaxis: heparin  injection 5,000 Units Start: 01/11/24 2200   Family Communication: None at bedside   Disposition Plan: Status is: Inpatient Remains inpatient  appropriate because: Severity of illness   Planned Discharge Destination:Home   Diet: Diet Order             Diet renal/carb modified with fluid restriction Diet-HS Snack? Nothing; Fluid restriction: 1500 mL Fluid; Room service appropriate? Yes; Fluid consistency: Thin  Diet effective now                     Antimicrobial agents: Anti-infectives (From admission, onward)    None        MEDICATIONS: Scheduled Meds:  acetaminophen   325 mg Oral Once   amLODipine   10 mg Oral Daily   aspirin  EC  81 mg Oral Daily   atorvastatin   40 mg Oral QHS   bisacodyl   10 mg Rectal Once   Chlorhexidine  Gluconate Cloth  6 each Topical Q0600   cyclobenzaprine   10 mg Oral BID   darbepoetin (ARANESP ) injection - DIALYSIS  100 mcg Subcutaneous Q Sun-1800   fluticasone  furoate-vilanterol  1 puff Inhalation Daily   gabapentin   100 mg Oral BID   heparin  injection (subcutaneous)  5,000 Units Subcutaneous Q8H   hydrALAZINE   100 mg Oral Q8H   insulin  aspart  0-5 Units Subcutaneous QHS   insulin  aspart  0-6 Units Subcutaneous TID WC   insulin  glargine  12 Units Subcutaneous Daily   iron  polysaccharides  150 mg Oral Daily   melatonin  3 mg Oral QHS   metoprolol  succinate  12.5 mg Oral Daily   montelukast   10 mg Oral QHS   pantoprazole   40 mg Oral Daily   polyethylene glycol  17 g Oral BID   senna-docusate  2 tablet Oral BID   sodium bicarbonate   650 mg Oral BID   sodium zirconium cyclosilicate   10 g Oral TID   torsemide   40 mg Oral Daily   Continuous Infusions:  promethazine  (PHENERGAN ) injection (IM or IVPB) 150 mL/hr at 01/14/24 1524   PRN Meds:.acetaminophen  **OR** acetaminophen , albuterol , cyclobenzaprine , dextrose , hydrALAZINE , LORazepam , ondansetron  **OR** ondansetron  (ZOFRAN ) IV, oxyCODONE -acetaminophen , promethazine  (PHENERGAN ) injection (IM or IVPB), sodium chloride , traZODone    I have personally reviewed following labs and imaging studies  LABORATORY DATA: CBC: Recent Labs   Lab 01/10/24 2326 01/12/24 0321 01/13/24 0317 01/15/24 0227 01/16/24 0327  WBC 7.5 8.3 7.0 7.7 6.0  NEUTROABS 4.8  --   --   --   --   HGB 7.6* 8.2* 7.5* 7.7* 7.7*  HCT 23.2* 26.6* 23.7* 23.9* 24.3*  MCV 84.7 85.8 84.6 86.9 84.7  PLT 161 128* 192 192 201    Basic Metabolic Panel: Recent Labs  Lab 01/11/24 0231 01/12/24 0321 01/13/24 0317 01/14/24 0347 01/15/24 0227 01/16/24 0327  NA 138 142 141 140 139 138  K 4.7 4.8 5.2* 5.5* 5.6* 4.6  CL 109 111 111 111 110 105  CO2 20* 19* 21* 21* 21* 24  GLUCOSE 143* 118* 100* 112* 135* 79  BUN 34* 36* 34* 31* 33* 22*  CREATININE 4.50* 4.47* 4.41* 4.43* 4.57* 3.66*  CALCIUM  8.1* 8.3* 8.4* 8.5* 8.8* 8.1*  PHOS 5.0*  --  4.5 4.3 4.8* 3.8    GFR: Estimated Creatinine Clearance: 21.4 mL/min (A) (by C-G formula based on SCr of 3.66 mg/dL (H)).  Liver Function Tests: Recent Labs  Lab 01/11/24 0231 01/13/24 0317 01/14/24 0347 01/15/24  0227 01/16/24 0327  ALBUMIN  2.3* 2.8* 2.8* 2.7* 2.6*   No results for input(s): LIPASE, AMYLASE in the last 168 hours. No results for input(s): AMMONIA in the last 168 hours.  Coagulation Profile: No results for input(s): INR, PROTIME in the last 168 hours.   Cardiac Enzymes: No results for input(s): CKTOTAL, CKMB, CKMBINDEX, TROPONINI in the last 168 hours.  BNP (last 3 results) No results for input(s): PROBNP in the last 8760 hours.  Lipid Profile: No results for input(s): CHOL, HDL, LDLCALC, TRIG, CHOLHDL, LDLDIRECT in the last 72 hours.  Thyroid Function Tests: No results for input(s): TSH, T4TOTAL, FREET4, T3FREE, THYROIDAB in the last 72 hours.  Anemia Panel: No results for input(s): VITAMINB12, FOLATE, FERRITIN, TIBC, IRON , RETICCTPCT in the last 72 hours.  Urine analysis:    Component Value Date/Time   COLORURINE YELLOW 01/01/2024 0943   APPEARANCEUR HAZY (A) 01/01/2024 0943   LABSPEC 1.011 01/01/2024 0943   PHURINE  6.0 01/01/2024 0943   GLUCOSEU >=500 (A) 01/01/2024 0943   HGBUR MODERATE (A) 01/01/2024 0943   BILIRUBINUR NEGATIVE 01/01/2024 0943   KETONESUR NEGATIVE 01/01/2024 0943   PROTEINUR >=300 (A) 01/01/2024 0943   UROBILINOGEN 0.2 12/11/2009 1249   NITRITE NEGATIVE 01/01/2024 0943   LEUKOCYTESUR NEGATIVE 01/01/2024 0943    Sepsis Labs: Lactic Acid, Venous No results found for: LATICACIDVEN  MICROBIOLOGY: No results found for this or any previous visit (from the past 240 hours).  RADIOLOGY STUDIES/RESULTS: IR TUNNELED CENTRAL VENOUS CATH [facility] W IMG Result Date: 01/15/2024 EXAM: ULTRASOUND GUIDED VASCULAR ACCESS. FLUOROSCOPY GUIDED PLACEMENT OF A TUNNELED CATHETER. MODERATE CONSCIOUS SEDATION 01/15/2024 12:55:00 PM SEDATION: 75 micrograms fentanyl , 2 milligrams of versed , 14 minutes. FLUOROSCOPY DOSE AND TYPE: Radiation Exposure Index: Reference Air Kerma (in mGy) = less than 0.1 mGy. TECHNIQUE: Informed consent was obtained after a detailed explanation of the procedure including risks, benefits, and alternatives. All aspects of maximum sterile barrier technique were used including washing hands with conventional soap and water  or with alcohol-based hand rubs (ABHR), skin preparation, cap, mask, sterile gown, sterile gloves, and sterile full body drape. Local anesthesia was achieved with lidocaine . A micropuncture needle was used to access the right internal jugular vein using ultrasound guidance. An ultrasound image demonstrating patency of the vein with needle tip located within it was obtained and stored in PACS. A 0.035 guidewire was used to place a peel-away sheath. A subcutaneous tunnel was created to the infraclavicular region and a tunneled Palindrome 19 hemodialysis catheter was pulled through the subcutaneous tunnel to the venotomy site and advanced through the peel-away sheath under fluoroscopic guidance to the right atrium. The catheter flushed easily and there was a good blood return.  The catheter was sutured to the skin. The catheter was locked with heparinized saline. Periprocedural antibiotic coverage using vancomycin . The patient tolerated the procedure well and there were no immediate complications. COMPARISON: None available. CLINICAL HISTORY: AKI on CKD, now ESRD, needs   access for  hemodialysis. FINDINGS: Fluoroscopic image demonstrates the tip of the catheter in the right atrium. IMPRESSION: 1. Successful ultrasound and fluoroscopy guided placement of a tunneled Palindrome 19 hemodialysis catheter. Electronically signed by: Dayne Hassell MD 01/15/2024 03:17 PM EST RP Workstation: [id number]      LOS: 16 days   Donalda Applebaum, MD  [facility]    To contact the attending provider between 7A-7P or the covering provider during after hours 7P-7A, please log into the web site [email or web address] and access using universal [employer] password for  that web site. If you do not have the password, please call the hospital operator.  01/16/2024, 9:45 AM    "

## 2024-01-16 NOTE — Progress Notes (Signed)
 [facility] Progress Note   Assessment/ Plan:   # AKI on CKD 3b/IV->now ESRD - Most likely secondary to uncontrolled DM and nonketotic hyperosmolar state from insulin  noncompliance.  Also note nephrotic range proteinuria, microscopic hematuria, and anasarca - concerning for possible GN - hold lisinopril , continue with torsemide  40 mg daily, plan to transition this to of-HD days once stable on HD -no reversibility on renal biopsy as below.  -we continued our discussions in regards to dialysis, she is now accepting of this after thinking about it some more -pending TDC placement for today. HD#1 12/19. HD#2 today. #3 planned for Monday (tentatively) -consult [facility] on Monday for perm access creation  -CLIP: renal navigator following -Avoid nephrotoxic medications including NSAIDs and iodinated intravenous contrast exposure unless the latter is absolutely indicated.  Preferred narcotic agents for pain control are hydromorphone , fentanyl , and methadone. Morphine  should not be used. Avoid Baclofen and avoid oral sodium phosphate and magnesium  citrate based laxatives / bowel preps. Continue strict Input and Output monitoring. Will monitor the patient closely with you and intervene or adjust therapy as indicated by changes in clinical status/labs     # Nephrotic range proteinuria  - UP/cr ratio is 6220 mg/g - she is most likely nephrotic range proteinuria and CKD from her uncontrolled diabetes and thus decreased renal reserve  - serologic work-up with + ANA and SSB, 6 g proteinuria - Added PLA2R antibody (under misc [facility] test - sendout): negative - continue with torsemide  40mg  daily, can transition to OFF-HD days once stable on HD -s/p renal biopsy 12/15-diabetic kidney disease, aterionephrosclerosis. 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 Tolerated HD yesterday. Nausea better but still with very poor appetite. She does report that the swelling in her legs is better, has more feeling in her feet now. Net uf 1L   Objective:   BP 123/72   Pulse 97   Temp 97.8 F (36.6 C) (Oral)   Resp 19   Ht 5' 8 (1.727 m)   Wt 86.2 kg   SpO2 94%   BMI 28.89 kg/m   Intake/Output Summary (Last 24 hours) at 01/16/2024 1155 Last data filed at 01/15/2024 1927 Gross per 24 hour  Intake --  Output 1000 ml  Net -1000 ml    Weight change: 0.3 kg  Physical Exam: Gen: ill appearing, laying flat in bed, NAD HEENT: periorbital edema CVS: RRR Resp:nonlabored, normal wob Abd: soft Ext: + anasarca (trace) Neuro: awake, alert, no myoclonic jerks observed Dialysis access: [facility]  Imaging: IR TUNNELED CENTRAL VENOUS CATH [facility] W IMG Result Date: 01/15/2024 EXAM: ULTRASOUND GUIDED VASCULAR ACCESS. FLUOROSCOPY GUIDED PLACEMENT OF A TUNNELED CATHETER. MODERATE CONSCIOUS SEDATION 01/15/2024 12:55:00 PM SEDATION: 75 micrograms fentanyl , 2 milligrams of versed , 14 minutes. FLUOROSCOPY DOSE AND TYPE: Radiation Exposure Index: Reference Air Kerma (in mGy) = less than 0.1 mGy. TECHNIQUE: Informed consent was obtained after a detailed explanation of the  procedure including risks, benefits, and alternatives. All aspects of maximum sterile barrier technique were used including washing hands with conventional soap and water  or with alcohol-based hand rubs (ABHR), skin preparation, cap, mask, sterile gown, sterile gloves, and sterile full body drape. Local  anesthesia was achieved with lidocaine . A micropuncture needle was used to access the right internal jugular vein using ultrasound guidance. An ultrasound image demonstrating patency of the vein with needle tip located within it was obtained and stored in PACS. A 0.035 guidewire was used to place a peel-away sheath. A subcutaneous tunnel was created to the infraclavicular region and a tunneled Palindrome 19 hemodialysis catheter was pulled through the subcutaneous tunnel to the venotomy site and advanced through the peel-away sheath under fluoroscopic guidance to the right atrium. The catheter flushed easily and there was a good blood return. The catheter was sutured to the skin. The catheter was locked with heparinized saline. Periprocedural antibiotic coverage using vancomycin . The patient tolerated the procedure well and there were no immediate complications. COMPARISON: None available. CLINICAL HISTORY: AKI on CKD, now ESRD, needs   access for  hemodialysis. FINDINGS: Fluoroscopic image demonstrates the tip of the catheter in the right atrium. IMPRESSION: 1. Successful ultrasound and fluoroscopy guided placement of a tunneled Palindrome 19 hemodialysis catheter. Electronically signed by: Katheleen Faes MD 01/15/2024 03:17 PM EST RP Workstation: [id number]     Labs: BMET Recent Labs  Lab 01/10/24 0533 01/11/24 0231 01/12/24 0321 01/13/24 9682 01/14/24 0347 01/15/24 0227 01/16/24 0327  NA 137 138 142 141 140 139 138  K 4.6 4.7 4.8 5.2* 5.5* 5.6* 4.6  CL 107 109 111 111 111 110 105  CO2 22 20* 19* 21* 21* 21* 24  GLUCOSE 172* 143* 118* 100* 112* 135* 79  BUN 36* 34* 36* 34* 31* 33* 22*  CREATININE 4.52* 4.50* 4.47* 4.41* 4.43* 4.57* 3.66*  CALCIUM  8.1* 8.1* 8.3* 8.4* 8.5* 8.8* 8.1*  PHOS 5.3* 5.0*  --  4.5 4.3 4.8* 3.8   CBC Recent Labs  Lab 01/10/24 2326 01/12/24 0321 01/13/24 0317 01/15/24 0227 01/16/24 0327  WBC 7.5 8.3 7.0 7.7 6.0  NEUTROABS 4.8  --   --   --   --   HGB 7.6*  8.2* 7.5* 7.7* 7.7*  HCT 23.2* 26.6* 23.7* 23.9* 24.3*  MCV 84.7 85.8 84.6 86.9 84.7  PLT 161 128* 192 192 201    Medications:     acetaminophen   325 mg Oral Once   amLODipine   10 mg Oral Daily   aspirin  EC  81 mg Oral Daily   atorvastatin   40 mg Oral QHS   bisacodyl   10 mg Rectal Once   Chlorhexidine  Gluconate Cloth  6 each Topical Q0600   cyclobenzaprine   10 mg Oral BID   darbepoetin (ARANESP ) injection - DIALYSIS  100 mcg Subcutaneous Q Sun-1800   fluticasone  furoate-vilanterol  1 puff Inhalation Daily   gabapentin   100 mg Oral BID   heparin  injection (subcutaneous)  5,000 Units Subcutaneous Q8H   hydrALAZINE   100 mg Oral Q8H   insulin  aspart  0-5 Units Subcutaneous QHS   insulin  aspart  0-6 Units Subcutaneous TID WC   insulin  glargine  12 Units Subcutaneous Daily   iron  polysaccharides  150 mg Oral Daily   melatonin  3 mg Oral QHS   metoprolol  succinate  12.5 mg Oral Daily   montelukast   10 mg Oral QHS   pantoprazole   40 mg Oral Daily   polyethylene glycol  17 g Oral BID  senna-docusate  2 tablet Oral BID   sodium bicarbonate   650 mg Oral BID   torsemide   40 mg Oral Daily    Ephriam Stank, MD [facility] 01/16/2024, 11:55 AM

## 2024-01-17 DIAGNOSIS — E1100 Type 2 diabetes mellitus with hyperosmolarity without nonketotic hyperglycemic-hyperosmolar coma (NKHHC): Secondary | ICD-10-CM | POA: Diagnosis not present

## 2024-01-17 DIAGNOSIS — E785 Hyperlipidemia, unspecified: Secondary | ICD-10-CM | POA: Diagnosis not present

## 2024-01-17 DIAGNOSIS — I10 Essential (primary) hypertension: Secondary | ICD-10-CM | POA: Diagnosis not present

## 2024-01-17 DIAGNOSIS — N179 Acute kidney failure, unspecified: Secondary | ICD-10-CM | POA: Diagnosis not present

## 2024-01-17 LAB — GLUCOSE, CAPILLARY
Glucose-Capillary: 74 mg/dL (ref 70–99)
Glucose-Capillary: 104 mg/dL — ABNORMAL HIGH (ref 70–99)
Glucose-Capillary: 118 mg/dL — ABNORMAL HIGH (ref 70–99)
Glucose-Capillary: 128 mg/dL — ABNORMAL HIGH (ref 70–99)

## 2024-01-17 MED ORDER — OXIDIZED CELLULOSE EX PADS
1.0000 | MEDICATED_PAD | Freq: Once | CUTANEOUS | Status: AC
  Administered 2024-01-17: 1 via TOPICAL
  Filled 2024-01-17: qty 1

## 2024-01-17 MED ORDER — "THROMBI-PAD 3""X3"" EX PADS"
1.0000 | MEDICATED_PAD | Freq: Once | CUTANEOUS | Status: DC
  Filled 2024-01-17: qty 1

## 2024-01-17 MED ORDER — INSULIN GLARGINE 100 UNIT/ML ~~LOC~~ SOLN
8.0000 [IU] | Freq: Every day | SUBCUTANEOUS | Status: DC
  Administered 2024-01-17: 8 [IU] via SUBCUTANEOUS
  Filled 2024-01-17 (×2): qty 0.08

## 2024-01-17 MED ORDER — METOCLOPRAMIDE HCL 5 MG/ML IJ SOLN
10.0000 mg | Freq: Three times a day (TID) | INTRAMUSCULAR | Status: DC
  Administered 2024-01-17 – 2024-01-22 (×6): 10 mg via INTRAVENOUS
  Filled 2024-01-17 (×9): qty 2

## 2024-01-17 NOTE — Progress Notes (Signed)
 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  Also note nephrotic range proteinuria, microscopic hematuria, and anasarca - concerning for possible GN - hold lisinopril , continue with torsemide  40 mg daily, plan to transition this to off-HD days once stable on HD -no reversibility on renal biopsy as below. Started on HD after our discussion post biopsy -pending TDC placement for today. HD#1 12/19. HD#2 12/20. #3 planned for tomorrow -consult [facility]VS on Monday for perm access creation  -CLIP: renal navigator following -Avoid nephrotoxic medications including NSAIDs and iodinated intravenous contrast exposure unless the latter is absolutely indicated.  Preferred narcotic agents for pain control are hydromorphone , fentanyl , and methadone. Morphine  should not be used. Avoid Baclofen and avoid oral sodium phosphate and magnesium  citrate based laxatives / bowel preps. Continue strict Input and Output monitoring. Will monitor the patient closely with you and intervene or adjust therapy as indicated by changes in clinical status/labs   # Nephrotic range proteinuria  - UP/cr ratio is 6220 mg/g - she is most likely nephrotic range proteinuria and CKD from her uncontrolled diabetes and thus decreased renal reserve  - serologic work-up with + ANA and SSB, 6 g proteinuria - Added PLA2R antibody (under misc [facility] test - sendout): negative - continue with torsemide  40mg  daily, can transition to OFF-HD days once stable on HD -s/p renal biopsy 12/15-diabetic kidney disease, aterionephrosclerosis. 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), 12 out of 21 gloms globally sclerotic--no reversibility  # N/V -suspect multifactorial: gastroparesis and uremia -bowel regimen ordered per primary service -HD plan as above, I believe this is mostly related to  gastroparesis  #Vision changes -chronic issue likely (diabetic retinopathy) -per primary   # Metabolic acidosis - improved, managing with HD   # HTN  - BP stable -UF as tolerated with HD   # Normocytic anemia  - may have had component from CKD - iron  deficiency - continue oral iron  -Aranesp  100 mcg weekly (01/10/24) -hgb up to 7.7 -transfuse prn for hgb <7  # Hyperkalemia -improved with HD     Subjective:    Seen in room. Tolerated HD yesterday. Has been having vision issues--comes and goes. Working on appetite and food. Still with nausea   Objective:   BP (!) 151/91   Pulse 99   Temp 98.1 F (36.7 C) (Oral)   Resp 16   Ht 5' 8 (1.727 m)   Wt 84.6 kg   SpO2 94%   BMI 28.36 kg/m   Intake/Output Summary (Last 24 hours) at 01/17/2024 1011 Last data filed at 01/16/2024 1742 Gross per 24 hour  Intake --  Output 2000 ml  Net -2000 ml    Weight change: -0.3 kg  Physical Exam: Gen: ill appearing, laying flat in bed, NAD HEENT: periorbital edema CVS: RRR Resp:nonlabored, normal wob Abd: soft Ext: + anasarca (trace) Neuro: awake, alert, no myoclonic jerks observed Dialysis access: [facility]  Imaging: IR TUNNELED CENTRAL VENOUS CATH [facility] W IMG Result Date: 01/15/2024 EXAM: ULTRASOUND GUIDED VASCULAR ACCESS. FLUOROSCOPY GUIDED PLACEMENT OF A TUNNELED CATHETER. MODERATE CONSCIOUS SEDATION 01/15/2024 12:55:00 PM SEDATION: 75 micrograms fentanyl , 2 milligrams of versed , 14 minutes. FLUOROSCOPY DOSE AND TYPE: Radiation Exposure Index: Reference Air Kerma (in mGy) = less than 0.1 mGy. TECHNIQUE: Informed consent was obtained after a detailed explanation of the procedure including risks, benefits, and alternatives. All aspects of maximum sterile barrier technique  were used including washing hands with conventional soap and water  or with alcohol-based hand rubs (ABHR), skin preparation, cap, mask, sterile gown, sterile gloves, and sterile full body drape. Local anesthesia was  achieved with lidocaine . A micropuncture needle was used to access the right internal jugular vein using ultrasound guidance. An ultrasound image demonstrating patency of the vein with needle tip located within it was obtained and stored in PACS. A 0.035 guidewire was used to place a peel-away sheath. A subcutaneous tunnel was created to the infraclavicular region and a tunneled Palindrome 19 hemodialysis catheter was pulled through the subcutaneous tunnel to the venotomy site and advanced through the peel-away sheath under fluoroscopic guidance to the right atrium. The catheter flushed easily and there was a good blood return. The catheter was sutured to the skin. The catheter was locked with heparinized saline. Periprocedural antibiotic coverage using vancomycin . The patient tolerated the procedure well and there were no immediate complications. COMPARISON: None available. CLINICAL HISTORY: AKI on CKD, now ESRD, needs   access for  hemodialysis. FINDINGS: Fluoroscopic image demonstrates the tip of the catheter in the right atrium. IMPRESSION: 1. Successful ultrasound and fluoroscopy guided placement of a tunneled Palindrome 19 hemodialysis catheter. Electronically signed by: Katheleen Faes MD 01/15/2024 03:17 PM EST RP Workstation: [id number]     Labs: BMET Recent Labs  Lab 01/11/24 0231 01/12/24 0321 01/13/24 9682 01/14/24 0347 01/15/24 0227 01/16/24 0327  NA 138 142 141 140 139 138  K 4.7 4.8 5.2* 5.5* 5.6* 4.6  CL 109 111 111 111 110 105  CO2 20* 19* 21* 21* 21* 24  GLUCOSE 143* 118* 100* 112* 135* 79  BUN 34* 36* 34* 31* 33* 22*  CREATININE 4.50* 4.47* 4.41* 4.43* 4.57* 3.66*  CALCIUM  8.1* 8.3* 8.4* 8.5* 8.8* 8.1*  PHOS 5.0*  --  4.5 4.3 4.8* 3.8   CBC Recent Labs  Lab 01/10/24 2326 01/12/24 0321 01/13/24 0317 01/15/24 0227 01/16/24 0327  WBC 7.5 8.3 7.0 7.7 6.0  NEUTROABS 4.8  --   --   --   --   HGB 7.6* 8.2* 7.5* 7.7* 7.7*  HCT 23.2* 26.6* 23.7* 23.9* 24.3*  MCV 84.7 85.8  84.6 86.9 84.7  PLT 161 128* 192 192 201    Medications:     acetaminophen   325 mg Oral Once   amLODipine   10 mg Oral Daily   aspirin  EC  81 mg Oral Daily   atorvastatin   40 mg Oral QHS   bisacodyl   10 mg Rectal Once   Chlorhexidine  Gluconate Cloth  6 each Topical Q0600   cyclobenzaprine   10 mg Oral BID   darbepoetin (ARANESP ) injection - DIALYSIS  100 mcg Subcutaneous Q Sun-1800   fluticasone  furoate-vilanterol  1 puff Inhalation Daily   gabapentin   100 mg Oral BID   heparin  injection (subcutaneous)  5,000 Units Subcutaneous Q8H   hydrALAZINE   100 mg Oral Q8H   insulin  aspart  0-6 Units Subcutaneous TID WC   insulin  glargine  8 Units Subcutaneous Daily   iron  polysaccharides  150 mg Oral Daily   melatonin  3 mg Oral QHS   metoCLOPramide  (REGLAN ) injection  10 mg Intravenous TID AC   metoprolol  succinate  12.5 mg Oral Daily   montelukast   10 mg Oral QHS   pantoprazole   40 mg Oral Daily   polyethylene glycol  17 g Oral BID   senna-docusate  2 tablet Oral BID   sodium bicarbonate   650 mg Oral BID   torsemide   40  mg Oral Daily    Ephriam Stank, MD [name] 01/17/2024, 10:11 AM

## 2024-01-17 NOTE — Progress Notes (Signed)
 [facility] night cross cover note:   I was notified by the patient's RN that the there has been some slow bleeding from the patient's right IJ hemodialysis catheter site.  RN conveys that the IV team HD cath site earlier this evening at which time they reinforced the dressing.  IV team is in the process of reassessing the right IJ HD cath site with plans to redress the site, if needed.   I have placed order for thrombin  pad to be applied to right IJ hemodialysis catheter site to help establish hemostasis.   Will hold next dose of subcu heparin , ordered for VTE prophylaxis. It is noted that the patient refused her scheduled dose of subcu heparin  at 2200 last evening.     Eva Pore, DO Hospitalist

## 2024-01-17 NOTE — Progress Notes (Signed)
 "                        PROGRESS NOTE        PATIENT DETAILS Name: Sara [name] Age: 48 y.o. Sex: female Date of Birth: 12-19-1975 Admit Date: 12/30/2023 Admitting Physician Madison DELENA Peaches, MD ERE:[email or web address], Ronalee, MD  Brief Summary: Patient is a 48 y.o.  female with history of DM-2-who initially presented to the hospital with suicidal evaluation and hyperosmolar nonketotic hyperglycemia.  Hospital course complicated by AKI-s/p renal biopsy 12/15.  Significant events: 12/4>> admit to [facility] 12/15>> renal biopsy 12/19>> started HD  Significant studies: 12/5>> renal ultrasound: No hydronephrosis  Significant microbiology data: None  Procedures: 12/15>> renal biopsy (diabetic kidney disease, aterionephrosclerosis. 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  Objective: Vitals: Blood pressure (!) 151/91, pulse 99, temperature 98.1 F (36.7 C), temperature source Oral, resp. rate 16, height 5' 8 (1.727 m), weight 84.6 kg, SpO2 94%.   Exam: Gen Exam:Alert awake-not in any distress HEENT:atraumatic, normocephalic Chest: B/L clear to auscultation anteriorly CVS:S1S2 regular Abdomen:soft non tender, non distended Extremities:no edema Neurology: Non focal Skin: no rash  Pertinent Labs/Radiology:    Latest Ref Rng & Units 01/16/2024    3:27 AM 01/15/2024    2:27 AM 01/13/2024    3:17 AM  CBC  WBC 4.0 - 10.5 K/uL 6.0  7.7  7.0   Hemoglobin 12.0 - 15.0 g/dL 7.7  7.7  7.5   Hematocrit 36.0 - 46.0 % 24.3  23.9  23.7   Platelets 150 - 400 K/uL 201  192  192     Lab Results  Component Value Date   NA 138 01/16/2024   K 4.6 01/16/2024   CL 105 01/16/2024   CO2 24 01/16/2024      Assessment/Plan: Hyperosmolar nonketotic  hyperglycemia Resolved with IVF/IV fluids  DM-2 with uncontrolled hyperglycemia (A1c 8.6 on 9/9) CBG on the lower side-but has poor appetite secondary to nausea Decrease Lantus -continue SSI   Recent Labs    01/16/24 1823 01/16/24 2123 01/17/24 0732  GLUCAP 90 124* 74     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  Nephrology following and directing care.  Hyperkalemia Solved after HD-can stop Lokelma .  Nausea Suspicion that this may be related to AKI-however continues in spite of HD x 2 Suspect she may have a component of gastroparesis Reglan  IV started Continue with as needed Zofran /Phenergan  for intractable nausea/vomiting Note-having a BM almost on a daily basis-abdomen nondistended-soft on exam.  Multifactorial anemia Secondary to underlying CKD/critical illness/iron  deficiency Continued iron  supplementation S/p 1 unit of PRBC on 12/6 Follow CBC periodically  HTN BP relatively stable Continue amlodipine /metoprolol  (does not want to take Coreg )  Follow/optimize.  HLD Statin  GERD PPI  Suicidal ideation Recurrent major depressive disorder Evaluated by psychiatry-initially inpatient psychiatry was recommended-however due to prolonged hospitalization-her symptoms have improved-psychiatry no longer recommending inpatient admission-bedside sitter has been discontinued Continue trazodone  for sleep Needs outpatient follow-up with psychiatry  Blurry vision On and off for the past-sounds like more of a chronic issue She does see better when she places her glasses on-could have been worse  yesterday due to hypoglycemic spells Will watch closely-if need be-Will touch base with ophthalmology.  Homelessness TOC evaluation prior to discharge  Code status:   Code Status: Full Code   DVT  Prophylaxis: heparin  injection 5,000 Units Start: 01/11/24 2200   Family Communication: None at bedside   Disposition Plan: Status is: Inpatient Remains inpatient appropriate because: Severity of illness   Planned Discharge Destination:Home   Diet: Diet Order             Diet renal/carb modified with fluid restriction Diet-HS Snack? Nothing; Fluid restriction: 1500 mL Fluid; Room service appropriate? Yes; Fluid consistency: Thin  Diet effective now                     Antimicrobial agents: Anti-infectives (From admission, onward)    None        MEDICATIONS: Scheduled Meds:  acetaminophen   325 mg Oral Once   amLODipine   10 mg Oral Daily   aspirin  EC  81 mg Oral Daily   atorvastatin   40 mg Oral QHS   bisacodyl   10 mg Rectal Once   Chlorhexidine  Gluconate Cloth  6 each Topical Q0600   cyclobenzaprine   10 mg Oral BID   darbepoetin (ARANESP ) injection - DIALYSIS  100 mcg Subcutaneous Q Sun-1800   fluticasone  furoate-vilanterol  1 puff Inhalation Daily   gabapentin   100 mg Oral BID   heparin  injection (subcutaneous)  5,000 Units Subcutaneous Q8H   hydrALAZINE   100 mg Oral Q8H   insulin  aspart  0-6 Units Subcutaneous TID WC   insulin  glargine  8 Units Subcutaneous Daily   iron  polysaccharides  150 mg Oral Daily   melatonin  3 mg Oral QHS   metoCLOPramide  (REGLAN ) injection  10 mg Intravenous TID AC   metoprolol  succinate  12.5 mg Oral Daily   montelukast   10 mg Oral QHS   pantoprazole   40 mg Oral Daily   polyethylene glycol  17 g Oral BID   senna-docusate  2 tablet Oral BID   sodium bicarbonate   650 mg Oral BID   torsemide   40 mg Oral Daily   Continuous Infusions:  promethazine  (PHENERGAN ) injection (IM or IVPB) 150 mL/hr at 01/14/24 1524   PRN Meds:.acetaminophen  **OR** acetaminophen , albuterol , cyclobenzaprine , dextrose , hydrALAZINE , LORazepam , ondansetron  **OR** ondansetron  (ZOFRAN ) IV, oxyCODONE -acetaminophen , promethazine  (PHENERGAN ) injection (IM or  IVPB), sodium chloride , traZODone    I have personally reviewed following labs and imaging studies  LABORATORY DATA: CBC: Recent Labs  Lab 01/10/24 2326 01/12/24 0321 01/13/24 0317 01/15/24 0227 01/16/24 0327  WBC 7.5 8.3 7.0 7.7 6.0  NEUTROABS 4.8  --   --   --   --   HGB 7.6* 8.2* 7.5* 7.7* 7.7*  HCT 23.2* 26.6* 23.7* 23.9* 24.3*  MCV 84.7 85.8 84.6 86.9 84.7  PLT 161 128* 192 192 201    Basic Metabolic Panel: Recent Labs  Lab 01/11/24 0231 01/12/24 0321 01/13/24 0317 01/14/24 0347 01/15/24 0227 01/16/24 0327  NA 138 142 141 140 139 138  K 4.7 4.8 5.2* 5.5* 5.6* 4.6  CL 109 111 111 111 110 105  CO2 20* 19* 21* 21* 21* 24  GLUCOSE 143* 118* 100* 112* 135* 79  BUN 34* 36* 34* 31* 33* 22*  CREATININE 4.50* 4.47* 4.41* 4.43* 4.57* 3.66*  CALCIUM  8.1* 8.3* 8.4* 8.5* 8.8* 8.1*  PHOS 5.0*  --  4.5 4.3 4.8* 3.8    GFR: Estimated Creatinine Clearance: 21.2 mL/min (A) (by C-G formula based on SCr of 3.66 mg/dL (H)).  Liver Function Tests: Recent Labs  Lab 01/11/24 0231 01/13/24 0317 01/14/24 0347 01/15/24 0227 01/16/24 0327  ALBUMIN  2.3* 2.8* 2.8* 2.7* 2.6*   No results for input(s): LIPASE, AMYLASE in the last 168 hours. No results for input(s): AMMONIA in the last 168 hours.  Coagulation Profile: No results for input(s): INR, PROTIME in the last 168 hours.   Cardiac Enzymes: No results for input(s): CKTOTAL, CKMB, CKMBINDEX, TROPONINI in the last 168 hours.  BNP (last 3 results) No results for input(s): PROBNP in the last 8760 hours.  Lipid Profile: No results for input(s): CHOL, HDL, LDLCALC, TRIG, CHOLHDL, LDLDIRECT in the last 72 hours.  Thyroid Function Tests: No results for input(s): TSH, T4TOTAL, FREET4, T3FREE, THYROIDAB in the last 72 hours.  Anemia Panel: No results for input(s): VITAMINB12, FOLATE, FERRITIN, TIBC, IRON , RETICCTPCT in the last 72 hours.  Urine analysis:    Component  Value Date/Time   COLORURINE YELLOW 01/01/2024 0943   APPEARANCEUR HAZY (A) 01/01/2024 0943   LABSPEC 1.011 01/01/2024 0943   PHURINE 6.0 01/01/2024 0943   GLUCOSEU >=500 (A) 01/01/2024 0943   HGBUR MODERATE (A) 01/01/2024 0943   BILIRUBINUR NEGATIVE 01/01/2024 0943   KETONESUR NEGATIVE 01/01/2024 0943   PROTEINUR >=300 (A) 01/01/2024 0943   UROBILINOGEN 0.2 12/11/2009 1249   NITRITE NEGATIVE 01/01/2024 0943   LEUKOCYTESUR NEGATIVE 01/01/2024 0943    Sepsis Labs: Lactic Acid, Venous No results found for: LATICACIDVEN  MICROBIOLOGY: No results found for this or any previous visit (from the past 240 hours).  RADIOLOGY STUDIES/RESULTS: IR TUNNELED CENTRAL VENOUS CATH [facility] W IMG Result Date: 01/15/2024 EXAM: ULTRASOUND GUIDED VASCULAR ACCESS. FLUOROSCOPY GUIDED PLACEMENT OF A TUNNELED CATHETER. MODERATE CONSCIOUS SEDATION 01/15/2024 12:55:00 PM SEDATION: 75 micrograms fentanyl , 2 milligrams of versed , 14 minutes. FLUOROSCOPY DOSE AND TYPE: Radiation Exposure Index: Reference Air Kerma (in mGy) = less than 0.1 mGy. TECHNIQUE: Informed consent was obtained after a detailed explanation of the procedure including risks, benefits, and alternatives. All aspects of maximum sterile barrier technique were used including washing hands with conventional soap and water  or with alcohol-based hand rubs (ABHR), skin preparation, cap, mask, sterile gown, sterile gloves, and sterile full body drape. Local anesthesia was achieved with lidocaine . A micropuncture needle was used to access the right internal jugular vein using ultrasound guidance. An ultrasound image demonstrating patency of the vein with needle tip located within it was obtained and stored in PACS. A 0.035 guidewire was used to place a peel-away sheath. A subcutaneous tunnel was created to the infraclavicular region and a tunneled Palindrome 19 hemodialysis catheter was pulled through the subcutaneous tunnel to the venotomy site and advanced  through the peel-away sheath under fluoroscopic guidance to the right atrium. The catheter flushed easily and there was a good blood return. The catheter was sutured to the skin. The catheter was locked with heparinized saline. Periprocedural antibiotic coverage using vancomycin . The patient tolerated the procedure well and there were no immediate complications. COMPARISON: None available. CLINICAL HISTORY: AKI on CKD, now ESRD, needs   access for  hemodialysis. FINDINGS: Fluoroscopic image demonstrates the tip of the catheter in the right atrium. IMPRESSION: 1. Successful ultrasound and fluoroscopy guided placement of a tunneled Palindrome 19 hemodialysis catheter. Electronically signed by: Katheleen Faes MD 01/15/2024 03:17 PM EST RP Workstation: [id number]      LOS: 17 days   Donalda Applebaum, MD  [facility]    To contact the attending provider between 7A-7P or the covering provider during after hours 7P-7A, please  log into the web site [email or web address] and access using universal [place] password for that web site. If you do not have the password, please call the hospital operator.  01/17/2024, 10:00 AM    "

## 2024-01-17 NOTE — Plan of Care (Signed)

## 2024-01-18 ENCOUNTER — Inpatient Hospital Stay (HOSPITAL_COMMUNITY): Payer: MEDICAID

## 2024-01-18 ENCOUNTER — Encounter (HOSPITAL_COMMUNITY): Payer: Self-pay

## 2024-01-18 DIAGNOSIS — N179 Acute kidney failure, unspecified: Secondary | ICD-10-CM | POA: Diagnosis not present

## 2024-01-18 DIAGNOSIS — Z992 Dependence on renal dialysis: Secondary | ICD-10-CM | POA: Diagnosis not present

## 2024-01-18 DIAGNOSIS — E785 Hyperlipidemia, unspecified: Secondary | ICD-10-CM | POA: Diagnosis not present

## 2024-01-18 DIAGNOSIS — N186 End stage renal disease: Secondary | ICD-10-CM | POA: Diagnosis not present

## 2024-01-18 DIAGNOSIS — I10 Essential (primary) hypertension: Secondary | ICD-10-CM | POA: Diagnosis not present

## 2024-01-18 DIAGNOSIS — N189 Chronic kidney disease, unspecified: Secondary | ICD-10-CM | POA: Diagnosis not present

## 2024-01-18 DIAGNOSIS — E1100 Type 2 diabetes mellitus with hyperosmolarity without nonketotic hyperglycemic-hyperosmolar coma (NKHHC): Secondary | ICD-10-CM | POA: Diagnosis not present

## 2024-01-18 HISTORY — PX: IR PATIENT EVAL TECH 0-60 MINS: IMG5564

## 2024-01-18 LAB — CBC
HCT: 24.2 % — ABNORMAL LOW (ref 36.0–46.0)
Hemoglobin: 7.8 g/dL — ABNORMAL LOW (ref 12.0–15.0)
MCH: 27.1 pg (ref 26.0–34.0)
MCHC: 32.2 g/dL (ref 30.0–36.0)
MCV: 84 fL (ref 80.0–100.0)
Platelets: 210 K/uL (ref 150–400)
RBC: 2.88 MIL/uL — ABNORMAL LOW (ref 3.87–5.11)
RDW: 12.8 % (ref 11.5–15.5)
WBC: 6.4 K/uL (ref 4.0–10.5)
nRBC: 0 % (ref 0.0–0.2)

## 2024-01-18 LAB — RENAL FUNCTION PANEL
Albumin: 2.7 g/dL — ABNORMAL LOW (ref 3.5–5.0)
Anion gap: 8 (ref 5–15)
BUN: 19 mg/dL (ref 6–20)
CO2: 26 mmol/L (ref 22–32)
Calcium: 8.5 mg/dL — ABNORMAL LOW (ref 8.9–10.3)
Chloride: 103 mmol/L (ref 98–111)
Creatinine, Ser: 3.66 mg/dL — ABNORMAL HIGH (ref 0.44–1.00)
GFR, Estimated: 14 mL/min — ABNORMAL LOW
Glucose, Bld: 77 mg/dL (ref 70–99)
Phosphorus: 3.8 mg/dL (ref 2.5–4.6)
Potassium: 4.4 mmol/L (ref 3.5–5.1)
Sodium: 137 mmol/L (ref 135–145)

## 2024-01-18 LAB — GLUCOSE, CAPILLARY
Glucose-Capillary: 83 mg/dL (ref 70–99)
Glucose-Capillary: 110 mg/dL — ABNORMAL HIGH (ref 70–99)
Glucose-Capillary: 136 mg/dL — ABNORMAL HIGH (ref 70–99)
Glucose-Capillary: 144 mg/dL — ABNORMAL HIGH (ref 70–99)

## 2024-01-18 LAB — HEPATITIS B SURFACE ANTIBODY, QUANTITATIVE: Hep B S AB Quant (Post): <3.5 m[IU]/mL — ABNORMAL LOW

## 2024-01-18 MED ORDER — OXIDIZED CELLULOSE EX PADS
1.0000 | MEDICATED_PAD | Freq: Once | CUTANEOUS | Status: DC
  Filled 2024-01-18: qty 1

## 2024-01-18 MED ORDER — OXYCODONE-ACETAMINOPHEN 5-325 MG PO TABS
ORAL_TABLET | ORAL | Status: AC
  Filled 2024-01-18: qty 2

## 2024-01-18 MED ORDER — HYDROMORPHONE HCL 1 MG/ML IJ SOLN
0.5000 mg | Freq: Once | INTRAMUSCULAR | Status: AC
  Administered 2024-01-18: 0.5 mg via INTRAVENOUS
  Filled 2024-01-18: qty 0.5

## 2024-01-18 MED ORDER — HEPARIN SODIUM (PORCINE) 1000 UNIT/ML IJ SOLN
INTRAMUSCULAR | Status: AC
  Filled 2024-01-18: qty 4

## 2024-01-18 MED ORDER — HEPARIN SODIUM (PORCINE) 1000 UNIT/ML IJ SOLN
3200.0000 [IU] | Freq: Once | INTRAMUSCULAR | Status: AC
  Administered 2024-01-18: 3200 [IU]

## 2024-01-18 MED ORDER — INSULIN GLARGINE 100 UNIT/ML ~~LOC~~ SOLN
6.0000 [IU] | Freq: Every day | SUBCUTANEOUS | Status: AC
  Administered 2024-01-18 – 2024-01-21 (×4): 6 [IU] via SUBCUTANEOUS
  Filled 2024-01-18 (×4): qty 0.06

## 2024-01-18 NOTE — Progress Notes (Signed)
 Informed Nephrologist, Dr. Macel of bleeding issues with catheter.  Dr. Macel says will consult with IR.

## 2024-01-18 NOTE — Consult Note (Addendum)
 [facility] Consult    Reason for Consult:  Hemodialysis access Requesting Physician:  Dr. Macel MRN #:  [id number]  History of Present Illness: This is a 48 y.o. female with past medical history significant for non compliant Type II DM, HTN, HLD, OSA, GERD, Depression, and CKD stage IV presenting with hyperglycemia and hypovolemia. She is now felt to have progressed to ESRD. Interventional radiology placed a right internal jugular TDC on 12/19. She has now had 3 HD session since her admission. Vascular surgery was consulted for permanent dialysis access placement. She reports never having been on hemodialysis before. No prior access surgeries. No prior central lines, PICC line, pacemaker. She is left hand dominant however she has history of prior trauma to her left upper extremity so reports using her right arm more.   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 pylori infection    High risk medication use    Hypercholesterolemia    Hyperlipidemia, mixed    Hypertension    Other mixed anxiety disorders    Pollen allergies    Sickle cell trait    Sleep apnea    Vitamin D deficiency     Past Surgical History:  Procedure Laterality Date   ABDOMINAL HYSTERECTOMY     APPENDECTOMY     COLONOSCOPY  08-16-2008   Dr. Princella   ESOPHAGOGASTRODUODENOSCOPY  08-16-2008   Dr. Anette    IR TUNNELED CENTRAL VENOUS CATH [facility] W IMG  01/15/2024    Allergies[1]  Prior to Admission medications  Medication Sig Start Date End Date Taking? Authorizing Provider  acetaminophen  (TYLENOL ) 650 MG CR tablet Take 650-1,300 mg by mouth daily as needed for pain.   Yes [provider]  ADVAIR DISKUS 250-50 MCG/ACT AEPB Inhale 1 puff into the lungs in the morning and at bedtime. 11/28/23  Yes [provider]  albuterol   (VENTOLIN  HFA) 108 (90 Base) MCG/ACT inhaler Inhale 2 puffs into the lungs every 6 (six) hours as needed for wheezing or shortness of breath. 10/09/23  Yes Emokpae, Courage, MD  amLODipine  (NORVASC ) 10 MG tablet Take 1 tablet (10 mg total) by mouth daily. 10/10/23  Yes Emokpae, Courage, MD  atorvastatin  (LIPITOR) 40 MG tablet Take 1 tablet (40 mg total) by mouth daily. Patient taking differently: Take 40 mg by mouth at bedtime. 01/12/23  Yes Shafer, Jorene, NP  bisacodyl  5 MG EC tablet Take 5 mg by mouth daily as needed for mild constipation.   Yes [provider]  gabapentin  (NEURONTIN ) 300 MG capsule Take 2 capsules (600 mg total) by mouth 3 (three) times daily. Patient taking differently: Take 600 mg by mouth 2 (two) times daily as needed (for diabetic neuropathy). 10/21/22 12/31/23 Yes Ntuen, Ellouise BROCKS, FNP  ipratropium-albuterol  (DUONEB) 0.5-2.5 (3) MG/3ML SOLN Take 3 mLs by nebulization every 6 (six) hours as needed. 11/27/23  Yes [provider]  metoprolol  succinate (TOPROL -XL) 25 MG 24 hr tablet Take 0.5 tablets (12.5 mg total) by mouth daily. 10/09/23  Yes Emokpae, Courage, MD  montelukast  (SINGULAIR ) 10 MG tablet Take 1 tablet (10 mg total) by mouth at bedtime. 10/09/23  Yes Emokpae, Courage, MD  omeprazole  (PRILOSEC) 40 MG capsule Take 1 capsule (40 mg total) by mouth daily. 10/09/23  Yes Emokpae, Courage,  MD  ondansetron  (ZOFRAN -ODT) 4 MG disintegrating tablet Take 4 mg by mouth every 6 (six) hours as needed. 12/06/23  Yes [provider]  aspirin  EC 81 MG tablet Take 1 tablet (81 mg total) by mouth daily with breakfast. Swallow whole. Patient not taking: Reported on 12/31/2023 10/09/23   Pearlean Manus, MD  azithromycin  (ZITHROMAX ) 500 MG tablet Take 500 mg by mouth daily. Patient not taking: Reported on 12/31/2023 12/21/23   [provider]  cyclobenzaprine  (FLEXERIL ) 10 MG tablet Take 10 mg by mouth 2 (two) times daily. Patient not taking: Reported on 12/31/2023  08/12/23   [provider]  fluticasone  furoate-vilanterol (BREO ELLIPTA ) 100-25 MCG/ACT AEPB Inhale 1 puff into the lungs daily. Patient not taking: Reported on 12/31/2023 10/10/23   Pearlean Manus, MD  insulin  aspart (NOVOLOG ) 100 UNIT/ML injection Inject 0-15 Units into the skin 3 (three) times daily with meals. Patient not taking: Reported on 12/31/2023 10/21/22   Raye Ellouise BROCKS, FNP  LANTUS  SOLOSTAR 100 UNIT/ML Solostar Pen Inject 15 Units into the skin at bedtime. Patient not taking: Reported on 12/31/2023 11/30/23   [provider]  lisinopril  (ZESTRIL ) 20 MG tablet Take 1 tablet (20 mg total) by mouth daily. Patient not taking: Reported on 12/31/2023 10/09/23   Pearlean Manus, MD  senna-docusate (COLACE 2-IN-1) 8.6-50 MG tablet Take 1 tablet by mouth daily as needed (constipation). Patient not taking: Reported on 12/31/2023    [provider]    Social History   Socioeconomic History   Marital status: Divorced    Spouse name: Not on file   Number of children: 2   Years of education: Not on file   Highest education level: Not on file  Occupational History   Occupation: n/a  Tobacco Use   Smoking status: Every Day    Current packs/day: 1.00    Types: Cigarettes   Smokeless tobacco: Never   Tobacco comments:    Will order nicotine  gum  Vaping Use   Vaping status: Never Used  Substance and Sexual Activity   Alcohol use: Not Currently   Drug use: Not Currently    Types: Marijuana, Cocaine    Comment: 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   Sexual activity: Not Currently  Other Topics Concern   Not on file  Social History Narrative   Caffiene 1 cup daily   Living shelter in [place], [place]   Social Drivers of Health   Tobacco Use: High Risk (01/07/2024)   Patient History    Smoking Tobacco Use: Every Day    Smokeless Tobacco Use: Never    Passive Exposure: Not on file  Financial Resource Strain: At Risk (03/16/2023)   Received from [employer]    Hard to pay for: Food: 2  Food Insecurity: Food Insecurity Present (01/12/2024)   Epic    Worried About Programme Researcher, Broadcasting/film/video in the Last Year: Sometimes true    Ran Out of Food in the Last Year: Sometimes true  Transportation Needs: No Transportation Needs (01/12/2024)   Epic    Lack of Transportation (Medical): No    Lack of Transportation (Non-Medical): No  Recent Concern: Transportation Needs - Unmet Transportation Needs (12/31/2023)   Epic    Lack of Transportation (Medical): Yes    Lack of Transportation (Non-Medical): Yes  Physical Activity: Not at Risk (03/16/2023)   Received from [facility]   Physical Activity    Weekly Physical Activity: 1  Stress: Not at Risk (  03/16/2023)   Received [facility]   Stress    Do you feel these kinds of stress these days?: 1  Social Connections: At Risk (03/16/2023)   Received from [facility]   Social Connections    How often do you feel lonely or isolated from those around you? : 2  Intimate Partner Violence: Not At Risk (12/31/2023)   Epic    Fear of Current or Ex-Partner: No    Emotionally Abused: No    Physically Abused: No    Sexually Abused: No  Depression (PHQ2-9): Not on file  Alcohol Screen: Low Risk (10/14/2022)   Alcohol Screen    Last Alcohol Screening Score (AUDIT): 3  Housing: High Risk (01/12/2024)   Epic    Unable to Pay for Housing in the Last Year: No    Number of Times Moved in the Last Year: Not on file    Homeless in the Last Year: Yes  Utilities: Not At Risk (12/31/2023)   Epic    Threatened with loss of utilities: No  Health Literacy: Not on file     Family History  Problem Relation Age of Onset   Clotting disorder Mother    Crohn's disease Mother    Heart disease Mother    Colon cancer Father    Clotting disorder Brother    Diabetes Brother    Diabetes Maternal Aunt    Liver cancer Maternal Uncle    Prostate cancer Maternal Uncle    Colon cancer Maternal Uncle    Diabetes Paternal  Aunt    Esophageal cancer Paternal Uncle     ROS: Otherwise negative unless mentioned in HPI  Physical Examination  Vitals:   01/18/24 0930 01/18/24 1000  BP: 135/84 123/71  Pulse: (!) 114 (!) 108  Resp: 19 14  Temp:    SpO2: 99% 98%   Body mass index is 27.72 kg/m.  General:  WDWN in NAD Gait: Not observed HENT: WNL, normocephalic Pulmonary: normal non-labored breathing Cardiac: regular Vascular Exam/Pulses: 2+ radial pulses, hands warm and well perfused. 5/5 grip strength bilaterally. Scars on left forearm/upper arm Extremities: without ischemic changes, without Gangrene , without cellulitis; without open wounds;  Musculoskeletal: no muscle wasting or atrophy  Neurologic: A&O X 3;  No focal weakness or paresthesias are detected; speech is fluent/normal Psychiatric:  The pt has Normal affect. Lymph:  Unremarkable  CBC    Component Value Date/Time   WBC 6.4 01/18/2024 0320   RBC 2.88 (L) 01/18/2024 0320   HGB 7.8 (L) 01/18/2024 0320   HCT 24.2 (L) 01/18/2024 0320   PLT 210 01/18/2024 0320   MCV 84.0 01/18/2024 0320   MCH 27.1 01/18/2024 0320   MCHC 32.2 01/18/2024 0320   RDW 12.8 01/18/2024 0320   LYMPHSABS 1.9 01/10/2024 2326   MONOABS 0.7 01/10/2024 2326   EOSABS 0.1 01/10/2024 2326   BASOSABS 0.0 01/10/2024 2326    BMET    Component Value Date/Time   NA 137 01/18/2024 0320   K 4.4 01/18/2024 0320   CL 103 01/18/2024 0320   CO2 26 01/18/2024 0320   GLUCOSE 77 01/18/2024 0320   BUN 19 01/18/2024 0320   CREATININE 3.66 (H) 01/18/2024 0320   CALCIUM  8.5 (L) 01/18/2024 0320   GFRNONAA 14 (L) 01/18/2024 0320   GFRAA  12/11/2009 1210    >60        The eGFR has been calculated using the MDRD equation. This calculation has not been validated in all clinical situations. eGFR's persistently <  60 mL/min signify possible Chronic Kidney Disease.    COAGS: Lab Results  Component Value Date   INR 0.9 01/08/2024     Non-Invasive Vascular Imaging:    Bilateral upper extremity vein mapping pending  Statin:  Yes.   Beta Blocker:  Yes.   Aspirin :  Yes.   ACEI:  Yes.   ARB:  No. CCB use:  Yes Other antiplatelets/anticoagulants:  No.    ASSESSMENT/PLAN: This is a 48 y.o. female with CKD IV progressed to ESRD now on hemodialysis who vascular was consulted for permanent dialysis access. She was seen in HD unit. She has right internal jugular TDC placed by IR. She has no prior access history. She is left handed however from prior trauma she uses right hand more. Vein mapping ordered by has not been completed. I discussed with her fistula vs graft. Pending her duplex we can make recommendations on which arm however I discussed that may actually be best to go in her left upper extremity since she has some limitation on that side. We will follow up to discuss surgery options with her following her vein mapping. The on call vascular surgeon, Dr. Gretta will see her later to provide further surgical planning details   Teretha Charlyne RIGGERS [facility] [phone] 01/18/2024  10:29 AM  I have seen and evaluated the patient. I agree with the PA note as documented above.  48 year old female that vascular surgery has been consulted for permanent dialysis access.  Patient has a right IJ [facility] and is currently getting dialysis this morning.  States she is left-handed.  I discussed access placement in the nondominant arm which would be her right arm.  She would prefer right arm access.  Plan right arm AV fistula versus graft on Wednesday in the OR as next available OR time.  Vein mapping scheduled.  Will follow-up tomorrow.  Lonni DOROTHA Gretta, MD [facility] [place] Office: [phone]     [1]  Allergies Allergen Reactions   Amoxicillin Anaphylaxis and Rash   Biaxin  [Clarithromycin ] Itching and Rash   Penicillins Anaphylaxis and Rash    Received ceftriaxone  in 2022 with no issues   Tetracyclines &  Related Anaphylaxis   Vibramycin [Doxycycline] Anaphylaxis and Rash   Flagyl  [Metronidazole ] Itching and Rash   Red Dye #40 (Allura Red) Itching   "

## 2024-01-18 NOTE — Progress Notes (Signed)
 Received patient in bed to unit.  Alert and oriented.  Informed consent signed and in chart.   TX duration:3 hours IR came to put clotting agent in catheter, changed dressing to gauze and transparent.  Patient tolerated well.  Transported back to the room  Alert, without acute distress.  Hand-off given to patient's nurse.   Access used: R Chest HD cath Access issues: none  Total UF removed: 1.5L Medication(s) given: Percocet   01/18/24 1219  Vitals  Temp 98.4 F (36.9 C)  Temp Source Oral  BP (!) 144/90  Pulse Rate (!) 110  Resp (!) 21  Oxygen Therapy  SpO2 100 %  O2 Device Room Air  Patient Activity (if Appropriate) In bed  During Treatment Monitoring  Blood Flow Rate (mL/min) 299 mL/min  Arterial Pressure (mmHg) -11.71 mmHg  Venous Pressure (mmHg) 117.37 mmHg  TMP (mmHg) 12.12 mmHg  Ultrafiltration Rate (mL/min) 700 mL/min  Dialysate Flow Rate (mL/min) 300 ml/min  Dialysate Potassium Concentration 3  Dialysate Calcium  Concentration 2.5  Duration of HD Treatment -hour(s) 3 hour(s)  Cumulative Fluid Removed (mL) per Treatment  1500.16  HD Safety Checks Performed Yes  Intra-Hemodialysis Comments Tolerated well;Tx completed  Post Treatment  Dialyzer Clearance Lightly streaked  Liters Processed 54  Fluid Removed (mL) 1500 mL  Tolerated HD Treatment Yes  Hemodialysis Catheter Right Internal jugular Double lumen Permanent (Tunneled)  Placement Date/Time: 01/15/24 1243   Serial / Lot #: [id number]  Expiration Date: 06/05/28  Time Out: Correct patient;Correct site;Correct procedure  Maximum sterile barrier precautions: Hand hygiene;Cap;Mask;Sterile gown;Sterile gloves;Large sterile s...  Site Condition No complications  Blue Lumen Status Antimicrobial dead end cap;Heparin  locked;Flushed  Red Lumen Status Flushed;Heparin  locked;Antimicrobial dead end cap  Purple Lumen Status N/A  Catheter fill solution Heparin  1000 units/ml  Catheter fill volume (Arterial) 1.6 cc   Catheter fill volume (Venous) 1.6  Dressing Type Gauze/Drain sponge;Transparent  Dressing Status Antimicrobial disc/dressing in place;Clean, Dry, Intact  Drainage Description None  Dressing Change Due 01/25/24  Post treatment catheter status Capped and Clamped      Camellia Brasil LPN [facility]

## 2024-01-18 NOTE — Progress Notes (Deleted)
 Patients name and DOB were verified. The old dressing was removed and a quikclot was applied with a new dressing. Hemostasis was achieved. Quikclot can be removed in 24 hours per Clotilda Hesselbach PA.    Cassadie Pankonin RT(R)

## 2024-01-18 NOTE — Progress Notes (Addendum)
 Went to meet with pt in HD at bedside to discuss out-pt HD options, pt in procedure at this time, will discuss and plan to meet with pt once back in room.   Sara [name] Dialysis Navigator [phone]  Addendum 1:13 pm Met bedside with pt to discuss out-pt HD options. Pt states she does not see a nephrologist regularly and has no preference. When asked about address on file, she states that this is where she gets her mail delivered to and that her shelter address is [address] [place], [place]. Shelter name is [employer]. Pt prefers a MWF 2nd shift if possible, but understands most clinics are full and knows that she can be requested to change when things opens up once started. Pt stated she does not current transportation and will need assistance. States that she can likely get a ride for 1st few appointments and establish this with the assistance of dialysis clinic social workers. Only close clinic is [facility], which is showing chairs in buffer zones. Will submitted referral and likely RFA for admissions and update as feasible.   Addendum 1:35pm Referral submitted to [facility] admissions for review. Pending medical and financial clearance at this time, will update when possible.   Addendum 1:55pm Clinic FA at [facility] states they will begin seeing if they can create a spot for her. Will continue to assist.

## 2024-01-18 NOTE — Progress Notes (Signed)
 [facility] Progress Note   Assessment/ Plan:   # AKI on CKD 3b/IV->now ESRD -Secondary to biopsy-proven severe diabetic kidney disease -Third session of HD today -CLIP: renal navigator following - Consult vascular surgery for access creation    # N/V - Has improved.  Gastroparesis playing a role  #Vision changes -chronic issue likely (diabetic retinopathy) -per primary   # HTN  - BP stable -UF as tolerated with HD   # Normocytic anemia  - Continue ESA and transfuse as needed - On oral iron       Subjective:    Patient having a lot of bleeding from catheter insertion site.  Otherwise no complaints.  Planning for dialysis today   Objective:   BP 123/71   Pulse (!) 108   Temp 98 F (36.7 C)   Resp 14   Ht 5' 8 (1.727 m)   Wt 82.7 kg   SpO2 98%   BMI 27.72 kg/m  No intake or output data in the 24 hours ending 01/18/24 1015   Weight change:   Physical Exam: Gen: laying flat in bed, NAD CVS: normal rate Resp:nonlabored, normal wob Abd: soft Ext: trace lee Neuro: awake, alert, no myoclonic jerks observed Dialysis access: [facility]  Imaging: No results found.    Labs: BMET Recent Labs  Lab 01/12/24 0321 01/13/24 0317 01/14/24 0347 01/15/24 0227 01/16/24 0327 01/18/24 0320  NA 142 141 140 139 138 137  K 4.8 5.2* 5.5* 5.6* 4.6 4.4  CL 111 111 111 110 105 103  CO2 19* 21* 21* 21* 24 26  GLUCOSE 118* 100* 112* 135* 79 77  BUN 36* 34* 31* 33* 22* 19  CREATININE 4.47* 4.41* 4.43* 4.57* 3.66* 3.66*  CALCIUM  8.3* 8.4* 8.5* 8.8* 8.1* 8.5*  PHOS  --  4.5 4.3 4.8* 3.8 3.8   CBC Recent Labs  Lab 01/13/24 0317 01/15/24 0227 01/16/24 0327 01/18/24 0320  WBC 7.0 7.7 6.0 6.4  HGB 7.5* 7.7* 7.7* 7.8*  HCT 23.7* 23.9* 24.3* 24.2*  MCV 84.6 86.9 84.7 84.0  PLT 192 192 201 210    Medications:     acetaminophen   325 mg Oral Once   amLODipine   10 mg Oral Daily   aspirin  EC  81 mg Oral Daily   atorvastatin   40 mg Oral QHS   bisacodyl    10 mg Rectal Once   Chlorhexidine  Gluconate Cloth  6 each Topical Q0600   cyclobenzaprine   10 mg Oral BID   darbepoetin (ARANESP ) injection - DIALYSIS  100 mcg Subcutaneous Q Sun-1800   fluticasone  furoate-vilanterol  1 puff Inhalation Daily   gabapentin   100 mg Oral BID   heparin  injection (subcutaneous)  5,000 Units Subcutaneous Q8H   heparin  sodium (porcine)  3,200 Units Intracatheter Once   hydrALAZINE   100 mg Oral Q8H   insulin  aspart  0-6 Units Subcutaneous TID WC   insulin  glargine  6 Units Subcutaneous Daily   iron  polysaccharides  150 mg Oral Daily   melatonin  3 mg Oral QHS   metoCLOPramide  (REGLAN ) injection  10 mg Intravenous TID AC   metoprolol  succinate  12.5 mg Oral Daily   montelukast   10 mg Oral QHS   oxidized cellulose  1 each Topical Once   pantoprazole   40 mg Oral Daily   polyethylene glycol  17 g Oral BID   senna-docusate  2 tablet Oral BID   sodium bicarbonate   650 mg Oral BID   torsemide   40 mg Oral Daily

## 2024-01-18 NOTE — Procedures (Signed)
 Patients name and DOB were verified. The old dressing was removed and a quikclot was applied with a new dressing. Hemostasis was achieved. Quikclot can be removed in 24 hours per Clotilda Hesselbach PA.    Cassadie Pankonin RT(R)

## 2024-01-18 NOTE — Progress Notes (Signed)
 "                        PROGRESS NOTE        PATIENT DETAILS Name: Sara [name] Age: 48 y.o. Sex: female Date of Birth: 16-Mar-1975 Admit Date: 12/30/2023 Admitting Physician Madison DELENA Peaches, MD ERE:[email or web address], Ronalee, MD  Brief Summary: Patient is a 48 y.o.  female with history of DM-2-who initially presented to the hospital with suicidal evaluation and hyperosmolar nonketotic hyperglycemia.  Hospital course complicated by AKI-s/p renal biopsy 12/15-thought to have diabetic kidney with no reversibility-and now thought to have ESRD-after extensive discussion with  patient started on HD.  See below for further details  Significant events: 12/4>> admit to [facility] 12/15>> renal biopsy 12/19>> started HD  Significant studies: 12/5>> renal ultrasound: No hydronephrosis  Significant microbiology data: None  Procedures: 12/15>> renal biopsy (diabetic kidney disease, aterionephrosclerosis. 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  Does not want to try Reglan  any longer.  No further blurry vision once CBGs corrected.  Objective: Vitals: Blood pressure 123/71, pulse (!) 108, temperature 98 F (36.7 C), resp. rate 14, height 5' 8 (1.727 m), weight 82.7 kg, SpO2 98%.   Exam: Gen Exam:Alert awake-not in any distress HEENT:atraumatic, normocephalic Chest: B/L clear to auscultation anteriorly CVS:S1S2 regular Abdomen:soft non tender, non distended Extremities:no edema Neurology: Non focal Skin: no rash  Pertinent Labs/Radiology:    Latest Ref Rng & Units 01/18/2024    3:20 AM 01/16/2024    3:27 AM 01/15/2024    2:27 AM  CBC  WBC 4.0 - 10.5 K/uL 6.4  6.0  7.7   Hemoglobin 12.0 - 15.0 g/dL 7.8  7.7  7.7   Hematocrit 36.0 - 46.0 % 24.2  24.3   23.9   Platelets 150 - 400 K/uL 210  201  192     Lab Results  Component Value Date   NA 137 01/18/2024   K 4.4 01/18/2024   CL 103 01/18/2024   CO2 26 01/18/2024      Assessment/Plan: Hyperosmolar nonketotic hyperglycemia Resolved with IVF/IV fluids  DM-2 with uncontrolled hyperglycemia (A1c 8.6 on 9/9) CBG is stabilized-no further issues with hypoglycemia Lantus  6 units daily+ SSI Follow/optimize  Recent Labs    01/17/24 1740 01/17/24 2159 01/18/24 0720  GLUCAP 118* 128* 83     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  Nephrology following and directing care. TOC following for arrangement of outpatient HD.  Hyperkalemia Resolved to HD-no longer on Lokelma .  Nausea Suspicion that this may be related to AKI-however continues in spite of HD x 2-now thought that she may have a component of gastroparesis After extensive discussion-IV Reglan  was started on 12/21 (patient was very hesitant)-this morning-her nausea is somewhat better but she is not keen on taking IV Reglan  any longer.  Will  continue to emphasize small portion meals Continue as needed Zofran /Phenergan .  Note-having a BM almost on a daily basis-abdomen nondistended-soft on exam.  Multifactorial anemia Secondary to underlying CKD/critical illness/iron  deficiency Continued iron  supplementation S/p 1 unit of PRBC on 12/6 Follow CBC periodically  HTN BP relatively stable Continue amlodipine /metoprolol  (does not want to  take Coreg )  Follow/optimize.  HLD Statin  GERD PPI  Suicidal ideation Recurrent major depressive disorder Evaluated by psychiatry-initially inpatient psychiatry was recommended-however due to prolonged hospitalization-her symptoms have improved-psychiatry no longer recommending inpatient admission-bedside  sitter has been discontinued Continue trazodone  for sleep Needs outpatient follow-up with psychiatry  Blurry vision On and off for the past-sounds like more of a chronic issue-but was worse around 12 19-12 20-this was probably secondary to hypoglycemia Reassessed this morning-her vision is back to baseline-no further worsening blurry vision-she does have glasses that she uses at baseline Needs outpatient follow-up with ophthalmology.  Homelessness TOC evaluation prior to discharge  Code status:   Code Status: Full Code   DVT Prophylaxis: heparin  injection 5,000 Units Start: 01/11/24 2200   Family Communication: None at bedside   Disposition Plan: Status is: Inpatient Remains inpatient appropriate because: Severity of illness   Planned Discharge Destination:Home   Diet: Diet Order             Diet renal/carb modified with fluid restriction Diet-HS Snack? Nothing; Fluid restriction: 1500 mL Fluid; Room service appropriate? Yes; Fluid consistency: Thin  Diet effective now                     Antimicrobial agents: Anti-infectives (From admission, onward)    None        MEDICATIONS: Scheduled Meds:  acetaminophen   325 mg Oral Once   amLODipine   10 mg Oral Daily   aspirin  EC  81 mg Oral Daily   atorvastatin   40 mg Oral QHS   bisacodyl   10 mg Rectal Once   Chlorhexidine  Gluconate Cloth  6 each Topical Q0600   cyclobenzaprine   10 mg Oral BID   darbepoetin (ARANESP ) injection - DIALYSIS  100 mcg Subcutaneous Q Sun-1800   fluticasone  furoate-vilanterol  1 puff Inhalation Daily   gabapentin   100 mg Oral BID   heparin  injection (subcutaneous)  5,000 Units Subcutaneous Q8H   heparin  sodium (porcine)  3,200 Units Intracatheter Once   hydrALAZINE   100 mg Oral Q8H   insulin  aspart  0-6 Units Subcutaneous TID WC   insulin  glargine  6 Units Subcutaneous Daily   iron  polysaccharides  150 mg Oral Daily   melatonin  3 mg Oral QHS   metoCLOPramide  (REGLAN ) injection   10 mg Intravenous TID AC   metoprolol  succinate  12.5 mg Oral Daily   montelukast   10 mg Oral QHS   oxidized cellulose  1 each Topical Once   pantoprazole   40 mg Oral Daily   polyethylene glycol  17 g Oral BID   senna-docusate  2 tablet Oral BID   sodium bicarbonate   650 mg Oral BID   torsemide   40 mg Oral Daily   Continuous Infusions:  promethazine  (PHENERGAN ) injection (IM or IVPB) 150 mL/hr at 01/14/24 1524   PRN Meds:.acetaminophen  **OR** acetaminophen , albuterol , cyclobenzaprine , dextrose , hydrALAZINE , LORazepam , ondansetron  **OR** ondansetron  (ZOFRAN ) IV, oxyCODONE -acetaminophen , promethazine  (PHENERGAN ) injection (IM or IVPB), sodium chloride , traZODone    I have personally reviewed following labs and imaging studies  LABORATORY DATA: CBC: Recent Labs  Lab 01/12/24 0321 01/13/24 0317 01/15/24 0227 01/16/24 0327 01/18/24 0320  WBC 8.3 7.0 7.7 6.0 6.4  HGB 8.2* 7.5* 7.7* 7.7* 7.8*  HCT 26.6* 23.7* 23.9* 24.3* 24.2*  MCV 85.8 84.6 86.9 84.7 84.0  PLT 128* 192 192 201 210    Basic Metabolic Panel: Recent Labs  Lab 01/13/24 0317 01/14/24 0347 01/15/24 0227 01/16/24 0327 01/18/24 0320  NA 141 140 139 138  137  K 5.2* 5.5* 5.6* 4.6 4.4  CL 111 111 110 105 103  CO2 21* 21* 21* 24 26  GLUCOSE 100* 112* 135* 79 77  BUN 34* 31* 33* 22* 19  CREATININE 4.41* 4.43* 4.57* 3.66* 3.66*  CALCIUM  8.4* 8.5* 8.8* 8.1* 8.5*  PHOS 4.5 4.3 4.8* 3.8 3.8    GFR: Estimated Creatinine Clearance: 21 mL/min (A) (by C-G formula based on SCr of 3.66 mg/dL (H)).  Liver Function Tests: Recent Labs  Lab 01/13/24 0317 01/14/24 0347 01/15/24 0227 01/16/24 0327 01/18/24 0320  ALBUMIN  2.8* 2.8* 2.7* 2.6* 2.7*   No results for input(s): LIPASE, AMYLASE in the last 168 hours. No results for input(s): AMMONIA in the last 168 hours.  Coagulation Profile: No results for input(s): INR, PROTIME in the last 168 hours.   Cardiac Enzymes: No results for input(s): CKTOTAL,  CKMB, CKMBINDEX, TROPONINI in the last 168 hours.  BNP (last 3 results) No results for input(s): PROBNP in the last 8760 hours.  Lipid Profile: No results for input(s): CHOL, HDL, LDLCALC, TRIG, CHOLHDL, LDLDIRECT in the last 72 hours.  Thyroid Function Tests: No results for input(s): TSH, T4TOTAL, FREET4, T3FREE, THYROIDAB in the last 72 hours.  Anemia Panel: No results for input(s): VITAMINB12, FOLATE, FERRITIN, TIBC, IRON , RETICCTPCT in the last 72 hours.  Urine analysis:    Component Value Date/Time   COLORURINE YELLOW 01/01/2024 0943   APPEARANCEUR HAZY (A) 01/01/2024 0943   LABSPEC 1.011 01/01/2024 0943   PHURINE 6.0 01/01/2024 0943   GLUCOSEU >=500 (A) 01/01/2024 0943   HGBUR MODERATE (A) 01/01/2024 0943   BILIRUBINUR NEGATIVE 01/01/2024 0943   KETONESUR NEGATIVE 01/01/2024 0943   PROTEINUR >=300 (A) 01/01/2024 0943   UROBILINOGEN 0.2 12/11/2009 1249   NITRITE NEGATIVE 01/01/2024 0943   LEUKOCYTESUR NEGATIVE 01/01/2024 0943    Sepsis Labs: Lactic Acid, Venous No results found for: LATICACIDVEN  MICROBIOLOGY: No results found for this or any previous visit (from the past 240 hours).  RADIOLOGY STUDIES/RESULTS: No results found.     LOS: 18 days   Donalda Applebaum, MD  [facility]    To contact the attending provider between 7A-7P or the covering provider during after hours 7P-7A, please log into the web site [email or web address] and access using universal [place] password for that web site. If you do not have the password, please call the hospital operator.  01/18/2024, 10:10 AM    "

## 2024-01-18 NOTE — Plan of Care (Signed)
" °  Problem: Fluid Volume: Goal: Ability to maintain a balanced intake and output will improve Outcome: Progressing   Problem: Nutritional: Goal: Maintenance of adequate nutrition will improve Outcome: Progressing   Problem: Nutritional: Goal: Maintenance of adequate nutrition will improve Outcome: Progressing   Problem: Clinical Measurements: Goal: Diagnostic test results will improve Outcome: Progressing   "

## 2024-01-18 NOTE — Progress Notes (Signed)
 IR came to Dialysis to evaluate catheter.  They put quick clot and put guaze and transparent dressing.

## 2024-01-19 ENCOUNTER — Inpatient Hospital Stay (HOSPITAL_COMMUNITY): Payer: MEDICAID

## 2024-01-19 DIAGNOSIS — N186 End stage renal disease: Secondary | ICD-10-CM

## 2024-01-19 DIAGNOSIS — E1100 Type 2 diabetes mellitus with hyperosmolarity without nonketotic hyperglycemic-hyperosmolar coma (NKHHC): Secondary | ICD-10-CM | POA: Diagnosis not present

## 2024-01-19 LAB — RENAL FUNCTION PANEL
Albumin: 2.8 g/dL — ABNORMAL LOW (ref 3.5–5.0)
Anion gap: 7 (ref 5–15)
BUN: 14 mg/dL (ref 6–20)
CO2: 28 mmol/L (ref 22–32)
Calcium: 8.4 mg/dL — ABNORMAL LOW (ref 8.9–10.3)
Chloride: 102 mmol/L (ref 98–111)
Creatinine, Ser: 2.86 mg/dL — ABNORMAL HIGH (ref 0.44–1.00)
GFR, Estimated: 19 mL/min — ABNORMAL LOW
Glucose, Bld: 100 mg/dL — ABNORMAL HIGH (ref 70–99)
Phosphorus: 2.9 mg/dL (ref 2.5–4.6)
Potassium: 4.2 mmol/L (ref 3.5–5.1)
Sodium: 136 mmol/L (ref 135–145)

## 2024-01-19 LAB — CBC
HCT: 24.5 % — ABNORMAL LOW (ref 36.0–46.0)
Hemoglobin: 8 g/dL — ABNORMAL LOW (ref 12.0–15.0)
MCH: 27.1 pg (ref 26.0–34.0)
MCHC: 32.7 g/dL (ref 30.0–36.0)
MCV: 83.1 fL (ref 80.0–100.0)
Platelets: 214 K/uL (ref 150–400)
RBC: 2.95 MIL/uL — ABNORMAL LOW (ref 3.87–5.11)
RDW: 12.5 % (ref 11.5–15.5)
WBC: 7.5 K/uL (ref 4.0–10.5)
nRBC: 0 % (ref 0.0–0.2)

## 2024-01-19 LAB — GLUCOSE, CAPILLARY
Glucose-Capillary: 82 mg/dL (ref 70–99)
Glucose-Capillary: 118 mg/dL — ABNORMAL HIGH (ref 70–99)
Glucose-Capillary: 154 mg/dL — ABNORMAL HIGH (ref 70–99)
Glucose-Capillary: 101 mg/dL — ABNORMAL HIGH (ref 70–99)

## 2024-01-19 LAB — TSH: TSH: 1.63 u[IU]/mL (ref 0.350–4.500)

## 2024-01-19 MED ORDER — FLUCONAZOLE 150 MG PO TABS
150.0000 mg | ORAL_TABLET | Freq: Once | ORAL | Status: AC
  Administered 2024-01-19: 150 mg via ORAL
  Filled 2024-01-19: qty 1

## 2024-01-19 NOTE — Progress Notes (Signed)
 "                       PROGRESS NOTE       Brief Summary: Sara [name] is a 48 y.o.  female with history of DM-2-who initially presented to the hospital 12/4 with suicidal evaluation and hyperosmolar nonketotic hyperglycemia.  Hospital course complicated by AKI-s/p renal biopsy 12/15-thought to have diabetic kidney with no reversibility-and now thought to have ESRD. Patient has been started on HD and tolerating well. Fistula being placed tomorrow and outpatient HD set up is being pursued.   Significant events: 12/15>> renal biopsy- (diabetic kidney disease, aterionephrosclerosis. 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 started HD  Significant microbiology data: None  Consults: Nephrology Psychiatry Interventional radiology  Subjective: Pt reports no complaints today. No concerns.   Objective: Vitals: Blood pressure 127/74, pulse (!) 114, temperature 97.7 F (36.5 C), temperature source Oral, resp. rate 14, height 5' 8 (1.727 m), weight 80.8 kg, SpO2 98%.   Exam: Gen Exam:Alert awake-not in any distress HEENT:atraumatic, normocephalic Chest: CTAB CVS:S1S2 regular Abdomen:soft non tender, non distended Extremities:no edema Neurology: Non focal Skin: no rash  Pertinent Labs/Radiology:    Latest Ref Rng & Units 01/19/2024    1:32 AM 01/18/2024    3:20 AM 01/16/2024    3:27 AM  CBC  WBC 4.0 - 10.5 K/uL 7.5  6.4  6.0   Hemoglobin 12.0 - 15.0 g/dL 8.0  7.8  7.7   Hematocrit 36.0 - 46.0 % 24.5  24.2  24.3   Platelets 150 - 400 K/uL 214  210  201     Lab Results  Component Value Date   NA 136 01/19/2024   K 4.2 01/19/2024   CL 102 01/19/2024   CO2 28 01/19/2024    Assessment/Plan: Hyperosmolar nonketotic hyperglycemia Resolved with IVF/IV fluids  DM-2 with uncontrolled hyperglycemia (A1c 8.6 on 9/9) CBG is stabilized-no further issues with hypoglycemia Lantus  6 units daily+  SSI Follow/optimize  Tachycardia- sinus rhythm overnight. 110s. 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  Nephrology following and directing care. TOC following for arrangement of outpatient HD. Vascular consulted and tentatively planning to place fistula tomorrow  Hyperkalemia Resolved to HD-no longer on Lokelma .  Multifactorial anemia Secondary to underlying CKD/critical illness/iron  deficiency Continued iron  supplementation S/p 1 unit of PRBC on 12/6 Follow CBC periodically  HTN BP relatively stable Continue amlodipine /metoprolol  (does not want to take Coreg )  Follow/optimize.  HLD Statin  GERD PPI  Suicidal ideation Recurrent major depressive disorder Evaluated by psychiatry-initially inpatient psychiatry was recommended-however due to prolonged hospitalization-her symptoms have improved-psychiatry no longer recommending inpatient admission-bedside sitter has been discontinued Continue trazodone  for sleep Needs outpatient follow-up with psychiatry  Blurry vision On and off for the past-sounds like more of a chronic issue-but was worse around 12 19-12 20-this was probably secondary to hypoglycemia Reassessed this morning-her vision is back to baseline-no further worsening blurry vision-she does have glasses that she uses at baseline Needs outpatient follow-up with ophthalmology.  Homelessness TOC evaluation prior to discharge  Code status:   Code Status: Full Code   DVT Prophylaxis: heparin  injection 5,000 Units Start: 01/11/24 2200   Family Communication: None at bedside  Disposition Plan: Status is: Inpatient Remains inpatient appropriate because: Severity of illness   Planned Discharge  Destination:Home  LABORATORY DATA: CBC: Recent Labs  Lab 01/13/24 0317 01/15/24 0227 01/16/24 0327 01/18/24 0320 01/19/24 0132  WBC  7.0 7.7 6.0 6.4 7.5  HGB 7.5* 7.7* 7.7* 7.8* 8.0*  HCT 23.7* 23.9* 24.3* 24.2* 24.5*  MCV 84.6 86.9 84.7 84.0 83.1  PLT 192 192 201 210 214    Basic Metabolic Panel: Recent Labs  Lab 01/14/24 0347 01/15/24 0227 01/16/24 0327 01/18/24 0320 01/19/24 0132  NA 140 139 138 137 136  K 5.5* 5.6* 4.6 4.4 4.2  CL 111 110 105 103 102  CO2 21* 21* 24 26 28   GLUCOSE 112* 135* 79 77 100*  BUN 31* 33* 22* 19 14  CREATININE 4.43* 4.57* 3.66* 3.66* 2.86*  CALCIUM  8.5* 8.8* 8.1* 8.5* 8.4*  PHOS 4.3 4.8* 3.8 3.8 2.9    GFR: Estimated Creatinine Clearance: 26.6 mL/min (A) (by C-G formula based on SCr of 2.86 mg/dL (H)).  RADIOLOGY STUDIES/RESULTS: IR PATIENT EVAL TECH 0-60 MINS Result Date: 01/18/2024 Candie Tawni BRAVO, RT     01/18/2024 10:41 AM Patients name and DOB were verified. The old dressing was removed and a quikclot was applied with a new dressing. Hemostasis was achieved. Quikclot can be removed in 24 hours per Clotilda Hesselbach PA. Christina Carpentieri RT(R)   LOS: 19 days   Marien LITTIE Piety, MD  [facility]    To contact the attending provider between 7A-7P or the covering provider during after hours 7P-7A, please log into the web site [email or web address] and access using universal [place] password for that web site. If you do not have the password, please call the hospital operator.  01/19/2024, 7:09 AM "

## 2024-01-19 NOTE — Plan of Care (Signed)
" °  Problem: Fluid Volume: Goal: Ability to maintain a balanced intake and output will improve Outcome: Progressing   Problem: Nutritional: Goal: Maintenance of adequate nutrition will improve Outcome: Progressing   Problem: Metabolic: Goal: Ability to maintain appropriate glucose levels will improve Outcome: Progressing   Problem: Respiratory: Goal: Will regain and/or maintain adequate ventilation Outcome: Progressing   "

## 2024-01-19 NOTE — Progress Notes (Signed)
 [facility] Progress Note   Assessment/ Plan:   # AKI on CKD 3b/IV->now ESRD -Secondary to biopsy-proven severe diabetic kidney disease - Continue dialysis on TTS schedule (holiday) -CLIP: renal navigator following - Vascular surgery planning for access creation on Wednesday.  Appreciate help  #Vision changes -chronic issue likely (diabetic retinopathy) -per primary   # HTN  - BP stable -UF as tolerated with HD   # Normocytic anemia  - Continue ESA and transfuse as needed - On oral iron   # Vaginal itching: Concerning for yeast infection.  Defer to primary team      Subjective:    Bleeding from catheter site improved.  Some vaginal itching but otherwise no complaints today.  Tolerated dialysis well with no issues.   Objective:   BP 133/78   Pulse (!) 105   Temp 97.9 F (36.6 C) (Oral)   Resp 12   Ht 5' 8 (1.727 m)   Wt 80.8 kg   SpO2 96%   BMI 27.08 kg/m   Intake/Output Summary (Last 24 hours) at 01/19/2024 1010 Last data filed at 01/18/2024 1220 Gross per 24 hour  Intake --  Output 3000 ml  Net -3000 ml     Weight change:   Physical Exam: Gen: laying flat in bed, NAD CVS: normal rate Resp: Bilateral chest rise with no increased work of breathing Abd: soft, nondistended Ext: Warm and well-perfused Neuro: awake, alert, no myoclonic jerks observed [facility]ialysis [facility]  Imaging: IR PATIENT EVAL TECH 0-60 MINS Result Date: 01/18/2024 Candie Tawni BRAVO, RT     01/18/2024 10:41 AM Patients name and DOB were verified. The old dressing was removed and a quikclot was applied with a new dressing. Hemostasis was achieved. Quikclot can be removed in 24 hours per Clotilda Hesselbach PA. Christina Carpentieri RT(R)     Labs: BMET Recent Labs  Lab 01/13/24 [phone] 01/14/24 0347 01/15/24 0227 01/16/24 0327 01/18/24 0320 01/19/24 0132  NA 141 140 139 138 137 136  K 5.2* 5.5* 5.6* 4.6 4.4 4.2  CL 111 111 110 105 103 102  CO2 21* 21*  21* 24 26 28   GLUCOSE 100* 112* 135* 79 77 100*  BUN 34* 31* 33* 22* 19 14  CREATININE 4.41* 4.43* 4.57* 3.66* 3.66* 2.86*  CALCIUM  8.4* 8.5* 8.8* 8.1* 8.5* 8.4*  PHOS 4.5 4.3 4.8* 3.8 3.8 2.9   CBC Recent Labs  Lab 01/15/24 0227 01/16/24 0327 01/18/24 0320 01/19/24 0132  WBC 7.7 6.0 6.4 7.5  HGB 7.7* 7.7* 7.8* 8.0*  HCT 23.9* 24.3* 24.2* 24.5*  MCV 86.9 84.7 84.0 83.1  PLT 192 201 210 214    Medications:     acetaminophen   325 mg Oral Once   amLODipine   10 mg Oral Daily   aspirin  EC  81 mg Oral Daily   atorvastatin   40 mg Oral QHS   bisacodyl   10 mg Rectal Once   Chlorhexidine  Gluconate Cloth  6 each Topical Q0600   cyclobenzaprine   10 mg Oral BID   darbepoetin (ARANESP ) injection - DIALYSIS  100 mcg Subcutaneous Q Sun-1800   fluticasone  furoate-vilanterol  1 puff Inhalation Daily   gabapentin   100 mg Oral BID   heparin  injection (subcutaneous)  5,000 Units Subcutaneous Q8H   hydrALAZINE   100 mg Oral Q8H   insulin  aspart  0-6 Units Subcutaneous TID WC   insulin  glargine  6 Units Subcutaneous Daily   iron  polysaccharides  150 mg Oral Daily   melatonin  3 mg Oral QHS  metoCLOPramide  (REGLAN ) injection  10 mg Intravenous TID AC   metoprolol  succinate  12.5 mg Oral Daily   montelukast   10 mg Oral QHS   oxidized cellulose  1 each Topical Once   pantoprazole   40 mg Oral Daily   polyethylene glycol  17 g Oral BID   senna-docusate  2 tablet Oral BID   sodium bicarbonate   650 mg Oral BID

## 2024-01-19 NOTE — Plan of Care (Signed)

## 2024-01-19 NOTE — Progress Notes (Signed)
 [facility]  Subjective  -no complaints   Objective 127/74 (!) 114 97.7 F (36.5 C) (Oral) 14 98%  Intake/Output Summary (Last 24 hours) at 01/19/2024 0611 Last data filed at 01/18/2024 1220 Gross per 24 hour  Intake --  Output 3000 ml  Net -3000 ml    Palpable radial brachial pulse bilateral upper extremities  Laboratory Lab Results: Recent Labs    01/18/24 0320 01/19/24 0132  WBC 6.4 7.5  HGB 7.8* 8.0*  HCT 24.2* 24.5*  PLT 210 214   BMET Recent Labs    01/18/24 0320 01/19/24 0132  NA 137 136  K 4.4 4.2  CL 103 102  CO2 26 28  GLUCOSE 77 100*  BUN 19 14  CREATININE 3.66* 2.86*  CALCIUM  8.5* 8.4*    COAG Lab Results  Component Value Date   INR 0.9 01/08/2024   No results found for: PTT  Assessment/Planning:  48 year old female that we were consulted for permanent dialysis access yesterday.  Patient is left-handed.  Plan right arm AV fistula versus graft tomorrow in the OR.  N.p.o. after midnight.  Vein mapping ordered.  Will order consent form.  Restrict right arm from IVs.  Sara [name] 01/19/2024 6:11 AM --

## 2024-01-19 NOTE — TOC Progression Note (Signed)
 Transition of Care [facility]) - Progression Note    Patient Details  Name: Sara [name] MRN: [id number] Date of Birth: [birth year]  Transition of Care [facility]) CM/SW Contact  Landry DELENA Senters, RN Phone Number: 01/19/2024, 8:26 AM  Clinical Narrative:     Plan right arm AV fistula versus graft tomorrow in the OR.   CM will continue to follow.                    Expected Discharge Plan and Services                                               Social Drivers of Health (SDOH) Interventions SDOH Screenings   Food Insecurity: Food Insecurity Present (01/12/2024)  Housing: High Risk (01/12/2024)  Transportation Needs: No Transportation Needs (01/12/2024)  Recent Concern: Transportation Needs - Unmet Transportation Needs (12/31/2023)  Utilities: Not At Risk (12/31/2023)  Alcohol Screen: Low Risk (10/14/2022)  Financial Resource Strain: At Risk (03/16/2023)   Received from [facility]  Physical Activity: Not at Risk (03/16/2023)   Received from [facility]  Social Connections: At Risk (03/16/2023)   Received from [facility]  Stress: Not at Risk (03/16/2023)   Received from [facility]  Tobacco Use: High Risk (01/07/2024)    Readmission Risk Interventions     No data to display

## 2024-01-19 NOTE — Progress Notes (Signed)
.. ° ° °  PROCEDURAL EXPEDITER PROGRESS NOTE  Patient Name: Sara [name]  DOB:1975/08/13 Date of Admission: 12/30/2023  Date of Assessment:01/19/2024   -------------------------------------------------------------------------------------------------------------------   Brief clinical summary: Pt to OR tomorrow for Right Arm Graft vs Fistula  Orders in place:  Yes   Communication with surgical team if no orders: N/A  Labs, test, and orders reviewed: Y  Requires surgical clearance:  No  Barriers noted: N/A   -------------------------------------------------------------------------------------------------------------------  [facility], [place], [place] Please contact us  directly via secure chat (search for [facility]) or by calling us  at [phone] [facility]).

## 2024-01-20 ENCOUNTER — Inpatient Hospital Stay (HOSPITAL_COMMUNITY): Payer: MEDICAID | Admitting: Anesthesiology

## 2024-01-20 ENCOUNTER — Encounter (HOSPITAL_COMMUNITY): Payer: Self-pay | Admitting: Family Medicine

## 2024-01-20 ENCOUNTER — Encounter (HOSPITAL_COMMUNITY): Admission: EM | Disposition: A | Payer: Self-pay | Source: Home / Self Care | Attending: Internal Medicine

## 2024-01-20 DIAGNOSIS — Z992 Dependence on renal dialysis: Secondary | ICD-10-CM

## 2024-01-20 DIAGNOSIS — I132 Hypertensive heart and chronic kidney disease with heart failure and with stage 5 chronic kidney disease, or end stage renal disease: Secondary | ICD-10-CM

## 2024-01-20 DIAGNOSIS — N186 End stage renal disease: Secondary | ICD-10-CM

## 2024-01-20 DIAGNOSIS — E1122 Type 2 diabetes mellitus with diabetic chronic kidney disease: Secondary | ICD-10-CM

## 2024-01-20 DIAGNOSIS — I5030 Unspecified diastolic (congestive) heart failure: Secondary | ICD-10-CM

## 2024-01-20 DIAGNOSIS — D631 Anemia in chronic kidney disease: Secondary | ICD-10-CM

## 2024-01-20 DIAGNOSIS — E1100 Type 2 diabetes mellitus with hyperosmolarity without nonketotic hyperglycemic-hyperosmolar coma (NKHHC): Secondary | ICD-10-CM | POA: Diagnosis not present

## 2024-01-20 DIAGNOSIS — F17200 Nicotine dependence, unspecified, uncomplicated: Secondary | ICD-10-CM

## 2024-01-20 HISTORY — PX: BASCILIC VEIN TRANSPOSITION: SHX5742

## 2024-01-20 LAB — RENAL FUNCTION PANEL
Albumin: 2.7 g/dL — ABNORMAL LOW (ref 3.5–5.0)
Albumin: 2.9 g/dL — ABNORMAL LOW (ref 3.5–5.0)
Anion gap: 9 (ref 5–15)
Anion gap: 10 (ref 5–15)
BUN: 16 mg/dL (ref 6–20)
BUN: 19 mg/dL (ref 6–20)
CO2: 24 mmol/L (ref 22–32)
CO2: 23 mmol/L (ref 22–32)
Calcium: 8.5 mg/dL — ABNORMAL LOW (ref 8.9–10.3)
Calcium: 8.4 mg/dL — ABNORMAL LOW (ref 8.9–10.3)
Chloride: 104 mmol/L (ref 98–111)
Chloride: 102 mmol/L (ref 98–111)
Creatinine, Ser: 3.45 mg/dL — ABNORMAL HIGH (ref 0.44–1.00)
Creatinine, Ser: 3.86 mg/dL — ABNORMAL HIGH (ref 0.44–1.00)
GFR, Estimated: 16 mL/min — ABNORMAL LOW
GFR, Estimated: 14 mL/min — ABNORMAL LOW
Glucose, Bld: 99 mg/dL (ref 70–99)
Glucose, Bld: 267 mg/dL — ABNORMAL HIGH (ref 70–99)
Phosphorus: 3.7 mg/dL (ref 2.5–4.6)
Phosphorus: 3.8 mg/dL (ref 2.5–4.6)
Potassium: 4.6 mmol/L (ref 3.5–5.1)
Potassium: 4.9 mmol/L (ref 3.5–5.1)
Sodium: 136 mmol/L (ref 135–145)
Sodium: 135 mmol/L (ref 135–145)

## 2024-01-20 LAB — GLUCOSE, CAPILLARY
Glucose-Capillary: 87 mg/dL (ref 70–99)
Glucose-Capillary: 78 mg/dL (ref 70–99)
Glucose-Capillary: 85 mg/dL (ref 70–99)
Glucose-Capillary: 79 mg/dL (ref 70–99)
Glucose-Capillary: 81 mg/dL (ref 70–99)
Glucose-Capillary: 146 mg/dL — ABNORMAL HIGH (ref 70–99)
Glucose-Capillary: 208 mg/dL — ABNORMAL HIGH (ref 70–99)

## 2024-01-20 SURGERY — TRANSPOSITION, VEIN, BASILIC
Anesthesia: General | Site: Arm Upper | Laterality: Right

## 2024-01-20 MED ORDER — PAPAVERINE HCL 30 MG/ML IJ SOLN
INTRAMUSCULAR | Status: AC
  Filled 2024-01-20: qty 2

## 2024-01-20 MED ORDER — CEFAZOLIN SODIUM-DEXTROSE 2-3 GM-%(50ML) IV SOLR
INTRAVENOUS | Status: DC | PRN
  Administered 2024-01-20: 2 g via INTRAVENOUS

## 2024-01-20 MED ORDER — ACETAMINOPHEN 325 MG PO TABS
ORAL_TABLET | ORAL | Status: AC
  Filled 2024-01-20: qty 2

## 2024-01-20 MED ORDER — CHLORHEXIDINE GLUCONATE 0.12 % MT SOLN: 15.0000 mL | Freq: Once | OROMUCOSAL | Status: AC

## 2024-01-20 MED ORDER — ACETAMINOPHEN 10 MG/ML IV SOLN: 1000.0000 mg | Freq: Once | INTRAVENOUS | Status: DC | PRN

## 2024-01-20 MED ORDER — HEPARIN 6000 UNIT IRRIGATION SOLUTION
Status: AC
  Filled 2024-01-20: qty 500

## 2024-01-20 MED ORDER — ONDANSETRON HCL 4 MG/2ML IJ SOLN
INTRAMUSCULAR | Status: AC
  Filled 2024-01-20: qty 2

## 2024-01-20 MED ORDER — ORAL CARE MOUTH RINSE
15.0000 mL | Freq: Once | OROMUCOSAL | Status: AC
  Administered 2024-01-20: 15 mL via OROMUCOSAL

## 2024-01-20 MED ORDER — SUCCINYLCHOLINE CHLORIDE 200 MG/10ML IV SOSY
PREFILLED_SYRINGE | INTRAVENOUS | Status: DC | PRN
  Administered 2024-01-20: 80 mg via INTRAVENOUS

## 2024-01-20 MED ORDER — MIDAZOLAM HCL (PF) 2 MG/2ML IJ SOLN
INTRAMUSCULAR | Status: DC | PRN
  Administered 2024-01-20: 2 mg via INTRAVENOUS

## 2024-01-20 MED ORDER — DEXAMETHASONE SOD PHOSPHATE PF 10 MG/ML IJ SOLN
INTRAMUSCULAR | Status: DC | PRN
  Administered 2024-01-20: 5 mg via INTRAVENOUS

## 2024-01-20 MED ORDER — HEPARIN SODIUM (PORCINE) 1000 UNIT/ML IJ SOLN
INTRAMUSCULAR | Status: AC
  Filled 2024-01-20: qty 10

## 2024-01-20 MED ORDER — HEPARIN SODIUM (PORCINE) 1000 UNIT/ML IJ SOLN
INTRAMUSCULAR | Status: DC | PRN
  Administered 2024-01-20: 5000 [IU] via INTRAVENOUS

## 2024-01-20 MED ORDER — MIDAZOLAM HCL 2 MG/2ML IJ SOLN
INTRAMUSCULAR | Status: AC
  Filled 2024-01-20: qty 2

## 2024-01-20 MED ORDER — AMISULPRIDE (ANTIEMETIC) 5 MG/2ML IV SOLN: 10.0000 mg | Freq: Once | INTRAVENOUS | Status: DC | PRN

## 2024-01-20 MED ORDER — PHENYLEPHRINE HCL-NACL 20-0.9 MG/250ML-% IV SOLN
INTRAVENOUS | Status: DC | PRN
  Administered 2024-01-20: 50 ug/min via INTRAVENOUS

## 2024-01-20 MED ORDER — SODIUM CHLORIDE 0.9 % IV SOLN: INTRAVENOUS | Status: DC | PRN

## 2024-01-20 MED ORDER — FENTANYL CITRATE (PF) 100 MCG/2ML IJ SOLN
INTRAMUSCULAR | Status: AC
  Filled 2024-01-20: qty 2

## 2024-01-20 MED ORDER — HEPARIN SODIUM (PORCINE) 1000 UNIT/ML IJ SOLN
INTRAMUSCULAR | Status: AC
  Filled 2024-01-20: qty 4

## 2024-01-20 MED ORDER — LIDOCAINE HCL (PF) 1 % IJ SOLN
INTRAMUSCULAR | Status: AC
  Filled 2024-01-20: qty 30

## 2024-01-20 MED ORDER — 0.9 % SODIUM CHLORIDE (POUR BTL) OPTIME
TOPICAL | Status: DC | PRN
  Administered 2024-01-20: 1000 mL

## 2024-01-20 MED ORDER — STERILE WATER FOR IRRIGATION IR SOLN
Status: DC | PRN
  Administered 2024-01-20: 1000 mL

## 2024-01-20 MED ORDER — PROPOFOL 10 MG/ML IV BOLUS
INTRAVENOUS | Status: DC | PRN
  Administered 2024-01-20: 100 mg via INTRAVENOUS

## 2024-01-20 MED ORDER — LIDOCAINE 2% (20 MG/ML) 5 ML SYRINGE
INTRAMUSCULAR | Status: DC | PRN
  Administered 2024-01-20: 100 mg via INTRAVENOUS

## 2024-01-20 MED ORDER — FENTANYL CITRATE (PF) 100 MCG/2ML IJ SOLN: 25.0000 ug | INTRAMUSCULAR | Status: DC | PRN

## 2024-01-20 MED ORDER — CEFAZOLIN SODIUM 1 G IJ SOLR
INTRAMUSCULAR | Status: AC
  Filled 2024-01-20: qty 20

## 2024-01-20 MED ORDER — HEPARIN SODIUM (PORCINE) 5000 UNIT/ML IJ SOLN
5000.0000 [IU] | Freq: Three times a day (TID) | INTRAMUSCULAR | Status: DC
  Administered 2024-01-21 (×2): 5000 [IU] via SUBCUTANEOUS
  Filled 2024-01-20 (×3): qty 1

## 2024-01-20 MED ORDER — FENTANYL CITRATE (PF) 250 MCG/5ML IJ SOLN
INTRAMUSCULAR | Status: DC | PRN
  Administered 2024-01-20: 100 ug via INTRAVENOUS

## 2024-01-20 MED ORDER — HEPARIN 6000 UNIT IRRIGATION SOLUTION
Status: DC | PRN
  Administered 2024-01-20: 1

## 2024-01-20 SURGICAL SUPPLY — 2 items
DERMABOND ADVANCED .7 DNX6 (GAUZE/BANDAGES/DRESSINGS) IMPLANT
DRSG TEGADERM 2-3/8X2-3/4 SM (GAUZE/BANDAGES/DRESSINGS) IMPLANT

## 2024-01-20 NOTE — Op Note (Signed)
 "   OPERATIVE NOTE   DATE: January 20, 2024  PROCEDURE: right first stage basilic vein transposition (brachiobasilic arteriovenous fistula) placement  PRE-OPERATIVE DIAGNOSIS: ESRD  POST-OPERATIVE DIAGNOSIS: same  SURGEON: Lonni DOROTHA Gaskins, MD  ASSISTANT(S): Ahmed Holster, PA  ANESTHESIA: general  ESTIMATED BLOOD LOSS: Minmal  FINDING(S): 1.  Basilic vein: 3.5 mm, acceptable 2.  Brachial artery: 4.0 mm, atherosclerotic disease evident 3.  Venous outflow: palpable thrill  4.  Radial flow: palpable radial pulse  SPECIMEN(S):  none  INDICATIONS:   Sara [name] is a 48 y.o. female who presents with ESRD and the need for permanent hemodialysis access.  The patient is scheduled for right arm AVF versus AVG.  The patient is aware the risks include but are not limited to: bleeding, infection, steal syndrome, nerve damage, ischemic monomelic neuropathy, failure to mature, and need for additional procedures.  The patient is aware of the risks of the procedure and elects to proceed forward.  An assistant was needed given the complexity the case and also for the anastomosis.   DESCRIPTION: After full informed written consent was obtained from the patient, the patient was brought back to the operating room and placed supine upon the operating table.  Prior to induction, the patient received IV antibiotics.   After obtaining adequate anesthesia, the patient was then prepped and draped in the standard fashion for a right arm access procedure.  I used ultrasound and evaluated the cephalic vein and this was small.  The basilic vein actually looked usable.  I elected for basilic based fistula.  Using SonoSite guidance, the location of these vessels were marked out on the skin.  I made a transverse incision at the level of the antecubitum and dissected through the subcutaneous tissue and fascia to gain exposure of the brachial artery.  This was noted to be 4 mm in diameter externally.  This  was dissected out proximally and distally and controlled with vessel loops .  I then dissected out the basilic vein.  This was noted to be 3.5 mm in diameter externally.  The distal segment of the vein was ligated with a  2-0 silk, and the vein was transected.  The proximal segment was interrogated with serial dilators.  The vein accepted up to a 4.5 mm dilator without any difficulty.  I then instilled the heparinized saline into the vein and clamped it.  At this point, I reset my exposure of the brachial artery.  The patient was given 5,000 units IV heparin .  I then placed the artery under tension proximally and distally.  I made an arteriotomy with a #11 blade, and then I extended the arteriotomy with a Potts scissor.  I injected heparinized saline proximal and distal to this arteriotomy.  The vein was then sewn to the artery in an end-to-side configuration with a running stitch of 6-0 Prolene with the help of my assistant.  Prior to completing this anastomosis, I allowed the vein and artery to backbleed.  There was no evidence of clot from any vessels.  I completed the anastomosis in the usual fashion and then released all vessel loops and clamps.    There was a palpable thrill in the venous outflow, and there was a palpable radial pulse.  At this point, I irrigated out the surgical wound.  There was no further active bleeding.  The subcutaneous tissue was reapproximated with a running stitch of 3-0 Vicryl.  The skin was then reapproximated with a running subcuticular stitch of  4-0 Monocryl.  The skin was then cleaned, dried, and reinforced with Dermabond.  The patient tolerated this procedure well.   COMPLICATIONS: None  CONDITION: Stable  Lonni DOROTHA Gaskins MD [facility] Office: [phone]  Lonni JINNY Gaskins   01/20/2024, 2:15 PM     "

## 2024-01-20 NOTE — Anesthesia Procedure Notes (Signed)
 Procedure Name: Intubation Date/Time: 01/20/2024 12:59 PM  Performed by: Marquelle Musgrave A, CRNAPre-anesthesia Checklist: Patient identified, Emergency Drugs available, Suction available and Patient being monitored Patient Re-evaluated:Patient Re-evaluated prior to induction Oxygen Delivery Method: Circle System Utilized Preoxygenation: Pre-oxygenation with 100% oxygen Induction Type: IV induction Ventilation: Mask ventilation without difficulty Laryngoscope Size: Mac and 3 Grade View: Grade II Tube type: Oral Tube size: 7.0 mm Number of attempts: 1 Airway Equipment and Method: Stylet and Oral airway Placement Confirmation: ETT inserted through vocal cords under direct vision, positive ETCO2 and breath sounds checked- equal and bilateral Secured at: 22 cm Tube secured with: Tape Dental Injury: Teeth and Oropharynx as per pre-operative assessment

## 2024-01-20 NOTE — Progress Notes (Signed)
 [facility]   Subjective  - no acute events   Objective 110/81 (!) 110 97.7 F (36.5 C) 20 98% No intake or output data in the 24 hours ending 01/20/24 1138  Right radial brachial pulse palpable [facility]  Laboratory Lab Results: Recent Labs    01/18/24 0320 01/19/24 0132  WBC 6.4 7.5  HGB 7.8* 8.0*  HCT 24.2* 24.5*  PLT 210 214   BMET Recent Labs    01/19/24 0132 01/20/24 0135  NA 136 136  K 4.2 4.6  CL 102 104  CO2 28 24  GLUCOSE 100* 99  BUN 14 16  CREATININE 2.86* 3.45*  CALCIUM  8.4* 8.5*    COAG Lab Results  Component Value Date   INR 0.9 01/08/2024   No results found for: PTT  Assessment/Planning:  48 year old female with AKI on CKD now ESRD.  Plan right arm AV fistula versus graft.  Vein mapping shows small surface veins and likely AV graft but will evaluate.  All questions answered.  Risk benefits discussed.  Sara [name] 01/20/2024 11:38 AM --

## 2024-01-20 NOTE — Progress Notes (Addendum)
 Following for out-pt HD set up. Referral has been submitted and is awaiting clearance at this time still. Have requested to expedite at this time, but difficult placement this week due to clinics being closed/ on holiday and varying schedules.   Clinic [facility] is willing to accept pt on a TTS schedule 2nd shift, and be on a waitlist for a MWF after starting, (she is first in line for when this comes open). However, having trouble obtaining official start date due to clinics holiday schedule and lack of availability this weekend. Will continue to assist and update as feasible. Have made appropriate [facility] staff aware that pt will be ready for d/c likely after OP today, will document once clinic or admission gets back to me on a start date.   Sara [name] Dialysis Navigator [phone]   Addendum 1:54pm As an update, admissions is closed at this time, so cannot clear in system. [facility] FA has given confirmation on his end that Pt can start out-pt on Monday, go Wednesday and Saturday and then begin her TTS Schedule. Her unofficial chair time is 1105am, she will need to arrive 10am first day for paperwork. Have updated nephrology at this time, who states that while we wait for clearance (should have on Friday), pt will do HD Friday and likely start on Monday. Will go over this info with pt once back in office on Friday.

## 2024-01-20 NOTE — Anesthesia Preprocedure Evaluation (Addendum)
"                                    Anesthesia Evaluation  Patient identified by MRN, date of birth, ID band Patient awake    Reviewed: Allergy & Precautions, NPO status , Patient's Chart, lab work & pertinent test results  Airway Mallampati: I  TM Distance: >3 FB Neck ROM: Full    Dental no notable dental hx.    Pulmonary Current Smoker and Patient abstained from smoking.   Pulmonary exam normal        Cardiovascular hypertension, Pt. on medications and Pt. on home beta blockers + Peripheral Vascular Disease and +CHF  Normal cardiovascular exam     Neuro/Psych  PSYCHIATRIC DISORDERS Anxiety Depression    CVA    GI/Hepatic ,GERD  Medicated and Controlled,,(+)     substance abuse    Endo/Other  diabetes, Insulin  Dependent    Renal/GU ESRFRenal disease     Musculoskeletal Chronic pain   Abdominal   Peds  Hematology  (+) Blood dyscrasia, Sickle cell trait and anemia , REFUSES BLOOD PRODUCTS  Anesthesia Other Findings ESRD  Reproductive/Obstetrics                              Anesthesia Physical Anesthesia Plan  ASA: 2  Anesthesia Plan: General   Post-op Pain Management:    Induction: Intravenous  PONV Risk Score and Plan: 2 and Ondansetron , Dexamethasone  and Treatment may vary due to age or medical condition  Airway Management Planned: Oral ETT  Additional Equipment:   Intra-op Plan:   Post-operative Plan: Extubation in OR  Informed Consent: I have reviewed the patients History and Physical, chart, labs and discussed the procedure including the risks, benefits and alternatives for the proposed anesthesia with the patient or authorized representative who has indicated his/her understanding and acceptance.     Dental advisory given  Plan Discussed with: CRNA  Anesthesia Plan Comments:         Anesthesia Quick Evaluation  "

## 2024-01-20 NOTE — Transfer of Care (Signed)
 Immediate Anesthesia Transfer of Care Note  Patient: Sara [name]  Procedure(s) Performed: RIGHT FIRST STAGE BRACHIAL BASILIC CREATION (Right: Arm Upper)  Patient Location: PACU  Anesthesia Type:General  Level of Consciousness: awake  Airway & Oxygen Therapy: Patient Spontanous Breathing  Post-op Assessment: Report given to RN and Post -op Vital signs reviewed and stable  Post vital signs: Reviewed and stable  Last Vitals:  Vitals Value Taken Time  BP 140/74 01/20/24 14:19  Temp    Pulse 94 01/20/24 14:21  Resp 24 01/20/24 14:21  SpO2 95 % 01/20/24 14:21  Vitals shown include unfiled device data.  Last Pain:  Vitals:   01/20/24 1115  TempSrc:   PainSc: 6       Patients Stated Pain Goal: 0 (01/19/24 0036)  Complications: No notable events documented.

## 2024-01-20 NOTE — Progress Notes (Signed)
  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  Dialysis today -CLIP: renal navigator following.  Hoping to go to [place] - Vascular surgery planning for access creation today appreciate help  #Vision changes -chronic issue likely (diabetic retinopathy) -per primary   # HTN  - BP stable -UF as tolerated with HD   # Normocytic anemia  - Continue ESA and transfuse as needed - On oral iron       Subjective:    Patient denies any specific complaints overall.   Objective:   BP 110/73 (BP Location: Right Arm)   Pulse 90   Temp 98.1 F (36.7 C) (Oral)   Resp 18   Ht 5' 8 (1.727 m)   Wt 80.3 kg   SpO2 96%   BMI 26.92 kg/m  No intake or output data in the 24 hours ending 01/20/24 0828    Weight change: -2.4 kg  Physical Exam: Gen: laying flat in bed, NAD CVS: normal rate Resp: Bilateral chest rise with no increased work of breathing Abd: soft, nondistended Ext: Warm and well-perfused Neuro: awake, alert, no myoclonic jerks observed Dialysis access: [facility]  Imaging: VAS US  UPPER EXT VEIN MAPPING (PRE-OP  AVF) Result Date: 01/19/2024 UPPER EXTREMITY VEIN MAPPING Patient Name:  Sara [name]  Date of Exam:   01/19/2024 Medical Rec #: [id number]       Accession #:    [id number] Date of Birth: 1975/02/07       Patient Gender: F Patient Age:   48 years Exam Location:  [facility] Procedure:      VAS US  UPPER EXT VEIN MAPPING (PRE-OP  AVF) Referring Phys: TERETHA DAMME --------------------------------------------------------------------------------  Indications: Pre-access. History: ESRD, DM2.  Comparison Study: No prior exam. Performing Technologist: Edilia Elden Appl  Examination Guidelines: A complete evaluation includes B-mode imaging, spectral Doppler, color Doppler, and power Doppler as needed of all accessible  portions of each vessel. Bilateral testing is considered an integral part of a complete examination. Limited examinations for reoccurring indications may be performed as noted. +-----------------+-------------+----------+---------+ Right Cephalic   Diameter (cm)Depth (cm)Findings  +-----------------+-------------+----------+---------+ Shoulder             0.19        0.36             +-----------------+-------------+----------+---------+ Prox upper arm       0.17        0.62             +-----------------+-------------+----------+---------+ Mid upper arm        0.10        0.31             +-----------------+-------------+----------+---------+ Dist upper arm       0.08        0.18             +-----------------+-------------+----------+---------+ Antecubital fossa    0.23        0.31   Thrombus  +-----------------+-------------+----------+---------+ Prox forearm         0.21        0.31    IV line  +-----------------+-------------+----------+---------+ Mid forearm          0.15        0.21             +-----------------+-------------+----------+---------+ Dist forearm         0.09  0.28   branching +-----------------+-------------+----------+---------+ Wrist                0.11                         +-----------------+-------------+----------+---------+ +-----------------+-------------+----------+--------------+ Right Basilic    Diameter (cm)Depth (cm)   Findings    +-----------------+-------------+----------+--------------+ Mid upper arm        0.23                              +-----------------+-------------+----------+--------------+ Dist upper arm       0.17                              +-----------------+-------------+----------+--------------+ Antecubital fossa    0.14                              +-----------------+-------------+----------+--------------+ Prox forearm         0.09                               +-----------------+-------------+----------+--------------+ Mid forearm                             not visualized +-----------------+-------------+----------+--------------+ Distal forearm       0.05                              +-----------------+-------------+----------+--------------+ Elbow                0.12                              +-----------------+-------------+----------+--------------+ Wrist                0.08                              +-----------------+-------------+----------+--------------+ +-----------------+-------------+----------+--------------+ Left Cephalic    Diameter (cm)Depth (cm)   Findings    +-----------------+-------------+----------+--------------+ Shoulder             0.28        0.66                  +-----------------+-------------+----------+--------------+ Prox upper arm       0.16        0.38                  +-----------------+-------------+----------+--------------+ Mid upper arm        0.17        0.37                  +-----------------+-------------+----------+--------------+ Dist upper arm       0.28        0.16      Thrombus    +-----------------+-------------+----------+--------------+ Antecubital fossa    0.29        0.14      Thrombus    +-----------------+-------------+----------+--------------+ Prox forearm         0.09        0.48                  +-----------------+-------------+----------+--------------+  Mid forearm          0.07        0.13                  +-----------------+-------------+----------+--------------+ Dist forearm                            not visualized +-----------------+-------------+----------+--------------+ Wrist                0.09        0.22                  +-----------------+-------------+----------+--------------+ +-----------------+-------------+----------+--------------+ Left Basilic     Diameter (cm)Depth (cm)   Findings     +-----------------+-------------+----------+--------------+ Mid upper arm        0.18                              +-----------------+-------------+----------+--------------+ Dist upper arm       0.18                              +-----------------+-------------+----------+--------------+ Antecubital fossa                       not visualized +-----------------+-------------+----------+--------------+ Prox forearm         0.17                              +-----------------+-------------+----------+--------------+ Mid forearm          0.08                              +-----------------+-------------+----------+--------------+ Distal forearm                          not visualized +-----------------+-------------+----------+--------------+ Elbow                0.12                              +-----------------+-------------+----------+--------------+ Wrist                0.09                              +-----------------+-------------+----------+--------------+ *See table(s) above for measurements and observations.  Diagnosing physician:    Preliminary    IR PATIENT EVAL TECH 0-60 MINS Result Date: 01/18/2024 Candie Tawni BRAVO, RT     01/18/2024 10:41 AM Patients name and DOB were verified. The old dressing was removed and a quikclot was applied with a new dressing. Hemostasis was achieved. Quikclot can be removed in 24 hours per Clotilda Hesselbach PA. Christina Carpentieri RT(R)     Labs: BMET Recent Labs  Lab 01/14/24 [phone] 01/15/24 0227 01/16/24 0327 01/18/24 0320 01/19/24 0132 01/20/24 0135  NA 140 139 138 137 136 136  K 5.5* 5.6* 4.6 4.4 4.2 4.6  CL 111 110 105 103 102 104  CO2 21* 21* 24 26 28 24   GLUCOSE 112* 135* 79 77 100* 99  BUN 31* 33* 22* 19 14 16   CREATININE 4.43* 4.57* 3.66* 3.66* 2.86* 3.45*  CALCIUM   8.5* 8.8* 8.1* 8.5* 8.4* 8.5*  PHOS 4.3 4.8* 3.8 3.8 2.9 3.7   CBC Recent Labs  Lab 01/15/24 0227 01/16/24 0327  01/18/24 0320 01/19/24 0132  WBC 7.7 6.0 6.4 7.5  HGB 7.7* 7.7* 7.8* 8.0*  HCT 23.9* 24.3* 24.2* 24.5*  MCV 86.9 84.7 84.0 83.1  PLT 192 201 210 214    Medications:     acetaminophen   325 mg Oral Once   amLODipine   10 mg Oral Daily   aspirin  EC  81 mg Oral Daily   atorvastatin   40 mg Oral QHS   bisacodyl   10 mg Rectal Once   Chlorhexidine  Gluconate Cloth  6 each Topical Q0600   cyclobenzaprine   10 mg Oral BID   darbepoetin (ARANESP ) injection - DIALYSIS  100 mcg Subcutaneous Q Sun-1800   fluticasone  furoate-vilanterol  1 puff Inhalation Daily   gabapentin   100 mg Oral BID   heparin  injection (subcutaneous)  5,000 Units Subcutaneous Q8H   hydrALAZINE   100 mg Oral Q8H   insulin  aspart  0-6 Units Subcutaneous TID WC   insulin  glargine  6 Units Subcutaneous Daily   iron  polysaccharides  150 mg Oral Daily   melatonin  3 mg Oral QHS   metoCLOPramide  (REGLAN ) injection  10 mg Intravenous TID AC   metoprolol  succinate  12.5 mg Oral Daily   montelukast   10 mg Oral QHS   oxidized cellulose  1 each Topical Once   pantoprazole   40 mg Oral Daily   polyethylene glycol  17 g Oral BID   senna-docusate  2 tablet Oral BID   sodium bicarbonate   650 mg Oral BID

## 2024-01-20 NOTE — Progress Notes (Signed)
 "                       PROGRESS NOTE       Brief Summary: Sara [name] is a 48 y.o.  female with history of DM-2-who initially presented to the hospital 12/4 with suicidal evaluation and hyperosmolar nonketotic hyperglycemia.  Hospital course complicated by AKI-s/p renal biopsy 12/15-thought to have diabetic kidney with no reversibility-and now thought to have ESRD. Patient has been started on HD and tolerating well. Fistula being placed today and outpatient HD set up is being pursued.   Significant events: 12/15>> renal biopsy- (diabetic kidney disease, aterionephrosclerosis. 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 started HD 12/24>> fistula placed  Subjective: Pt reports no complaints today. No concerns.   Objective: Vitals: Blood pressure 126/81, pulse 99, temperature 97.7 F (36.5 C), temperature source Oral, resp. rate 18, height 5' 8 (1.727 m), weight 80.3 kg, SpO2 96%.   Exam: Gen Exam:Alert awake-not in any distress HEENT:atraumatic, normocephalic Chest: CTAB CVS:S1S2 regular Abdomen:soft non tender, non distended Extremities:no edema Neurology: Non focal Skin: no rash  Pertinent Labs/Radiology:    Latest Ref Rng & Units 01/19/2024    1:32 AM 01/18/2024    3:20 AM 01/16/2024    3:27 AM  CBC  WBC 4.0 - 10.5 K/uL 7.5  6.4  6.0   Hemoglobin 12.0 - 15.0 g/dL 8.0  7.8  7.7   Hematocrit 36.0 - 46.0 % 24.5  24.2  24.3   Platelets 150 - 400 K/uL 214  210  201     Lab Results  Component Value Date   NA 136 01/20/2024   K 4.6 01/20/2024   CL 104 01/20/2024   CO2 24 01/20/2024    Assessment/Plan: Hyperosmolar nonketotic hyperglycemia Resolved with IVF/IV fluids  DM-2 with uncontrolled hyperglycemia (A1c 8.6 on 9/9) CBG is stabilized-no further issues with hypoglycemia Lantus  6 units daily+ SSI Follow/optimize  Vaginal Itching, discharge- possible yeast infection.  - diflucan  x1.  If not improving, consider pelvic exam and swab  Tachycardia- sinus rhythm. improving. 90-100s. 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  Nephrology following and directing care. TOC following for arrangement of outpatient HD. Vascular consulted and tentatively planning to place fistula tomorrow  Hyperkalemia Resolved to HD-no longer on Lokelma .  Multifactorial anemia Secondary to underlying CKD/critical illness/iron  deficiency Continued iron  supplementation S/p 1 unit of PRBC on 12/6 Follow CBC periodically  HTN BP relatively stable Continue amlodipine /metoprolol  Follow/optimize.  HLD Statin  GERD PPI  Suicidal ideation Recurrent major depressive disorder Evaluated by psychiatry-initially inpatient psychiatry was recommended-however due to prolonged hospitalization-her symptoms have improved-psychiatry no longer recommending inpatient admission-bedside sitter has been discontinued Continue trazodone  for sleep Needs outpatient follow-up with psychiatry  Blurry vision On and off for the past-sounds like more of a chronic issue-but was worse around 12 19-12 20-this was probably secondary to hypoglycemia Reassessed this morning-her vision is back to baseline-no further worsening blurry vision-she does have glasses that she uses at baseline Needs outpatient follow-up with ophthalmology.  Homelessness TOC evaluation prior to discharge  Code status:   Code Status: Full Code   DVT Prophylaxis: heparin  injection 5,000 Units Start: 01/11/24 2200   Family Communication: None at bedside  Consults: Nephrology Psychiatry Interventional radiology  Disposition Plan: Status is: Inpatient Remains inpatient  appropriate because: dispo with outpatient HD and housing insecurities   Planned Discharge Destination:Home  LABORATORY DATA: CBC: Recent  Labs  Lab 01/15/24 0227 01/16/24 0327 01/18/24 0320 01/19/24 0132  WBC 7.7 6.0 6.4 7.5  HGB 7.7* 7.7* 7.8* 8.0*  HCT 23.9* 24.3* 24.2* 24.5*  MCV 86.9 84.7 84.0 83.1  PLT 192 201 210 214    Basic Metabolic Panel: Recent Labs  Lab 01/15/24 0227 01/16/24 0327 01/18/24 0320 01/19/24 0132 01/20/24 0135  NA 139 138 137 136 136  K 5.6* 4.6 4.4 4.2 4.6  CL 110 105 103 102 104  CO2 21* 24 26 28 24   GLUCOSE 135* 79 77 100* 99  BUN 33* 22* 19 14 16   CREATININE 4.57* 3.66* 3.66* 2.86* 3.45*  CALCIUM  8.8* 8.1* 8.5* 8.4* 8.5*  PHOS 4.8* 3.8 3.8 2.9 3.7    GFR: Estimated Creatinine Clearance: 22 mL/min (A) (by C-G formula based on SCr of 3.45 mg/dL (H)).  RADIOLOGY STUDIES/RESULTS: VAS US  UPPER EXT VEIN MAPPING (PRE-OP  AVF) Result Date: 01/19/2024 UPPER EXTREMITY VEIN MAPPING Patient Name:  [name]  Date of Exam:   01/19/2024 Medical Rec #: [id number]       Accession #:    [id number] Date of Birth: 14-Dec-1975       Patient Gender: F Patient Age:   55 years Exam Location:  [facility] Procedure:      VAS US  UPPER EXT VEIN MAPPING (PRE-OP  AVF) Referring Phys: TERETHA DAMME --------------------------------------------------------------------------------  Indications: Pre-access. History: ESRD, DM2.  Comparison Study: No prior exam. Performing Technologist: Edilia Elden Appl  Examination Guidelines: A complete evaluation includes B-mode imaging, spectral Doppler, color Doppler, and power Doppler as needed of all accessible portions of each vessel. Bilateral testing is considered an integral part of a complete examination. Limited examinations for reoccurring indications may be performed as noted. +-----------------+-------------+----------+---------+ Right Cephalic   Diameter (cm)Depth (cm)Findings  +-----------------+-------------+----------+---------+ Shoulder             0.19        0.36             +-----------------+-------------+----------+---------+ Prox upper  arm       0.17        0.62             +-----------------+-------------+----------+---------+ Mid upper arm        0.10        0.31             +-----------------+-------------+----------+---------+ Dist upper arm       0.08        0.18             +-----------------+-------------+----------+---------+ Antecubital fossa    0.23        0.31   Thrombus  +-----------------+-------------+----------+---------+ Prox forearm         0.21        0.31    IV line  +-----------------+-------------+----------+---------+ Mid forearm          0.15        0.21             +-----------------+-------------+----------+---------+ Dist forearm         0.09        0.28   branching +-----------------+-------------+----------+---------+ Wrist                0.11                         +-----------------+-------------+----------+---------+ +-----------------+-------------+----------+--------------+  Right Basilic    Diameter (cm)Depth (cm)   Findings    +-----------------+-------------+----------+--------------+ Mid upper arm        0.23                              +-----------------+-------------+----------+--------------+ Dist upper arm       0.17                              +-----------------+-------------+----------+--------------+ Antecubital fossa    0.14                              +-----------------+-------------+----------+--------------+ Prox forearm         0.09                              +-----------------+-------------+----------+--------------+ Mid forearm                             not visualized +-----------------+-------------+----------+--------------+ Distal forearm       0.05                              +-----------------+-------------+----------+--------------+ Elbow                0.12                              +-----------------+-------------+----------+--------------+ Wrist                0.08                               +-----------------+-------------+----------+--------------+ +-----------------+-------------+----------+--------------+ Left Cephalic    Diameter (cm)Depth (cm)   Findings    +-----------------+-------------+----------+--------------+ Shoulder             0.28        0.66                  +-----------------+-------------+----------+--------------+ Prox upper arm       0.16        0.38                  +-----------------+-------------+----------+--------------+ Mid upper arm        0.17        0.37                  +-----------------+-------------+----------+--------------+ Dist upper arm       0.28        0.16      Thrombus    +-----------------+-------------+----------+--------------+ Antecubital fossa    0.29        0.14      Thrombus    +-----------------+-------------+----------+--------------+ Prox forearm         0.09        0.48                  +-----------------+-------------+----------+--------------+ Mid forearm          0.07        0.13                  +-----------------+-------------+----------+--------------+ Dist forearm  not visualized +-----------------+-------------+----------+--------------+ Wrist                0.09        0.22                  +-----------------+-------------+----------+--------------+ +-----------------+-------------+----------+--------------+ Left Basilic     Diameter (cm)Depth (cm)   Findings    +-----------------+-------------+----------+--------------+ Mid upper arm        0.18                              +-----------------+-------------+----------+--------------+ Dist upper arm       0.18                              +-----------------+-------------+----------+--------------+ Antecubital fossa                       not visualized +-----------------+-------------+----------+--------------+ Prox forearm         0.17                               +-----------------+-------------+----------+--------------+ Mid forearm          0.08                              +-----------------+-------------+----------+--------------+ Distal forearm                          not visualized +-----------------+-------------+----------+--------------+ Elbow                0.12                              +-----------------+-------------+----------+--------------+ Wrist                0.09                              +-----------------+-------------+----------+--------------+ *See table(s) above for measurements and observations.  Diagnosing physician:    Preliminary    IR PATIENT EVAL TECH 0-60 MINS Result Date: 01/18/2024 Candie Tawni BRAVO, RT     01/18/2024 10:41 AM Patients name and DOB were verified. The old dressing was removed and a quikclot was applied with a new dressing. Hemostasis was achieved. Quikclot can be removed in 24 hours per Clotilda Hesselbach PA. Christina Carpentieri RT(R)   LOS: 20 days   Marien LITTIE Piety, MD  [facility]    To contact the attending provider between 7A-7P or the covering provider during after hours 7P-7A, please log into the web site [email or web address] and access using universal [place] password for that web site. If you do not have the password, please call the hospital operator.  01/20/2024, 7:05 AM "

## 2024-01-20 NOTE — Progress Notes (Signed)
" °   01/20/24 2140  Vitals  Temp 98.4 F (36.9 C)  Temp Source Oral  BP 124/76  MAP (mmHg) 89  BP Location Right Arm  BP Method Automatic  Patient Position (if appropriate) Lying  Pulse Rate 98  Pulse Rate Source Monitor  ECG Heart Rate 99  Resp 18  Oxygen Therapy  SpO2 97 %  O2 Device Room Air  During Treatment Monitoring  Blood Flow Rate (mL/min) 0 mL/min  Arterial Pressure (mmHg) 20.2 mmHg  Venous Pressure (mmHg) -36.96 mmHg  TMP (mmHg) 9.49 mmHg  Ultrafiltration Rate (mL/min) 453 mL/min  Dialysate Flow Rate (mL/min) 299 ml/min  Dialysate Potassium Concentration 2  Dialysate Calcium  Concentration 2.5  Duration of HD Treatment -hour(s) 3.5 hour(s)  Cumulative Fluid Removed (mL) per Treatment  1000.06  HD Safety Checks Performed Yes  Intra-Hemodialysis Comments Tx completed  Post Treatment  Dialyzer Clearance Lightly streaked  Liters Processed 84  Fluid Removed (mL) 1000 mL  Tolerated HD Treatment Yes  Post-Hemodialysis Comments  (right arm fistula working)  AVG/AVF Arterial Site Held (minutes) 10 minutes  AVG/AVF Venous Site Held (minutes) 10 minutes  Hemodialysis Catheter Right Internal jugular Double lumen Permanent (Tunneled)  Placement Date/Time: 01/15/24 1243   Serial / Lot #: [id number]  Expiration Date: 06/05/28  Time Out: Correct patient;Correct site;Correct procedure  Maximum sterile barrier precautions: Hand hygiene;Cap;Mask;Sterile gown;Sterile gloves;Large sterile s...  Site Condition No complications  Blue Lumen Status Flushed;Heparin  locked  Red Lumen Status Flushed;Heparin  locked  Catheter fill solution Heparin  1000 units/ml  Catheter fill volume (Arterial) 1.6 cc  Catheter fill volume (Venous) 1.6  Dressing Type Transparent  Dressing Status Clean, Dry, Intact;Antimicrobial disc/dressing in place  Drainage Description None  Dressing Change Due 01/25/24  Post treatment catheter status Capped and Clamped    "

## 2024-01-20 NOTE — Anesthesia Postprocedure Evaluation (Signed)
"   Anesthesia Post Note  Patient: Sara [name]  Procedure(s) Performed: RIGHT FIRST STAGE BRACHIAL BASILIC CREATION (Right: Arm Upper)     Patient location during evaluation: PACU Anesthesia Type: General Level of consciousness: awake Pain management: pain level controlled Vital Signs Assessment: post-procedure vital signs reviewed and stable Respiratory status: spontaneous breathing, nonlabored ventilation and respiratory function stable Cardiovascular status: blood pressure returned to baseline and stable Postop Assessment: no apparent nausea or vomiting Anesthetic complications: no   No notable events documented.  Last Vitals:  Vitals:   01/20/24 1455 01/20/24 1609  BP: (!) 147/88 133/83  Pulse: (!) 105   Resp: (!) 24 15  Temp: 36.9 C 36.9 C  SpO2: 96%     Last Pain:  Vitals:   01/20/24 1622  TempSrc:   PainSc: 7                  Eveleigh Crumpler P Armina Galloway      "

## 2024-01-21 ENCOUNTER — Encounter (HOSPITAL_COMMUNITY): Payer: Self-pay | Admitting: Vascular Surgery

## 2024-01-21 DIAGNOSIS — N186 End stage renal disease: Secondary | ICD-10-CM | POA: Diagnosis not present

## 2024-01-21 DIAGNOSIS — E1100 Type 2 diabetes mellitus with hyperosmolarity without nonketotic hyperglycemic-hyperosmolar coma (NKHHC): Secondary | ICD-10-CM | POA: Diagnosis not present

## 2024-01-21 LAB — GLUCOSE, CAPILLARY
Glucose-Capillary: 135 mg/dL — ABNORMAL HIGH (ref 70–99)
Glucose-Capillary: 174 mg/dL — ABNORMAL HIGH (ref 70–99)
Glucose-Capillary: 215 mg/dL — ABNORMAL HIGH (ref 70–99)
Glucose-Capillary: 193 mg/dL — ABNORMAL HIGH (ref 70–99)

## 2024-01-21 MED ORDER — INSULIN GLARGINE 100 UNIT/ML ~~LOC~~ SOLN
5.0000 [IU] | Freq: Every day | SUBCUTANEOUS | Status: DC
  Administered 2024-01-22: 5 [IU] via SUBCUTANEOUS
  Filled 2024-01-21: qty 0.05

## 2024-01-21 NOTE — Progress Notes (Signed)
 [facility] Progress Note   Assessment/ Plan:   # AKI on CKD 3b/IV->now ESRD -Secondary to biopsy-proven severe diabetic kidney disease - Last dialysis on Wednesday plan for dialysis tomorrow for shift. -CLIP: renal navigator following.  Hoping to go to [place] MWF shift.  May be able to go tomorrow if everything is set up - Underwent first stage AVF creation on 12/24 with vascular surgery.  Appreciate help  #Vision changes -chronic issue likely (diabetic retinopathy) -per primary   # HTN  - BP stable -UF as tolerated with HD   # Normocytic anemia  - Continue ESA and transfuse as needed - On oral iron       Subjective:    Continues to feel well with no complaints.   Objective:   BP 125/78   Pulse 97   Temp 98.7 F (37.1 C) (Oral)   Resp 18   Ht 5' 8 (1.727 m)   Wt 80.3 kg   SpO2 94%   BMI 26.92 kg/m   Intake/Output Summary (Last 24 hours) at 01/21/2024 1027 Last data filed at 01/21/2024 0630 Gross per 24 hour  Intake 200.32 ml  Output 1000 ml  Net -799.68 ml      Weight change: 0 kg  Physical Exam: Gen: Up in chair, no distress CVS: normal rate Resp: Bilateral chest rise with no increased work of breathing Abd: soft, nondistended Ext: Warm and well-perfused Neuro: awake, alert, no myoclonic jerks observed Dialysis access: [facility]  Imaging: VAS US  UPPER EXT VEIN MAPPING (PRE-OP  AVF) Result Date: 01/20/2024 UPPER EXTREMITY VEIN MAPPING Patient Name:  Sara [name]  Date of Exam:   01/19/2024 Medical Rec #: [id number]       Accession #:    [id number] Date of Birth: 02-06-1975       Patient Gender: F Patient Age:   48 years Exam Location:  [facility] Procedure:      VAS US  UPPER EXT VEIN MAPPING (PRE-OP  AVF) Referring Phys: TERETHA DAMME --------------------------------------------------------------------------------  Indications: Pre-access. History: ESRD, DM2.  Comparison Study: No prior exam. Performing Technologist: Edilia Elden Appl  Examination Guidelines: A complete evaluation includes B-mode imaging, spectral Doppler, color Doppler, and power Doppler as needed of all accessible portions of each vessel. Bilateral testing is considered an integral part of a complete examination. Limited examinations for reoccurring indications may be performed as noted. +-----------------+-------------+----------+---------+ Right Cephalic   Diameter (cm)Depth (cm)Findings  +-----------------+-------------+----------+---------+ Shoulder             0.19        0.36             +-----------------+-------------+----------+---------+ Prox upper arm       0.17        0.62             +-----------------+-------------+----------+---------+ Mid upper arm        0.10        0.31             +-----------------+-------------+----------+---------+ Dist upper arm       0.08        0.18             +-----------------+-------------+----------+---------+ Antecubital fossa    0.23        0.31   Thrombus  +-----------------+-------------+----------+---------+ Prox forearm         0.21        0.31    IV line  +-----------------+-------------+----------+---------+ Mid forearm  0.15        0.21             +-----------------+-------------+----------+---------+ Dist forearm         0.09        0.28   branching +-----------------+-------------+----------+---------+ Wrist                0.11                         +-----------------+-------------+----------+---------+ +-----------------+-------------+----------+--------------+ Right Basilic    Diameter (cm)Depth (cm)   Findings    +-----------------+-------------+----------+--------------+ Mid upper arm        0.23                              +-----------------+-------------+----------+--------------+ Dist upper arm       0.17                              +-----------------+-------------+----------+--------------+ Antecubital fossa    0.14                               +-----------------+-------------+----------+--------------+ Prox forearm         0.09                              +-----------------+-------------+----------+--------------+ Mid forearm                             not visualized +-----------------+-------------+----------+--------------+ Distal forearm       0.05                              +-----------------+-------------+----------+--------------+ Elbow                0.12                              +-----------------+-------------+----------+--------------+ Wrist                0.08                              +-----------------+-------------+----------+--------------+ +-----------------+-------------+----------+--------------+ Left Cephalic    Diameter (cm)Depth (cm)   Findings    +-----------------+-------------+----------+--------------+ Shoulder             0.28        0.66                  +-----------------+-------------+----------+--------------+ Prox upper arm       0.16        0.38                  +-----------------+-------------+----------+--------------+ Mid upper arm        0.17        0.37                  +-----------------+-------------+----------+--------------+ Dist upper arm       0.28        0.16      Thrombus    +-----------------+-------------+----------+--------------+ Antecubital fossa    0.29        0.14  Thrombus    +-----------------+-------------+----------+--------------+ Prox forearm         0.09        0.48                  +-----------------+-------------+----------+--------------+ Mid forearm          0.07        0.13                  +-----------------+-------------+----------+--------------+ Dist forearm                            not visualized +-----------------+-------------+----------+--------------+ Wrist                0.09        0.22                  +-----------------+-------------+----------+--------------+  +-----------------+-------------+----------+--------------+ Left Basilic     Diameter (cm)Depth (cm)   Findings    +-----------------+-------------+----------+--------------+ Mid upper arm        0.18                              +-----------------+-------------+----------+--------------+ Dist upper arm       0.18                              +-----------------+-------------+----------+--------------+ Antecubital fossa                       not visualized +-----------------+-------------+----------+--------------+ Prox forearm         0.17                              +-----------------+-------------+----------+--------------+ Mid forearm          0.08                              +-----------------+-------------+----------+--------------+ Distal forearm                          not visualized +-----------------+-------------+----------+--------------+ Elbow                0.12                              +-----------------+-------------+----------+--------------+ Wrist                0.09                              +-----------------+-------------+----------+--------------+ *See table(s) above for measurements and observations.  Diagnosing physician: Debby Robertson Electronically signed by Debby Robertson on 01/20/2024 at 11:27:25 AM.    Final       Labs: BMET Recent Labs  Lab 01/15/24 0227 01/16/24 0327 01/18/24 0320 01/19/24 0132 01/20/24 0135 01/20/24 1759  NA 139 138 137 136 136 135  K 5.6* 4.6 4.4 4.2 4.6 4.9  CL 110 105 103 102 104 102  CO2 21* 24 26 28 24 23   GLUCOSE 135* 79 77 100* 99 267*  BUN 33* 22* 19 14 16 19   CREATININE 4.57* 3.66* 3.66* 2.86* 3.45* 3.86*  CALCIUM  8.8* 8.1* 8.5* 8.4*  8.5* 8.4*  PHOS 4.8* 3.8 3.8 2.9 3.7 3.8   CBC Recent Labs  Lab 01/15/24 0227 01/16/24 0327 01/18/24 0320 01/19/24 0132  WBC 7.7 6.0 6.4 7.5  HGB 7.7* 7.7* 7.8* 8.0*  HCT 23.9* 24.3* 24.2* 24.5*  MCV 86.9 84.7 84.0 83.1  PLT 192 201 210 214     Medications:     acetaminophen   325 mg Oral Once   amLODipine   10 mg Oral Daily   aspirin  EC  81 mg Oral Daily   atorvastatin   40 mg Oral QHS   bisacodyl   10 mg Rectal Once   Chlorhexidine  Gluconate Cloth  6 each Topical Q0600   cyclobenzaprine   10 mg Oral BID   darbepoetin (ARANESP ) injection - DIALYSIS  100 mcg Subcutaneous Q Sun-1800   fluticasone  furoate-vilanterol  1 puff Inhalation Daily   gabapentin   100 mg Oral BID   heparin  injection (subcutaneous)  5,000 Units Subcutaneous Q8H   hydrALAZINE   100 mg Oral Q8H   insulin  aspart  0-6 Units Subcutaneous TID WC   insulin  glargine  6 Units Subcutaneous Daily   iron  polysaccharides  150 mg Oral Daily   melatonin  3 mg Oral QHS   metoCLOPramide  (REGLAN ) injection  10 mg Intravenous TID AC   metoprolol  succinate  12.5 mg Oral Daily   montelukast   10 mg Oral QHS   oxidized cellulose  1 each Topical Once   pantoprazole   40 mg Oral Daily   polyethylene glycol  17 g Oral BID   senna-docusate  2 tablet Oral BID   sodium bicarbonate   650 mg Oral BID

## 2024-01-21 NOTE — Progress Notes (Signed)
 "                       PROGRESS NOTE       Brief Summary: Sara [name] is a 48 y.o.  female with history of DM-2-who initially presented to the hospital 12/4 with suicidal evaluation and hyperosmolar nonketotic hyperglycemia.  Hospital course complicated by AKI-s/p renal biopsy 12/15-thought to have diabetic kidney with no reversibility-and progressed to ESRD. Patient has been started on HD and tolerating well. Fistula placed 12/24 and outpatient HD set up is being pursued.   Significant events: 12/15>> renal biopsy- (diabetic kidney disease, aterionephrosclerosis. 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 started HD 12/24>> fistula placed  Subjective: Pt reports no complaints today. No concerns. Arm at fistula location doing well. Underwent HD overnight.   Objective: Vitals: Blood pressure 136/81, pulse 95, temperature 98.6 F (37 C), temperature source Oral, resp. rate 17, height 5' 8 (1.727 m), weight 80.3 kg, SpO2 94%.   Exam: Gen Exam:Alert awake-not in any distress HEENT:atraumatic, normocephalic Chest: CTAB CVS:S1S2 regular Extremities:no edema. R arm incision healing well. No redness, swelling Neurology: Non focal Skin: no rash  Pertinent Labs/Radiology:    Latest Ref Rng & Units 01/19/2024    1:32 AM 01/18/2024    3:20 AM 01/16/2024    3:27 AM  CBC  WBC 4.0 - 10.5 K/uL 7.5  6.4  6.0   Hemoglobin 12.0 - 15.0 g/dL 8.0  7.8  7.7   Hematocrit 36.0 - 46.0 % 24.5  24.2  24.3   Platelets 150 - 400 K/uL 214  210  201     Lab Results  Component Value Date   NA 135 01/20/2024   K 4.9 01/20/2024   CL 102 01/20/2024   CO2 23 01/20/2024    Assessment/Plan: Hyperosmolar nonketotic hyperglycemia- Resolved with IVF/IV fluids DM-2 with uncontrolled hyperglycemia (A1c 8.6 on 9/9) CBG is stabilized-may be transiently elevated today after she was given a dose of dexamethazone yesterday.  Lantus   6 units daily changed to 5u tomorrow - continue SSI - repeat hgb A1c with morning labs  Vaginal Itching, discharge- possible yeast infection.  - diflucan  x1, symptoms have resolved.  Tachycardia- sinus rhythm. improving. 90-100s. 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  Nephrology following and directing care. TOC following for arrangement of outpatient HD. Vascular consulted and tentatively planning to place fistula tomorrow  Hyperkalemia Resolved to HD-no longer on Lokelma .  Multifactorial anemia Secondary to underlying CKD/critical illness/iron  deficiency Continued iron  supplementation S/p 1 unit of PRBC on 12/6 Follow CBC periodically  HTN BP relatively stable Continue amlodipine /metoprolol  Follow/optimize.  HLD Statin  GERD PPI  Suicidal ideation Recurrent major depressive disorder Evaluated by psychiatry-initially inpatient psychiatry was recommended-however due to prolonged hospitalization-her symptoms have improved-psychiatry no longer recommending inpatient admission-bedside sitter has been discontinued Continue trazodone  for sleep Needs outpatient follow-up with psychiatry  Blurry vision On and off for the past-sounds like more of a chronic issue-but was worse around 12 19-12 20-this was probably secondary to hypoglycemia. her vision is back to baseline-no further worsening blurry vision-she does have glasses that she uses at baseline Needs outpatient follow-up with ophthalmology.  Homelessness TOC evaluation prior to discharge  Code status:   Code Status: Full Code   DVT Prophylaxis: heparin  injection 5,000 Units  Start: 01/21/24 0600   Family Communication: None at bedside  Consults: Nephrology Psychiatry Interventional radiology  Disposition Plan: Status is: Inpatient Remains inpatient appropriate because:  dispo with outpatient HD and housing insecurities   Planned Discharge Destination:Home  LABORATORY DATA: CBC: Recent Labs  Lab 01/15/24 0227 01/16/24 0327 01/18/24 0320 01/19/24 0132  WBC 7.7 6.0 6.4 7.5  HGB 7.7* 7.7* 7.8* 8.0*  HCT 23.9* 24.3* 24.2* 24.5*  MCV 86.9 84.7 84.0 83.1  PLT 192 201 210 214    Basic Metabolic Panel: Recent Labs  Lab 01/16/24 0327 01/18/24 0320 01/19/24 0132 01/20/24 0135 01/20/24 1759  NA 138 137 136 136 135  K 4.6 4.4 4.2 4.6 4.9  CL 105 103 102 104 102  CO2 24 26 28 24 23   GLUCOSE 79 77 100* 99 267*  BUN 22* 19 14 16 19   CREATININE 3.66* 3.66* 2.86* 3.45* 3.86*  CALCIUM  8.1* 8.5* 8.4* 8.5* 8.4*  PHOS 3.8 3.8 2.9 3.7 3.8    GFR: Estimated Creatinine Clearance: 19.6 mL/min (A) (by C-G formula based on SCr of 3.86 mg/dL (H)).  RADIOLOGY STUDIES/RESULTS: VAS US  UPPER EXT VEIN MAPPING (PRE-OP  AVF) Result Date: 01/20/2024 UPPER EXTREMITY VEIN MAPPING Patient Name:  Sara [name]  Date of Exam:   01/19/2024 Medical Rec #: [id number]       Accession #:    [id number] Date of Birth: 08-16-1975       Patient Gender: F Patient Age:   18 years Exam Location:  [facility] Procedure:      VAS US  UPPER EXT VEIN MAPPING (PRE-OP  AVF) Referring Phys: TERETHA DAMME --------------------------------------------------------------------------------  Indications: Pre-access. History: ESRD, DM2.  Comparison Study: No prior exam. Performing Technologist: Edilia Elden Appl  Examination Guidelines: A complete evaluation includes B-mode imaging, spectral Doppler, color Doppler, and power Doppler as needed of all accessible portions of each vessel. Bilateral testing is considered an integral part of a complete examination. Limited examinations for reoccurring indications may be performed as noted. +-----------------+-------------+----------+---------+ Right Cephalic   Diameter (cm)Depth (cm)Findings  +-----------------+-------------+----------+---------+  Shoulder             0.19        0.36             +-----------------+-------------+----------+---------+ Prox upper arm       0.17        0.62             +-----------------+-------------+----------+---------+ Mid upper arm        0.10        0.31             +-----------------+-------------+----------+---------+ Dist upper arm       0.08        0.18             +-----------------+-------------+----------+---------+ Antecubital fossa    0.23        0.31   Thrombus  +-----------------+-------------+----------+---------+ Prox forearm         0.21        0.31    IV line  +-----------------+-------------+----------+---------+ Mid forearm          0.15        0.21             +-----------------+-------------+----------+---------+ Dist forearm         0.09        0.28   branching +-----------------+-------------+----------+---------+ Wrist                0.11                         +-----------------+-------------+----------+---------+ +-----------------+-------------+----------+--------------+  Right Basilic    Diameter (cm)Depth (cm)   Findings    +-----------------+-------------+----------+--------------+ Mid upper arm        0.23                              +-----------------+-------------+----------+--------------+ Dist upper arm       0.17                              +-----------------+-------------+----------+--------------+ Antecubital fossa    0.14                              +-----------------+-------------+----------+--------------+ Prox forearm         0.09                              +-----------------+-------------+----------+--------------+ Mid forearm                             not visualized +-----------------+-------------+----------+--------------+ Distal forearm       0.05                              +-----------------+-------------+----------+--------------+ Elbow                0.12                               +-----------------+-------------+----------+--------------+ Wrist                0.08                              +-----------------+-------------+----------+--------------+ +-----------------+-------------+----------+--------------+ Left Cephalic    Diameter (cm)Depth (cm)   Findings    +-----------------+-------------+----------+--------------+ Shoulder             0.28        0.66                  +-----------------+-------------+----------+--------------+ Prox upper arm       0.16        0.38                  +-----------------+-------------+----------+--------------+ Mid upper arm        0.17        0.37                  +-----------------+-------------+----------+--------------+ Dist upper arm       0.28        0.16      Thrombus    +-----------------+-------------+----------+--------------+ Antecubital fossa    0.29        0.14      Thrombus    +-----------------+-------------+----------+--------------+ Prox forearm         0.09        0.48                  +-----------------+-------------+----------+--------------+ Mid forearm          0.07        0.13                  +-----------------+-------------+----------+--------------+ Dist forearm  not visualized +-----------------+-------------+----------+--------------+ Wrist                0.09        0.22                  +-----------------+-------------+----------+--------------+ +-----------------+-------------+----------+--------------+ Left Basilic     Diameter (cm)Depth (cm)   Findings    +-----------------+-------------+----------+--------------+ Mid upper arm        0.18                              +-----------------+-------------+----------+--------------+ Dist upper arm       0.18                              +-----------------+-------------+----------+--------------+ Antecubital fossa                       not visualized  +-----------------+-------------+----------+--------------+ Prox forearm         0.17                              +-----------------+-------------+----------+--------------+ Mid forearm          0.08                              +-----------------+-------------+----------+--------------+ Distal forearm                          not visualized +-----------------+-------------+----------+--------------+ Elbow                0.12                              +-----------------+-------------+----------+--------------+ Wrist                0.09                              +-----------------+-------------+----------+--------------+ *See table(s) above for measurements and observations.  Diagnosing physician: Debby Robertson Electronically signed by Debby Robertson on 01/20/2024 at 11:27:25 AM.    Final     LOS: 21 days   Marien LITTIE Piety, MD  [facility]    To contact the attending provider between 7A-7P or the covering provider during after hours 7P-7A, please log into the web site [email or web address] and access using universal [place] password for that web site. If you do not have the password, please call the hospital operator.  01/21/2024, 7:00 AM "

## 2024-01-21 NOTE — Plan of Care (Signed)

## 2024-01-22 ENCOUNTER — Other Ambulatory Visit (HOSPITAL_COMMUNITY): Payer: Self-pay

## 2024-01-22 DIAGNOSIS — Z91199 Patient's noncompliance with other medical treatment and regimen due to unspecified reason: Secondary | ICD-10-CM | POA: Diagnosis not present

## 2024-01-22 DIAGNOSIS — N186 End stage renal disease: Secondary | ICD-10-CM | POA: Diagnosis not present

## 2024-01-22 DIAGNOSIS — R45851 Suicidal ideations: Secondary | ICD-10-CM

## 2024-01-22 DIAGNOSIS — N179 Acute kidney failure, unspecified: Secondary | ICD-10-CM | POA: Diagnosis not present

## 2024-01-22 DIAGNOSIS — R739 Hyperglycemia, unspecified: Principal | ICD-10-CM

## 2024-01-22 DIAGNOSIS — E1100 Type 2 diabetes mellitus with hyperosmolarity without nonketotic hyperglycemic-hyperosmolar coma (NKHHC): Secondary | ICD-10-CM | POA: Diagnosis not present

## 2024-01-22 LAB — RENAL FUNCTION PANEL
Albumin: 2.8 g/dL — ABNORMAL LOW (ref 3.5–5.0)
Anion gap: 9 (ref 5–15)
BUN: 21 mg/dL — ABNORMAL HIGH (ref 6–20)
CO2: 27 mmol/L (ref 22–32)
Calcium: 8.3 mg/dL — ABNORMAL LOW (ref 8.9–10.3)
Chloride: 101 mmol/L (ref 98–111)
Creatinine, Ser: 3.45 mg/dL — ABNORMAL HIGH (ref 0.44–1.00)
GFR, Estimated: 16 mL/min — ABNORMAL LOW
Glucose, Bld: 162 mg/dL — ABNORMAL HIGH (ref 70–99)
Phosphorus: 3 mg/dL (ref 2.5–4.6)
Potassium: 3.8 mmol/L (ref 3.5–5.1)
Sodium: 137 mmol/L (ref 135–145)

## 2024-01-22 LAB — GLUCOSE, CAPILLARY: Glucose-Capillary: 96 mg/dL (ref 70–99)

## 2024-01-22 LAB — CBC
HCT: 25.1 % — ABNORMAL LOW (ref 36.0–46.0)
Hemoglobin: 8 g/dL — ABNORMAL LOW (ref 12.0–15.0)
MCH: 27 pg (ref 26.0–34.0)
MCHC: 31.9 g/dL (ref 30.0–36.0)
MCV: 84.8 fL (ref 80.0–100.0)
Platelets: 246 K/uL (ref 150–400)
RBC: 2.96 MIL/uL — ABNORMAL LOW (ref 3.87–5.11)
RDW: 13.2 % (ref 11.5–15.5)
WBC: 10.5 K/uL (ref 4.0–10.5)
nRBC: 0.2 % (ref 0.0–0.2)

## 2024-01-22 LAB — HEMOGLOBIN A1C
Hgb A1c MFr Bld: 8.9 % — ABNORMAL HIGH (ref 4.8–5.6)
Mean Plasma Glucose: 208.73 mg/dL

## 2024-01-22 MED ORDER — INSULIN GLARGINE 100 UNIT/ML SOLOSTAR PEN
5.0000 [IU] | PEN_INJECTOR | Freq: Every day | SUBCUTANEOUS | 0 refills | Status: DC
  Filled 2024-01-22: qty 3, 60d supply, fill #0

## 2024-01-22 MED ORDER — AMLODIPINE BESYLATE 10 MG PO TABS
10.0000 mg | ORAL_TABLET | Freq: Every day | ORAL | 0 refills | Status: AC
  Filled 2024-01-22: qty 30, 30d supply, fill #0

## 2024-01-22 MED ORDER — HEPARIN SODIUM (PORCINE) 1000 UNIT/ML IJ SOLN
INTRAMUSCULAR | Status: AC
  Filled 2024-01-22: qty 1

## 2024-01-22 MED ORDER — MONTELUKAST SODIUM 10 MG PO TABS
10.0000 mg | ORAL_TABLET | Freq: Every day | ORAL | 0 refills | Status: AC
  Filled 2024-01-22: qty 30, 30d supply, fill #0

## 2024-01-22 MED ORDER — OXYCODONE-ACETAMINOPHEN 5-325 MG PO TABS
ORAL_TABLET | ORAL | Status: AC
  Filled 2024-01-22: qty 2

## 2024-01-22 MED ORDER — ALBUTEROL SULFATE HFA 108 (90 BASE) MCG/ACT IN AERS
2.0000 | INHALATION_SPRAY | Freq: Four times a day (QID) | RESPIRATORY_TRACT | 0 refills | Status: AC | PRN
  Filled 2024-01-22: qty 6.7, 25d supply, fill #0

## 2024-01-22 MED ORDER — ATORVASTATIN CALCIUM 40 MG PO TABS
40.0000 mg | ORAL_TABLET | Freq: Every day | ORAL | 0 refills | Status: AC
  Filled 2024-01-22: qty 30, 30d supply, fill #0

## 2024-01-22 MED ORDER — ONDANSETRON HCL 4 MG/2ML IJ SOLN
INTRAMUSCULAR | Status: AC
  Filled 2024-01-22: qty 2

## 2024-01-22 MED ORDER — ASPIRIN 81 MG PO TBEC
81.0000 mg | DELAYED_RELEASE_TABLET | Freq: Every day | ORAL | 0 refills | Status: AC
  Filled 2024-01-22: qty 30, 30d supply, fill #0

## 2024-01-22 MED ORDER — METOPROLOL SUCCINATE ER 25 MG PO TB24
12.5000 mg | ORAL_TABLET | Freq: Every day | ORAL | 0 refills | Status: AC
  Filled 2024-01-22: qty 15, 30d supply, fill #0

## 2024-01-22 MED ORDER — HYDRALAZINE HCL 100 MG PO TABS
100.0000 mg | ORAL_TABLET | Freq: Three times a day (TID) | ORAL | 0 refills | Status: DC
  Filled 2024-01-22: qty 90, 30d supply, fill #0

## 2024-01-22 NOTE — Plan of Care (Signed)
   Problem: Coping: Goal: Ability to adjust to condition or change in health will improve Outcome: Progressing   Problem: Fluid Volume: Goal: Ability to maintain a balanced intake and output will improve Outcome: Progressing

## 2024-01-22 NOTE — Discharge Planning (Signed)
 [facility]  Initial Hemodialysis Orders Dialysis center: [place]  Patient's name: Sara [name] DOB: [birth year] ESRD PMH: T2DM, Biopsy proven diabetic kidney disease   Discharge diagnosis: Progressive CKD --> HD started  Diabetic retinopathy  Allergies: Allergies[1]  Date of First Dialysis: 01/15/24 Cause of renal disease: DM   Dialysis Prescription: Dialysis Frequency: TIW EDW: 77 kg   Dialyzer: 180NRe UF profile/Sodium modeling?: -- Dialysis Bath: 2K    2 Ca  Dialysis access: R internal jugular [facility]   01/15/24 IR  R 1st stage BVT   01/20/24  Dr. Gretta      In Center Medications: Heparin  Dose: Tight bolus  VDRA: per protocol  Venofer: per protocol  Mircera: 100 mcg IV q 2 weeks  Next dose due: 12/29  Discharge labs: Hgb: 8.0 K+: 3.8        Ca: 8.3   Phos: 3.0      Alb: 2.8  Please draw routine  monthly labs. Additional labs needed: - Additional notes/follow-up:    Maisie Ronnald Acosta PA-C      [1]  Allergies Allergen Reactions   Amoxicillin Anaphylaxis and Rash   Biaxin  [Clarithromycin ] Itching and Rash   Penicillins Anaphylaxis and Rash    Received ceftriaxone  in 2022 with no issues   Tetracyclines & Related Anaphylaxis   Vibramycin [Doxycycline] Anaphylaxis and Rash   Flagyl  [Metronidazole ] Itching and Rash   Red Dye #40 (Allura Red) Itching

## 2024-01-22 NOTE — Progress Notes (Addendum)
 [facility] Progress Note   Assessment/ Plan:   # AKI on CKD 3b/IV->now ESRD -Secondary to biopsy-proven severe diabetic kidney disease - Last dialysis on Wednesday  Seen on dialysis through a right IJ [facility] 3K bath 1 L net UF goal BP 116/67 Very strong bruit in right BBF placed 12/24  -CLIP: renal navigator following.  Hoping to go to [place] MWF shift.  Just confirmed with renal navigator that she has been approved.  - Underwent first stage rt BBF placement on 12/24 with vascular surgery.  Appreciate help; great bruit today.  #Vision changes -chronic issue likely (diabetic retinopathy) -per primary   # HTN  - BP stable -UF as tolerated with HD   # Normocytic anemia  - Continue ESA and transfuse as needed - On oral iron     Subjective:    Continues to feel well with no complaints; appetite still not great. Intermittent nausea, had to give zofran  4mg  on hd today.   Objective:   BP 132/76   Pulse (!) 101   Temp 98.1 F (36.7 C)   Resp 12   Ht 5' 8 (1.727 m)   Wt 77.3 kg Comment: Taken via bed.  SpO2 99%   BMI 25.91 kg/m  No intake or output data in the 24 hours ending 01/22/24 0941     Weight change: 0.1 kg  Physical Exam: Gen: supine receiving hd, no distress CVS: normal rate Resp: Bilateral chest rise with no increased work of breathing Abd: soft, nondistended Ext: Warm and well-perfused Neuro: awake, alert, no myoclonic jerks observed Dialysis access: rij TDC, rt bbf strong bruit, tender  Imaging: No results found.     Labs: BMET Recent Labs  Lab 01/16/24 0327 01/18/24 0320 01/19/24 0132 01/20/24 0135 01/20/24 1759 01/22/24 0411  NA 138 137 136 136 135 137  K 4.6 4.4 4.2 4.6 4.9 3.8  CL 105 103 102 104 102 101  CO2 24 26 28 24 23 27   GLUCOSE 79 77 100* 99 267* 162*  BUN 22* 19 14 16 19  21*  CREATININE 3.66* 3.66* 2.86* 3.45* 3.86* 3.45*  CALCIUM  8.1* 8.5* 8.4* 8.5* 8.4* 8.3*  PHOS 3.8 3.8 2.9 3.7 3.8 3.0    CBC Recent Labs  Lab 01/16/24 0327 01/18/24 0320 01/19/24 0132  WBC 6.0 6.4 7.5  HGB 7.7* 7.8* 8.0*  HCT 24.3* 24.2* 24.5*  MCV 84.7 84.0 83.1  PLT 201 210 214    Medications:     acetaminophen   325 mg Oral Once   amLODipine   10 mg Oral Daily   aspirin  EC  81 mg Oral Daily   atorvastatin   40 mg Oral QHS   bisacodyl   10 mg Rectal Once   Chlorhexidine  Gluconate Cloth  6 each Topical Q0600   cyclobenzaprine   10 mg Oral BID   darbepoetin (ARANESP ) injection - DIALYSIS  100 mcg Subcutaneous Q Sun-1800   fluticasone  furoate-vilanterol  1 puff Inhalation Daily   gabapentin   100 mg Oral BID   heparin  injection (subcutaneous)  5,000 Units Subcutaneous Q8H   hydrALAZINE   100 mg Oral Q8H   insulin  aspart  0-6 Units Subcutaneous TID WC   insulin  glargine  5 Units Subcutaneous Daily   iron  polysaccharides  150 mg Oral Daily   melatonin  3 mg Oral QHS   metoCLOPramide  (REGLAN ) injection  10 mg Intravenous TID AC   metoprolol  succinate  12.5 mg Oral Daily   montelukast   10 mg Oral QHS   oxidized cellulose  1  each Topical Once   pantoprazole   40 mg Oral Daily   polyethylene glycol  17 g Oral BID   senna-docusate  2 tablet Oral BID   sodium bicarbonate   650 mg Oral BID

## 2024-01-22 NOTE — Progress Notes (Addendum)
" °  Postoperative hemodialysis access     Date of Surgery:  01/20/2024 Surgeon: Gretta  Subjective:  says her right hand is a little more numb than usual.  The numbness comes and goes.   PHYSICAL EXAMINATION:  Vitals:   01/22/24 0043 01/22/24 0450  BP: 123/78 128/72  Pulse: 94 98  Resp:    Temp: 98.4 F (36.9 C) 98.8 F (37.1 C)  SpO2:      Incision is clean and dry Sensation/motor in digits is intact There is  Thrill  The fistula is palpable  The right radial pulse is easily palpable   ASSESSMENT/PLAN:  Sara [name] is a 48 y.o. year old female who is s/p right 1st stage BVT 01/20/2024 by Dr. Gretta.  - fistula is patent-discussed with her that we will see her back in 6 weeks and if maturing nicely, will plan for 2nd stage BVT.   -she has a palpable right radial pulse.  She has some intermittent worsening numbness to the right hand.  We discussed symptoms of steal and she expressed good understanding that she would need to contact us  should this get worse.   -f/u with [facility] in 6 weeks to check maturation of AVF -will sign off-call as needed.   Lucie Apt, PA-C [facility] [phone]  I have seen and evaluated the patient. I agree with the PA note as documented above.  Status post right first stage basilic vein fistula on Wednesday.  Fistula with a great thrill.  Palpable radial pulse.  Will arrange follow-up in the office in 4 to 6 weeks with duplex and will need second stage assuming this matures as we discussed.  Call vascular with questions or concerns.  Lonni DOROTHA Gretta, MD [facility] [place] Office: [phone]   "

## 2024-01-22 NOTE — Progress Notes (Signed)
 "                       PROGRESS NOTE       Brief Summary: Sara [name] is a 48 y.o.  female with history of DM-2-who initially presented to the hospital 12/4 with suicidal evaluation and hyperosmolar nonketotic hyperglycemia.  Hospital course complicated by significant AKI. Underwent renal biopsy 12/15-thought to have diabetic kidney with no reversibility-and progressed to ESRD. She was given a tunneled cath on 12/19 and started on HD which she is tolerating well. On 12/24 vascular surgery placed a fistula on RUE.  He SI has resolved and throughout her admission was seen by psychiatry who subsequently cleared her for outpatient management.  She is now stable for dc and awaiting set up for outpatient HD. This is complicated by housing insecurity.   Subjective: Pt was seen in HD today and reports some mild nausea. Denies pain at fistula or cath site. Tolerating HD otherwise well.   Objective: Blood pressure 128/72, pulse 98, temperature 98.8 F (37.1 C), temperature source Oral, resp. rate 18, height 5' 8 (1.727 m), weight 80.4 kg, SpO2 98%.  Exam: Gen Exam: Alert awake-not in any distress HEENT: atraumatic, normocephalic Chest: CTAB CVS: S1, S2 regular Extremities: no edema. R arm incision healing well. No redness, swelling Neurology: Non focal Skin: no rash  Pertinent Labs/Radiology:    Latest Ref Rng & Units 01/19/2024    1:32 AM 01/18/2024    3:20 AM 01/16/2024    3:27 AM  CBC  WBC 4.0 - 10.5 K/uL 7.5  6.4  6.0   Hemoglobin 12.0 - 15.0 g/dL 8.0  7.8  7.7   Hematocrit 36.0 - 46.0 % 24.5  24.2  24.3   Platelets 150 - 400 K/uL 214  210  201     Lab Results  Component Value Date   NA 137 01/22/2024   K 3.8 01/22/2024   CL 101 01/22/2024   CO2 27 01/22/2024    Assessment/Plan: Hyperosmolar nonketotic hyperglycemia- Resolved with IVF/IV fluids DM-2 with uncontrolled hyperglycemia (A1c 8.9 on 12/26) CBG is stabilized. If she continues to be well controlled, would consider  discontinuing the sliding scale and only having daily insulin  in order to simplify her home regimen.  Lantus  6 units daily changed to 5u - continue SSI  Vaginal Itching, discharge- possible yeast infection.  - s/p diflucan  x1, symptoms have resolved.  Tachycardia- sinus rhythm. improving. 90-100s. Asymptomatic - continue to monitor  AKI on CKD stage IIIb with progression to ESRD. HD MWF Underwent renal biopsy 12/15 which showed: diabetic kidney disease, aterionephrosclerosis. 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 Fistula placed 12/24 Nephrology following and directing care. TOC following for arrangement of outpatient HD.  Hyperkalemia Resolved to HD-no longer on Lokelma .  Multifactorial anemia- S/p 1 unit of PRBC on 12/6 Secondary to underlying CKD/critical illness/iron  deficiency Continued iron  supplementation Follow CBC periodically  HTN- BP relatively stable Continue amlodipine /metoprolol  Follow/optimize.  HLD Statin  GERD PPI  Suicidal ideation Recurrent major depressive disorder Evaluated by psychiatry-initially inpatient psychiatry was recommended-however due to prolonged hospitalization-her symptoms have improved-psychiatry no longer recommending inpatient admission-bedside sitter has been discontinued Continue trazodone  for sleep Needs outpatient follow-up with psychiatry  Blurry vision On and off for the past-sounds like more of a chronic issue-but was worse around 12 19-12 20-this was probably secondary to hypoglycemia. her vision is  back to baseline-no further worsening blurry vision-she does have glasses that she uses at baseline Needs outpatient follow-up with ophthalmology.  Homelessness TOC evaluation prior to discharge  Code status:   Code Status: Full Code   DVT Prophylaxis: heparin  injection 5,000 Units Start: 01/21/24 0600   Family Communication:  None at bedside  Consults: Nephrology Psychiatry Interventional radiology Vascular surgery   Disposition Plan: Status is: Inpatient Remains inpatient appropriate because: dispo with outpatient HD and housing insecurities   Planned Discharge Destination:Home  LABORATORY DATA: CBC: Recent Labs  Lab 01/16/24 0327 01/18/24 0320 01/19/24 0132  WBC 6.0 6.4 7.5  HGB 7.7* 7.8* 8.0*  HCT 24.3* 24.2* 24.5*  MCV 84.7 84.0 83.1  PLT 201 210 214    Basic Metabolic Panel: Recent Labs  Lab 01/18/24 0320 01/19/24 0132 01/20/24 0135 01/20/24 1759 01/22/24 0411  NA 137 136 136 135 137  K 4.4 4.2 4.6 4.9 3.8  CL 103 102 104 102 101  CO2 26 28 24 23 27   GLUCOSE 77 100* 99 267* 162*  BUN 19 14 16 19  21*  CREATININE 3.66* 2.86* 3.45* 3.86* 3.45*  CALCIUM  8.5* 8.4* 8.5* 8.4* 8.3*  PHOS 3.8 2.9 3.7 3.8 3.0    GFR: Estimated Creatinine Clearance: 22 mL/min (A) (by C-G formula based on SCr of 3.45 mg/dL (H)).  RADIOLOGY STUDIES/RESULTS: No results found.   LOS: 22 days   Marien LITTIE Piety, MD  [facility]    To contact the attending provider between 7A-7P or the covering provider during after hours 7P-7A, please log into the web site [email or web address] and access using universal [place] password for that web site. If you do not have the password, please call the hospital operator.  01/22/2024, 7:01 AM "

## 2024-01-22 NOTE — Progress Notes (Addendum)
 Pt cleared to start at [facility] for a MWF, 1105am chair time. Can start Monday 12/29 at clinic. Will need to arrive 10am for 1st appt.   Met bedside in HD to discuss this with pt, she is agreeable and had no questions. Schedule letter provided at bedside including dates, address of clinic, and times.  Care team including Nephrologist, attending, Social work, [facility]ER, etc, informed at this time.   AVS updated. Will continue to assist as needed.   Sara [name] Dialysis navigator [phone]  D/c noted. Contacted out-pt HD clinic, [facility] to inform of d/c and anticipated arrival Monday. PA renal informed to send orders. No further support needed at this time.

## 2024-01-22 NOTE — Discharge Summary (Signed)
 " Physician Discharge Summary  Patient: Sara [name] FMW:[id number] DOB: [birth year]   Code Status: Full Code Admit date: 12/30/2023 Discharge date: 01/22/2024 Disposition: Home, No home health services recommended PCP: Lenon Homer, MD  Recommendations for Outpatient Follow-up:  Follow up with PCP within 1-2 weeks Regarding general hospital follow up and preventative care Recommend RFP, CBC Follow up with nephrology   Discharge Diagnoses:  Principal Problem:   Type 2 diabetes mellitus with hyperosmolar nonketotic hyperglycemia (HCC) Active Problems:   Acute kidney injury superimposed on CKD   Essential hypertension   Dyslipidemia   GERD without esophagitis   Hyperglycemia   Noncompliance   Suicidal ideation   ESRD (end stage renal disease) [facility])  Brief Hospital Course Summary: Sara [name] is a 48 y.o.  female with history of DM-2-who initially presented to the hospital 12/4 with suicidal evaluation and hyperosmolar nonketotic hyperglycemia.  Hospitalization was further complicated by AKI progressing to ESRD and started on HD.   Hyperosmolar nonketotic hyperglycemia- Resolved with IVF/IV fluids DM-2 with uncontrolled hyperglycemia (A1c 8.9 on 12/26) CBG is stabilized. She was discharged on a daily insulin  and no short acting in order to simplify her regimen and encourage better adherence.  Lantus  6 units daily changed to 5u   AKI on CKD stage IIIb with progression to ESRD. 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 She was tolerated HD well.  Fistula placed 12/24 Nephrology following and directing care. She was set up to start outpatient HD 12/29 and discharged in stable condition. She stated she had family members who could transport her to her appointments  and she was going to live in a shelter.    Multifactorial anemia- S/p 1 unit of PRBC on 12/6 Secondary to underlying CKD/critical illness/iron  deficiency Continued iron  supplementation Hgb remained stable for remaining admission.    Suicidal ideation Recurrent major depressive disorder Evaluated by psychiatry-initially inpatient psychiatry was recommended-however due to prolonged hospitalization-her symptoms have improved-psychiatry no longer recommending inpatient admission. bedside sitter has been discontinued Continue trazodone  for sleep Needs outpatient follow-up with psychiatry  All other chronic conditions were treated with home medications.    Discharge Condition: Good, improved Recommended discharge diet: Regular healthy diet  Consultations: Nephrology  Psychiatry   Procedures/Studies: Tunneled cath HD Fistula placement   Allergies as of 01/22/2024       Reactions   Amoxicillin Anaphylaxis, Rash   Biaxin  [clarithromycin ] Itching, Rash   Penicillins Anaphylaxis, Rash   Received ceftriaxone  in 2022 with no issues   Tetracyclines & Related Anaphylaxis   Vibramycin [doxycycline] Anaphylaxis, Rash   Flagyl  [metronidazole ] Itching, Rash   Red Dye #40 (allura Red) Itching        Medication List     STOP taking these medications    Advair Diskus 250-50 MCG/ACT Aepb Generic drug: fluticasone -salmeterol   azithromycin  500 MG tablet Commonly known as: ZITHROMAX    bisacodyl  5 MG EC tablet Generic drug: bisacodyl    Colace 2-IN-1 8.6-50 MG tablet Generic drug: senna-docusate   cyclobenzaprine  10 MG tablet Commonly known as: FLEXERIL    fluticasone  furoate-vilanterol 100-25 MCG/ACT Aepb Commonly known as: BREO ELLIPTA    gabapentin  300 MG capsule Commonly known as: NEURONTIN    insulin  aspart 100 UNIT/ML injection Commonly known as: novoLOG    ipratropium-albuterol  0.5-2.5 (3) MG/3ML Soln Commonly known as: DUONEB   Lantus  SoloStar  100 UNIT/ML Solostar  Pen Generic drug: insulin  glargine Replaced by: insulin  glargine 100 UNIT/ML injection   lisinopril  20 MG tablet Commonly known as: ZESTRIL    omeprazole  40 MG capsule Commonly known as: PRILOSEC   ondansetron  4 MG disintegrating tablet Commonly known as: ZOFRAN -ODT       TAKE these medications    acetaminophen  650 MG CR tablet Commonly known as: TYLENOL  Take 650-1,300 mg by mouth daily as needed for pain.   albuterol  108 (90 Base) MCG/ACT inhaler Commonly known as: VENTOLIN  HFA Inhale 2 puffs into the lungs every 6 (six) hours as needed for wheezing or shortness of breath.   amLODipine  10 MG tablet Commonly known as: NORVASC  Take 1 tablet (10 mg total) by mouth daily.   aspirin  EC 81 MG tablet Take 1 tablet (81 mg total) by mouth daily. Swallow whole. Start taking on: January 23, 2024 What changed: when to take this   atorvastatin  40 MG tablet Commonly known as: LIPITOR Take 1 tablet (40 mg total) by mouth at bedtime.   hydrALAZINE  100 MG tablet Commonly known as: APRESOLINE  Take 1 tablet (100 mg total) by mouth every 8 (eight) hours.   insulin  glargine 100 UNIT/ML injection Commonly known as: LANTUS  Inject 0.05 mLs (5 Units total) into the skin daily. Start taking on: January 23, 2024 Replaces: Lantus  SoloStar 100 UNIT/ML Solostar Pen   metoprolol  succinate 25 MG 24 hr tablet Commonly known as: TOPROL -XL Take 0.5 tablets (12.5 mg total) by mouth daily. Start taking on: January 23, 2024   montelukast  10 MG tablet Commonly known as: SINGULAIR  Take 1 tablet (10 mg total) by mouth at bedtime.        Follow-up Information     [facility]. Go on 01/25/2024.   Why: Please arrive 12/29 at 10am for first day.   You will then go every Monday, wednesday, and friday at 11:05 here for your treatments. Contact information: [address] [place] [address] [phone]         [facility] Follow  up.   Why: Please call this number today or Monday morning to arrange transportation to dialysis appointments. Contact information: [phone]        [facility] transportation Follow up.   Why: If [facility] cannot be arranged for transportation needs, please call [facility] at the number listed. Only use this transportation if [facility] cannot be arranged. Contact information: [phone]        Lenon Homer, MD. Schedule an appointment as soon as possible for a visit in 1 week(s).   Specialty: Family Medicine Contact information: [address] [place] [address] [phone]                 Subjective   Pt reports feeling well. Denies CP, SOB. Has no complaints.   All questions and concerns were addressed at time of discharge.  Objective  Blood pressure (!) 142/81, pulse (!) 102, temperature 98.7 F (37.1 C), temperature source Oral, resp. rate (!) 22, height 5' 8 (1.727 m), weight 76.3 kg, SpO2 100%.   General: Pt is alert, awake, not in acute distress Cardiovascular: RRR, S1/S2 +, no rubs, no gallops Respiratory: CTA bilaterally, no wheezing, no rhonchi Abdominal: Soft, NT, ND, bowel sounds + Extremities: no edema, no cyanosis  The results of significant diagnostics from this hospitalization (including imaging, microbiology, ancillary and laboratory) are listed below for reference.   Imaging studies: VAS US  UPPER EXT VEIN MAPPING (PRE-OP  AVF) Result  Date: 01/20/2024 UPPER EXTREMITY VEIN MAPPING Patient Name:  MAGDELENA KINSELLA  Date of Exam:   01/19/2024 Medical Rec #: [id number]       Accession #:    [id number] Date of Birth: May 05, 1975       Patient Gender: F Patient Age:   16 years Exam Location:  [facility] Procedure:      VAS US  UPPER EXT VEIN MAPPING (PRE-OP  AVF) Referring Phys: TERETHA DAMME --------------------------------------------------------------------------------  Indications: Pre-access. History: ESRD, DM2.  Comparison  Study: No prior exam. Performing Technologist: Edilia Elden Appl  Examination Guidelines: A complete evaluation includes B-mode imaging, spectral Doppler, color Doppler, and power Doppler as needed of all accessible portions of each vessel. Bilateral testing is considered an integral part of a complete examination. Limited examinations for reoccurring indications may be performed as noted. +-----------------+-------------+----------+---------+ Right Cephalic   Diameter (cm)Depth (cm)Findings  +-----------------+-------------+----------+---------+ Shoulder             0.19        0.36             +-----------------+-------------+----------+---------+ Prox upper arm       0.17        0.62             +-----------------+-------------+----------+---------+ Mid upper arm        0.10        0.31             +-----------------+-------------+----------+---------+ Dist upper arm       0.08        0.18             +-----------------+-------------+----------+---------+ Antecubital fossa    0.23        0.31   Thrombus  +-----------------+-------------+----------+---------+ Prox forearm         0.21        0.31    IV line  +-----------------+-------------+----------+---------+ Mid forearm          0.15        0.21             +-----------------+-------------+----------+---------+ Dist forearm         0.09        0.28   branching +-----------------+-------------+----------+---------+ Wrist                0.11                         +-----------------+-------------+----------+---------+ +-----------------+-------------+----------+--------------+ Right Basilic    Diameter (cm)Depth (cm)   Findings    +-----------------+-------------+----------+--------------+ Mid upper arm        0.23                              +-----------------+-------------+----------+--------------+ Dist upper arm       0.17                               +-----------------+-------------+----------+--------------+ Antecubital fossa    0.14                              +-----------------+-------------+----------+--------------+ Prox forearm         0.09                              +-----------------+-------------+----------+--------------+  Mid forearm                             not visualized +-----------------+-------------+----------+--------------+ Distal forearm       0.05                              +-----------------+-------------+----------+--------------+ Elbow                0.12                              +-----------------+-------------+----------+--------------+ Wrist                0.08                              +-----------------+-------------+----------+--------------+ +-----------------+-------------+----------+--------------+ Left Cephalic    Diameter (cm)Depth (cm)   Findings    +-----------------+-------------+----------+--------------+ Shoulder             0.28        0.66                  +-----------------+-------------+----------+--------------+ Prox upper arm       0.16        0.38                  +-----------------+-------------+----------+--------------+ Mid upper arm        0.17        0.37                  +-----------------+-------------+----------+--------------+ Dist upper arm       0.28        0.16      Thrombus    +-----------------+-------------+----------+--------------+ Antecubital fossa    0.29        0.14      Thrombus    +-----------------+-------------+----------+--------------+ Prox forearm         0.09        0.48                  +-----------------+-------------+----------+--------------+ Mid forearm          0.07        0.13                  +-----------------+-------------+----------+--------------+ Dist forearm                            not visualized +-----------------+-------------+----------+--------------+ Wrist                0.09         0.22                  +-----------------+-------------+----------+--------------+ +-----------------+-------------+----------+--------------+ Left Basilic     Diameter (cm)Depth (cm)   Findings    +-----------------+-------------+----------+--------------+ Mid upper arm        0.18                              +-----------------+-------------+----------+--------------+ Dist upper arm       0.18                              +-----------------+-------------+----------+--------------+ Antecubital fossa  not visualized +-----------------+-------------+----------+--------------+ Prox forearm         0.17                              +-----------------+-------------+----------+--------------+ Mid forearm          0.08                              +-----------------+-------------+----------+--------------+ Distal forearm                          not visualized +-----------------+-------------+----------+--------------+ Elbow                0.12                              +-----------------+-------------+----------+--------------+ Wrist                0.09                              +-----------------+-------------+----------+--------------+ *See table(s) above for measurements and observations.  Diagnosing physician: Debby Robertson Electronically signed by Debby Robertson on 01/20/2024 at 11:27:25 AM.    Final    IR PATIENT EVAL TECH 0-60 MINS Result Date: 01/18/2024 Candie Tawni BRAVO, RT     01/18/2024 10:41 AM Patients name and DOB were verified. The old dressing was removed and a quikclot was applied with a new dressing. Hemostasis was achieved. Quikclot can be removed in 24 hours per Clotilda Hesselbach PA. Christina Carpentieri RT(R)  IR TUNNELED CENTRAL VENOUS CATH [facility] IMG Result Date: 01/15/2024 EXAM: ULTRASOUND GUIDED VASCULAR ACCESS. FLUOROSCOPY GUIDED PLACEMENT OF A TUNNELED CATHETER. MODERATE CONSCIOUS SEDATION 01/15/2024  12:55:00 PM SEDATION: 75 micrograms fentanyl , 2 milligrams of versed , 14 minutes. FLUOROSCOPY DOSE AND TYPE: Radiation Exposure Index: Reference Air Kerma (in mGy) = less than 0.1 mGy. TECHNIQUE: Informed consent was obtained after a detailed explanation of the procedure including risks, benefits, and alternatives. All aspects of maximum sterile barrier technique were used including washing hands with conventional soap and water  or with alcohol-based hand rubs (ABHR), skin preparation, cap, mask, sterile gown, sterile gloves, and sterile full body drape. Local anesthesia was achieved with lidocaine . A micropuncture needle was used to access the right internal jugular vein using ultrasound guidance. An ultrasound image demonstrating patency of the vein with needle tip located within it was obtained and stored in PACS. A 0.035 guidewire was used to place a peel-away sheath. A subcutaneous tunnel was created to the infraclavicular region and a tunneled Palindrome 19 hemodialysis catheter was pulled through the subcutaneous tunnel to the venotomy site and advanced through the peel-away sheath under fluoroscopic guidance to the right atrium. The catheter flushed easily and there was a good blood return. The catheter was sutured to the skin. The catheter was locked with heparinized saline. Periprocedural antibiotic coverage using vancomycin . The patient tolerated the procedure well and there were no immediate complications. COMPARISON: None available. CLINICAL HISTORY: AKI on CKD, now ESRD, needs   access for  hemodialysis. FINDINGS: Fluoroscopic image demonstrates the tip of the catheter in the right atrium. IMPRESSION: 1. Successful ultrasound and fluoroscopy guided placement of a tunneled Palindrome 19 hemodialysis catheter. Electronically signed by: Dayne Hassell MD 01/15/2024 03:17 PM EST RP Workstation: [id number]   US   BIOPSY (KIDNEY) Result Date: 01/11/2024 INDICATION: Acute kidney injury in a patient with  chronic kidney disease stage IIIB/4 and nephrotic range proteinuria. EXAM: Ultrasound-guided biopsy MEDICATIONS: None. ANESTHESIA/SEDATION: Moderate (conscious) sedation was employed during this procedure. A total of Versed  2 mg and Fentanyl  100 mcg was administered intravenously by the radiology nurse. Total intra-service moderate Sedation Time: 21 minutes. The patient's level of consciousness and vital signs were monitored continuously by radiology nursing throughout the procedure under my direct supervision. COMPLICATIONS: None immediate. PROCEDURE: Informed written consent was obtained from the patient after a thorough discussion of the procedural risks, benefits and alternatives. All questions were addressed. Maximal Sterile Barrier Technique was utilized including caps, mask, sterile gowns, sterile gloves, sterile drape, hand hygiene and skin antiseptic. A timeout was performed prior to the initiation of the procedure. In a prone position on the table, the flank regions were evaluated with ultrasound the left kidney presents as the optimal biopsy location. The patient's left flank was marked, prepped, and draped in usual sterile fashion. Local anesthesia was achieved with 1% lidocaine . A small incision was made patient's left flank region. An introducer needle was then advanced under ultrasound guidance to the lower pole the left kidney. The left lower pole renal parenchyma was engaged with the needle. The stylet was then removed. A BioPince needle was then used to achieve 2 core samples which were placed in collection and sent to laboratory for further evaluation. A small volume of Gel-Foam was then injected under ultrasound guidance. The introducer needle was then removed. Sterile dressing was applied. IMPRESSION: Satisfactory core needle biopsy of the lower pole the left kidney. Electronically Signed   By: Cordella Banner   On: 01/11/2024 13:06   US  RENAL Result Date: 01/01/2024 CLINICAL DATA:  Acute  kidney injury EXAM: RENAL / URINARY TRACT ULTRASOUND COMPLETE COMPARISON:  CT abdomen and pelvis dated 12/06/2023 FINDINGS: Right Kidney: Length = 11.8 cm AP renal pelvis diameter = <10 mm Diffusely increased cortical echogenicity with preserved corticomedullary differentiation which can be seen with medical renal disease. Extrarenal pelvis. No urinary tract dilation or shadowing calculi. The ureter is not seen. Left Kidney: Length = 12.5 cm AP renal pelvis diameter = <10 mm Diffusely increased cortical echogenicity with preserved corticomedullary differentiation which can be seen with medical renal disease. No urinary tract dilation or shadowing calculi. The ureter is not seen. Bladder: Appears normal for degree of bladder distention. Prevoid bladder volume measures 139 mL Other: None. IMPRESSION: Diffusely increased cortical echogenicity with preserved corticomedullary differentiation which can be seen with medical renal disease. No urinary tract dilation or shadowing calculi. Electronically Signed   By: Limin  Xu M.D.   On: 01/01/2024 13:19    Labs: Basic Metabolic Panel: Recent Labs  Lab 01/18/24 0320 01/19/24 0132 01/20/24 0135 01/20/24 1759 01/22/24 0411  NA 137 136 136 135 137  K 4.4 4.2 4.6 4.9 3.8  CL 103 102 104 102 101  CO2 26 28 24 23 27   GLUCOSE 77 100* 99 267* 162*  BUN 19 14 16 19  21*  CREATININE 3.66* 2.86* 3.45* 3.86* 3.45*  CALCIUM  8.5* 8.4* 8.5* 8.4* 8.3*  PHOS 3.8 2.9 3.7 3.8 3.0   CBC: Recent Labs  Lab 01/16/24 0327 01/18/24 0320 01/19/24 0132 01/22/24 0752  WBC 6.0 6.4 7.5 10.5  HGB 7.7* 7.8* 8.0* 8.0*  HCT 24.3* 24.2* 24.5* 25.1*  MCV 84.7 84.0 83.1 84.8  PLT 201 210 214 246   Microbiology: Results for orders placed or performed during  the hospital encounter of 10/05/23  Resp panel by RT-PCR (RSV, Flu A&B, Covid) Anterior Nasal Swab     Status: None   Collection Time: 10/06/23  5:22 PM   Specimen: Anterior Nasal Swab  Result Value Ref Range Status    SARS Coronavirus 2 by RT PCR NEGATIVE NEGATIVE Final    Comment: (NOTE) SARS-CoV-2 target nucleic acids are NOT DETECTED.  The SARS-CoV-2 RNA is generally detectable in upper respiratory specimens during the acute phase of infection. The lowest concentration of SARS-CoV-2 viral copies this assay can detect is 138 copies/mL. A negative result does not preclude SARS-Cov-2 infection and should not be used as the sole basis for treatment or other patient management decisions. A negative result may occur with  improper specimen collection/handling, submission of specimen other than nasopharyngeal swab, presence of viral mutation(s) within the areas targeted by this assay, and inadequate number of viral copies(<138 copies/mL). A negative result must be combined with clinical observations, patient history, and epidemiological information. The expected result is Negative.  Fact Sheet for Patients:  bloggercourse.com  Fact Sheet for Healthcare Providers:  seriousbroker.it  This test is no t yet approved or cleared by the [place]  FDA and  has been authorized for detection and/or diagnosis of SARS-CoV-2 by FDA under an Emergency Use Authorization (EUA). This EUA will remain  in effect (meaning this test can be used) for the duration of the COVID-19 declaration under Section 564(b)(1) of the Act, 21 U.S.C.section 360bbb-3(b)(1), unless the authorization is terminated  or revoked sooner.       Influenza A by PCR NEGATIVE NEGATIVE Final   Influenza B by PCR NEGATIVE NEGATIVE Final    Comment: (NOTE) The Xpert Xpress SARS-CoV-2/FLU/RSV plus assay is intended as an aid in the diagnosis of influenza from Nasopharyngeal swab specimens and should not be used as a sole basis for treatment. Nasal washings and aspirates are unacceptable for Xpert Xpress SARS-CoV-2/FLU/RSV testing.  Fact Sheet for  Patients: bloggercourse.com  Fact Sheet for Healthcare Providers: seriousbroker.it  This test is not yet approved or cleared by the [place]  FDA and has been authorized for detection and/or diagnosis of SARS-CoV-2 by FDA under an Emergency Use Authorization (EUA). This EUA will remain in effect (meaning this test can be used) for the duration of the COVID-19 declaration under Section 564(b)(1) of the Act, 21 U.S.C. section 360bbb-3(b)(1), unless the authorization is terminated or revoked.     Resp Syncytial Virus by PCR NEGATIVE NEGATIVE Final    Comment: (NOTE) Fact Sheet for Patients: bloggercourse.com  Fact Sheet for Healthcare Providers: seriousbroker.it  This test is not yet approved or cleared by the [place]  FDA and has been authorized for detection and/or diagnosis of SARS-CoV-2 by FDA under an Emergency Use Authorization (EUA). This EUA will remain in effect (meaning this test can be used) for the duration of the COVID-19 declaration under Section 564(b)(1) of the Act, 21 U.S.C. section 360bbb-3(b)(1), unless the authorization is terminated or revoked.  Performed at [facility], [address], [place], [place] [address]     Time coordinating discharge: Over 30 minutes  Marien LITTIE Piety, MD  [facility] 01/22/2024, 2:45 PM  "

## 2024-01-22 NOTE — TOC Transition Note (Signed)
 Transition of Care [facility]) - Discharge Note   Patient Details  Name: Sara [name] MRN: [id number] Date of Birth: May 21, 1975  Transition of Care [facility] CM/SW Contact:  Landry DELENA Senters, RN Phone Number: 01/22/2024, 2:54 PM   Clinical Narrative:    Patient will be discharging to [facility] in [place] today. Cab voucher will be provided for transportation to [employer].   O/P dialysis has been set up and patient's first appt will be next Monday 12/29. Patient is aware of appt. Patient has arranged transportation to dialysis appts next week for all 3 appts through her family. CM did provide patient will information to set up transportation for future dialysis appts after next week. CM did inform patient to call either today or first thing Monday morning to ensure this is arranged in a timely manner. Patient verbalizes understanding of this.   CM did contact [facility] to confirm patient can stay there starting tonight and they did confirm this. Patient is supposed to come straight from hospital to shelter, and patient is aware of this.   No therapy needs identified.  No further needs identified by CM.      Final next level of care: Homeless Shelter Barriers to Discharge: No Barriers Identified   Patient Goals and CMS Choice            Discharge Placement                       Discharge Plan and Services Additional resources added to the After Visit Summary for                                       Social Drivers of Health (SDOH) Interventions SDOH Screenings   Food Insecurity: Food Insecurity Present (01/12/2024)  Housing: High Risk (01/12/2024)  Transportation Needs: No Transportation Needs (01/12/2024)  Recent Concern: Transportation Needs - Unmet Transportation Needs (12/31/2023)  Utilities: Not At Risk (12/31/2023)  Alcohol Screen: Low Risk (10/14/2022)  Financial Resource Strain: At Risk (03/16/2023)   Received from [facility]   Physical Activity: Not at Risk (03/16/2023)   Received from [facility]  Social Connections: At Risk (03/16/2023)   Received from [facility]  Stress: Not at Risk (03/16/2023)   Received from [facility]  Tobacco Use: High Risk (01/20/2024)     Readmission Risk Interventions     No data to display

## 2024-01-23 ENCOUNTER — Telehealth: Payer: Self-pay | Admitting: Nephrology

## 2024-01-23 NOTE — Telephone Encounter (Signed)
 Transition of Care - Initial Contact after Hospitalization  Date of discharge:  01/22/24 Date of contact: 01/23/2024  Method: Phone Spoke to: Patient  Patient contacted to discuss transition of care from recent inpatient hospitalization. Patient was admitted to [facility] from 12/3-12/26/25 with discharge diagnosis of uncontrolled DM2 and ESRD starting dialysis.   The discharge medication list was reviewed.   Patient will return to his/her outpatient HD unit on: Monday 12/29  No other concerns at this time.

## 2024-02-08 ENCOUNTER — Other Ambulatory Visit: Payer: Self-pay

## 2024-02-08 DIAGNOSIS — N186 End stage renal disease: Secondary | ICD-10-CM

## 2024-02-23 NOTE — Telephone Encounter (Signed)
 ERROR

## 2024-02-28 ENCOUNTER — Emergency Department (HOSPITAL_COMMUNITY)
Admission: EM | Admit: 2024-02-28 | Discharge: 2024-02-29 | Disposition: A | Payer: MEDICAID | Attending: Emergency Medicine | Admitting: Emergency Medicine

## 2024-02-28 ENCOUNTER — Emergency Department (HOSPITAL_COMMUNITY): Payer: MEDICAID

## 2024-02-28 ENCOUNTER — Other Ambulatory Visit: Payer: Self-pay

## 2024-02-28 ENCOUNTER — Encounter (HOSPITAL_COMMUNITY): Payer: Self-pay | Admitting: Emergency Medicine

## 2024-02-28 DIAGNOSIS — R609 Edema, unspecified: Secondary | ICD-10-CM

## 2024-02-28 DIAGNOSIS — N186 End stage renal disease: Secondary | ICD-10-CM | POA: Insufficient documentation

## 2024-02-28 DIAGNOSIS — Z992 Dependence on renal dialysis: Secondary | ICD-10-CM | POA: Insufficient documentation

## 2024-02-28 DIAGNOSIS — R0602 Shortness of breath: Secondary | ICD-10-CM | POA: Insufficient documentation

## 2024-02-28 DIAGNOSIS — Z7982 Long term (current) use of aspirin: Secondary | ICD-10-CM | POA: Insufficient documentation

## 2024-02-28 DIAGNOSIS — R0989 Other specified symptoms and signs involving the circulatory and respiratory systems: Secondary | ICD-10-CM | POA: Insufficient documentation

## 2024-02-28 DIAGNOSIS — R600 Localized edema: Secondary | ICD-10-CM | POA: Insufficient documentation

## 2024-02-28 LAB — CBC
HCT: 43.3 % (ref 36.0–46.0)
Hemoglobin: 14 g/dL (ref 12.0–15.0)
MCH: 26.2 pg (ref 26.0–34.0)
MCHC: 32.3 g/dL (ref 30.0–36.0)
MCV: 80.9 fL (ref 80.0–100.0)
Platelets: 134 10*3/uL — ABNORMAL LOW (ref 150–400)
RBC: 5.35 MIL/uL — ABNORMAL HIGH (ref 3.87–5.11)
RDW: 15 % (ref 11.5–15.5)
WBC: 9.3 10*3/uL (ref 4.0–10.5)
nRBC: 0 % (ref 0.0–0.2)

## 2024-02-28 LAB — BASIC METABOLIC PANEL WITH GFR
Anion gap: 12 (ref 5–15)
BUN: 41 mg/dL — ABNORMAL HIGH (ref 6–20)
CO2: 20 mmol/L — ABNORMAL LOW (ref 22–32)
Calcium: 9 mg/dL (ref 8.9–10.3)
Chloride: 106 mmol/L (ref 98–111)
Creatinine, Ser: 4.02 mg/dL — ABNORMAL HIGH (ref 0.44–1.00)
GFR, Estimated: 13 mL/min — ABNORMAL LOW
Glucose, Bld: 328 mg/dL — ABNORMAL HIGH (ref 70–99)
Potassium: 4.7 mmol/L (ref 3.5–5.1)
Sodium: 138 mmol/L (ref 135–145)

## 2024-02-28 LAB — TROPONIN T, HIGH SENSITIVITY
Troponin T High Sensitivity: 185 ng/L (ref 0–19)
Troponin T High Sensitivity: 165 ng/L (ref 0–19)

## 2024-02-28 LAB — HEPATITIS B SURFACE ANTIGEN: Hepatitis B Surface Ag: NONREACTIVE

## 2024-02-28 MED ORDER — FUROSEMIDE 10 MG/ML IJ SOLN
80.0000 mg | Freq: Once | INTRAMUSCULAR | Status: AC
  Administered 2024-02-28: 80 mg via INTRAVENOUS
  Filled 2024-02-28 (×2): qty 8

## 2024-02-28 MED ORDER — OXYCODONE HCL 5 MG PO TABS
5.0000 mg | ORAL_TABLET | Freq: Once | ORAL | Status: AC
  Administered 2024-02-28: 5 mg via ORAL
  Filled 2024-02-28: qty 1

## 2024-02-28 MED ORDER — ATORVASTATIN CALCIUM 40 MG PO TABS: 40.0000 mg | ORAL_TABLET | Freq: Every day | ORAL | Status: DC

## 2024-02-28 MED ORDER — CHLORHEXIDINE GLUCONATE CLOTH 2 % EX PADS: 6.0000 | MEDICATED_PAD | Freq: Every day | CUTANEOUS | Status: DC

## 2024-02-28 MED ORDER — ALBUTEROL SULFATE HFA 108 (90 BASE) MCG/ACT IN AERS: 2.0000 | INHALATION_SPRAY | Freq: Four times a day (QID) | RESPIRATORY_TRACT | Status: DC | PRN

## 2024-02-28 MED ORDER — ACETAMINOPHEN 325 MG PO TABS
650.0000 mg | ORAL_TABLET | Freq: Four times a day (QID) | ORAL | Status: DC | PRN
  Administered 2024-02-29: 650 mg via ORAL

## 2024-02-28 MED ORDER — ONDANSETRON 4 MG PO TBDP
4.0000 mg | ORAL_TABLET | Freq: Three times a day (TID) | ORAL | Status: DC | PRN
  Administered 2024-02-29: 4 mg via ORAL
  Filled 2024-02-28: qty 1

## 2024-02-28 MED ORDER — METOPROLOL SUCCINATE ER 25 MG PO TB24
12.5000 mg | ORAL_TABLET | Freq: Every day | ORAL | Status: DC
  Administered 2024-02-28: 12.5 mg via ORAL
  Filled 2024-02-28: qty 1

## 2024-02-28 MED ORDER — GABAPENTIN 300 MG PO CAPS
300.0000 mg | ORAL_CAPSULE | Freq: Two times a day (BID) | ORAL | Status: DC
  Administered 2024-02-28: 300 mg via ORAL
  Filled 2024-02-28: qty 1

## 2024-02-28 MED ORDER — AMLODIPINE BESYLATE 5 MG PO TABS
10.0000 mg | ORAL_TABLET | Freq: Every day | ORAL | Status: DC
  Administered 2024-02-28: 10 mg via ORAL
  Filled 2024-02-28: qty 2

## 2024-02-28 MED ORDER — MONTELUKAST SODIUM 10 MG PO TABS
10.0000 mg | ORAL_TABLET | Freq: Every day | ORAL | Status: DC
  Administered 2024-02-28: 10 mg via ORAL
  Filled 2024-02-28: qty 1

## 2024-02-28 MED ORDER — ASPIRIN 81 MG PO TBEC: 81.0000 mg | DELAYED_RELEASE_TABLET | Freq: Every day | ORAL | Status: DC

## 2024-02-28 MED ORDER — PANTOPRAZOLE SODIUM 40 MG PO TBEC: 40.0000 mg | DELAYED_RELEASE_TABLET | Freq: Every day | ORAL | Status: DC

## 2024-02-28 NOTE — Care Management (Signed)
 Transition of Care [facility]) - Emergency Department Mini Assessment   Patient Details  Name: Sara [name] MRN: [id number] Date of Birth: 10/15/1975  Transition of Care [facility]) CM/SW Contact:    Corean JAYSON Canary, RN Phone Number: 02/28/2024, 5:39 PM   Clinical Narrative:  Patient is a new dialysis patient. Did not go Friday d/t transportation. Presented with [facility] chest pain  back pain. Called [facility] to speak to someone, however there was no answer, the patient should be on the schedule, however she is unsure of transportation due to weather.  Messaged with nursing and provider to let them know.  ED Mini Assessment: What brought you to the Emergency Department? : dialysis                Patient Contact and Communications        ,                 Admission diagnosis:  cp Patient Active Problem List   Diagnosis Date Noted   Hyperglycemia 01/22/2024   Noncompliance 01/22/2024   Suicidal ideation 01/22/2024   ESRD (end stage renal disease) (HCC) 01/22/2024   Type 2 diabetes mellitus with hyperosmolar nonketotic hyperglycemia (HCC) 12/31/2023   Dyslipidemia 12/31/2023   GERD without esophagitis 12/31/2023   Acute kidney injury superimposed on CKD 12/31/2023   Acute bronchitis 10/06/2023   CAP (community acquired pneumonia) 10/06/2023   AKI (acute kidney injury) 10/06/2023   Normocytic anemia 10/06/2023   Type II diabetes mellitus (HCC) 10/06/2023   (HFpEF) heart failure with preserved ejection fraction (HCC) 10/06/2023   Essential hypertension 10/06/2023   Hyperlipidemia 10/06/2023   Sleep apnea 10/06/2023   Tobacco use disorder 10/06/2023   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   Moderate cocaine use disorder (HCC) 10/14/2022   Cannabis use disorder 10/14/2022   Major depressive disorder, recurrent severe without psychotic features (HCC) 01/29/2022   Helicobacter pylori gastritis 11/28/2013   Nausea with vomiting 08/05/2013   Abdominal pain  08/05/2013   PCP:  Lenon Homer, MD Pharmacy:   [facility] [place], [place] - [phone] [place] [address] Phone: [phone] Fax: [phone]

## 2024-02-28 NOTE — ED Provider Notes (Signed)
 " [place] [facility] AT [facility] Provider Note   CSN: [id number] Arrival date & time: 02/28/24  1438     Patient presents with: No chief complaint on file.   Sara [name] is a 49 y.o. female w/ ESRD on dialysis x 1 month, here with edema, SOB worsening over past 2 weeks. She reports normally goes MWF to dialysis but only went once last week and once the week before due to inclement weather.  She lives in [place], the roads are bad from ice and snow.   She feels SOB, putting on weight, swelling in legs and arms and back.     HPI     Prior to Admission medications  Medication Sig Start Date End Date Taking? Authorizing Provider  acetaminophen  (TYLENOL ) 500 MG tablet Take 500 mg by mouth every 6 (six) hours as needed for moderate pain (pain score 4-6).   Yes [provider]  acetaminophen  (TYLENOL ) 650 MG CR tablet Take 650-1,300 mg by mouth daily as needed for pain.   Yes [provider]  albuterol  (VENTOLIN  HFA) 108 (90 Base) MCG/ACT inhaler Inhale 2 puffs into the lungs every 6 (six) hours as needed for wheezing or shortness of breath. 01/22/24  Yes Lenon Marien CROME, MD  amLODipine  (NORVASC ) 10 MG tablet Take 1 tablet (10 mg total) by mouth daily. 01/22/24  Yes Lenon Marien CROME, MD  aspirin  EC 81 MG tablet Take 1 tablet (81 mg total) by mouth daily. Swallow whole. 01/23/24  Yes Lenon Marien CROME, MD  atorvastatin  (LIPITOR) 40 MG tablet Take 1 tablet (40 mg total) by mouth at bedtime. 01/22/24 02/28/24 Yes Lenon Marien CROME, MD  bisacodyl  (DULCOLAX) 5 MG EC tablet Take 5 mg by mouth daily as needed for moderate constipation.   Yes [provider]  diphenhydramine -acetaminophen  (TYLENOL  PM) 25-500 MG TABS tablet Take 1 tablet by mouth at bedtime as needed (pain and sleep).   Yes [provider]  gabapentin  (NEURONTIN ) 300 MG capsule Take 300 mg by mouth 2 (two) times daily.   Yes [provider]   metoprolol  succinate (TOPROL -XL) 25 MG 24 hr tablet Take 0.5 tablets (12.5 mg total) by mouth daily. 01/23/24  Yes Lenon Marien CROME, MD  montelukast  (SINGULAIR ) 10 MG tablet Take 1 tablet (10 mg total) by mouth at bedtime. 01/22/24  Yes Lenon Marien CROME, MD  omeprazole  (PRILOSEC) 40 MG capsule Take 40 mg by mouth daily.   Yes [provider]  polyethylene glycol (MIRALAX  / GLYCOLAX ) 17 g packet Take 17 g by mouth daily.   Yes [provider]    Allergies: Amoxicillin, Biaxin  [clarithromycin ], Penicillins, Tetracyclines & related, Vibramycin [doxycycline], Flagyl  [metronidazole ], and Red dye #40 (allura red)    Review of Systems  Updated Vital Signs BP (!) 179/79   Pulse (!) 105   Temp 98.1 F (36.7 C) (Oral)   Resp (!) 35   SpO2 100%   Physical Exam Constitutional:      General: She is not in acute distress. HENT:     Head: Normocephalic and atraumatic.  Eyes:     Conjunctiva/sclera: Conjunctivae normal.     Pupils: Pupils are equal, round, and reactive to light.  Cardiovascular:     Rate and Rhythm: Normal rate and regular rhythm.  Pulmonary:     Effort: Pulmonary effort is normal. No respiratory distress.     Comments: Mild rhonchi lower lobes, speaking in full sentences Abdominal:     General: There  is no distension.     Tenderness: There is no abdominal tenderness.  Musculoskeletal:     Right lower leg: Edema present.     Left lower leg: Edema present.  Skin:    General: Skin is warm and dry.  Neurological:     General: No focal deficit present.     Mental Status: She is alert. Mental status is at baseline.  Psychiatric:        Mood and Affect: Mood normal.        Behavior: Behavior normal.     (all labs ordered are listed, but only abnormal results are displayed) Labs Reviewed  BASIC METABOLIC PANEL WITH GFR - Abnormal; Notable for the following components:      Result Value   CO2 20 (*)    Glucose, Bld 328 (*)    BUN 41 (*)     Creatinine, Ser 4.02 (*)    GFR, Estimated 13 (*)    All other components within normal limits  CBC - Abnormal; Notable for the following components:   RBC 5.35 (*)    Platelets 134 (*)    All other components within normal limits  TROPONIN T, HIGH SENSITIVITY - Abnormal; Notable for the following components:   Troponin T High Sensitivity 185 (*)    All other components within normal limits  TROPONIN T, HIGH SENSITIVITY - Abnormal; Notable for the following components:   Troponin T High Sensitivity 165 (*)    All other components within normal limits  HEPATITIS B SURFACE ANTIGEN  HEPATITIS B SURFACE ANTIBODY, QUANTITATIVE    EKG: EKG Interpretation Date/Time:  Sunday February 28 2024 14:48:21 EST Ventricular Rate:  98 PR Interval:  124 QRS Duration:  82 QT Interval:  346 QTC Calculation: 442 R Axis:   74  Text Interpretation: Sinus rhythm Probable left atrial enlargement Anterior infarct, old Confirmed by Cottie Cough [phone]) on 02/28/2024 3:09:27 PM  Radiology: DG Chest 2 View Result Date: 02/28/2024 CLINICAL DATA:  Chest pain and shortness of breath. EXAM: CHEST - 2 VIEW COMPARISON:  12/21/2023, CT chest 10/06/2023. FINDINGS: Right IJ central line tip is at the SVC RA junction. Trachea is midline. Heart size normal. Lungs are clear. No pleural fluid. IMPRESSION: No acute findings. Electronically Signed   By: Newell Eke M.D.   On: 02/28/2024 15:40     Procedures   Medications Ordered in the ED  oxyCODONE  (Oxy IR/ROXICODONE ) immediate release tablet 5 mg (has no administration in time range)  furosemide  (LASIX ) injection 80 mg (has no administration in time range)  atorvastatin  (LIPITOR) tablet 40 mg (has no administration in time range)  aspirin  EC tablet 81 mg (has no administration in time range)  amLODipine  (NORVASC ) tablet 10 mg (has no administration in time range)  albuterol  (VENTOLIN  HFA) 108 (90 Base) MCG/ACT inhaler 2 puff (has no administration in time  range)  acetaminophen  (TYLENOL ) tablet 650 mg (has no administration in time range)  gabapentin  (NEURONTIN ) capsule 300 mg (has no administration in time range)  metoprolol  succinate (TOPROL -XL) 24 hr tablet 12.5 mg (has no administration in time range)  montelukast  (SINGULAIR ) tablet 10 mg (has no administration in time range)  pantoprazole  (PROTONIX ) EC tablet 40 mg (has no administration in time range)  Chlorhexidine  Gluconate Cloth 2 % PADS 6 each (has no administration in time range)    Clinical Course as of 02/28/24 1954  Sun Feb 28, 2024  1625 Trop elevation may be chronic and related to CKD - no prior  levels for comparison.  We'll repeat the troponin to trend it.  ECG per my interpretation shows NSR without acute ischemia, and no significant changes from prior Dec ecg [MT]    Clinical Course User Index [MT] Cottie Donnice PARAS, MD                                 Medical Decision Making Amount and/or Complexity of Data Reviewed Labs: ordered. Radiology: ordered.  Risk OTC drugs. Prescription drug management.   This patient presents to the ED with concern for SOB, edema. This involves an extensive number of treatment options, and is a complaint that carries with it a high risk of complications and morbidity.  The differential diagnosis includes pulm edema vs CHF vs PNA vs viral URI vs anemia vs other  Co-morbidities that complicate the patient evaluation: ESRD  Additional history obtained from EMS  I ordered and personally interpreted labs.  The pertinent results include: No emergent findings.  Patient is chronically disease, no hyperkalemia.  Troponins are mildly elevated but flat on repeat, likely related to chronic kidney disease and poor renal clearance  I ordered imaging studies including dg chest I independently visualized and interpreted imaging which showed no emergent findings I agree with the radiologist interpretation  The patient was maintained on a cardiac  monitor.  I personally viewed and interpreted the cardiac monitored which showed an underlying rhythm of: Sinus rhythm  Per my interpretation the patient's ECG shows sinus rhythm with no acute ischemia  I ordered medication including IV Lasix  for diuresis per discussion with nephrology, Percocet for chronic low back pain, home medications reordered   Test Considered: Low suspicion for acute PE, or sepsis, no indication for angiogram imaging  I requested consultation with the nephrology,  and discussed lab and imaging findings as well as pertinent plan - they recommend: Diuresis overnight, they are not able to provide dialysis with the patient tonight but can attempt to do it tomorrow in the hospital.  This is just a temporizing measure in the setting of this abnormal snowstorm and poor road clearance, to ensure that the patient is thereafter able to get to her regular outpatient dialysis later this week.  After the interventions noted above, I reevaluated the patient and found that they have: improved   The patient be boarding in the emergency department overnight.  She is aware that she is boarding in the ED to get dialysis done tomorrow and she is comfortable with this plan.  Home medications have been ordered.  She appears comfortable and is eating dinner at the time of disposition     Final diagnoses:  Edema, unspecified type    ED Discharge Orders     None          Geneieve Duell, Donnice PARAS, MD 02/28/24 1954  "

## 2024-02-28 NOTE — ED Triage Notes (Signed)
 [facility]. Dialysis MWF.  Only made it on Wed. D/t trransportation.  Also HA and Back pain.  All pain x approx. 28 hrs  196/88, HR 98-107, 99% RA, 98.9, Lung sounds clear

## 2024-02-29 MED ORDER — PENTAFLUOROPROP-TETRAFLUOROETH EX AERO: 1.0000 | INHALATION_SPRAY | CUTANEOUS | Status: DC | PRN

## 2024-02-29 MED ORDER — LIDOCAINE-PRILOCAINE 2.5-2.5 % EX CREA: 1.0000 | TOPICAL_CREAM | CUTANEOUS | Status: DC | PRN

## 2024-02-29 MED ORDER — ACETAMINOPHEN 325 MG PO TABS
ORAL_TABLET | ORAL | Status: AC
  Filled 2024-02-29: qty 2

## 2024-02-29 MED ORDER — HEPARIN SODIUM (PORCINE) 1000 UNIT/ML DIALYSIS
1000.0000 [IU] | INTRAMUSCULAR | Status: DC | PRN
  Administered 2024-02-29: 3200 [IU]
  Filled 2024-02-29: qty 1

## 2024-02-29 MED ORDER — ALBUTEROL SULFATE (2.5 MG/3ML) 0.083% IN NEBU: 2.5000 mg | INHALATION_SOLUTION | Freq: Four times a day (QID) | RESPIRATORY_TRACT | Status: DC | PRN

## 2024-02-29 MED ORDER — ACETAMINOPHEN 500 MG PO TABS: 1000.0000 mg | ORAL_TABLET | Freq: Once | ORAL | Status: DC

## 2024-02-29 MED ORDER — ALTEPLASE 2 MG IJ SOLR: 2.0000 mg | Freq: Once | INTRAMUSCULAR | Status: DC | PRN

## 2024-02-29 MED ORDER — ANTICOAGULANT SODIUM CITRATE 4% (200MG/5ML) IV SOLN: 5.0000 mL | Status: DC | PRN

## 2024-02-29 MED ORDER — LIDOCAINE HCL (PF) 1 % IJ SOLN: 5.0000 mL | INTRAMUSCULAR | Status: DC | PRN

## 2024-02-29 NOTE — Procedures (Signed)
 Asked to see this patient for hospital dialysis. Patient couldn't get to dialysis, her transportation company didn't come to pick her up. Labs and exam in ED were wnl for esrd per ED MD.  The plan will be for ED HD. Pt is not to be admitted at this time. Pt will go to the dialysis unit when they are ready for the patient. When dialysis is completed pt will be sent back to ED for reassessment.   Vitals:   02/29/24 0930 02/29/24 1000 02/29/24 1030 02/29/24 1100  BP: (!) 150/85 139/74 131/68 124/65  Pulse: 87 91 88 88  Resp: 16 19 14 18   Temp:      TempSrc:      SpO2: 100% 97% 99% 98%  Weight:           I was present at the procedure, reviewed the HD regimen and made appropriate changes.   Myer Fret MD  [facility] 02/29/2024, 11:16 AM    Recent Labs  Lab 02/28/24 1502  HGB 14.0  CALCIUM  9.0  CREATININE 4.02*  K 4.7   No results for input(s): IRON , TIBC, FERRITIN in the last 168 hours. Inpatient medications:  amLODipine   10 mg Oral Daily   aspirin  EC  81 mg Oral Daily   atorvastatin   40 mg Oral QHS   Chlorhexidine  Gluconate Cloth  6 each Topical Q0600   gabapentin   300 mg Oral BID   metoprolol  succinate  12.5 mg Oral Daily   montelukast   10 mg Oral QHS   pantoprazole   40 mg Oral Daily    anticoagulant sodium citrate      acetaminophen , albuterol , alteplase , anticoagulant sodium citrate , heparin , lidocaine  (PF), lidocaine -prilocaine , ondansetron , pentafluoroprop-tetrafluoroeth

## 2024-02-29 NOTE — ED Notes (Addendum)
 Pt noted to be very upset. Pt looking for a ride home but is having difficulty.  Pt c/o about not eating all day, but has refused everything we have to offer.

## 2024-02-29 NOTE — Progress Notes (Signed)
" °   02/29/24 1209  Vitals  Temp 98.4 F (36.9 C)  Pulse Rate 85  Resp 15  BP 134/79  SpO2 100 %  O2 Device Room Air  Weight 73.3 kg  Type of Weight Post-Dialysis  Post Treatment  Dialyzer Clearance Lightly streaked  Hemodialysis Intake (mL) 200 mL  Liters Processed 84  Fluid Removed (mL) 2300 mL  Tolerated HD Treatment Yes     02/29/24 1209  Vitals  Temp 98.4 F (36.9 C)  Pulse Rate 85  Resp 15  BP 134/79  SpO2 100 %  O2 Device Room Air  Weight 73.3 kg  Type of Weight Post-Dialysis  Post Treatment  Dialyzer Clearance Lightly streaked  Hemodialysis Intake (mL) 200 mL  Liters Processed 84  Fluid Removed (mL) 2300 mL  Tolerated HD Treatment Yes   Received patient in bed to unit.  Alert and oriented.  Informed consent signed and in chart.   TX duration:3.5  Patient tolerated well.  Transported back to the room  Alert, without acute distress.  Hand-off given to patient's nurse.   Access used: [email or web address] Access issues: no complications  Total UF removed: 2300 Medication(s) given: tylenol  po x 1   [name] "

## 2024-02-29 NOTE — Inpatient Diabetes Management (Signed)
 Inpatient Diabetes Program Recommendations  AACE/ADA: New Consensus Statement on Inpatient Glycemic Control   Target Ranges:  Prepandial:   less than 140 mg/dL      Peak postprandial:   less than 180 mg/dL (1-2 hours)      Critically ill patients:  140 - 180 mg/dL   Lab Results  Component Value Date   GLUCAP 96 01/22/2024   HGBA1C 8.9 (H) 01/22/2024    Latest Reference Range & Units 02/28/24 15:02  Glucose 70 - 99 mg/dL 671 (H)   Review of Glycemic Control  Diabetes history: DM2  Outpatient Diabetes medications: None listed   Noted recent hospital admission (12/29/23-01/22/24) patient ws discharged on Glargine 5 units daily.   Current orders for Inpatient glycemic control: None  Inpatient Diabetes Program Recommendations:   Please consider:  - Novolog  0-6 units Q4HRS   Thanks,  Lavanda Search, RN, MSN, [facility]  Inpatient Diabetes Coordinator  Pager [phone] (8a-5p)

## 2024-03-01 ENCOUNTER — Ambulatory Visit (INDEPENDENT_AMBULATORY_CARE_PROVIDER_SITE_OTHER): Payer: MEDICAID | Admitting: Physician Assistant

## 2024-03-01 ENCOUNTER — Ambulatory Visit (HOSPITAL_COMMUNITY): Admission: RE | Admit: 2024-03-01 | Discharge: 2024-03-01 | Payer: MEDICAID | Attending: Vascular Surgery

## 2024-03-01 VITALS — BP 153/88 | Temp 97.3°F | Ht 65.0 in | Wt 165.6 lb

## 2024-03-01 DIAGNOSIS — N186 End stage renal disease: Secondary | ICD-10-CM

## 2024-03-01 DIAGNOSIS — G5611 Other lesions of median nerve, right upper limb: Secondary | ICD-10-CM

## 2024-03-02 LAB — HEPATITIS B SURFACE ANTIBODY, QUANTITATIVE: Hep B S AB Quant (Post): <3.5 m[IU]/mL — ABNORMAL LOW

## 2024-04-05 ENCOUNTER — Ambulatory Visit: Payer: MEDICAID | Admitting: Vascular Surgery
